# Patient Record
Sex: Male | Born: 1940
Health system: Southern US, Community
[De-identification: ages and names within clinical notes are randomized; demographics above are authoritative.]

## PROBLEM LIST (undated history)

## (undated) DIAGNOSIS — I351 Nonrheumatic aortic (valve) insufficiency: Secondary | ICD-10-CM

## (undated) DIAGNOSIS — I3139 Other pericardial effusion (noninflammatory): Secondary | ICD-10-CM

## (undated) DIAGNOSIS — I519 Heart disease, unspecified: Secondary | ICD-10-CM

## (undated) DIAGNOSIS — R9430 Abnormal result of cardiovascular function study, unspecified: Secondary | ICD-10-CM

## (undated) DIAGNOSIS — I428 Other cardiomyopathies: Secondary | ICD-10-CM

## (undated) DIAGNOSIS — I272 Pulmonary hypertension, unspecified: Secondary | ICD-10-CM

## (undated) DIAGNOSIS — I5022 Chronic systolic (congestive) heart failure: Secondary | ICD-10-CM

## (undated) DIAGNOSIS — IMO0002 Reserved for concepts with insufficient information to code with codable children: Secondary | ICD-10-CM

## (undated) DIAGNOSIS — I452 Bifascicular block: Secondary | ICD-10-CM

## (undated) DIAGNOSIS — I340 Nonrheumatic mitral (valve) insufficiency: Secondary | ICD-10-CM

## (undated) DIAGNOSIS — E119 Type 2 diabetes mellitus without complications: Secondary | ICD-10-CM

## (undated) DIAGNOSIS — E785 Hyperlipidemia, unspecified: Secondary | ICD-10-CM

## (undated) DIAGNOSIS — I77810 Thoracic aortic ectasia: Secondary | ICD-10-CM

## (undated) DIAGNOSIS — N183 Chronic kidney disease, stage 3 (moderate): Secondary | ICD-10-CM

## (undated) DIAGNOSIS — D696 Thrombocytopenia, unspecified: Secondary | ICD-10-CM

## (undated) DIAGNOSIS — I493 Ventricular premature depolarization: Secondary | ICD-10-CM

## (undated) DIAGNOSIS — I313 Pericardial effusion (noninflammatory): Secondary | ICD-10-CM

## (undated) HISTORY — DX: Other pericardial effusion (noninflammatory): I31.39

## (undated) HISTORY — DX: Nonrheumatic aortic (valve) insufficiency: I35.1

## (undated) HISTORY — DX: Ventricular premature depolarization: I49.3

## (undated) HISTORY — DX: Hyperlipidemia, unspecified: E78.5

## (undated) HISTORY — DX: Thrombocytopenia, unspecified: D69.6

## (undated) HISTORY — DX: Heart disease, unspecified: I51.9

## (undated) HISTORY — DX: Bifascicular block: I45.2

## (undated) HISTORY — DX: Gilbert syndrome: E80.4

## (undated) HISTORY — DX: Thoracic aortic ectasia: I77.810

## (undated) HISTORY — DX: Pericardial effusion (noninflammatory): I31.3

## (undated) HISTORY — DX: Nonrheumatic mitral (valve) insufficiency: I34.0

## (undated) HISTORY — DX: Reserved for concepts with insufficient information to code with codable children: IMO0002

## (undated) HISTORY — DX: Type 2 diabetes mellitus without complications: E11.9

## (undated) HISTORY — DX: Abnormal result of cardiovascular function study, unspecified: R94.30

---

## 1997-07-17 ENCOUNTER — Other Ambulatory Visit: Admission: RE | Admit: 1997-07-17 | Discharge: 1997-07-17 | Payer: Self-pay | Admitting: *Deleted

## 1997-09-10 ENCOUNTER — Ambulatory Visit (HOSPITAL_BASED_OUTPATIENT_CLINIC_OR_DEPARTMENT_OTHER): Admission: RE | Admit: 1997-09-10 | Discharge: 1997-09-10 | Payer: Self-pay | Admitting: Urology

## 1999-01-27 ENCOUNTER — Ambulatory Visit (HOSPITAL_COMMUNITY): Admission: RE | Admit: 1999-01-27 | Discharge: 1999-01-27 | Payer: Self-pay | Admitting: Cardiology

## 1999-09-02 ENCOUNTER — Encounter: Payer: Self-pay | Admitting: Rheumatology

## 1999-09-02 ENCOUNTER — Encounter: Admission: RE | Admit: 1999-09-02 | Discharge: 1999-09-02 | Payer: Self-pay | Admitting: Urology

## 1999-09-26 ENCOUNTER — Emergency Department (HOSPITAL_COMMUNITY): Admission: EM | Admit: 1999-09-26 | Discharge: 1999-09-26 | Payer: Self-pay | Admitting: Emergency Medicine

## 1999-09-26 ENCOUNTER — Encounter: Payer: Self-pay | Admitting: Emergency Medicine

## 2000-10-07 ENCOUNTER — Emergency Department (HOSPITAL_COMMUNITY): Admission: EM | Admit: 2000-10-07 | Discharge: 2000-10-07 | Payer: Self-pay | Admitting: Emergency Medicine

## 2000-10-07 ENCOUNTER — Encounter: Payer: Self-pay | Admitting: Emergency Medicine

## 2000-10-15 ENCOUNTER — Emergency Department (HOSPITAL_COMMUNITY): Admission: EM | Admit: 2000-10-15 | Discharge: 2000-10-15 | Payer: Self-pay | Admitting: Physical Therapy

## 2002-11-05 ENCOUNTER — Emergency Department (HOSPITAL_COMMUNITY): Admission: EM | Admit: 2002-11-05 | Discharge: 2002-11-05 | Payer: Self-pay | Admitting: Emergency Medicine

## 2002-11-24 ENCOUNTER — Encounter: Payer: Self-pay | Admitting: Urology

## 2002-11-28 ENCOUNTER — Inpatient Hospital Stay (HOSPITAL_COMMUNITY): Admission: RE | Admit: 2002-11-28 | Discharge: 2002-11-29 | Payer: Self-pay | Admitting: Urology

## 2003-05-14 ENCOUNTER — Encounter: Admission: RE | Admit: 2003-05-14 | Discharge: 2003-08-12 | Payer: Self-pay | Admitting: Rheumatology

## 2003-12-05 ENCOUNTER — Ambulatory Visit (HOSPITAL_COMMUNITY): Admission: RE | Admit: 2003-12-05 | Discharge: 2003-12-05 | Payer: Self-pay | Admitting: Gastroenterology

## 2011-05-21 DIAGNOSIS — Z79899 Other long term (current) drug therapy: Secondary | ICD-10-CM | POA: Diagnosis not present

## 2011-06-10 DIAGNOSIS — N453 Epididymo-orchitis: Secondary | ICD-10-CM | POA: Diagnosis not present

## 2011-06-10 DIAGNOSIS — N401 Enlarged prostate with lower urinary tract symptoms: Secondary | ICD-10-CM | POA: Diagnosis not present

## 2011-06-10 DIAGNOSIS — R300 Dysuria: Secondary | ICD-10-CM | POA: Diagnosis not present

## 2011-06-10 DIAGNOSIS — N3000 Acute cystitis without hematuria: Secondary | ICD-10-CM | POA: Diagnosis not present

## 2011-06-24 DIAGNOSIS — E7800 Pure hypercholesterolemia, unspecified: Secondary | ICD-10-CM | POA: Diagnosis not present

## 2011-06-24 DIAGNOSIS — I4949 Other premature depolarization: Secondary | ICD-10-CM | POA: Diagnosis not present

## 2011-06-24 DIAGNOSIS — I428 Other cardiomyopathies: Secondary | ICD-10-CM | POA: Diagnosis not present

## 2011-06-24 DIAGNOSIS — I10 Essential (primary) hypertension: Secondary | ICD-10-CM | POA: Diagnosis not present

## 2011-07-15 DIAGNOSIS — N453 Epididymo-orchitis: Secondary | ICD-10-CM | POA: Diagnosis not present

## 2011-09-18 DIAGNOSIS — Z79899 Other long term (current) drug therapy: Secondary | ICD-10-CM | POA: Diagnosis not present

## 2012-01-04 DIAGNOSIS — E7800 Pure hypercholesterolemia, unspecified: Secondary | ICD-10-CM | POA: Diagnosis not present

## 2012-01-04 DIAGNOSIS — Z1331 Encounter for screening for depression: Secondary | ICD-10-CM | POA: Diagnosis not present

## 2012-01-04 DIAGNOSIS — Z23 Encounter for immunization: Secondary | ICD-10-CM | POA: Diagnosis not present

## 2012-01-04 DIAGNOSIS — Z79899 Other long term (current) drug therapy: Secondary | ICD-10-CM | POA: Diagnosis not present

## 2012-01-04 DIAGNOSIS — I10 Essential (primary) hypertension: Secondary | ICD-10-CM | POA: Diagnosis not present

## 2012-01-27 DIAGNOSIS — I10 Essential (primary) hypertension: Secondary | ICD-10-CM | POA: Diagnosis not present

## 2012-01-27 DIAGNOSIS — E119 Type 2 diabetes mellitus without complications: Secondary | ICD-10-CM | POA: Diagnosis not present

## 2012-02-15 DIAGNOSIS — E119 Type 2 diabetes mellitus without complications: Secondary | ICD-10-CM | POA: Diagnosis not present

## 2012-02-15 DIAGNOSIS — I10 Essential (primary) hypertension: Secondary | ICD-10-CM | POA: Diagnosis not present

## 2012-03-01 DIAGNOSIS — N529 Male erectile dysfunction, unspecified: Secondary | ICD-10-CM | POA: Diagnosis not present

## 2012-03-01 DIAGNOSIS — E119 Type 2 diabetes mellitus without complications: Secondary | ICD-10-CM | POA: Diagnosis not present

## 2012-03-01 DIAGNOSIS — I10 Essential (primary) hypertension: Secondary | ICD-10-CM | POA: Diagnosis not present

## 2012-06-21 DIAGNOSIS — I10 Essential (primary) hypertension: Secondary | ICD-10-CM | POA: Diagnosis not present

## 2012-06-21 DIAGNOSIS — Z79899 Other long term (current) drug therapy: Secondary | ICD-10-CM | POA: Diagnosis not present

## 2012-06-21 DIAGNOSIS — E119 Type 2 diabetes mellitus without complications: Secondary | ICD-10-CM | POA: Diagnosis not present

## 2012-06-21 DIAGNOSIS — E782 Mixed hyperlipidemia: Secondary | ICD-10-CM | POA: Diagnosis not present

## 2012-06-22 DIAGNOSIS — I10 Essential (primary) hypertension: Secondary | ICD-10-CM | POA: Diagnosis not present

## 2012-06-22 DIAGNOSIS — I452 Bifascicular block: Secondary | ICD-10-CM | POA: Diagnosis not present

## 2012-06-22 DIAGNOSIS — I428 Other cardiomyopathies: Secondary | ICD-10-CM | POA: Diagnosis not present

## 2012-06-22 DIAGNOSIS — E7800 Pure hypercholesterolemia, unspecified: Secondary | ICD-10-CM | POA: Diagnosis not present

## 2012-06-22 DIAGNOSIS — I4949 Other premature depolarization: Secondary | ICD-10-CM | POA: Diagnosis not present

## 2012-07-19 DIAGNOSIS — N320 Bladder-neck obstruction: Secondary | ICD-10-CM | POA: Diagnosis not present

## 2012-08-11 DIAGNOSIS — A5900 Urogenital trichomoniasis, unspecified: Secondary | ICD-10-CM | POA: Diagnosis not present

## 2012-08-18 DIAGNOSIS — A64 Unspecified sexually transmitted disease: Secondary | ICD-10-CM | POA: Diagnosis not present

## 2012-08-18 DIAGNOSIS — A568 Sexually transmitted chlamydial infection of other sites: Secondary | ICD-10-CM | POA: Diagnosis not present

## 2012-08-18 DIAGNOSIS — A539 Syphilis, unspecified: Secondary | ICD-10-CM | POA: Diagnosis not present

## 2012-08-18 DIAGNOSIS — Z202 Contact with and (suspected) exposure to infections with a predominantly sexual mode of transmission: Secondary | ICD-10-CM | POA: Diagnosis not present

## 2012-08-22 DIAGNOSIS — Z202 Contact with and (suspected) exposure to infections with a predominantly sexual mode of transmission: Secondary | ICD-10-CM | POA: Diagnosis not present

## 2012-08-22 DIAGNOSIS — A64 Unspecified sexually transmitted disease: Secondary | ICD-10-CM | POA: Diagnosis not present

## 2012-09-01 DIAGNOSIS — A64 Unspecified sexually transmitted disease: Secondary | ICD-10-CM | POA: Diagnosis not present

## 2012-12-28 DIAGNOSIS — Z79899 Other long term (current) drug therapy: Secondary | ICD-10-CM | POA: Diagnosis not present

## 2012-12-28 DIAGNOSIS — Z0000 Encounter for general adult medical examination without abnormal findings: Secondary | ICD-10-CM | POA: Diagnosis not present

## 2012-12-28 DIAGNOSIS — Z1331 Encounter for screening for depression: Secondary | ICD-10-CM | POA: Diagnosis not present

## 2012-12-28 DIAGNOSIS — E782 Mixed hyperlipidemia: Secondary | ICD-10-CM | POA: Diagnosis not present

## 2012-12-28 DIAGNOSIS — R011 Cardiac murmur, unspecified: Secondary | ICD-10-CM | POA: Diagnosis not present

## 2012-12-28 DIAGNOSIS — I10 Essential (primary) hypertension: Secondary | ICD-10-CM | POA: Diagnosis not present

## 2012-12-28 DIAGNOSIS — E119 Type 2 diabetes mellitus without complications: Secondary | ICD-10-CM | POA: Diagnosis not present

## 2013-01-03 DIAGNOSIS — R011 Cardiac murmur, unspecified: Secondary | ICD-10-CM | POA: Diagnosis not present

## 2013-02-01 ENCOUNTER — Encounter (HOSPITAL_COMMUNITY): Payer: Self-pay | Admitting: Cardiology

## 2013-02-13 ENCOUNTER — Encounter: Payer: Self-pay | Admitting: Cardiology

## 2013-02-13 DIAGNOSIS — Z23 Encounter for immunization: Secondary | ICD-10-CM | POA: Diagnosis not present

## 2013-02-13 DIAGNOSIS — M5430 Sciatica, unspecified side: Secondary | ICD-10-CM | POA: Diagnosis not present

## 2013-04-11 DIAGNOSIS — N433 Hydrocele, unspecified: Secondary | ICD-10-CM | POA: Diagnosis not present

## 2013-04-11 DIAGNOSIS — Z202 Contact with and (suspected) exposure to infections with a predominantly sexual mode of transmission: Secondary | ICD-10-CM | POA: Diagnosis not present

## 2013-05-12 DIAGNOSIS — N509 Disorder of male genital organs, unspecified: Secondary | ICD-10-CM | POA: Diagnosis not present

## 2013-06-21 ENCOUNTER — Ambulatory Visit: Payer: Self-pay | Admitting: Cardiology

## 2013-08-14 DIAGNOSIS — I359 Nonrheumatic aortic valve disorder, unspecified: Secondary | ICD-10-CM | POA: Diagnosis not present

## 2013-08-14 DIAGNOSIS — I10 Essential (primary) hypertension: Secondary | ICD-10-CM | POA: Diagnosis not present

## 2013-08-14 DIAGNOSIS — E782 Mixed hyperlipidemia: Secondary | ICD-10-CM | POA: Diagnosis not present

## 2013-08-14 DIAGNOSIS — Z79899 Other long term (current) drug therapy: Secondary | ICD-10-CM | POA: Diagnosis not present

## 2013-08-14 DIAGNOSIS — E119 Type 2 diabetes mellitus without complications: Secondary | ICD-10-CM | POA: Diagnosis not present

## 2014-02-22 ENCOUNTER — Ambulatory Visit: Payer: Self-pay | Admitting: Cardiology

## 2014-02-22 DIAGNOSIS — Z1389 Encounter for screening for other disorder: Secondary | ICD-10-CM | POA: Diagnosis not present

## 2014-02-22 DIAGNOSIS — E119 Type 2 diabetes mellitus without complications: Secondary | ICD-10-CM | POA: Diagnosis not present

## 2014-02-22 DIAGNOSIS — Z79899 Other long term (current) drug therapy: Secondary | ICD-10-CM | POA: Diagnosis not present

## 2014-02-22 DIAGNOSIS — D696 Thrombocytopenia, unspecified: Secondary | ICD-10-CM | POA: Diagnosis not present

## 2014-02-22 DIAGNOSIS — Z0000 Encounter for general adult medical examination without abnormal findings: Secondary | ICD-10-CM | POA: Diagnosis not present

## 2014-02-22 DIAGNOSIS — E780 Pure hypercholesterolemia: Secondary | ICD-10-CM | POA: Diagnosis not present

## 2014-02-22 DIAGNOSIS — Z23 Encounter for immunization: Secondary | ICD-10-CM | POA: Diagnosis not present

## 2014-02-22 DIAGNOSIS — I351 Nonrheumatic aortic (valve) insufficiency: Secondary | ICD-10-CM | POA: Diagnosis not present

## 2014-02-22 DIAGNOSIS — I10 Essential (primary) hypertension: Secondary | ICD-10-CM | POA: Diagnosis not present

## 2014-03-05 ENCOUNTER — Ambulatory Visit (INDEPENDENT_AMBULATORY_CARE_PROVIDER_SITE_OTHER): Payer: Medicare Other | Admitting: Cardiology

## 2014-03-05 ENCOUNTER — Encounter: Payer: Self-pay | Admitting: Cardiology

## 2014-03-05 VITALS — BP 140/92 | HR 63 | Ht 71.0 in | Wt 190.0 lb

## 2014-03-05 DIAGNOSIS — I4510 Unspecified right bundle-branch block: Secondary | ICD-10-CM

## 2014-03-05 DIAGNOSIS — E785 Hyperlipidemia, unspecified: Secondary | ICD-10-CM

## 2014-03-05 DIAGNOSIS — I77810 Thoracic aortic ectasia: Secondary | ICD-10-CM | POA: Diagnosis not present

## 2014-03-05 DIAGNOSIS — I493 Ventricular premature depolarization: Secondary | ICD-10-CM

## 2014-03-05 DIAGNOSIS — I10 Essential (primary) hypertension: Secondary | ICD-10-CM

## 2014-03-05 DIAGNOSIS — I452 Bifascicular block: Secondary | ICD-10-CM

## 2014-03-05 DIAGNOSIS — I445 Left posterior fascicular block: Secondary | ICD-10-CM

## 2014-03-05 DIAGNOSIS — I351 Nonrheumatic aortic (valve) insufficiency: Secondary | ICD-10-CM

## 2014-03-05 NOTE — Patient Instructions (Signed)
The current medical regimen is effective;  continue present plan and medications.  Your physician has requested that you have an echocardiogram. Echocardiography is a painless test that uses sound waves to create images of your heart. It provides your doctor with information about the size and shape of your heart and how well your heart's chambers and valves are working. This procedure takes approximately one hour. There are no restrictions for this procedure.  Follow up in 1 year with Dr. Marlou Porch.  You will receive a letter in the mail 2 months before you are due.  Please call us when you receive this letter to schedule your follow up appointment.  Thank you for choosing [facility]!!

## 2014-03-05 NOTE — Progress Notes (Signed)
[place]. [address], [address] [place], [place]  [address] Phone: [phone] Fax:  [phone]  Date:  03/05/2014   ID:  WARFIELD FICKE, DOB [birth year], MRN [id number]  PCP:  [name], MD   History of Present Illness: Carlos Gibson is a 73 y.o. male with diabetes, hyperlipidemia, history of frequent PVCs with mild to moderate aortic insufficiency here for follow-up. Echocardiogram performed on 01/03/13 demonstrated ejection fraction of 50-55% with mildly calcified aortic valve and mild to moderate aortic valve regurgitation with aortic root dilatation of 4 cm. Mild diastolic dysfunction was noted. He has been diagnosed in the past with Gilbert's syndrome and mild thrombocytopenia. He also has a bifascicular block, right bundle branch block and left posterior fascicular block.  Ejection fraction by MUGA on 07/19/08 was 55-60%.  Worked prior at [employer]. Feels PVC, rare. No syncope.    Wt Readings from Last 3 Encounters:  03/05/14 190 lb (86.183 kg)     No past medical history on file.  No past surgical history on file.  Current Outpatient Prescriptions  Medication Sig Dispense Refill  . alfuzosin (UROXATRAL) 10 MG 24 hr tablet Take 10 mg by mouth daily.  3  . atorvastatin (LIPITOR) 20 MG tablet Take 20 mg by mouth daily.  8  . finasteride (PROSCAR) 5 MG tablet   2  . lisinopril (PRINIVIL,ZESTRIL) 5 MG tablet     . metFORMIN (GLUCOPHAGE) 500 MG tablet Take 1,000 mg by mouth 2 (two) times daily.  3   No current facility-administered medications for this visit.    Allergies:   No Known Allergies  Social History:  The patient  reports that he has never smoked. He does not have any smokeless tobacco history on file.   Family History  Problem Relation Age of Onset  . Hypertension Mother   . Irregular heart beat Father     ROS:  Please see the history of present illness.   Denies any fevers, chills, orthopnea, PND. Positive for occasional palpitations. No  shortness of breath.   All other systems reviewed and negative.   PHYSICAL EXAM: VS:  BP 140/92 mmHg  Pulse 63  Ht 5\' 11"  (1.803 m)  Wt 190 lb (86.183 kg)  BMI 26.51 kg/m2 Well nourished, well developed, in no acute distress HEENT: normal, /AT, EOMI Neck: no JVD, normal carotid upstroke, no bruit Cardiac:  normal S1, S2; RRR; 1/6 diastolic murmurRight upper sternal border Lungs:  clear to auscultation bilaterally, no wheezing, rhonchi or rales Abd: soft, nontender, no hepatomegaly, no bruits Ext: no edema, 2+ distal pulses Skin: warm and dry GU: deferred Neuro: no focal abnormalities noted, AAO x 3  EKG:  03/05/14-sinus rhythm, 63, right bundle branch block, left posterior fascicular block (negative axis lead 1, positive aVR, positive inferior leads, nonspecific ST-T wave changes.  ASSESSMENT AND PLAN:  1. Aortic regurgitation-previously in the moderate range. Diastolic murmur heard today on exam. No symptoms of significant shortness of breath. Occasional atypical sharp chest discomfort. I will check an echocardiogram to monitor his aortic valve regurgitation. 2. Dilated aortic root-previously 4 cm. Checking echocardiogram. Continuing with blood pressure control. 3. Hypertension-mildly elevated today however last week with Dr. Felipa Eth was within normal range he states. Continue with low-dose ACE inhibitor. Monitor. 4. PVC-heard today on exam. Ectopy. Asymptomatic except for occasional palpitations felt at night when quiet. Overall, no high risk symptoms. 5. Hyperlipidemia-tolerating statin medication well. Stated that Dr. Felipa Eth noted excellent lipids. 6. Right bundle  branch block/left posterior fascicular block-watch for any signs of syncope. Be careful with AV nodal blocking agents.  Signed, [name], MD [facility]  03/05/2014 8:50 AM

## 2014-03-08 ENCOUNTER — Ambulatory Visit (HOSPITAL_COMMUNITY): Payer: Medicare Other | Attending: Cardiology

## 2014-03-08 ENCOUNTER — Other Ambulatory Visit: Payer: Self-pay

## 2014-03-08 DIAGNOSIS — I359 Nonrheumatic aortic valve disorder, unspecified: Secondary | ICD-10-CM | POA: Diagnosis not present

## 2014-03-08 DIAGNOSIS — I351 Nonrheumatic aortic (valve) insufficiency: Secondary | ICD-10-CM

## 2014-03-08 DIAGNOSIS — I10 Essential (primary) hypertension: Secondary | ICD-10-CM | POA: Diagnosis not present

## 2014-03-08 DIAGNOSIS — E785 Hyperlipidemia, unspecified: Secondary | ICD-10-CM | POA: Insufficient documentation

## 2014-03-08 NOTE — Progress Notes (Signed)
2D Echo completed. 03/08/2014 

## 2014-03-13 ENCOUNTER — Telehealth: Payer: Self-pay | Admitting: Cardiology

## 2014-03-13 NOTE — Telephone Encounter (Signed)
New problem   Pt returning call concerning results. Please call pt.

## 2014-03-13 NOTE — Telephone Encounter (Signed)
Pt given results of echo done 03/08/14

## 2014-04-09 DIAGNOSIS — H18413 Arcus senilis, bilateral: Secondary | ICD-10-CM | POA: Diagnosis not present

## 2014-04-09 DIAGNOSIS — E119 Type 2 diabetes mellitus without complications: Secondary | ICD-10-CM | POA: Diagnosis not present

## 2014-04-09 DIAGNOSIS — H43811 Vitreous degeneration, right eye: Secondary | ICD-10-CM | POA: Diagnosis not present

## 2014-04-09 DIAGNOSIS — H2513 Age-related nuclear cataract, bilateral: Secondary | ICD-10-CM | POA: Diagnosis not present

## 2014-06-12 DIAGNOSIS — R3913 Splitting of urinary stream: Secondary | ICD-10-CM | POA: Diagnosis not present

## 2014-06-12 DIAGNOSIS — R351 Nocturia: Secondary | ICD-10-CM | POA: Diagnosis not present

## 2014-06-12 DIAGNOSIS — N401 Enlarged prostate with lower urinary tract symptoms: Secondary | ICD-10-CM | POA: Diagnosis not present

## 2014-08-23 DIAGNOSIS — N400 Enlarged prostate without lower urinary tract symptoms: Secondary | ICD-10-CM | POA: Diagnosis not present

## 2014-08-23 DIAGNOSIS — D696 Thrombocytopenia, unspecified: Secondary | ICD-10-CM | POA: Diagnosis not present

## 2014-08-23 DIAGNOSIS — E780 Pure hypercholesterolemia: Secondary | ICD-10-CM | POA: Diagnosis not present

## 2014-08-23 DIAGNOSIS — I10 Essential (primary) hypertension: Secondary | ICD-10-CM | POA: Diagnosis not present

## 2014-08-23 DIAGNOSIS — Z79899 Other long term (current) drug therapy: Secondary | ICD-10-CM | POA: Diagnosis not present

## 2014-08-23 DIAGNOSIS — E119 Type 2 diabetes mellitus without complications: Secondary | ICD-10-CM | POA: Diagnosis not present

## 2014-08-23 DIAGNOSIS — R002 Palpitations: Secondary | ICD-10-CM | POA: Diagnosis not present

## 2014-08-27 DIAGNOSIS — Z79899 Other long term (current) drug therapy: Secondary | ICD-10-CM | POA: Diagnosis not present

## 2014-08-27 DIAGNOSIS — D696 Thrombocytopenia, unspecified: Secondary | ICD-10-CM | POA: Diagnosis not present

## 2014-08-27 DIAGNOSIS — E119 Type 2 diabetes mellitus without complications: Secondary | ICD-10-CM | POA: Diagnosis not present

## 2014-08-30 DIAGNOSIS — Z113 Encounter for screening for infections with a predominantly sexual mode of transmission: Secondary | ICD-10-CM | POA: Diagnosis not present

## 2014-08-30 DIAGNOSIS — L245 Irritant contact dermatitis due to other chemical products: Secondary | ICD-10-CM | POA: Diagnosis not present

## 2014-08-30 DIAGNOSIS — Z202 Contact with and (suspected) exposure to infections with a predominantly sexual mode of transmission: Secondary | ICD-10-CM | POA: Diagnosis not present

## 2014-09-07 DIAGNOSIS — L245 Irritant contact dermatitis due to other chemical products: Secondary | ICD-10-CM | POA: Diagnosis not present

## 2014-10-03 DIAGNOSIS — I10 Essential (primary) hypertension: Secondary | ICD-10-CM | POA: Diagnosis not present

## 2014-10-03 DIAGNOSIS — I351 Nonrheumatic aortic (valve) insufficiency: Secondary | ICD-10-CM | POA: Diagnosis not present

## 2014-10-23 ENCOUNTER — Other Ambulatory Visit: Payer: Self-pay | Admitting: Geriatric Medicine

## 2014-10-23 ENCOUNTER — Ambulatory Visit (HOSPITAL_COMMUNITY): Payer: Medicare Other | Attending: Cardiology

## 2014-10-23 ENCOUNTER — Other Ambulatory Visit: Payer: Self-pay

## 2014-10-23 DIAGNOSIS — I313 Pericardial effusion (noninflammatory): Secondary | ICD-10-CM | POA: Diagnosis not present

## 2014-10-23 DIAGNOSIS — I340 Nonrheumatic mitral (valve) insufficiency: Secondary | ICD-10-CM | POA: Insufficient documentation

## 2014-10-23 DIAGNOSIS — I351 Nonrheumatic aortic (valve) insufficiency: Secondary | ICD-10-CM | POA: Insufficient documentation

## 2014-10-24 ENCOUNTER — Ambulatory Visit
Admission: RE | Admit: 2014-10-24 | Discharge: 2014-10-24 | Disposition: A | Discharge status: Home / Self Care | Payer: Medicare Other | Source: Ambulatory Visit | Attending: Internal Medicine | Admitting: Internal Medicine

## 2014-10-24 ENCOUNTER — Other Ambulatory Visit: Payer: Self-pay | Admitting: Internal Medicine

## 2014-10-24 DIAGNOSIS — R0609 Other forms of dyspnea: Secondary | ICD-10-CM | POA: Diagnosis not present

## 2014-10-24 DIAGNOSIS — R0602 Shortness of breath: Secondary | ICD-10-CM | POA: Diagnosis not present

## 2014-10-24 DIAGNOSIS — R05 Cough: Secondary | ICD-10-CM | POA: Diagnosis not present

## 2014-11-05 DIAGNOSIS — I10 Essential (primary) hypertension: Secondary | ICD-10-CM | POA: Diagnosis not present

## 2014-11-05 DIAGNOSIS — I351 Nonrheumatic aortic (valve) insufficiency: Secondary | ICD-10-CM | POA: Diagnosis not present

## 2014-11-05 DIAGNOSIS — R0600 Dyspnea, unspecified: Secondary | ICD-10-CM | POA: Diagnosis not present

## 2014-11-05 DIAGNOSIS — I509 Heart failure, unspecified: Secondary | ICD-10-CM | POA: Diagnosis not present

## 2014-11-23 DIAGNOSIS — I5021 Acute systolic (congestive) heart failure: Secondary | ICD-10-CM | POA: Diagnosis not present

## 2014-11-23 DIAGNOSIS — E119 Type 2 diabetes mellitus without complications: Secondary | ICD-10-CM | POA: Diagnosis not present

## 2014-11-23 DIAGNOSIS — I10 Essential (primary) hypertension: Secondary | ICD-10-CM | POA: Diagnosis not present

## 2014-11-23 DIAGNOSIS — N400 Enlarged prostate without lower urinary tract symptoms: Secondary | ICD-10-CM | POA: Diagnosis not present

## 2014-11-23 DIAGNOSIS — Z79899 Other long term (current) drug therapy: Secondary | ICD-10-CM | POA: Diagnosis not present

## 2014-12-13 ENCOUNTER — Encounter: Payer: Self-pay | Admitting: *Deleted

## 2014-12-13 ENCOUNTER — Encounter: Payer: Self-pay | Admitting: Cardiology

## 2014-12-13 ENCOUNTER — Ambulatory Visit (INDEPENDENT_AMBULATORY_CARE_PROVIDER_SITE_OTHER): Payer: Medicare Other | Admitting: Cardiology

## 2014-12-13 ENCOUNTER — Telehealth: Payer: Self-pay | Admitting: Cardiology

## 2014-12-13 VITALS — BP 128/90 | HR 98 | Ht 71.0 in | Wt 180.4 lb

## 2014-12-13 DIAGNOSIS — I493 Ventricular premature depolarization: Secondary | ICD-10-CM

## 2014-12-13 DIAGNOSIS — I445 Left posterior fascicular block: Secondary | ICD-10-CM

## 2014-12-13 DIAGNOSIS — Z01818 Encounter for other preprocedural examination: Secondary | ICD-10-CM

## 2014-12-13 DIAGNOSIS — Z79899 Other long term (current) drug therapy: Secondary | ICD-10-CM | POA: Diagnosis not present

## 2014-12-13 DIAGNOSIS — I351 Nonrheumatic aortic (valve) insufficiency: Secondary | ICD-10-CM

## 2014-12-13 DIAGNOSIS — I10 Essential (primary) hypertension: Secondary | ICD-10-CM | POA: Diagnosis not present

## 2014-12-13 DIAGNOSIS — I452 Bifascicular block: Secondary | ICD-10-CM

## 2014-12-13 DIAGNOSIS — I4510 Unspecified right bundle-branch block: Secondary | ICD-10-CM

## 2014-12-13 DIAGNOSIS — I77810 Thoracic aortic ectasia: Secondary | ICD-10-CM | POA: Diagnosis not present

## 2014-12-13 DIAGNOSIS — I444 Left anterior fascicular block: Secondary | ICD-10-CM | POA: Insufficient documentation

## 2014-12-13 LAB — BASIC METABOLIC PANEL
BUN: 15 mg/dL (ref 6–23)
CHLORIDE: 104 meq/L (ref 96–112)
CO2: 29 meq/L (ref 19–32)
Calcium: 9.3 mg/dL (ref 8.4–10.5)
Creatinine, Ser: 1.26 mg/dL (ref 0.40–1.50)
GFR: 71.91 mL/min (ref >60.00)
GLUCOSE: 131 mg/dL — AB (ref 70–99)
Potassium: 3.9 mEq/L (ref 3.5–5.1)
SODIUM: 141 meq/L (ref 135–145)

## 2014-12-13 LAB — PROTIME-INR
INR: 1.1 ratio — AB (ref 0.8–1.0)
Prothrombin Time: 12.4 s (ref 9.6–13.1)

## 2014-12-13 LAB — CBC
HEMATOCRIT: 40.2 % (ref 39.0–52.0)
Hemoglobin: 13.3 g/dL (ref 13.0–17.0)
MCHC: 33 g/dL (ref 30.0–36.0)
MCV: 84.3 fl (ref 78.0–100.0)
Platelets: 129 10*3/uL — ABNORMAL LOW (ref 150.0–400.0)
RBC: 4.77 Mil/uL (ref 4.22–5.81)
RDW: 14.1 % (ref 11.5–15.5)
WBC: 4.2 10*3/uL (ref 4.0–10.5)

## 2014-12-13 MED ORDER — CARVEDILOL 3.125 MG PO TABS: 3.1250 mg | ORAL_TABLET | Freq: Two times a day (BID) | ORAL | Status: DC

## 2014-12-13 MED ORDER — LOSARTAN POTASSIUM 25 MG PO TABS: 25.0000 mg | ORAL_TABLET | Freq: Every day | ORAL | Status: DC

## 2014-12-13 NOTE — Patient Instructions (Signed)
Medication Instructions:  Please stop your Lisinopril. Start Losartan 25 mg once a day and Carvedilol 3.125 mg twice a day. continue all other medications as listed.  Labwork: Please have blood work today (CBC, BMP and PT/INR)  Testing/Procedures: Your physician has requested that you have a cardiac catheterization. Cardiac catheterization is used to diagnose and/or treat various heart conditions. Doctors may recommend this procedure for a number of different reasons. The most common reason is to evaluate chest pain. Chest pain can be a symptom of coronary artery disease (CAD), and cardiac catheterization can show whether plaque is narrowing or blocking your heart's arteries. This procedure is also used to evaluate the valves, as well as measure the blood flow and oxygen levels in different parts of your heart. For further information please visit [email or web address]. Please follow instruction sheet, as given.  Follow-Up: Will be scheduled after your cardiac cath.  Thank you for choosing [facility]!!

## 2014-12-13 NOTE — Addendum Note (Signed)
Addended by: Jerline Pain on: 12/13/2014 09:42 AM   Modules accepted: Orders

## 2014-12-13 NOTE — Telephone Encounter (Signed)
New message      Pt was seen today and forgot to ask if he should continue taking lasix.  Please call

## 2014-12-13 NOTE — Telephone Encounter (Signed)
PT  AWARE./CY 

## 2014-12-13 NOTE — Progress Notes (Addendum)
[place]. [address] [place], [place]  [address] Phone: [phone] Fax:  [phone]  Date:  12/13/2014   ID:  Carlos Gibson, DOB Dec 07, 1940, MRN [id number]  PCP:  [name], MD   History of Present Illness: Carlos Gibson is a 74 y.o. male with diabetes, hyperlipidemia, history of frequent PVCs with mild to moderate aortic insufficiency here for follow-up and discovery of her ejection fraction 40% on echocardiogram 10/23/14. Previous Echocardiogram performed on 01/03/13 demonstrated ejection fraction of 50-55% with mildly calcified aortic valve and mild to moderate aortic valve regurgitation with aortic root dilatation of 4 cm. Mild diastolic dysfunction was noted.  Cough started again after restarting lisinopril.  Wheezing if lay on side. No problem laying flat.  No CP.   He has been diagnosed in the past with Gilbert's syndrome and mild thrombocytopenia. He also has a bifascicular block, right bundle branch block and left posterior fascicular block.  Ejection fraction by MUGA on 07/19/08 was 55-60%.  Worked prior at [employer]. Feels PVC, rare. No syncope.   ECHO 10/23/14:  - Left ventricle: The cavity size was severely dilated. Wall thickness was increased in a pattern of mild LVH. Systolic function was moderately reduced. The estimated ejection fraction was 40%. - Aortic valve: There was mild regurgitation. - Mitral valve: There was mild to moderate regurgitation. - Left atrium: The atrium was severely dilated. - Right atrium: The atrium was mildly dilated. - Pulmonary arteries: PA peak pressure: 60 mm Hg (S). - Pericardium, extracardiac: A trivial pericardial effusion was identified.   Wt Readings from Last 3 Encounters:  12/13/14 180 lb 6.4 oz (81.829 kg)  03/05/14 190 lb (86.183 kg)     No past medical history on file.  No past surgical history on file.  Current Outpatient Prescriptions  Medication Sig Dispense Refill  . alfuzosin  (UROXATRAL) 10 MG 24 hr tablet Take 10 mg by mouth daily.  3  . aspirin 81 MG tablet Take 81 mg by mouth daily.    [name] atorvastatin (LIPITOR) 20 MG tablet Take 20 mg by mouth daily.  8  . calcium-vitamin D (OSCAL WITH D) 500-200 MG-UNIT per tablet Take 1 tablet by mouth.    . finasteride (PROSCAR) 5 MG tablet   2  . lisinopril (PRINIVIL,ZESTRIL) 5 MG tablet     . metFORMIN (GLUCOPHAGE) 500 MG tablet Take 1,000 mg by mouth 2 (two) times daily.  3   No current facility-administered medications for this visit.    Allergies:   No Known Allergies  Social History:  The patient  reports that he has never smoked. He does not have any smokeless tobacco history on file.   Family History  Problem Relation Age of Onset  . Hypertension Mother   . Irregular heart beat Father     ROS:  Please see the history of present illness.   Denies any fevers, chills, orthopnea, PND. Positive for occasional palpitations. No shortness of breath.   All other systems reviewed and negative.   PHYSICAL EXAM: VS:  BP 128/90 mmHg  Pulse 98  Ht 5\' 11"  (1.803 m)  Wt 180 lb 6.4 oz (81.829 kg)  BMI 25.17 kg/m2  SpO2 96% Well nourished, well developed, in no acute distress HEENT: normal, [place]/AT, EOMI Neck: no JVD, normal carotid upstroke, no bruit Cardiac:  normal S1, S2; RRR; 1/6 diastolic murmurRight upper sternal border Lungs:  clear to auscultation bilaterally, no wheezing, rhonchi or rales Abd: soft, nontender,  no hepatomegaly, no bruits Ext: no edema, 2+ distal pulses, 2+ radial pulse Skin: warm and dry GU: deferred Neuro: no focal abnormalities noted, AAO x 3  EKG:  08/23/14-sinus rhythm with PACs, PVCs, left anterior fascicular block, right bundle branch block, nonspecific ST-T wave changes-personally viewed-03/05/14-sinus rhythm, 63, right bundle branch block, left posterior fascicular block (negative axis lead 1, positive aVR, positive inferior leads, nonspecific ST-T wave changes.  ASSESSMENT AND  PLAN:  1. Cardiomyopathy, dilated, acute systolic heart failure - EF now 40% in 7/16. 8 pound fluid gain, nocturnal dyspnea, cough, BNP was elevated at 1410. After Lasix administration, he had improvement of his symptoms according to Dr. Carlyle Lipa note. No symptoms of chest pain. His lisinopril was added back and cough returned. I will go ahead and stop the lisinopril and start losartan 25 g once a day. I will also add carvedilol 3.125 mg twice a day as a beta blocker given his cardiomyopathy. In the past, metoprolol had caused fatigue. In addition, given his reduction in ejection fraction, I will check cardiac catheterization, radial/brachial right and left heart catheterization to exclude coronary artery disease. He is not having any chest discomfort. For now, continue with Lasix 20 mg once a day. Risk of stroke, heart attack, death, renal impairment, bleeding were discussed. Patient wife present for discussion. Pulmonary pressures were estimated at 60 mmHg. We will check right heart catheterization.  2. Aortic regurgitation-previously in the moderate range. Diastolic murmur heard on exam. No symptoms of significant shortness of breath. Occasional atypical sharp chest discomfort. I will check an echocardiogram to monitor his aortic valve regurgitation. 3. Dilated aortic root-previously 4 cm.  Continuing with blood pressure control. 4. Hypertension-. Monitor. ARB because of ACE inhibitor cough, carvedilol. 5. PVC-Chronic. Ectopy. Asymptomatic except for occasional palpitations felt at night when quiet. Overall, no high risk symptoms. Starting carvedilol. Could this be also contribute into his cardiomyopathy? 6. Hyperlipidemia-tolerating statin medication well.  Dr. Felipa Eth following labs. 7. Right bundle branch block/left posterior fascicular block-watch for any signs of syncope. Be careful with AV nodal blocking agents.  Cath Lab Visit (complete for each Cath Lab visit)  Clinical Evaluation Leading  to the Procedure:   ACS: No.  Non-ACS:    Anginal Classification: CCS II  Anti-ischemic medical therapy: Minimal Therapy (1 class of medications)  Non-Invasive Test Results: No non-invasive testing performed  Prior CABG: No previous CABG    Signed, [name], MD [facility]  12/13/2014 9:03 AM

## 2014-12-13 NOTE — Telephone Encounter (Signed)
OK to continue - labs OK today

## 2014-12-20 ENCOUNTER — Ambulatory Visit (HOSPITAL_COMMUNITY)
Admission: RE | Admit: 2014-12-20 | Discharge: 2014-12-20 | Disposition: A | Discharge status: Home / Self Care | Payer: Medicare Other | Source: Ambulatory Visit | Attending: Cardiology | Admitting: Cardiology

## 2014-12-20 ENCOUNTER — Encounter (HOSPITAL_COMMUNITY)
Admission: RE | Disposition: A | Discharge status: Home / Self Care | Payer: Self-pay | Source: Ambulatory Visit | Attending: Cardiology

## 2014-12-20 DIAGNOSIS — I452 Bifascicular block: Secondary | ICD-10-CM | POA: Diagnosis not present

## 2014-12-20 DIAGNOSIS — I5023 Acute on chronic systolic (congestive) heart failure: Secondary | ICD-10-CM | POA: Diagnosis present

## 2014-12-20 DIAGNOSIS — I5021 Acute systolic (congestive) heart failure: Secondary | ICD-10-CM | POA: Diagnosis not present

## 2014-12-20 DIAGNOSIS — I10 Essential (primary) hypertension: Secondary | ICD-10-CM | POA: Insufficient documentation

## 2014-12-20 DIAGNOSIS — I429 Cardiomyopathy, unspecified: Secondary | ICD-10-CM

## 2014-12-20 DIAGNOSIS — E119 Type 2 diabetes mellitus without complications: Secondary | ICD-10-CM | POA: Insufficient documentation

## 2014-12-20 DIAGNOSIS — Z79899 Other long term (current) drug therapy: Secondary | ICD-10-CM

## 2014-12-20 DIAGNOSIS — E785 Hyperlipidemia, unspecified: Secondary | ICD-10-CM | POA: Insufficient documentation

## 2014-12-20 DIAGNOSIS — I351 Nonrheumatic aortic (valve) insufficiency: Secondary | ICD-10-CM | POA: Diagnosis not present

## 2014-12-20 DIAGNOSIS — I77810 Thoracic aortic ectasia: Secondary | ICD-10-CM | POA: Diagnosis present

## 2014-12-20 HISTORY — PX: CARDIAC CATHETERIZATION: SHX172

## 2014-12-20 LAB — POCT I-STAT 3, VENOUS BLOOD GAS (G3P V)
ACID-BASE DEFICIT: 2 mmol/L (ref 0.0–2.0)
Bicarbonate: 24 mEq/L (ref 20.0–24.0)
O2 Saturation: 58 %
PH VEN: 7.346 — AB (ref 7.250–7.300)
TCO2: 25 mmol/L (ref 0–100)
pCO2, Ven: 43.9 mmHg — ABNORMAL LOW (ref 45.0–50.0)
pO2, Ven: 32 mmHg (ref 30.0–45.0)

## 2014-12-20 LAB — POCT I-STAT 3, ART BLOOD GAS (G3+)
Acid-base deficit: 2 mmol/L (ref 0.0–2.0)
Bicarbonate: 22.4 mEq/L (ref 20.0–24.0)
O2 SAT: 97 %
PCO2 ART: 38.3 mmHg (ref 35.0–45.0)
PO2 ART: 96 mmHg (ref 80.0–100.0)
TCO2: 24 mmol/L (ref 0–100)
pH, Arterial: 7.375 (ref 7.350–7.450)

## 2014-12-20 LAB — GLUCOSE, CAPILLARY
GLUCOSE-CAPILLARY: 161 mg/dL — AB (ref 65–99)
GLUCOSE-CAPILLARY: 132 mg/dL — AB (ref 65–99)

## 2014-12-20 SURGERY — RIGHT/LEFT HEART CATH AND CORONARY ANGIOGRAPHY

## 2014-12-20 MED ORDER — HEPARIN SODIUM (PORCINE) 1000 UNIT/ML IJ SOLN
INTRAMUSCULAR | Status: AC
  Filled 2014-12-20: qty 1

## 2014-12-20 MED ORDER — SODIUM CHLORIDE 0.9 % IV SOLN: 250.0000 mL | INTRAVENOUS | Status: DC | PRN

## 2014-12-20 MED ORDER — FUROSEMIDE 20 MG PO TABS: 20.0000 mg | ORAL_TABLET | Freq: Every day | ORAL | Status: DC

## 2014-12-20 MED ORDER — MIDAZOLAM HCL 2 MG/2ML IJ SOLN
INTRAMUSCULAR | Status: AC
  Filled 2014-12-20: qty 4

## 2014-12-20 MED ORDER — SODIUM CHLORIDE 0.9 % IV SOLN
INTRAVENOUS | Status: DC
  Administered 2014-12-20: 09:00:00 via INTRAVENOUS

## 2014-12-20 MED ORDER — SODIUM CHLORIDE 0.9 % WEIGHT BASED INFUSION: 1.0000 mL/kg/h | INTRAVENOUS | Status: AC

## 2014-12-20 MED ORDER — VERAPAMIL HCL 2.5 MG/ML IV SOLN
INTRAVENOUS | Status: AC
  Filled 2014-12-20: qty 2

## 2014-12-20 MED ORDER — SODIUM CHLORIDE 0.9 % IJ SOLN: 3.0000 mL | Freq: Two times a day (BID) | INTRAMUSCULAR | Status: DC

## 2014-12-20 MED ORDER — HEPARIN (PORCINE) IN NACL 2-0.9 UNIT/ML-% IJ SOLN
INTRAMUSCULAR | Status: AC
  Filled 2014-12-20: qty 1000

## 2014-12-20 MED ORDER — ASPIRIN 81 MG PO CHEW: 81.0000 mg | CHEWABLE_TABLET | ORAL | Status: DC

## 2014-12-20 MED ORDER — LIDOCAINE HCL (PF) 1 % IJ SOLN
INTRAMUSCULAR | Status: AC
  Filled 2014-12-20: qty 30

## 2014-12-20 MED ORDER — MIDAZOLAM HCL 2 MG/2ML IJ SOLN
INTRAMUSCULAR | Status: DC | PRN
  Administered 2014-12-20: 2 mg via INTRAVENOUS

## 2014-12-20 MED ORDER — FENTANYL CITRATE (PF) 100 MCG/2ML IJ SOLN
INTRAMUSCULAR | Status: DC | PRN
  Administered 2014-12-20: 25 ug via INTRAVENOUS

## 2014-12-20 MED ORDER — VERAPAMIL HCL 2.5 MG/ML IV SOLN
INTRAVENOUS | Status: DC | PRN
  Administered 2014-12-20: 10:00:00 via INTRA_ARTERIAL

## 2014-12-20 MED ORDER — LIDOCAINE HCL (PF) 1 % IJ SOLN
INTRAMUSCULAR | Status: DC | PRN
  Administered 2014-12-20: 10:00:00

## 2014-12-20 MED ORDER — FENTANYL CITRATE (PF) 100 MCG/2ML IJ SOLN
INTRAMUSCULAR | Status: AC
  Filled 2014-12-20: qty 4

## 2014-12-20 MED ORDER — SODIUM CHLORIDE 0.9 % IJ SOLN: 3.0000 mL | INTRAMUSCULAR | Status: DC | PRN

## 2014-12-20 MED ORDER — IOHEXOL 350 MG/ML SOLN
INTRAVENOUS | Status: DC | PRN
  Administered 2014-12-20: 150 mL via INTRA_ARTERIAL

## 2014-12-20 SURGICAL SUPPLY — 17 items
CATH BALLN WEDGE 5F 110CM (CATHETERS) ×2 IMPLANT
CATH INFINITI 5FR ANG PIGTAIL (CATHETERS) ×3 IMPLANT
CATH INFINITI 5FR JL4 (CATHETERS) ×2 IMPLANT
CATH INFINITI JR4 5F (CATHETERS) ×3 IMPLANT
DEVICE RAD COMP TR BAND LRG (VASCULAR PRODUCTS) ×3 IMPLANT
GLIDESHEATH SLEND SS 6F .021 (SHEATH) ×3 IMPLANT
GUIDEWIRE .025 260CM (WIRE) ×2 IMPLANT
KIT HEART LEFT (KITS) ×3 IMPLANT
KIT HEART RIGHT NAMIC (KITS) ×3 IMPLANT
PACK CARDIAC CATHETERIZATION (CUSTOM PROCEDURE TRAY) ×3 IMPLANT
SHEATH FAST CATH BRACH 5F 5CM (SHEATH) ×2 IMPLANT
SHEATH PINNACLE 5F 10CM (SHEATH) ×2 IMPLANT
SYR MEDRAD MARK V 150ML (SYRINGE) ×3 IMPLANT
TRANSDUCER W/STOPCOCK (MISCELLANEOUS) ×4 IMPLANT
TUBING CIL FLEX 10 FLL-RA (TUBING) ×3 IMPLANT
WIRE HI TORQ VERSACORE-J 145CM (WIRE) ×2 IMPLANT
WIRE SAFE-T 1.5MM-J .035X260CM (WIRE) ×3 IMPLANT

## 2014-12-20 NOTE — H&P (View-Only) (Signed)
[place]. [address], [address] [place], [place]  [address] Phone: [phone] Fax:  [phone]  Date:  12/13/2014   ID:  Carlos Gibson, DOB Aug 13, 1940, MRN [id number]  PCP:  [name], MD   History of Present Illness: Carlos Gibson is a 74 y.o. male with diabetes, hyperlipidemia, history of frequent PVCs with mild to moderate aortic insufficiency here for follow-up and discovery of her ejection fraction 40% on echocardiogram 10/23/14. Previous Echocardiogram performed on 01/03/13 demonstrated ejection fraction of 50-55% with mildly calcified aortic valve and mild to moderate aortic valve regurgitation with aortic root dilatation of 4 cm. Mild diastolic dysfunction was noted.  Cough started again after restarting lisinopril.  Wheezing if lay on side. No problem laying flat.  No CP.   He has been diagnosed in the past with Gilbert's syndrome and mild thrombocytopenia. He also has a bifascicular block, right bundle branch block and left posterior fascicular block.  Ejection fraction by MUGA on 07/19/08 was 55-60%.  Worked prior at [employer]. Feels PVC, rare. No syncope.   ECHO 10/23/14:  - Left ventricle: The cavity size was severely dilated. Wall thickness was increased in a pattern of mild LVH. Systolic function was moderately reduced. The estimated ejection fraction was 40%. - Aortic valve: There was mild regurgitation. - Mitral valve: There was mild to moderate regurgitation. - Left atrium: The atrium was severely dilated. - Right atrium: The atrium was mildly dilated. - Pulmonary arteries: PA peak pressure: 60 mm Hg (S). - Pericardium, extracardiac: A trivial pericardial effusion was identified.   Wt Readings from Last 3 Encounters:  12/13/14 180 lb 6.4 oz (81.829 kg)  03/05/14 190 lb (86.183 kg)     No past medical history on file.  No past surgical history on file.  Current Outpatient Prescriptions  Medication Sig Dispense Refill  . alfuzosin  (UROXATRAL) 10 MG 24 hr tablet Take 10 mg by mouth daily.  3  . aspirin 81 MG tablet Take 81 mg by mouth daily.    [name] atorvastatin (LIPITOR) 20 MG tablet Take 20 mg by mouth daily.  8  . calcium-vitamin D (OSCAL WITH D) 500-200 MG-UNIT per tablet Take 1 tablet by mouth.    . finasteride (PROSCAR) 5 MG tablet   2  . lisinopril (PRINIVIL,ZESTRIL) 5 MG tablet     . metFORMIN (GLUCOPHAGE) 500 MG tablet Take 1,000 mg by mouth 2 (two) times daily.  3   No current facility-administered medications for this visit.    Allergies:   No Known Allergies  Social History:  The patient  reports that he has never smoked. He does not have any smokeless tobacco history on file.   Family History  Problem Relation Age of Onset  . Hypertension Mother   . Irregular heart beat Father     ROS:  Please see the history of present illness.   Denies any fevers, chills, orthopnea, PND. Positive for occasional palpitations. No shortness of breath.   All other systems reviewed and negative.   PHYSICAL EXAM: VS:  BP 128/90 mmHg  Pulse 98  Ht 5\' 11"  (1.803 m)  Wt 180 lb 6.4 oz (81.829 kg)  BMI 25.17 kg/m2  SpO2 96% Well nourished, well developed, in no acute distress HEENT: normal, [place]/AT, EOMI Neck: no JVD, normal carotid upstroke, no bruit Cardiac:  normal S1, S2; RRR; 1/6 diastolic murmurRight upper sternal border Lungs:  clear to auscultation bilaterally, no wheezing, rhonchi or rales Abd: soft, nontender,  no hepatomegaly, no bruits Ext: no edema, 2+ distal pulses, 2+ radial pulse Skin: warm and dry GU: deferred Neuro: no focal abnormalities noted, AAO x 3  EKG:  08/23/14-sinus rhythm with PACs, PVCs, left anterior fascicular block, right bundle branch block, nonspecific ST-T wave changes-personally viewed-03/05/14-sinus rhythm, 63, right bundle branch block, left posterior fascicular block (negative axis lead 1, positive aVR, positive inferior leads, nonspecific ST-T wave changes.  ASSESSMENT AND  PLAN:  1. Cardiomyopathy, dilated, acute systolic heart failure - EF now 40% in 7/16. 8 pound fluid gain, nocturnal dyspnea, cough, BNP was elevated at 1410. After Lasix administration, he had improvement of his symptoms according to Dr. Carlyle Lipa note. No symptoms of chest pain. His lisinopril was added back and cough returned. I will go ahead and stop the lisinopril and start losartan 25 g once a day. I will also add carvedilol 3.125 mg twice a day as a beta blocker given his cardiomyopathy. In the past, metoprolol had caused fatigue. In addition, given his reduction in ejection fraction, I will check cardiac catheterization, radial/brachial right and left heart catheterization to exclude coronary artery disease. He is not having any chest discomfort. For now, continue with Lasix 20 mg once a day. Risk of stroke, heart attack, death, renal impairment, bleeding were discussed. Patient wife present for discussion. Pulmonary pressures were estimated at 60 mmHg. We will check right heart catheterization.  2. Aortic regurgitation-previously in the moderate range. Diastolic murmur heard on exam. No symptoms of significant shortness of breath. Occasional atypical sharp chest discomfort. I will check an echocardiogram to monitor his aortic valve regurgitation. 3. Dilated aortic root-previously 4 cm.  Continuing with blood pressure control. 4. Hypertension-. Monitor. ARB because of ACE inhibitor cough, carvedilol. 5. PVC-Chronic. Ectopy. Asymptomatic except for occasional palpitations felt at night when quiet. Overall, no high risk symptoms. Starting carvedilol. Could this be also contribute into his cardiomyopathy? 6. Hyperlipidemia-tolerating statin medication well.  Dr. Felipa Eth following labs. 7. Right bundle branch block/left posterior fascicular block-watch for any signs of syncope. Be careful with AV nodal blocking agents.  Cath Lab Visit (complete for each Cath Lab visit)  Clinical Evaluation Leading  to the Procedure:   ACS: No.  Non-ACS:    Anginal Classification: CCS II  Anti-ischemic medical therapy: Minimal Therapy (1 class of medications)  Non-Invasive Test Results: No non-invasive testing performed  Prior CABG: No previous CABG    Signed, [name], MD [facility]  12/13/2014 9:03 AM

## 2014-12-20 NOTE — Progress Notes (Signed)
TR BAND REMOVAL  LOCATION:    right radial  DEFLATED PER PROTOCOL:    Yes.    TIME BAND OFF / DRESSING APPLIED:    1345   SITE UPON ARRIVAL:    Level 0  SITE AFTER BAND REMOVAL:    Level 0  CIRCULATION SENSATION AND MOVEMENT:    Within Normal Limits   Yes.    COMMENTS:   TRB REOMOVED/ TEGADERM DSG APPLIED

## 2014-12-20 NOTE — Progress Notes (Signed)
Site area: rt groin fa sheath Site Prior to Removal:  Level 0 Pressure Applied For:  20 minutes Manual:   yes Patient Status During Pull:  stable Post Pull Site:  Level  0 Post Pull Instructions Given:  yes Post Pull Pulses Present: yes Dressing Applied:  tegaderm Bedrest begins @  1150 Comments:  0

## 2014-12-20 NOTE — Progress Notes (Addendum)
Site area: right groin a 5 french arterial was removed  Site Prior to Removal:  Level 0  Pressure Applied For 25 MINUTES    Minutes Beginning at 1130a  Manual:   Yes.    Patient Status During Pull:  stable  Post Pull Groin Site:  Level 0  Post Pull Instructions Given:  Yes.    Post Pull Pulses Present:  Yes.    Dressing Applied:  Yes.    Comments:  VS remain stable during sheath pull.  Pt resting with eyes closed

## 2014-12-20 NOTE — Interval H&P Note (Signed)
History and Physical Interval Note:  12/20/2014 9:24 AM  Carlos Gibson  has presented today for surgery, with the diagnosis of cm  The various methods of treatment have been discussed with the patient and family. After consideration of risks, benefits and other options for treatment, the patient has consented to  Procedure(s): Right/Left Heart Cath and Coronary Angiography (N/A) as a surgical intervention .  The patient's history has been reviewed, patient examined, no change in status, stable for surgery.  I have reviewed the patient's chart and labs.  Questions were answered to the patient's satisfaction.     SKAINS, MARK

## 2014-12-20 NOTE — Discharge Instructions (Signed)
HOLD METFORMIN FOR 48 HOURS. RESUME ON SUN. MORNING    Arteriogram Care After These instructions give you information on caring for yourself after your procedure. Your doctor may also give you more specific instructions. Call your doctor if you have any problems or questions after your procedure. HOME CARE  Do not bathe, swim, or use a hot tub until directed by your doctor. You can shower.  Do not lift anything heavier than 10 pounds (about a gallon of milk) for 2 days.  Do not walk a lot, run, or drive for 2 days.  Return to normal activities in 2 days or as told by your doctor. Finding out the results of your test Ask when your test results will be ready. Make sure you get your test results. GET HELP RIGHT AWAY IF:   You have fever.  You have more pain in your leg.  The leg that was cut is:  Bleeding.  Puffy (swollen) or red.  Cold.  Pale or changes color.  Weak.  Tingly or numb. If you go to the Emergency Room, tell your nurse that you have had an arteriogram. Take this paper with you to show the nurse. MAKE SURE YOU:  Understand these instructions.  Will watch your condition.  Will get help right away if you are not doing well or get worse. Document Released: 06/19/2008 Document Revised: 03/28/2013 Document Reviewed: 06/19/2008 [facility] Patient Information 2015 [place], [place]. This information is not intended to replace advice given to you by your health care provider. Make sure you discuss any questions you have with your health care provider.     Radial Site Care Refer to this sheet in the next few weeks. These instructions provide you with information on caring for yourself after your procedure. Your caregiver may also give you more specific instructions. Your treatment has been planned according to current medical practices, but problems sometimes occur. Call your caregiver if you have any problems or questions after your procedure. HOME CARE  INSTRUCTIONS  You may shower the day after the procedure.Remove the bandage (dressing) and gently wash the site with plain soap and water.Gently pat the site dry.  Do not apply powder or lotion to the site.  Do not submerge the affected site in water for 3 to 5 days.  Inspect the site at least twice daily.  Do not flex or bend the affected arm for 24 hours.  No lifting over 5 pounds (2.3 kg) for 5 days after your procedure.  Do not drive home if you are discharged the same day of the procedure. Have someone else drive you.  You may drive 24 hours after the procedure unless otherwise instructed by your caregiver.  Do not operate machinery or power tools for 24 hours.  A responsible adult should be with you for the first 24 hours after you arrive home. What to expect:  Any bruising will usually fade within 1 to 2 weeks.  Blood that collects in the tissue (hematoma) may be painful to the touch. It should usually decrease in size and tenderness within 1 to 2 weeks. SEEK IMMEDIATE MEDICAL CARE IF:  You have unusual pain at the radial site.  You have redness, warmth, swelling, or pain at the radial site.  You have drainage (other than a small amount of blood on the dressing).  You have chills.  You have a fever or persistent symptoms for more than 72 hours.  You have a fever and your symptoms suddenly get worse.  Your arm becomes pale, cool, tingly, or numb.  You have heavy bleeding from the site. Hold pressure on the site. Document Released: 04/25/2010 Document Revised: 06/15/2011 Document Reviewed: 04/25/2010 [facility] Patient Information 2015 [place], [place]. This information is not intended to replace advice given to you by your health care provider. Make sure you discuss any questions you have with your health care provider.

## 2014-12-20 NOTE — Progress Notes (Signed)
Site area: right brachial  Sheath was removed   Site Prior to Removal:  Level 0  Pressure Applied For 15 MINUTES    Minutes Beginning at 1115a  Manual:   Yes.    Patient Status During Pull:  stable  Post Pull Groin Site:  Level 0  Post Pull Instructions Given:  Yes.    Post Pull Pulses Present:  Yes.    Dressing Applied:  Yes.    Comments:  VS remain stable during sheath pull. No complaints of discomfort at this time.

## 2014-12-20 NOTE — Progress Notes (Signed)
Site area: rt ac venous sheath Site Prior to Removal:  Level 0 Pressure Applied For:  10 minutes Manual:   yes Patient Status During Pull:  stable Post Pull Site:  Level  0 Post Pull Instructions Given:  yes Post Pull Pulses Present: yes Dressing Applied:  Small tegaderm Bedrest begins @  Comments:

## 2014-12-21 ENCOUNTER — Telehealth: Payer: Self-pay | Admitting: *Deleted

## 2014-12-21 ENCOUNTER — Other Ambulatory Visit: Payer: Self-pay | Admitting: Cardiology

## 2014-12-21 ENCOUNTER — Encounter (HOSPITAL_COMMUNITY): Payer: Self-pay | Admitting: Cardiology

## 2014-12-21 MED ORDER — POTASSIUM CHLORIDE CRYS ER 20 MEQ PO TBCR: 20.0000 meq | EXTENDED_RELEASE_TABLET | Freq: Every day | ORAL | Status: DC

## 2014-12-21 MED ORDER — FUROSEMIDE 40 MG PO TABS: 40.0000 mg | ORAL_TABLET | Freq: Two times a day (BID) | ORAL | Status: DC

## 2014-12-21 NOTE — Telephone Encounter (Signed)
Pt aware to increase his Furosemide to 40 mg twice a day for 3 days and to start potassium 20 MEQ daily as ordered by Dr Marlou Porch.  He was given an appt for Monday at 10:30 to follow up.

## 2014-12-21 NOTE — Telephone Encounter (Signed)
Just wanted to clarify that the patient was to take 40mg  bid for three days only. How should he be taking now? Please advise. Thanks, MI

## 2014-12-21 NOTE — Telephone Encounter (Signed)
Call received directly into triage from pt.  Pt reports he had cath yesterday and is continuing to have shortness of breath. States he did not sleep at all last night.  States medicine was going to be called in. Does not which medication.  States he has had this shortness of breath prior to seeing Dr. Marlou Porch in office but feels it may be a little worse today.  He is calling for treatment plan.  I told pt I would forward to Dr. Marlou Porch for his recommendations and we would call him back.  Call back number for pt is [phone]

## 2014-12-24 ENCOUNTER — Ambulatory Visit (INDEPENDENT_AMBULATORY_CARE_PROVIDER_SITE_OTHER): Payer: Medicare Other | Admitting: Cardiology

## 2014-12-24 VITALS — BP 132/72 | HR 96 | Ht 71.0 in | Wt 180.1 lb

## 2014-12-24 DIAGNOSIS — I428 Other cardiomyopathies: Secondary | ICD-10-CM

## 2014-12-24 DIAGNOSIS — I493 Ventricular premature depolarization: Secondary | ICD-10-CM | POA: Insufficient documentation

## 2014-12-24 DIAGNOSIS — I351 Nonrheumatic aortic (valve) insufficiency: Secondary | ICD-10-CM

## 2014-12-24 DIAGNOSIS — IMO0002 Reserved for concepts with insufficient information to code with codable children: Secondary | ICD-10-CM

## 2014-12-24 DIAGNOSIS — I77810 Thoracic aortic ectasia: Secondary | ICD-10-CM | POA: Insufficient documentation

## 2014-12-24 DIAGNOSIS — I4510 Unspecified right bundle-branch block: Secondary | ICD-10-CM

## 2014-12-24 DIAGNOSIS — I272 Other secondary pulmonary hypertension: Secondary | ICD-10-CM

## 2014-12-24 DIAGNOSIS — I429 Cardiomyopathy, unspecified: Secondary | ICD-10-CM | POA: Diagnosis not present

## 2014-12-24 DIAGNOSIS — I5021 Acute systolic (congestive) heart failure: Secondary | ICD-10-CM

## 2014-12-24 DIAGNOSIS — D696 Thrombocytopenia, unspecified: Secondary | ICD-10-CM | POA: Insufficient documentation

## 2014-12-24 DIAGNOSIS — I359 Nonrheumatic aortic valve disorder, unspecified: Secondary | ICD-10-CM | POA: Insufficient documentation

## 2014-12-24 DIAGNOSIS — I519 Heart disease, unspecified: Secondary | ICD-10-CM | POA: Insufficient documentation

## 2014-12-24 LAB — BASIC METABOLIC PANEL
BUN: 22 mg/dL (ref 6–23)
CALCIUM: 9.1 mg/dL (ref 8.4–10.5)
CO2: 27 mEq/L (ref 19–32)
Chloride: 104 mEq/L (ref 96–112)
Creatinine, Ser: 1.29 mg/dL (ref 0.40–1.50)
GFR: 69.97 mL/min (ref >60.00)
Glucose, Bld: 205 mg/dL — ABNORMAL HIGH (ref 70–99)
Potassium: 3.5 mEq/L (ref 3.5–5.1)
SODIUM: 141 meq/L (ref 135–145)

## 2014-12-24 LAB — TSH: TSH: 2.64 u[IU]/mL (ref 0.35–4.50)

## 2014-12-24 MED ORDER — FUROSEMIDE 40 MG PO TABS: 40.0000 mg | ORAL_TABLET | Freq: Two times a day (BID) | ORAL | Status: DC

## 2014-12-24 NOTE — Progress Notes (Signed)
Cardiology Office Note   Date:  12/24/2014   ID:  Carlos Gibson, DOB 12-24-1940, MRN [id number]  PCP:  [name], MD  Cardiologist:   [name], MD       History of Present Illness: Carlos Gibson is a 74 y.o. male who presents for systolic heart failure. Had cardiac catheterization which showed normal coronary arteries, nonischemic cardiomyopathy, ejection fraction in the 25% range. His left ventricular end-diastolic pressure was also elevated in the mid 20s. Pulmonary pressures were moderately elevated.  He has been having some shortness of breath.  He still having some cough. Some orthopnea. This is likely result of his nonischemic cardiomyopathy.  Continuing on Lasix as below.  On original presentation, his ejection fraction was found to be 40% in July 2016. 8 pounds fluid gain was noted. BNP was 1410. He had improvements after Lasix ministration according to Dr. Felipa Eth previously. Lisinopril was tried but ACE inhibitor cough was produced.  Back in 2014 his ejection fraction was low normal at 50-55%, aortic root was 4 cm. Has a history of PVCs. Previously worked at [employer].    Past Medical History  Diagnosis Date  . Bifascicular block   . Right BBB/left post fasc block   . Diabetes   . Hyperlipidemia   . Frequent PVCs   . Ejection fraction < 50%   . Aortic valve calcification   . Aortic valve regurgitation   . Aortic root dilation   . Mild diastolic dysfunction   . Gilbert's syndrome   . Thrombocytopenia   . Cardiomyopathy     Past Surgical History  Procedure Laterality Date  . Cardiac catheterization N/A 12/20/2014    Procedure: Right/Left Heart Cath and Coronary Angiography;  Surgeon: Jerline Pain, MD;  Location: [facility];  Service: Cardiovascular;  Laterality: N/A;     Current Outpatient Prescriptions  Medication Sig Dispense Refill  . alfuzosin (UROXATRAL) 10 MG 24 hr tablet Take 10 mg by mouth daily.  3  . aspirin EC 81 MG  tablet Take 81 mg by mouth daily.    [name] atorvastatin (LIPITOR) 20 MG tablet Take 20 mg by mouth daily.  8  . calcium-vitamin D (OSCAL WITH D) 500-200 MG-UNIT per tablet Take 1 tablet by mouth.    . carvedilol (COREG) 3.125 MG tablet Take 1 tablet (3.125 mg total) by mouth 2 (two) times daily. 60 tablet 6  . finasteride (PROSCAR) 5 MG tablet Take 5 mg by mouth daily.   2  . furosemide (LASIX) 40 MG tablet Take 1 tablet (40 mg total) by mouth 2 (two) times daily. 60 tablet 3  . losartan (COZAAR) 25 MG tablet Take 1 tablet (25 mg total) by mouth daily. 30 tablet 6  . metFORMIN (GLUCOPHAGE) 500 MG tablet Take 1,000 mg by mouth 2 (two) times daily.  3  . potassium chloride SA (K-DUR,KLOR-CON) 20 MEQ tablet Take 1 tablet (20 mEq total) by mouth daily. 30 tablet 6   No current facility-administered medications for this visit.    Allergies:   Ace inhibitors    Social History:  The patient  reports that he has never smoked. He does not have any smokeless tobacco history on file.   Family History:  The patient's family history includes Hypertension in his mother; Irregular heart beat in his father.    ROS:  Please see the history of present illness.   Otherwise, review of systems are positive for none.   All other systems  are reviewed and negative.    PHYSICAL EXAM: VS:  BP 132/72 mmHg  Pulse 96  Ht 5\' 11"  (1.803 m)  Wt 180 lb 1.9 oz (81.702 kg)  BMI 25.13 kg/m2  SpO2 94% , BMI Body mass index is 25.13 kg/(m^2). GEN: Well nourished, well developed, in no acute distress HEENT: normal Neck: no JVD, carotid bruits, or masses Cardiac: RRR; no murmurs, rubs, + S3 gallops, no edema  Respiratory:  clear to auscultation bilaterally, normal work of breathing GI: soft, nontender, nondistended, + BS MS: no deformity or atrophy Skin: warm and dry, no rash Neuro:  Strength and sensation are intact Psych: euthymic mood, full affect   EKG:  Not done today  CATH: 12/20/14  There is severe left  ventricular systolic dysfunction.  Ejection fraction between 25-30% with global hypokinesis  Moderately elevated pulmonary pressures-mean PA pressure 35 mmHg consistent with secondary pulmonary hypertension as a result of increased left ventricular end-diastolic pressure  No angiographically significant coronary artery disease present.  Nonischemic cardiomyopathy. Left ventricular ejection fraction 25-30%. Left ventricular end-diastolic pressure 24 mmHg. Cardiac output 3.7 L/m with cardiac index of 1.8.  We'll continue with aggressive medical management. Up titration of medications.   Recent Labs: 12/13/2014: BUN 15; Creatinine, Ser 1.26; Hemoglobin 13.3; Platelets 129.0*; Potassium 3.9; Sodium 141    Lipid Panel No results found for: CHOL, TRIG, HDL, CHOLHDL, VLDL, LDLCALC, LDLDIRECT    Wt Readings from Last 3 Encounters:  12/24/14 180 lb 1.9 oz (81.702 kg)  12/20/14 177 lb (80.287 kg)  12/13/14 180 lb 6.4 oz (81.829 kg)      Other studies Reviewed: Additional studies/ records that were reviewed today include: Lab work, notes, cath report Review of the above records demonstrates: As above   ASSESSMENT AND PLAN:  1.  Acute on chronic systolic heart failure/dilated cardiomyopathy-nonischemic cardiomyopathy  - LVEDP was 23  - Feels a little bit better after a weekend of Lasix 40 mg twice a day  - Last creatinine 1.26  - We will check basic metabolic profile today.  - He does not show any overt signs of fluid overload. Moderate JVD noted however. Positive S3.  - I will continue Lasix 40 mg twice a day. I will see him back next week.  - Continue with potassium supplementation.  - Fluid restriction, salt restriction.  - Main symptom is orthopnea. Mild cough.  - Next week if stable, we will increase his beta blocker/angiotensin receptor blocker. heart rate is slightly increased  - Checking a TSH as well as basic metabolic profile today.  2. Secondary pulmonary hypertension   - Moderate, a result of left ventricular systolic dysfunction.  - Treat left-sided heart disease.  3. Mild to Moderate aortic regurgitation  - We will monitor clinically.     Current medicines are reviewed at length with the patient today.  The patient does not have concerns regarding medicines.  The following changes have been made:  no change  Labs/ tests ordered today include:   Orders Placed This Encounter  Procedures  . Basic Metabolic Panel (BMET)  . TSH     Disposition:   FU with Skains in 1 week  Signed, [name], MD  12/24/2014 11:11 AM    [facility] [address], [place], [place]  [address] Phone: [phone]; Fax: [phone]

## 2014-12-24 NOTE — Patient Instructions (Addendum)
Medication Instructions:  Continue all medications as listed.  Labwork: Please have BMP and TSH today.  Follow-Up: Follow up in 1 weeks with Dr Marlou Porch.  Thank you for choosing [facility]!!

## 2015-01-01 ENCOUNTER — Encounter: Payer: Self-pay | Admitting: Cardiology

## 2015-01-01 ENCOUNTER — Ambulatory Visit (INDEPENDENT_AMBULATORY_CARE_PROVIDER_SITE_OTHER): Payer: Medicare Other | Admitting: Cardiology

## 2015-01-01 VITALS — BP 108/60 | HR 60 | Ht 71.0 in | Wt 174.1 lb

## 2015-01-01 DIAGNOSIS — I77810 Thoracic aortic ectasia: Secondary | ICD-10-CM

## 2015-01-01 DIAGNOSIS — I10 Essential (primary) hypertension: Secondary | ICD-10-CM

## 2015-01-01 DIAGNOSIS — I5021 Acute systolic (congestive) heart failure: Secondary | ICD-10-CM

## 2015-01-01 DIAGNOSIS — I429 Cardiomyopathy, unspecified: Secondary | ICD-10-CM | POA: Diagnosis not present

## 2015-01-01 MED ORDER — FUROSEMIDE 40 MG PO TABS: 40.0000 mg | ORAL_TABLET | Freq: Every day | ORAL | Status: DC

## 2015-01-01 NOTE — Patient Instructions (Signed)
Medication Instructions:  Please decrease your Furosemide to 40 mg once a day. Continue all other medications as listed.  Please continue with daily weighs.  Follow-Up: Follow up in 1 month with Dr Marlou Porch.  Thank you for choosing [facility]!!

## 2015-01-01 NOTE — Progress Notes (Signed)
Cardiology Office Note   Date:  01/01/2015   ID:  VIDEL NAGER, DOB [birth year], MRN [id number]  PCP:  [name], MD  Cardiologist:   [name], MD       History of Present Illness: Carlos Gibson is a 74 y.o. male who presents for nonischemic cardiomyopathy/ systolic heart failure. Had cardiac catheterization which showed normal coronary arteries, nonischemic cardiomyopathy, ejection fraction in the 25% range. His left ventricular end-diastolic pressure was also elevated in the mid 20s. Pulmonary pressures were moderately elevated.  Cough has improved, symptoms have improved after diuresing with 40 mg twice a day of Lasix.   We are going to decrease his Lasix to once a day 40 mg. He has felt some mild dizziness recently after more aggressive diuresis.  On original presentation, his ejection fraction was found to be 40% in July 2016. 8 pounds fluid gain was noted. BNP was 1410. He had improvements after Lasix ministration according to Dr. Felipa Eth previously. Lisinopril was tried but ACE inhibitor cough was produced.  Back in 2014 his ejection fraction was low normal at 50-55%, aortic root was 4 cm. Has a history of PVCs. Previously worked at [employer].    Past Medical History  Diagnosis Date  . Bifascicular block   . Right BBB/left post fasc block   . Diabetes   . Hyperlipidemia   . Frequent PVCs   . Ejection fraction < 50%   . Aortic valve calcification   . Aortic valve regurgitation   . Aortic root dilation   . Mild diastolic dysfunction   . Gilbert's syndrome   . Thrombocytopenia   . Cardiomyopathy     Past Surgical History  Procedure Laterality Date  . Cardiac catheterization N/A 12/20/2014    Procedure: Right/Left Heart Cath and Coronary Angiography;  Surgeon: Jerline Pain, MD;  Location: [facility];  Service: Cardiovascular;  Laterality: N/A;     Current Outpatient Prescriptions  Medication Sig Dispense Refill  . alfuzosin (UROXATRAL)  10 MG 24 hr tablet Take 10 mg by mouth daily.  3  . aspirin EC 81 MG tablet Take 81 mg by mouth daily.    [name] atorvastatin (LIPITOR) 20 MG tablet Take 20 mg by mouth daily.  8  . calcium-vitamin D (OSCAL WITH D) 500-200 MG-UNIT per tablet Take 1 tablet by mouth.    . carvedilol (COREG) 3.125 MG tablet Take 1 tablet (3.125 mg total) by mouth 2 (two) times daily. 60 tablet 6  . finasteride (PROSCAR) 5 MG tablet Take 5 mg by mouth daily.   2  . furosemide (LASIX) 40 MG tablet Take 1 tablet (40 mg total) by mouth 2 (two) times daily. 60 tablet 3  . losartan (COZAAR) 25 MG tablet Take 1 tablet (25 mg total) by mouth daily. 30 tablet 6  . metFORMIN (GLUCOPHAGE) 500 MG tablet Take 1,000 mg by mouth 2 (two) times daily.  3  . potassium chloride SA (K-DUR,KLOR-CON) 20 MEQ tablet Take 1 tablet (20 mEq total) by mouth daily. 30 tablet 6   No current facility-administered medications for this visit.    Allergies:   Ace inhibitors cough   Social History:  The patient  reports that he has never smoked. He does not have any smokeless tobacco history on file.   Family History:  The patient's family history includes Hypertension in his mother; Irregular heart beat in his father; Stroke in his mother; Unexplained death in his father.    ROS:  Please see the history of present illness.   Otherwise, review of systems are positive for none.   All other systems are reviewed and negative.    PHYSICAL EXAM: VS:  BP 108/60 mmHg  Pulse 60  Ht 5\' 11"  (1.803 m)  Wt 174 lb 1.9 oz (78.98 kg)  BMI 24.30 kg/m2 , BMI Body mass index is 24.3 kg/(m^2). GEN: Well nourished, well developed, in no acute distress HEENT: normal Neck: no JVD, carotid bruits, or masses Cardiac: RRR; no murmurs, rubs, + S3 gallops, no edema  Respiratory:  clear to auscultation bilaterally, normal work of breathing GI: soft, nontender, nondistended, + BS MS: no deformity or atrophy Skin: warm and dry, no rash Neuro:  Strength and sensation  are intact Psych: euthymic mood, full affect   EKG:  Not done today  CATH: 12/20/14  There is severe left ventricular systolic dysfunction.  Ejection fraction between 25-30% with global hypokinesis  Moderately elevated pulmonary pressures-mean PA pressure 35 mmHg consistent with secondary pulmonary hypertension as a result of increased left ventricular end-diastolic pressure  No angiographically significant coronary artery disease present.  Nonischemic cardiomyopathy. Left ventricular ejection fraction 25-30%. Left ventricular end-diastolic pressure 24 mmHg. Cardiac output 3.7 L/m with cardiac index of 1.8.  We'll continue with aggressive medical management. Up titration of medications.   Recent Labs: 12/13/2014: Hemoglobin 13.3; Platelets 129.0* 12/24/2014: BUN 22; Creatinine, Ser 1.29; Potassium 3.5; Sodium 141; TSH 2.64    Lipid Panel No results found for: CHOL, TRIG, HDL, CHOLHDL, VLDL, LDLCALC, LDLDIRECT    Wt Readings from Last 3 Encounters:  01/01/15 174 lb 1.9 oz (78.98 kg)  12/24/14 180 lb 1.9 oz (81.702 kg)  12/20/14 177 lb (80.287 kg)      Other studies Reviewed: Additional studies/ records that were reviewed today include: Lab work, notes, cath report Review of the above records demonstrates: As above   ASSESSMENT AND PLAN:  1.  Acute on chronic systolic heart failure/dilated cardiomyopathy-nonischemic cardiomyopathy  - LVEDP was 23  - Feeling much better, resolved symptoms. Dry weight currently 174 pounds.  - Last creatinine 1.26  - He does not show any overt signs of fluid overload.  - Continue with potassium supplementation.  - Fluid restriction, salt restriction.  - Main symptom is orthopnea. Mild cough. This has improved.  - Blood pressure still remains quite low, unable to increase his beta blocker/angiotensin receptor blocker. Heart rate is improved however.  - If dizziness continues, may need to decrease Lasix to 20 mg.  2. Secondary pulmonary  hypertension  - Moderate, a result of left ventricular systolic dysfunction.  - Treat left-sided heart disease.  3. Mild to Moderate aortic regurgitation  - We will monitor clinically.     Current medicines are reviewed at length with the patient today.  The patient does not have concerns regarding medicines.  The following changes have been made:  no change  Labs/ tests ordered today include:   No orders of the defined types were placed in this encounter.     Disposition:   FU with Skains in 1 month, consider basic profile at that time.  Bobby Rumpf, MD  01/01/2015 9:46 AM    [facility] [place], [place], [place]  [address] Phone: [phone]; Fax: [phone]

## 2015-01-22 DIAGNOSIS — E119 Type 2 diabetes mellitus without complications: Secondary | ICD-10-CM | POA: Diagnosis not present

## 2015-01-22 DIAGNOSIS — I429 Cardiomyopathy, unspecified: Secondary | ICD-10-CM | POA: Diagnosis not present

## 2015-01-22 DIAGNOSIS — I10 Essential (primary) hypertension: Secondary | ICD-10-CM | POA: Diagnosis not present

## 2015-01-31 ENCOUNTER — Ambulatory Visit (INDEPENDENT_AMBULATORY_CARE_PROVIDER_SITE_OTHER): Payer: Medicare Other | Admitting: Cardiology

## 2015-01-31 ENCOUNTER — Encounter: Payer: Self-pay | Admitting: Cardiology

## 2015-01-31 VITALS — BP 118/80 | HR 65 | Ht 71.0 in | Wt 179.8 lb

## 2015-01-31 DIAGNOSIS — I77810 Thoracic aortic ectasia: Secondary | ICD-10-CM

## 2015-01-31 DIAGNOSIS — I272 Other secondary pulmonary hypertension: Secondary | ICD-10-CM

## 2015-01-31 DIAGNOSIS — I429 Cardiomyopathy, unspecified: Secondary | ICD-10-CM | POA: Diagnosis not present

## 2015-01-31 DIAGNOSIS — IMO0002 Reserved for concepts with insufficient information to code with codable children: Secondary | ICD-10-CM

## 2015-01-31 DIAGNOSIS — I10 Essential (primary) hypertension: Secondary | ICD-10-CM | POA: Diagnosis not present

## 2015-01-31 NOTE — Patient Instructions (Signed)
Medication Instructions:  The current medical regimen is effective;  continue present plan and medications.  Follow-Up: Follow up in 3 months with Dr Skains.  If you need a refill on your cardiac medications before your next appointment, please call your pharmacy.  Thank you for choosing [facility]!!     

## 2015-01-31 NOTE — Progress Notes (Signed)
Cardiology Office Note   Date:  01/31/2015   ID:  Carlos Gibson, DOB [birth year], MRN [id number]  PCP:  [name], MD  Cardiologist:   [name], MD       History of Present Illness: Carlos Gibson is a 74 y.o. male who presents for nonischemic cardiomyopathy/ systolic heart failure. Had cardiac catheterization which showed normal coronary arteries, nonischemic cardiomyopathy, ejection fraction in the 25% range. His left ventricular end-diastolic pressure was also elevated in the mid 20s. Pulmonary pressures were moderately elevated.  Cough has improved, symptoms have improved after diuresing with 40 mg twice a day of Lasix.   We are going to decrease his Lasix to once a day 40 mg. He has felt some mild dizziness recently after more aggressive diuresis.  On original presentation, his ejection fraction was found to be 40% in July 2016. 8 pounds fluid gain was noted. BNP was 1410. He had improvements after Lasix ministration according to Dr. Felipa Eth previously. Lisinopril was tried but ACE inhibitor cough was produced.  Back in 2014 his ejection fraction was low normal at 50-55%, aortic root was 4 cm. Has a history of PVCs. Previously worked at [employer].  01/31/15-feels great. No longer having any shortness of breath. Doing very well. Dr. Felipa Eth on 01/22/15 started losartan 25 mg. He is feeling much better. Continue with current medication. No change with Lasix. See below for details. No dizziness. His wife is accompanying him on this visit.   Past Medical History  Diagnosis Date  . Bifascicular block   . Right BBB/left post fasc block   . Diabetes ([place])   . Hyperlipidemia   . Frequent PVCs   . Ejection fraction < 50%   . Aortic valve calcification   . Aortic valve regurgitation   . Aortic root dilation (HCC)   . Mild diastolic dysfunction   . Gilbert's syndrome   . Thrombocytopenia ([place])   . Cardiomyopathy [facility])     Past Surgical History  Procedure  Laterality Date  . Cardiac catheterization N/A 12/20/2014    Procedure: Right/Left Heart Cath and Coronary Angiography;  Surgeon: Jerline Pain, MD;  Location: [place] [facility];  Service: Cardiovascular;  Laterality: N/A;     Current Outpatient Prescriptions  Medication Sig Dispense Refill  . alfuzosin (UROXATRAL) 10 MG 24 hr tablet Take 10 mg by mouth daily.  3  . aspirin EC 81 MG tablet Take 81 mg by mouth daily.    [name] atorvastatin (LIPITOR) 20 MG tablet Take 20 mg by mouth daily.  8  . calcium-vitamin D (OSCAL WITH D) 500-200 MG-UNIT per tablet Take 1 tablet by mouth.    . carvedilol (COREG) 3.125 MG tablet Take 1 tablet (3.125 mg total) by mouth 2 (two) times daily. 60 tablet 6  . finasteride (PROSCAR) 5 MG tablet Take 5 mg by mouth daily.   2  . furosemide (LASIX) 20 MG tablet Take 20 mg by mouth 2 (two) times daily.    [name] losartan (COZAAR) 25 MG tablet Take 1 tablet (25 mg total) by mouth daily. 30 tablet 6  . metFORMIN (GLUCOPHAGE) 500 MG tablet Take 1,000 mg by mouth 2 (two) times daily.  3  . potassium chloride SA (K-DUR,KLOR-CON) 20 MEQ tablet Take 1 tablet (20 mEq total) by mouth daily. 30 tablet 6   No current facility-administered medications for this visit.    Allergies:   Ace inhibitors cough   Social History:  The patient  reports that  he has never smoked. He does not have any smokeless tobacco history on file.   Family History:  The patient's family history includes Hypertension in his mother; Irregular heart beat in his father; Stroke in his mother; Unexplained death in his father.    ROS:  Please see the history of present illness.   Otherwise, review of systems are positive for none.   All other systems are reviewed and negative.    PHYSICAL EXAM: VS:  BP 118/80 mmHg  Pulse 65  Ht 5\' 11"  (1.803 m)  Wt 179 lb 12.8 oz (81.557 kg)  BMI 25.09 kg/m2  SpO2 97% , BMI Body mass index is 25.09 kg/(m^2). GEN: Well nourished, well developed, in no acute  distress HEENT: normal Neck: no JVD, carotid bruits, or masses Cardiac: RRR; no murmurs, rubs, + S3 gallops, no edema  Respiratory:  clear to auscultation bilaterally, normal work of breathing GI: soft, nontender, nondistended, + BS MS: no deformity or atrophy Skin: warm and dry, no rash Neuro:  Strength and sensation are intact Psych: euthymic mood, full affect   EKG:  Not done today  CATH: 12/20/14  There is severe left ventricular systolic dysfunction.  Ejection fraction between 25-30% with global hypokinesis  Moderately elevated pulmonary pressures-mean PA pressure 35 mmHg consistent with secondary pulmonary hypertension as a result of increased left ventricular end-diastolic pressure  No angiographically significant coronary artery disease present.  Nonischemic cardiomyopathy. Left ventricular ejection fraction 25-30%. Left ventricular end-diastolic pressure 24 mmHg. Cardiac output 3.7 L/m with cardiac index of 1.8.  We'll continue with aggressive medical management. Up titration of medications.   Recent Labs: 12/13/2014: Hemoglobin 13.3; Platelets 129.0* 12/24/2014: BUN 22; Creatinine, Ser 1.29; Potassium 3.5; Sodium 141; TSH 2.64    Lipid Panel No results found for: CHOL, TRIG, HDL, CHOLHDL, VLDL, LDLCALC, LDLDIRECT    Wt Readings from Last 3 Encounters:  01/31/15 179 lb 12.8 oz (81.557 kg)  01/01/15 174 lb 1.9 oz (78.98 kg)  12/24/14 180 lb 1.9 oz (81.702 kg)      Other studies Reviewed: Additional studies/ records that were reviewed today include: Lab work, notes, cath report Review of the above records demonstrates: As above   ASSESSMENT AND PLAN:  1.  Acute on chronic systolic heart failure/dilated cardiomyopathy-nonischemic cardiomyopathy  - LVEDP was 23  - Feeling much better, resolved symptoms. Dry weight currently 174 pounds.  - Last creatinine 1.26  - He does not show any overt signs of fluid overload.  - Losartan, angiotensin receptor blocker,  started by Dr. Felipa Eth on 01/22/15. He is doing very well with this medication. He is feeling great.  - Continue with potassium supplementation.  - Fluid restriction, salt restriction.  - Main symptom is orthopnea. Mild cough. This has improved.  - Blood pressure still remains quite low, unable to increase his beta blocker/angiotensin receptor blocker. Heart rate is improved however.  - Continue maintenance Lasix of 40 mg a day.  - Dr. Felipa Eth is going to check a basic metabolic profile at his return visit.  2. Secondary pulmonary hypertension  - Moderate, a result of left ventricular systolic dysfunction.  - Treat left-sided heart disease.  3. Mild to Moderate aortic regurgitation  - We will monitor clinically.     Current medicines are reviewed at length with the patient today.  The patient does not have concerns regarding medicines.  The following changes have been made:  no change  Labs/ tests ordered today include:   No orders of the defined  types were placed in this encounter.     Disposition:   FU with Archie Shea in 3 month,   Signed, [name], MD  01/31/2015 11:10 AM    [facility] [place], [place], [place]  [address] Phone: [phone]; Fax: [phone]

## 2015-03-06 DIAGNOSIS — Z7984 Long term (current) use of oral hypoglycemic drugs: Secondary | ICD-10-CM | POA: Diagnosis not present

## 2015-03-06 DIAGNOSIS — Z23 Encounter for immunization: Secondary | ICD-10-CM | POA: Diagnosis not present

## 2015-03-06 DIAGNOSIS — I5020 Unspecified systolic (congestive) heart failure: Secondary | ICD-10-CM | POA: Diagnosis not present

## 2015-03-06 DIAGNOSIS — Z79899 Other long term (current) drug therapy: Secondary | ICD-10-CM | POA: Diagnosis not present

## 2015-03-06 DIAGNOSIS — I10 Essential (primary) hypertension: Secondary | ICD-10-CM | POA: Diagnosis not present

## 2015-03-06 DIAGNOSIS — I429 Cardiomyopathy, unspecified: Secondary | ICD-10-CM | POA: Diagnosis not present

## 2015-03-06 DIAGNOSIS — E119 Type 2 diabetes mellitus without complications: Secondary | ICD-10-CM | POA: Diagnosis not present

## 2015-03-27 DIAGNOSIS — I5020 Unspecified systolic (congestive) heart failure: Secondary | ICD-10-CM | POA: Diagnosis not present

## 2015-05-06 ENCOUNTER — Ambulatory Visit (INDEPENDENT_AMBULATORY_CARE_PROVIDER_SITE_OTHER): Payer: Medicare Other | Admitting: Cardiology

## 2015-05-06 ENCOUNTER — Encounter: Payer: Self-pay | Admitting: Cardiology

## 2015-05-06 VITALS — BP 114/68 | HR 78 | Ht 71.0 in | Wt 177.2 lb

## 2015-05-06 DIAGNOSIS — I5022 Chronic systolic (congestive) heart failure: Secondary | ICD-10-CM | POA: Insufficient documentation

## 2015-05-06 DIAGNOSIS — I272 Other secondary pulmonary hypertension: Secondary | ICD-10-CM

## 2015-05-06 DIAGNOSIS — I351 Nonrheumatic aortic (valve) insufficiency: Secondary | ICD-10-CM

## 2015-05-06 DIAGNOSIS — IMO0002 Reserved for concepts with insufficient information to code with codable children: Secondary | ICD-10-CM

## 2015-05-06 MED ORDER — METOPROLOL SUCCINATE ER 25 MG PO TB24: 25.0000 mg | ORAL_TABLET | Freq: Every day | ORAL | Status: DC

## 2015-05-06 NOTE — Progress Notes (Signed)
Cardiology Office Note   Date:  05/06/2015   ID:  Carlos Gibson, DOB Feb 26, 1941, MRN [id number]  PCP:  Carlos Argyle, MD  Cardiologist:   Carlos Furbish, MD       History of Present Illness: Carlos Gibson is a 75 y.o. male who presents for nonischemic cardiomyopathy/ systolic heart failure. Had cardiac catheterization which showed normal coronary arteries, nonischemic cardiomyopathy, ejection fraction in the 25% range. His left ventricular end-diastolic pressure was also elevated in the mid 20s. Pulmonary pressures were moderately elevated.  Cough has improved, symptoms have improved after diuresing with 40 mg twice a day of Lasix.   We are going to decrease his Lasix to once a day 40 mg. He has felt some mild dizziness recently after more aggressive diuresis.  On original presentation, his ejection fraction was found to be 40% in July 2016. 8 pounds fluid gain was noted. BNP was 1410. He had improvements after Lasix ministration according to Dr. Felipa Eth previously. Lisinopril was tried but ACE inhibitor cough was produced.  Back in 2014 his ejection fraction was low normal at 50-55%, aortic root was 4 cm. Has a history of PVCs. Previously worked at [employer].  01/31/15-feels great. No longer having any shortness of breath. Doing very well. Dr. Felipa Eth on 01/22/15 started losartan 25 mg. He is feeling much better. Continue with current medication. No change with Lasix. See below for details. No dizziness. His wife is accompanying him on this visit.  05/06/15 -    Past Medical History  Diagnosis Date  . Bifascicular block   . Right BBB/left post fasc block   . Diabetes ([place])   . Hyperlipidemia   . Frequent PVCs   . Ejection fraction < 50%   . Aortic valve calcification   . Aortic valve regurgitation   . Aortic root dilation (HCC)   . Mild diastolic dysfunction   . Gilbert's syndrome   . Thrombocytopenia ([place])   . Cardiomyopathy [facility])     Past Surgical History    Procedure Laterality Date  . Cardiac catheterization N/A 12/20/2014    Procedure: Right/Left Heart Cath and Coronary Angiography;  Surgeon: Carlos Pain, MD;  Location: [place] [facility];  Service: Cardiovascular;  Laterality: N/A;     Current Outpatient Prescriptions  Medication Sig Dispense Refill  . alfuzosin (UROXATRAL) 10 MG 24 hr tablet Take 10 mg by mouth daily.  3  . aspirin EC 81 MG tablet Take 81 mg by mouth daily.    [name] atorvastatin (LIPITOR) 20 MG tablet Take 20 mg by mouth daily.  8  . calcium-vitamin D (OSCAL WITH D) 500-200 MG-UNIT per tablet Take 1 tablet by mouth daily.     . carvedilol (COREG) 3.125 MG tablet Take 1 tablet (3.125 mg total) by mouth 2 (two) times daily. 60 tablet 6  . finasteride (PROSCAR) 5 MG tablet Take 5 mg by mouth daily.   2  . furosemide (LASIX) 20 MG tablet Take 20 mg by mouth 2 (two) times daily.    [name] losartan (COZAAR) 25 MG tablet Take 1 tablet (25 mg total) by mouth daily. 30 tablet 6  . metFORMIN (GLUCOPHAGE) 500 MG tablet Take 1,000 mg by mouth 2 (two) times daily.  3  . potassium chloride SA (K-DUR,KLOR-CON) 20 MEQ tablet Take 1 tablet (20 mEq total) by mouth daily. 30 tablet 6   No current facility-administered medications for this visit.    Allergies:   Ace inhibitors cough   Social  History:  The patient  reports that he has never smoked. He does not have any smokeless tobacco history on file.   Family History:  The patient's family history includes Hypertension in his mother; Irregular heart beat in his father; Stroke in his mother; Unexplained death in his father.    ROS:  Please see the history of present illness.   Otherwise, review of systems are positive for none.   All other systems are reviewed and negative.    PHYSICAL EXAM: VS:  BP 114/68 mmHg  Pulse 78  Ht 5\' 11"  (1.803 m)  Wt 177 lb 3.2 oz (80.377 kg)  BMI 24.73 kg/m2 , BMI Body mass index is 24.73 kg/(m^2). GEN: Well nourished, well developed, in no acute  distress HEENT: normal Neck: no JVD, carotid bruits, or masses Cardiac: RRR; no murmurs, rubs, + S3 gallops, no edema  Respiratory:  clear to auscultation bilaterally, normal work of breathing GI: soft, nontender, nondistended, + BS MS: no deformity or atrophy Skin: warm and dry, no rash Neuro:  Strength and sensation are intact Psych: euthymic mood, full affect   EKG:  Not done today  CATH: 12/20/14  There is severe left ventricular systolic dysfunction.  Ejection fraction between 25-30% with global hypokinesis  Moderately elevated pulmonary pressures-mean PA pressure 35 mmHg consistent with secondary pulmonary hypertension as a result of increased left ventricular end-diastolic pressure  No angiographically significant coronary artery disease present.  Nonischemic cardiomyopathy. Left ventricular ejection fraction 25-30%. Left ventricular end-diastolic pressure 24 mmHg. Cardiac output 3.7 L/m with cardiac index of 1.8.  We'll continue with aggressive medical management. Up titration of medications.   Recent Labs: 12/13/2014: Hemoglobin 13.3; Platelets 129.0* 12/24/2014: BUN 22; Creatinine, Ser 1.29; Potassium 3.5; Sodium 141; TSH 2.64    Lipid Panel No results found for: CHOL, TRIG, HDL, CHOLHDL, VLDL, LDLCALC, LDLDIRECT    Wt Readings from Last 3 Encounters:  05/06/15 177 lb 3.2 oz (80.377 kg)  01/31/15 179 lb 12.8 oz (81.557 kg)  01/01/15 174 lb 1.9 oz (78.98 kg)      Other studies Reviewed: Additional studies/ records that were reviewed today include: Lab work, notes, cath report Review of the above records demonstrates: As above   ASSESSMENT AND PLAN:  1.  chronic systolic heart failure/dilated cardiomyopathy-nonischemic cardiomyopathy  - LVEDP was 23  -  States that he is having side effects on carvedilol such as blurry vision , shortness of breath, cough , feels poor. We will switch to Toprol 25 mg once a day. He was only on 3.125 mg twice a day of  carvedilol.  -  Intermittently will feel some shortness of breath. He states that he is no longer taking his Lasix 40 mg twice a day. He is only taking 20 mg 1 today. His weight seems fairly stable.. Dry weight currently 174 pounds.  - Last creatinine 1.29  - He does not show any overt signs of fluid overload.  - Losartan, angiotensin receptor blocker, started by Dr. Felipa Eth on 01/22/15. He is doing very well with this medication.   - Continue with potassium supplementation.  - Fluid restriction, salt restriction.  - Main symptom is orthopnea. Mild cough. This has improved but intermittently gets worse. After walking 6 blocks, he feels tired..  - Blood pressure still remains quite low, unable to increase his beta blocker/angiotensin receptor blocker. Heart rate is improved however.  - Continue maintenance Lasix of 20 mg QD (incerased after BNP 1150.  20mg  once a day.   -  Dr. Felipa Eth is going to check a basic metabolic profile at his return visit.  -  Checking echocardiogram. Last one was in July 2016.  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 Secondary pulmonary hypertension  - Moderate, a result of left ventricular systolic dysfunction.  - Treat left-sided heart disease.  3. Mild to Moderate aortic regurgitation  - We will monitor clinically.   Current medicines are reviewed at length with the patient today.  The patient does not have concerns regarding medicines.  The following changes have been made:  no change  Labs/ tests ordered today include:   No orders of the defined types were placed in this encounter.     Disposition:   FU with Skains in 1 month,   Signed, Carlos Furbish, MD  05/06/2015 9:52 AM    [facility] [place], [place],   [address] Phone: [phone]; Fax: [phone]

## 2015-05-06 NOTE — Patient Instructions (Signed)
Medication Instructions:  STOP CARVEDILOL START   METOPROLOL  25 MG  1 TAB  DAILY   Labwork: NONE  Testing/Procedures: Your physician has requested that you have an echocardiogram. Echocardiography is a painless test that uses sound waves to create images of your heart. It provides your doctor with information about the size and shape of your heart and how well your heart's chambers and valves are working. This procedure takes approximately one hour. There are no restrictions for this procedure.    Follow-Up: Your physician recommends that you schedule a follow-up appointment in:  1 MONTH WITH   DR Marlou Porch  Any Other Special Instructions Will Be Listed Below (If Applicable).     If you need a refill on your cardiac medications before your next appointment, please call your pharmacy.

## 2015-05-10 DIAGNOSIS — Z7984 Long term (current) use of oral hypoglycemic drugs: Secondary | ICD-10-CM | POA: Diagnosis not present

## 2015-05-10 DIAGNOSIS — Z0000 Encounter for general adult medical examination without abnormal findings: Secondary | ICD-10-CM | POA: Diagnosis not present

## 2015-05-10 DIAGNOSIS — E119 Type 2 diabetes mellitus without complications: Secondary | ICD-10-CM | POA: Diagnosis not present

## 2015-05-10 DIAGNOSIS — I5020 Unspecified systolic (congestive) heart failure: Secondary | ICD-10-CM | POA: Diagnosis not present

## 2015-05-10 DIAGNOSIS — I10 Essential (primary) hypertension: Secondary | ICD-10-CM | POA: Diagnosis not present

## 2015-05-10 DIAGNOSIS — Z1389 Encounter for screening for other disorder: Secondary | ICD-10-CM | POA: Diagnosis not present

## 2015-05-10 DIAGNOSIS — D696 Thrombocytopenia, unspecified: Secondary | ICD-10-CM | POA: Diagnosis not present

## 2015-05-10 DIAGNOSIS — E7800 Pure hypercholesterolemia, unspecified: Secondary | ICD-10-CM | POA: Diagnosis not present

## 2015-05-10 DIAGNOSIS — I351 Nonrheumatic aortic (valve) insufficiency: Secondary | ICD-10-CM | POA: Diagnosis not present

## 2015-05-10 DIAGNOSIS — Z79899 Other long term (current) drug therapy: Secondary | ICD-10-CM | POA: Diagnosis not present

## 2015-05-20 ENCOUNTER — Other Ambulatory Visit: Payer: Self-pay

## 2015-05-20 ENCOUNTER — Ambulatory Visit (HOSPITAL_COMMUNITY): Payer: Medicare Other | Attending: Internal Medicine

## 2015-05-20 ENCOUNTER — Telehealth: Payer: Self-pay | Admitting: Cardiology

## 2015-05-20 DIAGNOSIS — E785 Hyperlipidemia, unspecified: Secondary | ICD-10-CM | POA: Insufficient documentation

## 2015-05-20 DIAGNOSIS — I351 Nonrheumatic aortic (valve) insufficiency: Secondary | ICD-10-CM | POA: Insufficient documentation

## 2015-05-20 DIAGNOSIS — I517 Cardiomegaly: Secondary | ICD-10-CM | POA: Diagnosis not present

## 2015-05-20 DIAGNOSIS — I313 Pericardial effusion (noninflammatory): Secondary | ICD-10-CM | POA: Diagnosis not present

## 2015-05-20 DIAGNOSIS — I5021 Acute systolic (congestive) heart failure: Secondary | ICD-10-CM | POA: Diagnosis present

## 2015-05-20 DIAGNOSIS — I340 Nonrheumatic mitral (valve) insufficiency: Secondary | ICD-10-CM | POA: Insufficient documentation

## 2015-05-20 DIAGNOSIS — I5022 Chronic systolic (congestive) heart failure: Secondary | ICD-10-CM | POA: Diagnosis not present

## 2015-05-20 DIAGNOSIS — I10 Essential (primary) hypertension: Secondary | ICD-10-CM | POA: Diagnosis not present

## 2015-05-20 NOTE — Telephone Encounter (Signed)
Walk in pt form-pt needs to apeak with nurse- gave to Message nurse

## 2015-05-22 ENCOUNTER — Other Ambulatory Visit: Payer: Self-pay | Admitting: Cardiology

## 2015-05-23 ENCOUNTER — Ambulatory Visit (INDEPENDENT_AMBULATORY_CARE_PROVIDER_SITE_OTHER): Payer: Medicare Other | Admitting: Cardiology

## 2015-05-23 ENCOUNTER — Encounter: Payer: Self-pay | Admitting: Cardiology

## 2015-05-23 VITALS — BP 116/72 | HR 88 | Ht 71.0 in | Wt 179.4 lb

## 2015-05-23 DIAGNOSIS — I272 Other secondary pulmonary hypertension: Secondary | ICD-10-CM

## 2015-05-23 DIAGNOSIS — I4510 Unspecified right bundle-branch block: Secondary | ICD-10-CM

## 2015-05-23 DIAGNOSIS — I5022 Chronic systolic (congestive) heart failure: Secondary | ICD-10-CM | POA: Diagnosis not present

## 2015-05-23 DIAGNOSIS — IMO0002 Reserved for concepts with insufficient information to code with codable children: Secondary | ICD-10-CM

## 2015-05-23 NOTE — Patient Instructions (Signed)
Medication Instructions:  Please stop your Metoprolol. Continue all other medications as listed.  Follow-Up: Follow up in 6 months with Dr. Marlou Porch.  You will receive a letter in the mail 2 months before you are due.  Please call us when you receive this letter to schedule your follow up appointment.  If you need a refill on your cardiac medications before your next appointment, please call your pharmacy.  Thank you for choosing [facility]!!

## 2015-05-23 NOTE — Progress Notes (Addendum)
Cardiology Office Note   Date:  05/23/2015   ID:  Carlos Gibson, DOB 1940/10/21, MRN [id number]  PCP:  [name], MD  Cardiologist:   [name], MD       History of Present Illness: Carlos Gibson is a 75 y.o. male who presents for nonischemic cardiomyopathy/ systolic heart failure. Had cardiac catheterization which showed normal coronary arteries, nonischemic cardiomyopathy, ejection fraction in the 25% range. His left ventricular end-diastolic pressure was also elevated in the mid 20s. Pulmonary pressures were moderately elevated.  Cough has improved, symptoms have improved after diuresing with 40 mg twice a day of Lasix.   He has felt some mild dizziness recently after more aggressive diuresis.  On original presentation, his ejection fraction was found to be 40% in July 2016. 8 pounds fluid gain was noted. BNP was 1410. He had improvements after Lasix ministration according to Dr. Felipa Eth previously. Lisinopril was tried but ACE inhibitor cough was produced.  Back in 2014 his ejection fraction was low normal at 50-55%, aortic root was 4 cm. Has a history of PVCs. Previously worked at [employer].     Past Medical History  Diagnosis Date  . Bifascicular block   . Right BBB/left post fasc block   . Diabetes ([place])   . Hyperlipidemia   . Frequent PVCs   . Ejection fraction < 50%   . Aortic valve calcification   . Aortic valve regurgitation   . Aortic root dilation (HCC)   . Mild diastolic dysfunction   . Gilbert's syndrome   . Thrombocytopenia ([place])   . Cardiomyopathy [facility])     Past Surgical History  Procedure Laterality Date  . Cardiac catheterization N/A 12/20/2014    Procedure: Right/Left Heart Cath and Coronary Angiography;  Surgeon: Jerline Pain, MD;  Location: [place] [facility];  Service: Cardiovascular;  Laterality: N/A;     Current Outpatient Prescriptions  Medication Sig Dispense Refill  . alfuzosin (UROXATRAL) 10 MG 24 hr tablet Take 10 mg  by mouth daily.  3  . aspirin EC 81 MG tablet Take 81 mg by mouth daily.    [name] atorvastatin (LIPITOR) 20 MG tablet Take 20 mg by mouth daily.  8  . calcium-vitamin D (OSCAL WITH D) 500-200 MG-UNIT per tablet Take 1 tablet by mouth daily.     . finasteride (PROSCAR) 5 MG tablet Take 5 mg by mouth daily.   2  . furosemide (LASIX) 20 MG tablet Take 20 mg by mouth daily.     [name] losartan (COZAAR) 25 MG tablet TAKE 1 TABLET(25 MG) BY MOUTH DAILY 30 tablet 11  . metFORMIN (GLUCOPHAGE) 500 MG tablet Take 1,000 mg by mouth 2 (two) times daily.  3  . potassium chloride SA (K-DUR,KLOR-CON) 20 MEQ tablet Take 1 tablet (20 mEq total) by mouth daily. 30 tablet 6   No current facility-administered medications for this visit.    Allergies:   Ace inhibitors; Coreg; and Toprol xl cough   Social History:  The patient  reports that he has never smoked. He does not have any smokeless tobacco history on file.   Family History:  The patient's family history includes Hypertension in his mother; Irregular heart beat in his father; Stroke in his mother; Unexplained death in his father.    ROS:  Please see the history of present illness.   Otherwise, review of systems are positive for none.   All other systems are reviewed and negative.    PHYSICAL EXAM:  VS:  BP 116/72 mmHg  Pulse 88  Ht 5\' 11"  (1.803 m)  Wt 179 lb 6.4 oz (81.375 kg)  BMI 25.03 kg/m2 , BMI Body mass index is 25.03 kg/(m^2). GEN: Well nourished, well developed, in no acute distress HEENT: normal Neck: no JVD, carotid bruits, or masses Cardiac: RRR; no murmurs, rubs, + S3 gallops, no edema  Respiratory:  clear to auscultation bilaterally, normal work of breathing GI: soft, nontender, nondistended, + BS MS: no deformity or atrophy Skin: warm and dry, no rash Neuro:  Strength and sensation are intact Psych: euthymic mood, full affect   EKG:  Not done today  CATH: 12/20/14  There is severe left ventricular systolic  dysfunction.  Ejection fraction between 25-30% with global hypokinesis  Moderately elevated pulmonary pressures-mean PA pressure 35 mmHg consistent with secondary pulmonary hypertension as a result of increased left ventricular end-diastolic pressure  No angiographically significant coronary artery disease present.  Nonischemic cardiomyopathy. Left ventricular ejection fraction 25-30%. Left ventricular end-diastolic pressure 24 mmHg. Cardiac output 3.7 L/m with cardiac index of 1.8.  We'll continue with aggressive medical management. Up titration of medications.    Recent Labs: 12/13/2014: Hemoglobin 13.3; Platelets 129.0* 12/24/2014: BUN 22; Creatinine, Ser 1.29; Potassium 3.5; Sodium 141; TSH 2.64    Lipid Panel No results found for: CHOL, TRIG, HDL, CHOLHDL, VLDL, LDLCALC, LDLDIRECT    Wt Readings from Last 3 Encounters:  05/23/15 179 lb 6.4 oz (81.375 kg)  05/06/15 177 lb 3.2 oz (80.377 kg)  01/31/15 179 lb 12.8 oz (81.557 kg)      Other studies Reviewed: Additional studies/ records that were reviewed today include: Lab work, notes, cath report  Echocardiogram 05/20/15: - Left ventricle: The cavity size was mildly dilated. Wall thickness was increased in a pattern of mild LVH. Systolic function was moderately reduced. The estimated ejection fraction was in the range of 35% to 40%. - Aortic valve: There was mild regurgitation. - Mitral valve: MR is eccentric, directed along posterior wall of left atrium. Calcified annulus. Moderately thickened leaflets . There was mild regurgitation. - Left atrium: The atrium was severely dilated. - Pulmonary arteries: PA peak pressure: 56 mm Hg (S). Review of the above records demonstrates: As above   ASSESSMENT AND PLAN:  Chronic systolic heart failure/dilated cardiomyopathy  -nonischemic cardiomyopathy  - LVEDP was 23 during cardiac catheterization  - having intolerance to Toprol, carvedilol. We will stop low-dose beta  blocker. Dry weight currently 174 pounds.  - Last creatinine 1.29  - He does not show any overt signs of fluid overload. continue with Lasix   - Losartan, angiotensin receptor blocker, started by Dr. Felipa Eth on 01/22/15. He is doing very well with this medication.   - Continue with potassium supplementation.  - Fluid restriction, salt restriction.  - Main symptom was orthopnea. Mild cough. This has improved  - Blood pressure still remains quite low, unable to increase his beta. Daughter checks at home. Continue angiotensin receptor blocker.    -explained that ultimately we would love for him to be on a beta blocker as well as angiotensin receptor blocker but some patients aren't able to tolerate the medications.   Secondary pulmonary hypertension  - Moderate, a result of left ventricular systolic dysfunction.  - Treat left-sided heart disease.  Mild to Moderate aortic regurgitation  - We will monitor clinically.   Current medicines are reviewed at length with the patient today.  The patient does not have concerns regarding medicines.  The following changes have been  made:  no change  Labs/ tests ordered today include:   No orders of the defined types were placed in this encounter.     Disposition:   FU with [facility] in 6 month,   Addendum: He was seen in the emergency department 08/2015. Received IV Lasix, proximal by 700 mL diuresis and felt much better from shortness of breath perspective. He admitted that he was drinking a gallon of water a day. Fluid restrictions were encouraged.  Bobby Rumpf, MD  05/23/2015 10:58 AM    [facility] [place], [place], [place]  [address] Phone: [phone]; Fax: [phone]

## 2015-05-28 ENCOUNTER — Ambulatory Visit: Payer: Medicare Other | Admitting: Cardiology

## 2015-06-12 DIAGNOSIS — E119 Type 2 diabetes mellitus without complications: Secondary | ICD-10-CM | POA: Diagnosis not present

## 2015-06-12 DIAGNOSIS — Z7984 Long term (current) use of oral hypoglycemic drugs: Secondary | ICD-10-CM | POA: Diagnosis not present

## 2015-06-12 DIAGNOSIS — I10 Essential (primary) hypertension: Secondary | ICD-10-CM | POA: Diagnosis not present

## 2015-06-12 DIAGNOSIS — R1900 Intra-abdominal and pelvic swelling, mass and lump, unspecified site: Secondary | ICD-10-CM | POA: Diagnosis not present

## 2015-07-06 ENCOUNTER — Other Ambulatory Visit: Payer: Self-pay | Admitting: Cardiology

## 2015-07-09 MED ORDER — FUROSEMIDE 20 MG PO TABS: 20.0000 mg | ORAL_TABLET | Freq: Every day | ORAL | Status: DC

## 2015-07-21 ENCOUNTER — Telehealth: Payer: Self-pay | Admitting: Physician Assistant

## 2015-07-21 NOTE — Telephone Encounter (Signed)
Paged by Answering service, Patient's friend Thornell Mule) called stating that patient has been having worsening shortness of breath and lower extremity edema for the past few days. Patient's brother died few days ago and he went to visit and  orgot to take his Lasix at that time. He started taking Lasix yesterday.However worsening shortness of breath.The above information provided by patient's friend who lives nearby to patient's house.  I have called patient twice however unable to reach. I advise Thornell Mule To go check his friend Mr. Foerst personally. If he has been having worsening shortness of breath take him to the emergency room or urgent care for further evaluation. Otherwise call EMS if patient is sick or does not look good (sick).  Jamielynn Wigley, [place]

## 2015-07-22 ENCOUNTER — Telehealth: Payer: Self-pay | Admitting: Cardiology

## 2015-07-22 NOTE — Telephone Encounter (Signed)
Called patient.  Advised that Dr Marlou Porch would like for him to double his Lasix from 20mg  to 40mg  for the next 3 days and to call back if not improved.  Patient expressed agreement.

## 2015-07-22 NOTE — Telephone Encounter (Signed)
Patient caled.  Patient says he's been having SOB with some dizziness since Saturday.  No CP, some leg swelling.  Patient was off of his Lasix 20mg  for 4 days.  Patient rode in a car to [place] last week, forgot to take his lasix with him. Hand leg swelling and SOB Saturday when he came back.  Started his Lasix back then.  Swelling is down some now but still some SOB.

## 2015-07-22 NOTE — Telephone Encounter (Signed)
Called and spoke with patient.  Explained we do not have a DPR on file to speak with Carlos Gibson about his care.  He states he is following the orders that were given earlier to increase Furosemide to 40 mg for 3 days and then resume normal dose.  He will call back if not feeling improvement after 3 days.

## 2015-07-22 NOTE — Telephone Encounter (Signed)
New message    Pt c/o Shortness Of Breath: STAT if SOB developed within the last 24 hours or pt is noticeably SOB on the phone  1. Are you currently SOB (can you hear that pt is SOB on the phone)? Yes   2. How long have you been experiencing SOB?   Saturday / Sunday   3. Are you SOB when sitting or when up moving around? Moving around   4. Are you currently experiencing any other symptoms? No

## 2015-07-22 NOTE — Telephone Encounter (Signed)
New message    Everlene Farrier - friend of patient calling - patient spoke with triage nurse this am  - patient aware to take his lasix.     Everlene Farrier verbalized she wants Dr. Marlou Porch to call her b/c patient still having problems, he needs to come into the office an be seen.

## 2015-07-24 DIAGNOSIS — N138 Other obstructive and reflux uropathy: Secondary | ICD-10-CM | POA: Diagnosis not present

## 2015-07-24 DIAGNOSIS — Z0000 Encounter for general adult medical examination without abnormal findings: Secondary | ICD-10-CM | POA: Diagnosis not present

## 2015-07-24 DIAGNOSIS — N401 Enlarged prostate with lower urinary tract symptoms: Secondary | ICD-10-CM | POA: Diagnosis not present

## 2015-08-14 ENCOUNTER — Telehealth: Payer: Self-pay | Admitting: Cardiology

## 2015-08-14 NOTE — Telephone Encounter (Signed)
Spoke with pt about his c/o.  Having SOB when walking up stairs and having some edema and leg weakness.  About one month ago pt forgot his Furosemide for 4 days.  His Lasix was restarted at 40 mg for 4 days and he was then to resume 20 mg a day thereafter.  He reports he did take the 40 mg a day as instructed however he resumed only 10 mg a day after.  He has not been weighing daily and doesn't know if his wt is up.  He is requesting to have a CXR.  Advised those are not done here however he could contact his PCP to see if the PCP would want one.  Advised pt to increase Furosemide to 40 mg for 3 days then resume at 20 mg a day.  He is to watch his NA and fluid intake.  He is to call back if no improvement in Furosemide.  He was scheduled for f/u appt with Dr Marlou Porch next week.

## 2015-08-14 NOTE — Telephone Encounter (Signed)
Lm to cb to discuss concerns 

## 2015-08-14 NOTE — Telephone Encounter (Signed)
New message    Pt c/o swelling: STAT is pt has developed SOB within 24 hours  1. How long have you been experiencing swelling? About 2 weeks   2. Where is the swelling located? Ankles / legs  3.  Are you currently taking a "fluid pill"? Yes - every day    4.  Are you currently SOB? Yes at time - when walking around - yes   5.  Have you traveled recently?yes - went to [place] -  X 2 within a week apart - brother funeral

## 2015-08-15 ENCOUNTER — Emergency Department (HOSPITAL_COMMUNITY)
Admission: EM | Admit: 2015-08-15 | Discharge: 2015-08-15 | Disposition: A | Discharge status: Home / Self Care | Payer: Medicare Other | Attending: Emergency Medicine | Admitting: Emergency Medicine

## 2015-08-15 ENCOUNTER — Encounter (HOSPITAL_COMMUNITY): Payer: Self-pay | Admitting: Emergency Medicine

## 2015-08-15 ENCOUNTER — Emergency Department (HOSPITAL_COMMUNITY): Payer: Medicare Other

## 2015-08-15 DIAGNOSIS — Z7982 Long term (current) use of aspirin: Secondary | ICD-10-CM | POA: Diagnosis not present

## 2015-08-15 DIAGNOSIS — E119 Type 2 diabetes mellitus without complications: Secondary | ICD-10-CM | POA: Insufficient documentation

## 2015-08-15 DIAGNOSIS — I509 Heart failure, unspecified: Secondary | ICD-10-CM | POA: Insufficient documentation

## 2015-08-15 DIAGNOSIS — M7989 Other specified soft tissue disorders: Secondary | ICD-10-CM | POA: Diagnosis not present

## 2015-08-15 DIAGNOSIS — Z79899 Other long term (current) drug therapy: Secondary | ICD-10-CM | POA: Diagnosis not present

## 2015-08-15 DIAGNOSIS — Z7984 Long term (current) use of oral hypoglycemic drugs: Secondary | ICD-10-CM | POA: Diagnosis not present

## 2015-08-15 DIAGNOSIS — R42 Dizziness and giddiness: Secondary | ICD-10-CM | POA: Insufficient documentation

## 2015-08-15 DIAGNOSIS — R079 Chest pain, unspecified: Secondary | ICD-10-CM | POA: Diagnosis not present

## 2015-08-15 DIAGNOSIS — R0602 Shortness of breath: Secondary | ICD-10-CM | POA: Diagnosis present

## 2015-08-15 DIAGNOSIS — E785 Hyperlipidemia, unspecified: Secondary | ICD-10-CM | POA: Diagnosis not present

## 2015-08-15 LAB — CBC WITH DIFFERENTIAL/PLATELET
Basophils Absolute: 0 10*3/uL (ref 0.0–0.1)
Basophils Relative: 0 %
EOS ABS: 0.1 10*3/uL (ref 0.0–0.7)
EOS PCT: 3 %
HCT: 38.8 % — ABNORMAL LOW (ref 39.0–52.0)
Hemoglobin: 12.9 g/dL — ABNORMAL LOW (ref 13.0–17.0)
LYMPHS ABS: 1.7 10*3/uL (ref 0.7–4.0)
Lymphocytes Relative: 33 %
MCH: 28.4 pg (ref 26.0–34.0)
MCHC: 33.2 g/dL (ref 30.0–36.0)
MCV: 85.3 fL (ref 78.0–100.0)
MONOS PCT: 12 %
Monocytes Absolute: 0.6 10*3/uL (ref 0.1–1.0)
Neutro Abs: 2.6 10*3/uL (ref 1.7–7.7)
Neutrophils Relative %: 52 %
PLATELETS: 158 10*3/uL (ref 150–400)
RBC: 4.55 MIL/uL (ref 4.22–5.81)
RDW: 13.9 % (ref 11.5–15.5)
WBC: 5.1 10*3/uL (ref 4.0–10.5)

## 2015-08-15 LAB — URINALYSIS, ROUTINE W REFLEX MICROSCOPIC
Bilirubin Urine: NEGATIVE
GLUCOSE, UA: 250 mg/dL — AB
KETONES UR: NEGATIVE mg/dL
LEUKOCYTES UA: NEGATIVE
Nitrite: NEGATIVE
PROTEIN: NEGATIVE mg/dL
Specific Gravity, Urine: 1.011 (ref 1.005–1.030)
pH: 5.5 (ref 5.0–8.0)

## 2015-08-15 LAB — I-STAT TROPONIN, ED: TROPONIN I, POC: 0.03 ng/mL (ref 0.00–0.08)

## 2015-08-15 LAB — COMPREHENSIVE METABOLIC PANEL
ALT: 53 U/L (ref 17–63)
ANION GAP: 10 (ref 5–15)
AST: 30 U/L (ref 15–41)
Albumin: 3.6 g/dL (ref 3.5–5.0)
Alkaline Phosphatase: 56 U/L (ref 38–126)
BUN: 22 mg/dL — ABNORMAL HIGH (ref 6–20)
CHLORIDE: 107 mmol/L (ref 101–111)
CO2: 25 mmol/L (ref 22–32)
Calcium: 9.3 mg/dL (ref 8.9–10.3)
Creatinine, Ser: 1.56 mg/dL — ABNORMAL HIGH (ref 0.61–1.24)
GFR calc non Af Amer: 42 mL/min — ABNORMAL LOW (ref >60)
GFR, EST AFRICAN AMERICAN: 49 mL/min — AB (ref >60)
Glucose, Bld: 261 mg/dL — ABNORMAL HIGH (ref 65–99)
POTASSIUM: 4.4 mmol/L (ref 3.5–5.1)
SODIUM: 142 mmol/L (ref 135–145)
Total Bilirubin: 1.7 mg/dL — ABNORMAL HIGH (ref 0.3–1.2)
Total Protein: 6.2 g/dL — ABNORMAL LOW (ref 6.5–8.1)

## 2015-08-15 LAB — URINE MICROSCOPIC-ADD ON: BACTERIA UA: NONE SEEN

## 2015-08-15 LAB — BRAIN NATRIURETIC PEPTIDE: B Natriuretic Peptide: 1885.7 pg/mL — ABNORMAL HIGH (ref 0.0–100.0)

## 2015-08-15 MED ORDER — ALBUTEROL SULFATE (2.5 MG/3ML) 0.083% IN NEBU
5.0000 mg | INHALATION_SOLUTION | Freq: Once | RESPIRATORY_TRACT | Status: AC
  Administered 2015-08-15: 5 mg via RESPIRATORY_TRACT
  Filled 2015-08-15: qty 6

## 2015-08-15 MED ORDER — FUROSEMIDE 10 MG/ML IJ SOLN
80.0000 mg | Freq: Once | INTRAMUSCULAR | Status: AC
  Administered 2015-08-15: 80 mg via INTRAVENOUS
  Filled 2015-08-15: qty 8

## 2015-08-15 NOTE — ED Provider Notes (Signed)
CSN: [id number]     Arrival date & time 08/15/15  1216 History   First MD Initiated Contact with Patient 08/15/15 1248     Chief Complaint  Patient presents with  . Shortness of Breath  . Joint Swelling  . Dizziness     (Consider location/radiation/quality/duration/timing/severity/associated sxs/prior Treatment) HPI Patient presents with increasing dyspnea with exertion for the past 3-4 weeks. Worsen when lying flat. Endorses nocturnal dyspnea. Has a dry nonproductive cough. Denies any chest pain. States he's had increased bilateral lower extremity swelling without pain. Was seen by his cardiologist Dr. Marlou Porch and started on Lasix. He is now taking 40 mg daily. He's had no improvement with the dyspnea but mild improvement in his lower extremity swelling. Denies any fever or chills. States he is drinking at least 1 gallon of water a day. He fills a gallon jug before leaving his house in the morning and drinks throughout the day. Past Medical History  Diagnosis Date  . Bifascicular block   . Right BBB/left post fasc block   . Diabetes ([place])   . Hyperlipidemia   . Frequent PVCs   . Ejection fraction < 50%   . Aortic valve calcification   . Aortic valve regurgitation   . Aortic root dilation ([facility])   . Mild diastolic dysfunction   . Gilbert's syndrome   . Thrombocytopenia ([place])   . Cardiomyopathy [facility])    Past Surgical History  Procedure Laterality Date  . Cardiac catheterization N/A 12/20/2014    Procedure: Right/Left Heart Cath and Coronary Angiography;  Surgeon: Jerline Pain, MD;  Location: [facility];  Service: Cardiovascular;  Laterality: N/A;   Family History  Problem Relation Age of Onset  . Hypertension Mother   . Stroke Mother   . Irregular heart beat Father   . Unexplained death Father    Social History  Substance Use Topics  . Smoking status: Never Smoker   . Smokeless tobacco: None  . Alcohol Use: None    Review of Systems  Constitutional: Negative for  fever and chills.  Respiratory: Positive for cough and shortness of breath. Negative for wheezing.   Cardiovascular: Positive for leg swelling. Negative for chest pain and palpitations.  Gastrointestinal: Negative for nausea, vomiting, abdominal pain, diarrhea and constipation.  Genitourinary: Positive for frequency. Negative for dysuria, hematuria and flank pain.  Musculoskeletal: Negative for myalgias, neck pain and neck stiffness.  Skin: Negative for rash and wound.  Neurological: Positive for dizziness and light-headedness. Negative for weakness, numbness and headaches.  All other systems reviewed and are negative.     Allergies  Ace inhibitors; Coreg; and Toprol xl  Home Medications   Prior to Admission medications   Medication Sig Start Date End Date Taking? Authorizing Provider  alfuzosin (UROXATRAL) 10 MG 24 hr tablet Take 10 mg by mouth daily. 01/18/14  Yes Historical Provider, MD  aspirin EC 81 MG tablet Take 81 mg by mouth daily.   Yes Historical Provider, MD  atorvastatin (LIPITOR) 20 MG tablet Take 20 mg by mouth daily. 01/18/14  Yes Historical Provider, MD  calcium-vitamin D (OSCAL WITH D) 500-200 MG-UNIT per tablet Take 1 tablet by mouth daily.    Yes Historical Provider, MD  finasteride (PROSCAR) 5 MG tablet Take 5 mg by mouth daily.  02/08/14  Yes Historical Provider, MD  furosemide (LASIX) 20 MG tablet Take 1 tablet (20 mg total) by mouth daily. Patient taking differently: Take 40 mg by mouth daily.  07/09/15  Yes Elta Guadeloupe  Lurline Del, MD  losartan (COZAAR) 25 MG tablet TAKE 1 TABLET(25 MG) BY MOUTH DAILY 05/22/15  Yes Jerline Pain, MD  metFORMIN (GLUCOPHAGE) 500 MG tablet Take 500 mg by mouth 2 (two) times daily.  01/05/14  Yes Historical Provider, MD  potassium chloride SA (K-DUR,KLOR-CON) 20 MEQ tablet Take 1 tablet (20 mEq total) by mouth daily. 12/21/14  Yes Jerline Pain, MD   BP 142/80 mmHg  Pulse 100  Temp(Src) 97.5 F (36.4 C) (Oral)  Resp 30  Ht 5\' 11"  (1.803 m)   Wt 186 lb (84.369 kg)  BMI 25.95 kg/m2  SpO2 100% Physical Exam  Constitutional: He is oriented to person, place, and time. He appears well-developed and well-nourished. No distress.  HENT:  Head: Normocephalic and atraumatic.  Mouth/Throat: Oropharynx is clear and moist. No oropharyngeal exudate.  Eyes: EOM are normal. Pupils are equal, round, and reactive to light.  Neck: Normal range of motion. Neck supple. JVD present.  Cardiovascular: Normal rate and regular rhythm.  Exam reveals no gallop and no friction rub.   No murmur heard. Pulmonary/Chest: Effort normal and breath sounds normal. No respiratory distress. He has no wheezes. He has no rales. He exhibits no tenderness.  Mild tachypnea  Abdominal: Soft. Bowel sounds are normal. He exhibits no distension and no mass. There is no tenderness. There is no rebound and no guarding.  Musculoskeletal: Normal range of motion. He exhibits edema. He exhibits no tenderness.  2+ bilateral lower extremity pitting edema. Distal pulses are symmetric. No calf tenderness or asymmetry.  Neurological: He is alert and oriented to person, place, and time.  Skin: Skin is warm and dry. No rash noted. No erythema.  Psychiatric: He has a normal mood and affect. His behavior is normal.  Nursing note and vitals reviewed.   ED Course  Procedures (including critical care time) Labs Review Labs Reviewed  CBC WITH DIFFERENTIAL/PLATELET - Abnormal; Notable for the following:    Hemoglobin 12.9 (*)    HCT 38.8 (*)    All other components within normal limits  COMPREHENSIVE METABOLIC PANEL - Abnormal; Notable for the following:    Glucose, Bld 261 (*)    BUN 22 (*)    Creatinine, Ser 1.56 (*)    Total Protein 6.2 (*)    Total Bilirubin 1.7 (*)    GFR calc non Af Amer 42 (*)    GFR calc Af Amer 49 (*)    All other components within normal limits  BRAIN NATRIURETIC PEPTIDE - Abnormal; Notable for the following:    B Natriuretic Peptide 1885.7 (*)    All  other components within normal limits  URINALYSIS, ROUTINE W REFLEX MICROSCOPIC (NOT AT [facility]) - Abnormal; Notable for the following:    Glucose, UA 250 (*)    Hgb urine dipstick TRACE (*)    All other components within normal limits  URINE MICROSCOPIC-ADD ON - Abnormal; Notable for the following:    Squamous Epithelial / LPF 0-5 (*)    All other components within normal limits  I-STAT TROPOININ, ED    Imaging Review Dg Chest 2 View  08/15/2015  CLINICAL DATA:  Chest pain EXAM: CHEST  2 VIEW COMPARISON:  10/24/2014 FINDINGS: Cardiomegaly is noted. No acute infiltrate or pleural effusion. No pulmonary edema. Bony thorax is unremarkable. IMPRESSION: No active cardiopulmonary disease.  Cardiomegaly. Electronically Signed   By: Lahoma Crocker M.D.   On: 08/15/2015 12:46   I have personally reviewed and evaluated these images and lab  results as part of my medical decision-making.   EKG Interpretation   Date/Time:  Thursday Aug 15 2015 12:29:02 EDT Ventricular Rate:  85 PR Interval:  161 QRS Duration: 141 QT Interval:  398 QTC Calculation: 473 R Axis:   -75 Text Interpretation:  Ectopic atrial rhythm Ventricular premature complex  RBBB and LAFB Left ventricular hypertrophy Confirmed by Inice Sanluis  MD,  Rayann Jolley ([id number]) on 08/15/2015 1:02:17 PM      MDM   Final diagnoses:  Acute on chronic congestive heart failure, unspecified congestive heart failure type ([place])    Patient with volume overload likely secondary to underlying CHF and excessive fluid intake.  Patient has diuresed roughly 700 mls. States he is feeling much better. His breathing has improved.  Julianne Rice, MD 08/15/15 1710

## 2015-08-15 NOTE — ED Notes (Signed)
Pt, being sent by PCP, c/o SOB, ankle swelling, and low back pain x 2 weeks.  Hx of CHF, HTN, and DM.  Pt reports increased SOB w/ exertion.

## 2015-08-15 NOTE — Discharge Instructions (Signed)
Decrease your water intake. Take her Lasix as previously prescribed. Follow-up with your cardiologist. Return immediately for chest pain, worsening shortness of breath over a concerns.   Edema Edema is an abnormal buildup of fluids in your bodytissues. Edema is somewhatdependent on gravity to pull the fluid to the lowest place in your body. That makes the condition more common in the legs and thighs (lower extremities). Painless swelling of the feet and ankles is common and becomes more likely as you get older. It is also common in looser tissues, like around your eyes.  When the affected area is squeezed, the fluid may move out of that spot and leave a dent for a few moments. This dent is called pitting.  CAUSES  There are many possible causes of edema. Eating too much salt and being on your feet or sitting for a long time can cause edema in your legs and ankles. Hot weather may make edema worse. Common medical causes of edema include:  Heart failure.  Liver disease.  Kidney disease.  Weak blood vessels in your legs.  Cancer.  An injury.  Pregnancy.  Some medications.  Obesity. SYMPTOMS  Edema is usually painless.Your skin may look swollen or shiny.  DIAGNOSIS  Your health care provider may be able to diagnose edema by asking about your medical history and doing a physical exam. You may need to have tests such as X-rays, an electrocardiogram, or blood tests to check for medical conditions that may cause edema.  TREATMENT  Edema treatment depends on the cause. If you have heart, liver, or kidney disease, you need the treatment appropriate for these conditions. General treatment may include:  Elevation of the affected body part above the level of your heart.  Compression of the affected body part. Pressure from elastic bandages or support stockings squeezes the tissues and forces fluid back into the blood vessels. This keeps fluid from entering the tissues.  Restriction of  fluid and salt intake.  Use of a water pill (diuretic). These medications are appropriate only for some types of edema. They pull fluid out of your body and make you urinate more often. This gets rid of fluid and reduces swelling, but diuretics can have side effects. Only use diuretics as directed by your health care provider. HOME CARE INSTRUCTIONS   Keep the affected body part above the level of your heart when you are lying down.   Do not sit still or stand for prolonged periods.   Do not put anything directly under your knees when lying down.  Do not wear constricting clothing or garters on your upper legs.   Exercise your legs to work the fluid back into your blood vessels. This may help the swelling go down.   Wear elastic bandages or support stockings to reduce ankle swelling as directed by your health care provider.   Eat a low-salt diet to reduce fluid if your health care provider recommends it.   Only take medicines as directed by your health care provider. SEEK MEDICAL CARE IF:   Your edema is not responding to treatment.  You have heart, liver, or kidney disease and notice symptoms of edema.  You have edema in your legs that does not improve after elevating them.   You have sudden and unexplained weight gain. SEEK IMMEDIATE MEDICAL CARE IF:   You develop shortness of breath or chest pain.   You cannot breathe when you lie down.  You develop pain, redness, or warmth in the swollen  areas.   You have heart, liver, or kidney disease and suddenly get edema.  You have a fever and your symptoms suddenly get worse. MAKE SURE YOU:   Understand these instructions.  Will watch your condition.  Will get help right away if you are not doing well or get worse.   This information is not intended to replace advice given to you by your health care provider. Make sure you discuss any questions you have with your health care provider.   Document Released:  03/23/2005 Document Revised: 04/13/2014 Document Reviewed: 01/13/2013 Elsevier Interactive Patient Education [employer].

## 2015-08-15 NOTE — ED Notes (Signed)
Patient here from home with complaints of SOB, bilateral ankle swelling, and dizziness x1 month. Seen by cardiologist prescribed Lasix with no relief. Reports that he can barely climb the stairs.

## 2015-08-20 ENCOUNTER — Encounter: Payer: Self-pay | Admitting: Cardiology

## 2015-08-20 ENCOUNTER — Ambulatory Visit (INDEPENDENT_AMBULATORY_CARE_PROVIDER_SITE_OTHER): Payer: Medicare Other | Admitting: Cardiology

## 2015-08-20 VITALS — BP 110/62 | HR 74 | Ht 71.0 in | Wt 170.0 lb

## 2015-08-20 DIAGNOSIS — Z79899 Other long term (current) drug therapy: Secondary | ICD-10-CM | POA: Diagnosis not present

## 2015-08-20 DIAGNOSIS — I4510 Unspecified right bundle-branch block: Secondary | ICD-10-CM | POA: Diagnosis not present

## 2015-08-20 DIAGNOSIS — I5022 Chronic systolic (congestive) heart failure: Secondary | ICD-10-CM | POA: Diagnosis not present

## 2015-08-20 DIAGNOSIS — I272 Other secondary pulmonary hypertension: Secondary | ICD-10-CM | POA: Diagnosis not present

## 2015-08-20 DIAGNOSIS — IMO0002 Reserved for concepts with insufficient information to code with codable children: Secondary | ICD-10-CM

## 2015-08-20 LAB — BASIC METABOLIC PANEL
BUN: 15 mg/dL (ref 7–25)
CHLORIDE: 102 mmol/L (ref 98–110)
CO2: 29 mmol/L (ref 20–31)
CREATININE: 1.43 mg/dL — AB (ref 0.70–1.18)
Calcium: 9.5 mg/dL (ref 8.6–10.3)
Glucose, Bld: 175 mg/dL — ABNORMAL HIGH (ref 65–99)
Potassium: 3.8 mmol/L (ref 3.5–5.3)
Sodium: 140 mmol/L (ref 135–146)

## 2015-08-20 NOTE — Progress Notes (Signed)
Cardiology Office Note   Date:  08/20/2015   ID:  Carlos Gibson, DOB 1941/03/13, MRN [id number]  PCP:  [name], MD  Cardiologist:   [name], MD       History of Present Illness: Carlos Gibson is a 75 y.o. male who presents for nonischemic cardiomyopathy/ systolic heart failure. Had cardiac catheterization which showed normal coronary arteries, nonischemic cardiomyopathy, ejection fraction in the 25% range. His left ventricular end-diastolic pressure was also elevated in the mid 20s. Pulmonary pressures were moderately elevated.  Cough has improved, symptoms have improved after diuresing from emergency department. Daughter states that he was sick for up to 3 weeks prior to this. He was drinking lots of water. After emergency room visit 08/2015 he was taking Lasix 80 mg once a day. He has felt some mild dizziness recently after more aggressive diuresis.  On original presentation, his ejection fraction was found to be 40% in July 2016. 8 pounds fluid gain was noted. BNP was 1410. He had improvements after Lasix ministration according to Dr. Felipa Eth previously. Lisinopril was tried but ACE inhibitor cough was produced.  Back in 2014 his ejection fraction was low normal at 50-55%, aortic root was 4 cm. Has a history of PVCs. Previously worked at [employer].     Past Medical History  Diagnosis Date  . Bifascicular block   . Right BBB/left post fasc block   . Diabetes ([place])   . Hyperlipidemia   . Frequent PVCs   . Ejection fraction < 50%   . Aortic valve calcification   . Aortic valve regurgitation   . Aortic root dilation ([facility])   . Mild diastolic dysfunction   . Gilbert's syndrome   . Thrombocytopenia ([place])   . Cardiomyopathy [facility])     Past Surgical History  Procedure Laterality Date  . Cardiac catheterization N/A 12/20/2014    Procedure: Right/Left Heart Cath and Coronary Angiography;  Surgeon: Jerline Pain, MD;  Location: [facility];  Service:  Cardiovascular;  Laterality: N/A;     Current Outpatient Prescriptions  Medication Sig Dispense Refill  . alfuzosin (UROXATRAL) 10 MG 24 hr tablet Take 10 mg by mouth daily.  3  . aspirin EC 81 MG tablet Take 81 mg by mouth daily.    [name] atorvastatin (LIPITOR) 20 MG tablet Take 20 mg by mouth daily.  8  . calcium-vitamin D (OSCAL WITH D) 500-200 MG-UNIT per tablet Take 1 tablet by mouth daily.     . finasteride (PROSCAR) 5 MG tablet Take 5 mg by mouth daily.   2  . furosemide (LASIX) 40 MG tablet Take 2 tablets by mouth daily.    [name] losartan (COZAAR) 25 MG tablet TAKE 1 TABLET(25 MG) BY MOUTH DAILY 30 tablet 11  . metFORMIN (GLUCOPHAGE) 500 MG tablet Take 500 mg by mouth 2 (two) times daily.   3  . metoprolol succinate (TOPROL-XL) 25 MG 24 hr tablet Take 1 tablet by mouth daily.    . potassium chloride SA (K-DUR,KLOR-CON) 20 MEQ tablet Take 1 tablet (20 mEq total) by mouth daily. 30 tablet 6   No current facility-administered medications for this visit.    Allergies:   Ace inhibitors; Coreg; and Toprol xl cough   Social History:  The patient  reports that he has never smoked. He does not have any smokeless tobacco history on file.   Family History:  The patient's family history includes Hypertension in his mother; Irregular heart beat in his father;  Stroke in his mother; Unexplained death in his father.    ROS:  Please see the history of present illness.   Otherwise, review of systems are positive for none.   All other systems are reviewed and negative.    PHYSICAL EXAM: VS:  BP 110/62 mmHg  Pulse 74  Ht 5\' 11"  (1.803 m)  Wt 170 lb (77.111 kg)  BMI 23.72 kg/m2 , BMI Body mass index is 23.72 kg/(m^2). GEN: Well nourished, well developed, in no acute distress HEENT: normal Neck: no JVD, carotid bruits, or masses Cardiac: RRR; no murmurs, rubs, + S3 gallops, no edema  Respiratory:  clear to auscultation bilaterally, normal work of breathing, mild wheeze heard right lung base. GI:  soft, nontender, nondistended, + BS MS: no deformity or atrophy Skin: warm and dry, no rash Neuro:  Strength and sensation are intact Psych: euthymic mood, full affect   EKG:  Not done today  CATH: 12/20/14  There is severe left ventricular systolic dysfunction.  Ejection fraction between 25-30% with global hypokinesis  Moderately elevated pulmonary pressures-mean PA pressure 35 mmHg consistent with secondary pulmonary hypertension as a result of increased left ventricular end-diastolic pressure  No angiographically significant coronary artery disease present.  Nonischemic cardiomyopathy. Left ventricular ejection fraction 25-30%. Left ventricular end-diastolic pressure 24 mmHg. Cardiac output 3.7 L/m with cardiac index of 1.8.  We'll continue with aggressive medical management. Up titration of medications.    Recent Labs: 12/24/2014: TSH 2.64 08/15/2015: ALT 53; B Natriuretic Peptide 1885.7*; BUN 22*; Creatinine, Ser 1.56*; Hemoglobin 12.9*; Platelets 158; Potassium 4.4; Sodium 142    Lipid Panel No results found for: CHOL, TRIG, HDL, CHOLHDL, VLDL, LDLCALC, LDLDIRECT    Wt Readings from Last 3 Encounters:  08/20/15 170 lb (77.111 kg)  08/15/15 186 lb (84.369 kg)  05/23/15 179 lb 6.4 oz (81.375 kg)      Other studies Reviewed: Additional studies/ records that were reviewed today include: Lab work, notes, cath report  Echocardiogram 05/20/15: - Left ventricle: The cavity size was mildly dilated. Wall thickness was increased in a pattern of mild LVH. Systolic function was moderately reduced. The estimated ejection fraction was in the range of 35% to 40%. - Aortic valve: There was mild regurgitation. - Mitral valve: MR is eccentric, directed along posterior wall of left atrium. Calcified annulus. Moderately thickened leaflets . There was mild regurgitation. - Left atrium: The atrium was severely dilated. - Pulmonary arteries: PA peak pressure: 56 mm Hg  (S). Review of the above records demonstrates: As above   ASSESSMENT AND PLAN:  Chronic systolic heart failure/dilated cardiomyopathy  - Recent ER visit on 08/15/15 secondary to fluid overload with successful diuresis of 700 mL. Felt much better after this. He acknowledged that he was drinking a gallon of fluids a day. Fluid restriction once again discussed. 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 Explained how I was informed with telephone messages and discussed with my care team, Kelli Churn who relayed information back to her. She also pointed finger at me and states that I told him to drink a gallon of fluid a day. Discussed that with reduced ejection fraction, it would be wise for him to restrict his fluids once again. 1.5-2 L a day.  -nonischemic cardiomyopathy  - LVEDP was 23 during cardiac catheterization  - having intolerance to Toprol, carvedilol. I stopped low-dose beta blocker at prior visit, daughter  questions whether or not he is actually taking. He may have picked it up she states.. Dry weight currently 174 pounds.  - Last creatinine 1.56 from 1.29- checking basic metabolic profile today. I will check again in one week.  - He does not show any overt signs of fluid overload currently. Continue with Lasix   - Losartan, angiotensin receptor blocker, started by Dr. Felipa Eth on 01/22/15. He is doing very well with this medication.   - Continue with potassium supplementation.  - Fluid restriction, salt restriction.  - Main symptom was orthopnea. Mild cough. This has improved. Still has mild wheeze in right lower lung base.  - Blood pressure was quite low, unable to increase his beta. Daughter checks at home. Continue angiotensin receptor blocker.    -explained that ultimately we would love for him to be on a beta blocker as well as angiotensin receptor blocker but some patients aren't able to  tolerate the medications.   Secondary pulmonary hypertension  - Moderate, a result of left ventricular systolic dysfunction.  - Treat left-sided heart disease.  Mild to Moderate aortic regurgitation  - We will monitor clinically.   Current medicines are reviewed at length with the patient today.  The patient does not have concerns regarding medicines.  The following changes have been made:  no change  Labs/ tests ordered today include:   Orders Placed This Encounter  Procedures  . Basic Metabolic Panel (BMET)     Disposition:   FU with Rozalynn Buege in 1 week,     Signed, [name], MD  08/20/2015 11:08 AM    [facility] [address], [place], [place]  [address] Phone: [phone]; Fax: [phone]

## 2015-08-20 NOTE — Patient Instructions (Addendum)
Medication Instructions:  Please take one extra Furosemide 40 mg for 2 days. Continue all other medications as listed.  Labwork: Please have blood work today (BMP)  Restrict your fluid intake to no more than 2,000 ml or 2 liters a day.  Follow-Up: Please follow up in 1 week with blood work that day (BMP).  If you need a refill on your cardiac medications before your next appointment, please call your pharmacy.  Thank you for choosing [facility]!!

## 2015-08-22 ENCOUNTER — Telehealth: Payer: Self-pay | Admitting: Cardiology

## 2015-08-22 NOTE — Telephone Encounter (Signed)
Informed pt of lab results. Pt verbalized understanding. 

## 2015-08-22 NOTE — Telephone Encounter (Signed)
F/u  Pt returning RN phone call. Please call back and discuss.   

## 2015-08-26 ENCOUNTER — Other Ambulatory Visit: Payer: Self-pay | Admitting: Cardiology

## 2015-08-28 ENCOUNTER — Ambulatory Visit: Payer: Medicare Other | Admitting: Cardiology

## 2015-08-28 NOTE — Telephone Encounter (Signed)
Pt is to take (2) tablets every AM

## 2015-08-29 ENCOUNTER — Encounter: Payer: Self-pay | Admitting: Cardiology

## 2015-08-29 ENCOUNTER — Ambulatory Visit (INDEPENDENT_AMBULATORY_CARE_PROVIDER_SITE_OTHER): Payer: Medicare Other | Admitting: Cardiology

## 2015-08-29 VITALS — BP 124/68 | HR 58 | Ht 71.0 in | Wt 176.2 lb

## 2015-08-29 DIAGNOSIS — I272 Other secondary pulmonary hypertension: Secondary | ICD-10-CM

## 2015-08-29 DIAGNOSIS — IMO0002 Reserved for concepts with insufficient information to code with codable children: Secondary | ICD-10-CM

## 2015-08-29 DIAGNOSIS — Z79899 Other long term (current) drug therapy: Secondary | ICD-10-CM | POA: Diagnosis not present

## 2015-08-29 DIAGNOSIS — I428 Other cardiomyopathies: Secondary | ICD-10-CM

## 2015-08-29 DIAGNOSIS — I5022 Chronic systolic (congestive) heart failure: Secondary | ICD-10-CM | POA: Diagnosis not present

## 2015-08-29 DIAGNOSIS — I10 Essential (primary) hypertension: Secondary | ICD-10-CM

## 2015-08-29 DIAGNOSIS — I429 Cardiomyopathy, unspecified: Secondary | ICD-10-CM

## 2015-08-29 NOTE — Patient Instructions (Signed)
Medication Instructions:  The current medical regimen is effective;  continue present plan and medications.  Follow-Up: Follow up in 2 months with Dr Skains.  If you need a refill on your cardiac medications before your next appointment, please call your pharmacy.  Thank you for choosing [facility]!!       

## 2015-08-29 NOTE — Progress Notes (Signed)
Cardiology Office Note   Date:  08/29/2015   ID:  Carlos Gibson, DOB 1940/09/01, MRN [id number]  PCP:  [name], MD  Cardiologist:   [name], MD       History of Present Illness: Carlos Gibson is a 75 y.o. male who presents for nonischemic cardiomyopathy/ systolic heart failure. Had cardiac catheterization which showed normal coronary arteries, nonischemic cardiomyopathy, ejection fraction in the 25% range. His left ventricular end-diastolic pressure was also elevated in the mid 20s. Pulmonary pressures were moderately elevated.  On original presentation, his ejection fraction was found to be 40% in July 2016. 8 pounds fluid gain was noted. BNP was 1410. He had improvements after Lasix ministration according to Dr. Felipa Eth previously. Lisinopril was tried but ACE inhibitor cough was produced.  Back in 2014 his ejection fraction was low normal at 50-55%, aortic root was 4 cm. Has a history of PVCs. Previously worked at [employer].  History shortness of breath has improved after 2 days of Lasix 80 mg twice a day. His weight is stable. No chest pain, no syncope. Occasional dizziness. He has not been able to tolerate beta blocker.     Past Medical History  Diagnosis Date  . Bifascicular block   . Right BBB/left post fasc block   . Diabetes ([place])   . Hyperlipidemia   . Frequent PVCs   . Ejection fraction < 50%   . Aortic valve calcification   . Aortic valve regurgitation   . Aortic root dilation ([facility])   . Mild diastolic dysfunction   . Gilbert's syndrome   . Thrombocytopenia ([place])   . Cardiomyopathy [facility])     Past Surgical History  Procedure Laterality Date  . Cardiac catheterization N/A 12/20/2014    Procedure: Right/Left Heart Cath and Coronary Angiography;  Surgeon: Jerline Pain, MD;  Location: [facility];  Service: Cardiovascular;  Laterality: N/A;     Current Outpatient Prescriptions  Medication Sig Dispense Refill  . alfuzosin (UROXATRAL)  10 MG 24 hr tablet Take 10 mg by mouth daily.  3  . aspirin EC 81 MG tablet Take 81 mg by mouth daily.    [name] atorvastatin (LIPITOR) 20 MG tablet Take 20 mg by mouth daily.  8  . calcium-vitamin D (OSCAL WITH D) 500-200 MG-UNIT per tablet Take 1 tablet by mouth daily.     . finasteride (PROSCAR) 5 MG tablet Take 5 mg by mouth daily.   2  . furosemide (LASIX) 40 MG tablet Take 2 tablets (80 mg total) by mouth daily. 60 tablet 3  . losartan (COZAAR) 25 MG tablet TAKE 1 TABLET(25 MG) BY MOUTH DAILY 30 tablet 11  . metFORMIN (GLUCOPHAGE) 500 MG tablet Take 500 mg by mouth 2 (two) times daily.   3  . potassium chloride SA (K-DUR,KLOR-CON) 20 MEQ tablet Take 1 tablet (20 mEq total) by mouth daily. 30 tablet 6   No current facility-administered medications for this visit.    Allergies:   Ace inhibitors; Coreg; and Toprol xl cough   Social History:  The patient  reports that he has never smoked. He does not have any smokeless tobacco history on file.   Family History:  The patient's family history includes Hypertension in his mother; Irregular heart beat in his father; Stroke in his mother; Unexplained death in his father.    ROS:  Please see the history of present illness.   Otherwise, review of systems are positive for none.   All  other systems are reviewed and negative.    PHYSICAL EXAM: VS:  BP 124/68 mmHg  Pulse 58  Ht 5\' 11"  (1.803 m)  Wt 176 lb 3.2 oz (79.924 kg)  BMI 24.59 kg/m2 , BMI Body mass index is 24.59 kg/(m^2). GEN: Well nourished, well developed, in no acute distress HEENT: normal Neck: no JVD, carotid bruits, or masses Cardiac: RRR; no murmurs, rubs, + S3 gallops, no edema  Respiratory:  clear to auscultation bilaterally, normal work of breathing, mild wheeze heard right lung base. GI: soft, nontender, nondistended, + BS MS: no deformity or atrophy Skin: warm and dry, no rash Neuro:  Strength and sensation are intact Psych: euthymic mood, full affect   EKG:  Not done  today  CATH: 12/20/14  There is severe left ventricular systolic dysfunction.  Ejection fraction between 25-30% with global hypokinesis  Moderately elevated pulmonary pressures-mean PA pressure 35 mmHg consistent with secondary pulmonary hypertension as a result of increased left ventricular end-diastolic pressure  No angiographically significant coronary artery disease present.  Nonischemic cardiomyopathy. Left ventricular ejection fraction 25-30%. Left ventricular end-diastolic pressure 24 mmHg. Cardiac output 3.7 L/m with cardiac index of 1.8.  We'll continue with aggressive medical management. Up titration of medications.    Recent Labs: 12/24/2014: TSH 2.64 08/15/2015: ALT 53; B Natriuretic Peptide 1885.7*; Hemoglobin 12.9*; Platelets 158 08/20/2015: BUN 15; Creat 1.43*; Potassium 3.8; Sodium 140    Lipid Panel No results found for: CHOL, TRIG, HDL, CHOLHDL, VLDL, LDLCALC, LDLDIRECT    Wt Readings from Last 3 Encounters:  08/29/15 176 lb 3.2 oz (79.924 kg)  08/20/15 170 lb (77.111 kg)  08/15/15 186 lb (84.369 kg)      Other studies Reviewed: Additional studies/ records that were reviewed today include: Lab work, notes, cath report  Echocardiogram 05/20/15: - Left ventricle: The cavity size was mildly dilated. Wall thickness was increased in a pattern of mild LVH. Systolic function was moderately reduced. The estimated ejection fraction was in the range of 35% to 40%. - Aortic valve: There was mild regurgitation. - Mitral valve: MR is eccentric, directed along posterior wall of left atrium. Calcified annulus. Moderately thickened leaflets . There was mild regurgitation. - Left atrium: The atrium was severely dilated. - Pulmonary arteries: PA peak pressure: 56 mm Hg (S). Review of the above records demonstrates: As above   ASSESSMENT AND PLAN:  Chronic systolic heart failure/dilated cardiomyopathy  - I gave him Lasix 80 mg twice a day for 2 days and now  he is back on 80 mg once a day. He is feeling better. I instructed to take an extra 80 mg of Lasix if he starts to feel short of breath.  - Recent ER visit on 08/15/15 secondary to fluid overload with successful diuresis of 700 mL. Felt much better after this. He acknowledged that he was drinking a gallon of fluids a day. Fluid restriction once again discussed.. Discussed that with reduced ejection fraction, it would be wise for him to restrict his fluids once again. 1.5-2 L a day.  -nonischemic cardiomyopathy  - LVEDP was 23 during cardiac catheterization  - having intolerance to Toprol, carvedilol. I stopped low-dose beta blocker at prior visit  - Dry weight currently 174 pounds.  - Last creatinine 1.56 from 1.29-  15/60/17-1.43  - He does not show any overt signs of fluid overload currently.   - Continue with potassium supplementation.  - Main symptom was orthopnea. Mild cough. This has improved.   - Blood pressure was quite  low, unable to increase his beta. Daughter checks at home. Continue angiotensin receptor blocker.    -explained that ultimately we would love for him to be on a beta blocker as well as angiotensin receptor blocker but some patients aren't able to tolerate the medications.   Secondary pulmonary hypertension  - Moderate, a result of left ventricular systolic dysfunction.  - Treat left-sided heart disease.  Mild to Moderate aortic regurgitation  - We will monitor clinically.   Current medicines are reviewed at length with the patient today.  The patient does not have concerns regarding medicines.  The following changes have been made:  no change  Labs/ tests ordered today include:   No orders of the defined types were placed in this encounter.     Disposition:   FU with Patrycja Mumpower in 1 week,     Signed, [name], MD  08/29/2015 11:03 AM    [facility] [place], [place], [place]  [address] Phone: [phone]; Fax: [phone]

## 2015-09-12 DIAGNOSIS — N341 Nonspecific urethritis: Secondary | ICD-10-CM | POA: Diagnosis not present

## 2015-10-28 DIAGNOSIS — M1310 Monoarthritis, not elsewhere classified, unspecified site: Secondary | ICD-10-CM | POA: Diagnosis not present

## 2015-11-05 ENCOUNTER — Ambulatory Visit: Payer: Medicare Other | Admitting: Cardiology

## 2015-11-26 DIAGNOSIS — E1165 Type 2 diabetes mellitus with hyperglycemia: Secondary | ICD-10-CM | POA: Diagnosis not present

## 2015-11-26 DIAGNOSIS — E119 Type 2 diabetes mellitus without complications: Secondary | ICD-10-CM | POA: Diagnosis not present

## 2015-11-26 DIAGNOSIS — Z79899 Other long term (current) drug therapy: Secondary | ICD-10-CM | POA: Diagnosis not present

## 2015-11-26 DIAGNOSIS — I5020 Unspecified systolic (congestive) heart failure: Secondary | ICD-10-CM | POA: Diagnosis not present

## 2015-11-26 DIAGNOSIS — Z7984 Long term (current) use of oral hypoglycemic drugs: Secondary | ICD-10-CM | POA: Diagnosis not present

## 2015-11-26 DIAGNOSIS — I10 Essential (primary) hypertension: Secondary | ICD-10-CM | POA: Diagnosis not present

## 2015-12-03 ENCOUNTER — Ambulatory Visit (INDEPENDENT_AMBULATORY_CARE_PROVIDER_SITE_OTHER): Payer: Medicare Other | Admitting: Cardiology

## 2015-12-03 ENCOUNTER — Encounter: Payer: Self-pay | Admitting: Cardiology

## 2015-12-03 VITALS — BP 126/78 | HR 88 | Ht 71.0 in | Wt 179.0 lb

## 2015-12-03 DIAGNOSIS — I429 Cardiomyopathy, unspecified: Secondary | ICD-10-CM | POA: Diagnosis not present

## 2015-12-03 DIAGNOSIS — Z79899 Other long term (current) drug therapy: Secondary | ICD-10-CM | POA: Diagnosis not present

## 2015-12-03 DIAGNOSIS — IMO0002 Reserved for concepts with insufficient information to code with codable children: Secondary | ICD-10-CM

## 2015-12-03 DIAGNOSIS — I272 Other secondary pulmonary hypertension: Secondary | ICD-10-CM

## 2015-12-03 DIAGNOSIS — I5022 Chronic systolic (congestive) heart failure: Secondary | ICD-10-CM

## 2015-12-03 DIAGNOSIS — I4510 Unspecified right bundle-branch block: Secondary | ICD-10-CM

## 2015-12-03 DIAGNOSIS — I428 Other cardiomyopathies: Secondary | ICD-10-CM

## 2015-12-03 DIAGNOSIS — I452 Bifascicular block: Secondary | ICD-10-CM

## 2015-12-03 DIAGNOSIS — I77810 Thoracic aortic ectasia: Secondary | ICD-10-CM

## 2015-12-03 MED ORDER — FUROSEMIDE 40 MG PO TABS: 40.0000 mg | ORAL_TABLET | Freq: Every day | ORAL | 3 refills | Status: DC

## 2015-12-03 NOTE — Progress Notes (Signed)
Cardiology Office Note   Date:  12/03/2015   ID:  Carlos Gibson, DOB 05/20/1940, MRN [id number]  PCP:  [name], MD  Cardiologist:   [name], MD       History of Present Illness: Carlos Gibson is a 75 y.o. male who is here for follow-up for nonischemic cardiomyopathy/ systolic heart failure. Had cardiac catheterization which showed normal coronary arteries, nonischemic cardiomyopathy, ejection fraction in the 25% range. His left ventricular end-diastolic pressure was also elevated in the mid 20s. Pulmonary pressures were moderately elevated.  On original presentation, his ejection fraction was found to be 40% in July 2016. 8 pounds fluid gain was noted. BNP was 1410. He had improvements after Lasix administration according to Dr. Felipa Eth previously. Lisinopril was tried but ACE inhibitor cough was produced.  Back in 2014 his ejection fraction was low normal at 50-55%, aortic root was 4 cm. Has a history of PVCs. Previously worked at [employer].  Today he comes in and says that he felt weak earlier in the day. He is now starting to feel better. Blood pressures 126/78. No syncope, no shortness of breath with stairs. His weight is 179 which is up from 170 back in May posthospitalization. Once again he is not complaining of any shortness of breath however. He did ask if he had to take the Lasix for the rest of his life. We discussed. He admits that he is taking 40 mg once a day and sometimes taking an extra 20. At last clinic visit we had him taking 80 mg once a day. Dr. Felipa Eth saw him last week and his electrolytes were normal.   Past Medical History:  Diagnosis Date  . Aortic root dilation (HCC)   . Aortic valve calcification   . Aortic valve regurgitation   . Bifascicular block   . Cardiomyopathy ([employer])   . Diabetes ([place])   . Ejection fraction < 50%   . Frequent PVCs   . Gilbert's syndrome   . Hyperlipidemia   . Mild diastolic dysfunction   . Right BBB/left  post fasc block   . Thrombocytopenia ([place])     Past Surgical History:  Procedure Laterality Date  . CARDIAC CATHETERIZATION N/A 12/20/2014   Procedure: Right/Left Heart Cath and Coronary Angiography;  Surgeon: Jerline Pain, MD;  Location: [facility];  Service: Cardiovascular;  Laterality: N/A;     Current Outpatient Prescriptions  Medication Sig Dispense Refill  . alfuzosin (UROXATRAL) 10 MG 24 hr tablet Take 10 mg by mouth daily.  3  . aspirin EC 81 MG tablet Take 81 mg by mouth daily.    [name] atorvastatin (LIPITOR) 20 MG tablet Take 20 mg by mouth daily.  8  . calcium-vitamin D (OSCAL WITH D) 500-200 MG-UNIT per tablet Take 1 tablet by mouth daily.     . finasteride (PROSCAR) 5 MG tablet Take 5 mg by mouth daily.   2  . furosemide (LASIX) 40 MG tablet Take 1 tablet (40 mg total) by mouth daily. And 20 mg as needed for edema/SOB 60 tablet 3  . losartan (COZAAR) 25 MG tablet TAKE 1 TABLET(25 MG) BY MOUTH DAILY 30 tablet 11  . metFORMIN (GLUCOPHAGE) 500 MG tablet Take 500 mg by mouth 2 (two) times daily.   3  . potassium chloride SA (K-DUR,KLOR-CON) 20 MEQ tablet Take 1 tablet (20 mEq total) by mouth daily. 30 tablet 6   No current facility-administered medications for this visit.  Allergies:   Ace inhibitors; Coreg [carvedilol]; and Toprol xl [metoprolol succinate er] cough   Social History:  The patient  reports that he has never smoked. He does not have any smokeless tobacco history on file.   Family History:  The patient's family history includes Hypertension in his mother; Irregular heart beat in his father; Stroke in his mother; Unexplained death in his father.    ROS:  Please see the history of present illness.   Otherwise, review of systems are positive for none.   All other systems are reviewed and negative.    PHYSICAL EXAM: VS:  BP 126/78   Pulse 88   Ht 5\' 11"  (1.803 m)   Wt 179 lb (81.2 kg)   BMI 24.97 kg/m  , BMI Body mass index is 24.97 kg/m. GEN:  Well nourished, well developed, in no acute distress  HEENT: normal  Neck: no JVD, carotid bruits, or masses Cardiac: RRR; no murmurs, rubs, + S3 gallops, no edema  Respiratory:  clear to auscultation bilaterally, normal work of breathing, mild wheeze heard right lung base. GI: soft, nontender, nondistended, + BS MS: no deformity or atrophy  Skin: warm and dry, no rash Neuro:  Strength and sensation are intact Psych: euthymic mood, full affect   EKG:  Not done today  CATH: 12/20/14  There is severe left ventricular systolic dysfunction.  Ejection fraction between 25-30% with global hypokinesis  Moderately elevated pulmonary pressures-mean PA pressure 35 mmHg consistent with secondary pulmonary hypertension as a result of increased left ventricular end-diastolic pressure  No angiographically significant coronary artery disease present.  Nonischemic cardiomyopathy. Left ventricular ejection fraction 25-30%. Left ventricular end-diastolic pressure 24 mmHg. Cardiac output 3.7 L/m with cardiac index of 1.8.  We'll continue with aggressive medical management. Up titration of medications.    Recent Labs: 12/24/2014: TSH 2.64 08/15/2015: ALT 53; B Natriuretic Peptide 1,885.7; Hemoglobin 12.9; Platelets 158 08/20/2015: BUN 15; Creat 1.43; Potassium 3.8; Sodium 140    Lipid Panel No results found for: CHOL, TRIG, HDL, CHOLHDL, VLDL, LDLCALC, LDLDIRECT    Wt Readings from Last 3 Encounters:  12/03/15 179 lb (81.2 kg)  08/29/15 176 lb 3.2 oz (79.9 kg)  08/20/15 170 lb (77.1 kg)      Other studies Reviewed: Additional studies/ records that were reviewed today include: Lab work, notes, cath report  Echocardiogram 05/20/15: - Left ventricle: The cavity size was mildly dilated. Wall thickness was increased in a pattern of mild LVH. Systolicfunction was moderately reduced. The estimated ejection fractionwas in the range of 35% to 40%. - Aortic valve: There was mild  regurgitation. - Mitral valve: MR is eccentric, directed along posterior wall ofleft atrium. Calcified annulus. Moderately thickened leaflets . There was mild regurgitation. - Left atrium: The atrium was severely dilated. - Pulmonary arteries: PA peak pressure: 56 mm Hg (S). Review of the above records demonstrates: As above   ASSESSMENT AND PLAN:  Chronic systolic heart failure/dilated cardiomyopathy  -Overall quite stable with no symptoms of shortness of breath. He has on his own cut back his Lasix to 40 mg once a day. He occasionally will take an extra 20. His weight is increased however he does not demonstrate any lower extremity edema once again. No increased abdominal girth.  - Recent ER visit on 08/15/15 secondary to fluid overload with successful diuresis of 700 mL. Felt much better after this. He acknowledged that he was drinking a gallon of fluids a day. Fluid restriction once again discussed.. Discussed that with  reduced ejection fraction, it would be wise for him to restrict his fluids once again. 1.5-2 L a day.  -nonischemic cardiomyopathy  - LVEDP was 23 during cardiac catheterization  - having intolerance to Toprol, carvedilol. I stopped low-dose beta blocker at prior visit  - Dry weight currently 174 pounds. He is up a little bit.  - Last creatinine 1.56 from 1.29-1.43  - He does not show any overt signs of fluid overload currently.   - His weakness is improving. Unsure what the cause may have been.  - Continue with potassium supplementation.  - Main symptom was orthopnea. Mild cough. This has improved.   - Blood pressure was quite low, unable to increase his beta. Daughter checks at home. Continue angiotensin receptor blocker.    -explained that ultimately we would love for him to be on a beta blocker as well as angiotensin receptor blocker but some patients aren't able to tolerate the medications.   Secondary pulmonary hypertension  - Moderate, a result of left  ventricular systolic dysfunction.  - Treat left-sided heart disease.  Mild to Moderate aortic regurgitation  - We will monitor clinically.   Current medicines are reviewed at length with the patient today.  The patient does not have concerns regarding medicines.  The following changes have been made:  no change  Labs/ tests ordered today include:   No orders of the defined types were placed in this encounter.    Disposition:   FU with Kathlene November in [place] in 6 months    Signed, [name], MD  12/03/2015 2:23 PM    [address], [place], [place]  [address] Phone: [phone]; Fax: [phone]

## 2015-12-03 NOTE — Patient Instructions (Signed)
Medication Instructions:  The current medical regimen is effective;  continue present plan and medications.  Follow-Up: Follow up in 3 months with Bonney Leitz, PA.   Follow up in 6 months with Dr. Marlou Porch.  You will receive a letter in the mail 2 months before you are due.  Please call us when you receive this letter to schedule your follow up appointment.  If you need a refill on your cardiac medications before your next appointment, please call your pharmacy.  Thank you for choosing [facility]!!

## 2015-12-10 DIAGNOSIS — R300 Dysuria: Secondary | ICD-10-CM | POA: Diagnosis not present

## 2015-12-10 DIAGNOSIS — Z202 Contact with and (suspected) exposure to infections with a predominantly sexual mode of transmission: Secondary | ICD-10-CM | POA: Diagnosis not present

## 2015-12-31 ENCOUNTER — Ambulatory Visit (INDEPENDENT_AMBULATORY_CARE_PROVIDER_SITE_OTHER): Payer: Self-pay | Admitting: Cardiology

## 2015-12-31 ENCOUNTER — Inpatient Hospital Stay (HOSPITAL_COMMUNITY)
Admission: AD | Admit: 2015-12-31 | Discharge: 2016-01-02 | DRG: 291 | Disposition: A | Discharge status: Home / Self Care | Payer: Medicare Other | Source: Ambulatory Visit | Attending: Cardiology | Admitting: Cardiology

## 2015-12-31 ENCOUNTER — Telehealth: Payer: Self-pay | Admitting: Cardiology

## 2015-12-31 ENCOUNTER — Encounter: Payer: Self-pay | Admitting: Cardiology

## 2015-12-31 VITALS — BP 130/72 | HR 76 | Ht 71.0 in | Wt 181.0 lb

## 2015-12-31 DIAGNOSIS — I77810 Thoracic aortic ectasia: Secondary | ICD-10-CM | POA: Diagnosis present

## 2015-12-31 DIAGNOSIS — I272 Other secondary pulmonary hypertension: Secondary | ICD-10-CM

## 2015-12-31 DIAGNOSIS — E785 Hyperlipidemia, unspecified: Secondary | ICD-10-CM | POA: Diagnosis present

## 2015-12-31 DIAGNOSIS — Z7984 Long term (current) use of oral hypoglycemic drugs: Secondary | ICD-10-CM | POA: Diagnosis not present

## 2015-12-31 DIAGNOSIS — Z79899 Other long term (current) drug therapy: Secondary | ICD-10-CM | POA: Diagnosis not present

## 2015-12-31 DIAGNOSIS — I452 Bifascicular block: Secondary | ICD-10-CM | POA: Diagnosis present

## 2015-12-31 DIAGNOSIS — I130 Hypertensive heart and chronic kidney disease with heart failure and stage 1 through stage 4 chronic kidney disease, or unspecified chronic kidney disease: Principal | ICD-10-CM | POA: Diagnosis present

## 2015-12-31 DIAGNOSIS — E876 Hypokalemia: Secondary | ICD-10-CM

## 2015-12-31 DIAGNOSIS — I420 Dilated cardiomyopathy: Secondary | ICD-10-CM | POA: Diagnosis present

## 2015-12-31 DIAGNOSIS — I10 Essential (primary) hypertension: Secondary | ICD-10-CM | POA: Diagnosis present

## 2015-12-31 DIAGNOSIS — I493 Ventricular premature depolarization: Secondary | ICD-10-CM | POA: Diagnosis present

## 2015-12-31 DIAGNOSIS — Z23 Encounter for immunization: Secondary | ICD-10-CM | POA: Diagnosis not present

## 2015-12-31 DIAGNOSIS — Z7982 Long term (current) use of aspirin: Secondary | ICD-10-CM | POA: Diagnosis not present

## 2015-12-31 DIAGNOSIS — R0602 Shortness of breath: Secondary | ICD-10-CM | POA: Diagnosis not present

## 2015-12-31 DIAGNOSIS — Z888 Allergy status to other drugs, medicaments and biological substances status: Secondary | ICD-10-CM | POA: Diagnosis not present

## 2015-12-31 DIAGNOSIS — Z823 Family history of stroke: Secondary | ICD-10-CM | POA: Diagnosis not present

## 2015-12-31 DIAGNOSIS — N183 Chronic kidney disease, stage 3 unspecified: Secondary | ICD-10-CM

## 2015-12-31 DIAGNOSIS — Z8249 Family history of ischemic heart disease and other diseases of the circulatory system: Secondary | ICD-10-CM | POA: Diagnosis not present

## 2015-12-31 DIAGNOSIS — I5023 Acute on chronic systolic (congestive) heart failure: Secondary | ICD-10-CM

## 2015-12-31 DIAGNOSIS — I351 Nonrheumatic aortic (valve) insufficiency: Secondary | ICD-10-CM | POA: Diagnosis present

## 2015-12-31 DIAGNOSIS — IMO0002 Reserved for concepts with insufficient information to code with codable children: Secondary | ICD-10-CM | POA: Diagnosis present

## 2015-12-31 DIAGNOSIS — E1122 Type 2 diabetes mellitus with diabetic chronic kidney disease: Secondary | ICD-10-CM | POA: Diagnosis present

## 2015-12-31 DIAGNOSIS — R0600 Dyspnea, unspecified: Secondary | ICD-10-CM

## 2015-12-31 HISTORY — DX: Pulmonary hypertension, unspecified: I27.20

## 2015-12-31 HISTORY — DX: Chronic kidney disease, stage 3 (moderate): N18.3

## 2015-12-31 HISTORY — DX: Chronic systolic (congestive) heart failure: I50.22

## 2015-12-31 HISTORY — DX: Other cardiomyopathies: I42.8

## 2015-12-31 LAB — COMPREHENSIVE METABOLIC PANEL
ALK PHOS: 47 U/L (ref 38–126)
ALT: 53 U/L (ref 17–63)
AST: 34 U/L (ref 15–41)
Albumin: 3.7 g/dL (ref 3.5–5.0)
Anion gap: 8 (ref 5–15)
BUN: 20 mg/dL (ref 6–20)
CHLORIDE: 107 mmol/L (ref 101–111)
CO2: 26 mmol/L (ref 22–32)
CREATININE: 1.64 mg/dL — AB (ref 0.61–1.24)
Calcium: 9.1 mg/dL (ref 8.9–10.3)
GFR, EST AFRICAN AMERICAN: 46 mL/min — AB (ref >60)
GFR, EST NON AFRICAN AMERICAN: 39 mL/min — AB (ref >60)
Glucose, Bld: 97 mg/dL (ref 65–99)
Potassium: 3.7 mmol/L (ref 3.5–5.1)
SODIUM: 141 mmol/L (ref 135–145)
Total Bilirubin: 2.3 mg/dL — ABNORMAL HIGH (ref 0.3–1.2)
Total Protein: 6 g/dL — ABNORMAL LOW (ref 6.5–8.1)

## 2015-12-31 LAB — CBC WITH DIFFERENTIAL/PLATELET
BASOS ABS: 0 10*3/uL (ref 0.0–0.1)
Basophils Relative: 0 %
Eosinophils Absolute: 0.1 10*3/uL (ref 0.0–0.7)
Eosinophils Relative: 2 %
HCT: 39.7 % (ref 39.0–52.0)
HEMOGLOBIN: 13.1 g/dL (ref 13.0–17.0)
LYMPHS ABS: 1.8 10*3/uL (ref 0.7–4.0)
LYMPHS PCT: 38 %
MCH: 29 pg (ref 26.0–34.0)
MCHC: 33 g/dL (ref 30.0–36.0)
MCV: 88 fL (ref 78.0–100.0)
Monocytes Absolute: 0.4 10*3/uL (ref 0.1–1.0)
Monocytes Relative: 9 %
NEUTROS PCT: 51 %
Neutro Abs: 2.3 10*3/uL (ref 1.7–7.7)
Platelets: 145 10*3/uL — ABNORMAL LOW (ref 150–400)
RBC: 4.51 MIL/uL (ref 4.22–5.81)
RDW: 13.8 % (ref 11.5–15.5)
WBC: 4.6 10*3/uL (ref 4.0–10.5)

## 2015-12-31 LAB — BRAIN NATRIURETIC PEPTIDE: B Natriuretic Peptide: 1865.4 pg/mL — ABNORMAL HIGH (ref 0.0–100.0)

## 2015-12-31 MED ORDER — METFORMIN HCL 500 MG PO TABS
500.0000 mg | ORAL_TABLET | Freq: Two times a day (BID) | ORAL | Status: DC
  Administered 2016-01-01: 500 mg via ORAL
  Filled 2015-12-31: qty 1

## 2015-12-31 MED ORDER — LOSARTAN POTASSIUM 25 MG PO TABS
25.0000 mg | ORAL_TABLET | Freq: Every day | ORAL | Status: DC
  Administered 2016-01-01 – 2016-01-02 (×2): 25 mg via ORAL
  Filled 2015-12-31 (×2): qty 1

## 2015-12-31 MED ORDER — INFLUENZA VAC SPLIT QUAD 0.5 ML IM SUSY
0.5000 mL | PREFILLED_SYRINGE | INTRAMUSCULAR | Status: AC
  Administered 2016-01-02: 0.5 mL via INTRAMUSCULAR
  Filled 2015-12-31: qty 0.5

## 2015-12-31 MED ORDER — ATORVASTATIN CALCIUM 20 MG PO TABS
20.0000 mg | ORAL_TABLET | Freq: Every day | ORAL | Status: DC
  Filled 2015-12-31 (×2): qty 1

## 2015-12-31 MED ORDER — FINASTERIDE 5 MG PO TABS
5.0000 mg | ORAL_TABLET | Freq: Every day | ORAL | Status: DC
  Administered 2016-01-01 – 2016-01-02 (×2): 5 mg via ORAL
  Filled 2015-12-31 (×2): qty 1

## 2015-12-31 MED ORDER — SODIUM CHLORIDE 0.9% FLUSH
3.0000 mL | INTRAVENOUS | Status: DC | PRN
  Administered 2016-01-01: 3 mL via INTRAVENOUS
  Filled 2015-12-31: qty 3

## 2015-12-31 MED ORDER — ACETAMINOPHEN 325 MG PO TABS: 650.0000 mg | ORAL_TABLET | ORAL | Status: DC | PRN

## 2015-12-31 MED ORDER — POTASSIUM CHLORIDE CRYS ER 20 MEQ PO TBCR
20.0000 meq | EXTENDED_RELEASE_TABLET | Freq: Every day | ORAL | Status: DC
  Administered 2016-01-01: 20 meq via ORAL
  Filled 2015-12-31: qty 1

## 2015-12-31 MED ORDER — HEPARIN SODIUM (PORCINE) 5000 UNIT/ML IJ SOLN
5000.0000 [IU] | Freq: Three times a day (TID) | INTRAMUSCULAR | Status: DC
Start: 2015-12-31 — End: 2016-01-02
  Administered 2015-12-31 – 2016-01-01 (×3): 5000 [IU] via SUBCUTANEOUS
  Filled 2015-12-31 (×4): qty 1

## 2015-12-31 MED ORDER — ALFUZOSIN HCL ER 10 MG PO TB24
10.0000 mg | ORAL_TABLET | Freq: Every day | ORAL | Status: DC
  Administered 2016-01-01 – 2016-01-02 (×2): 10 mg via ORAL
  Filled 2015-12-31 (×3): qty 1

## 2015-12-31 MED ORDER — FUROSEMIDE 10 MG/ML IJ SOLN
80.0000 mg | Freq: Two times a day (BID) | INTRAMUSCULAR | Status: DC
  Administered 2015-12-31 – 2016-01-01 (×2): 80 mg via INTRAVENOUS
  Filled 2015-12-31 (×2): qty 8

## 2015-12-31 MED ORDER — SODIUM CHLORIDE 0.9% FLUSH
3.0000 mL | Freq: Two times a day (BID) | INTRAVENOUS | Status: DC
  Administered 2015-12-31 – 2016-01-02 (×4): 3 mL via INTRAVENOUS

## 2015-12-31 MED ORDER — ONDANSETRON HCL 4 MG/2ML IJ SOLN: 4.0000 mg | Freq: Four times a day (QID) | INTRAMUSCULAR | Status: DC | PRN

## 2015-12-31 MED ORDER — ASPIRIN EC 81 MG PO TBEC
81.0000 mg | DELAYED_RELEASE_TABLET | Freq: Every day | ORAL | Status: DC
  Administered 2016-01-01 – 2016-01-02 (×2): 81 mg via ORAL
  Filled 2015-12-31 (×2): qty 1

## 2015-12-31 MED ORDER — SODIUM CHLORIDE 0.9 % IV SOLN: 250.0000 mL | INTRAVENOUS | Status: DC | PRN

## 2015-12-31 NOTE — Telephone Encounter (Signed)
Spoke with patient who is reporting he has been having increased SOB and edema for the past several weeks.  If he walks 5 or 6 steps he feels like he's "give out".  He reports edema almost up to his knees bilaterally. Describes pitting edema.  Wt at 176 lbs, HR - pt doesn't know and BP 128/78.  He reports he has been taking a total of 60 mg a day for several weeks now.  Added on the schedule today to eval for CHF.  Pt in agreement.  Dr Marlou Porch is aware.

## 2015-12-31 NOTE — Telephone Encounter (Signed)
New message     Pt c/o Shortness Of Breath: STAT if SOB developed within the last 24 hours or pt is noticeably SOB on the phone  1. Are you currently SOB (can you hear that pt is SOB on the phone)? No   2. How long have you been experiencing SOB? 2 weeks now   3. Are you SOB when sitting or when up moving around? Both   4. Are you currently experiencing any other symptoms? Legs swelling - nausea .

## 2015-12-31 NOTE — Progress Notes (Signed)
Cardiology history and physical   Date:  12/31/2015   ID:  Carlos Gibson, DOB Sep 13, 1940, MRN [id number]  PCP:  Carlos Argyle, MD  Cardiologist:   Carlos Furbish, MD    Chief complaint: Acute systolic heart failure-shortness of breath with minimal activity   History of Present Illness: Carlos Gibson is a 75 y.o. male who is here for increased shortness of breath over the past 2-3 weeks, worsening orthopnea, class III-like symptoms of his nonischemic cardiomyopathy/ systolic heart failure. He was just seen last month and was doing quite well. He does state however that over the past 2-3 weeks he has been increasing shortness of breath, dyspnea on exertion, orthopnea. Weight has increased to 181 from 174 dry weight.  Had cardiac catheterization which showed normal coronary arteries, nonischemic cardiomyopathy, ejection fraction in the 25% range. His left ventricular end-diastolic pressure was also elevated in the mid 20s. Pulmonary pressures were moderately elevated.  On original presentation, his ejection fraction was found to be 40% in July 2016. 8 pounds fluid gain was noted. BNP was 1410. He had improvements after Lasix administration according to Dr. Felipa Eth previously. Lisinopril was tried but ACE inhibitor cough was produced.  Back in 2014 his ejection fraction was low normal at 50-55%, aortic root was 4 cm. Has a history of PVCs. Previously worked at [employer].  He was taking 60 mg of Lasix daily which is an increase from his 40 mg without any significant relief. He denies eating excessive salt. He also denies any excessive fluid intake.  No chest Gibson, no syncope, no bleeding. He is having some worsening orthopnea.  Past Medical History:  Diagnosis Date  . Aortic root dilation (HCC)   . Aortic valve calcification   . Aortic valve regurgitation   . Bifascicular block   . Cardiomyopathy ([place])   . Diabetes ([place])   . Ejection fraction < 50%   . Frequent PVCs   .  Gilbert's syndrome   . Hyperlipidemia   . Mild diastolic dysfunction   . Right BBB/left post fasc block   . Thrombocytopenia ([place])     Past Surgical History:  Procedure Laterality Date  . CARDIAC CATHETERIZATION N/A 12/20/2014   Procedure: Right/Left Heart Cath and Coronary Angiography;  Surgeon: Carlos Pain, MD;  Location: [facility];  Service: Cardiovascular;  Laterality: N/A;     No current facility-administered medications for this visit.    No current outpatient prescriptions on file.   Facility-Administered Medications Ordered in Other Visits  Medication Dose Route Frequency Provider Last Rate Last Dose  . 0.9 %  sodium chloride infusion  250 mL Intravenous PRN Carlos Pain, MD      . acetaminophen (TYLENOL) tablet 650 mg  650 mg Oral Q4H PRN Carlos Pain, MD      . alfuzosin (UROXATRAL) 24 hr tablet 10 mg  10 mg Oral Daily Carlos Pain, MD      . aspirin EC tablet 81 mg  81 mg Oral Daily Carlos Pain, MD      . atorvastatin (LIPITOR) tablet 20 mg  20 mg Oral Daily Carlos Pain, MD      . finasteride (PROSCAR) tablet 5 mg  5 mg Oral Daily Carlos Pain, MD      . furosemide (LASIX) injection 80 mg  80 mg Intravenous BID Carlos Pain, MD      . heparin injection 5,000 Units  5,000 Units Subcutaneous Q8H Carlos Gibson C  Carlos Porch, MD      . Derrill Memo ON 01/01/2016] Influenza vac split quadrivalent PF (FLUARIX) injection 0.5 mL  0.5 mL Intramuscular Tomorrow-1000 Carlos Pain, MD      . losartan (COZAAR) tablet 25 mg  25 mg Oral Daily Carlos Pain, MD      . metFORMIN (GLUCOPHAGE) tablet 500 mg  500 mg Oral BID Carlos Pain, MD      . ondansetron [facility]) injection 4 mg  4 mg Intravenous Q6H PRN Carlos Pain, MD      . potassium chloride SA (K-DUR,KLOR-CON) CR tablet 20 mEq  20 mEq Oral Daily Carlos Pain, MD      . sodium chloride flush (NS) 0.9 % injection 3 mL  3 mL Intravenous Q12H Carlos Pain, MD      . sodium chloride flush (NS) 0.9 % injection 3 mL  3 mL Intravenous  PRN Carlos Pain, MD        Allergies:   Ace inhibitors; Coreg [carvedilol]; and Toprol xl [metoprolol succinate er] cough   Social History:  The patient  reports that he has never smoked. He does not have any smokeless tobacco history on file.   Family History:  The patient's family history includes Hypertension in his mother; Irregular heart beat in his father; Stroke in his mother; Unexplained death in his father.    ROS:  Please see the history of present illness.   Otherwise, review of systems are positive for none.   All other systems are reviewed and negative.    PHYSICAL EXAM: VS:  BP 130/72   Pulse 76   Ht 5\' 11"  (1.803 m)   Wt 181 lb (82.1 kg)   BMI 25.24 kg/m  , BMI Body mass index is 25.24 kg/m. GEN: Well nourished, well developed, in no acute distress  HEENT: normal  Neck: no JVD, carotid bruits, or masses Cardiac: RRR; no murmurs, rubs, + S3 gallops, no edema  Respiratory:  clear to auscultation bilaterally, normal work of breathing, mild wheeze heard right lung base. GI: soft, nontender, nondistended, + BS MS: no deformity or atrophy  Skin: warm and dry, no rash Neuro:  Strength and sensation are intact Psych: euthymic mood, full affect   EKG:  EKG ordered today 12/31/15-sinus rhythm with first-degree AV block PR interval 220 ms, right bundle branch block heart rate 90 with left anterior fascicular block, bifascicular block.  CATH: 12/20/14  There is severe left ventricular systolic dysfunction.  Ejection fraction between 25-30% with global hypokinesis  Moderately elevated pulmonary pressures-mean PA pressure 35 mmHg consistent with secondary pulmonary hypertension as a result of increased left ventricular end-diastolic pressure  No angiographically significant coronary artery disease present.  Nonischemic cardiomyopathy. Left ventricular ejection fraction 25-30%. Left ventricular end-diastolic pressure 24 mmHg. Cardiac output 3.7 L/m with cardiac index of  1.8.   Recent Labs: 08/15/2015: ALT 53; B Natriuretic Peptide 1,885.7; Hemoglobin 12.9; Platelets 158 08/20/2015: BUN 15; Creat 1.43; Potassium 3.8; Sodium 140    Lipid Panel No results found for: CHOL, TRIG, HDL, CHOLHDL, VLDL, LDLCALC, LDLDIRECT    Wt Readings from Last 3 Encounters:  12/31/15 175 lb 11.3 oz (79.7 kg)  12/31/15 181 lb (82.1 kg)  12/03/15 179 lb (81.2 kg)      Other studies Reviewed: Additional studies/ records that were reviewed today include: Lab work, notes, cath report  Echocardiogram 05/20/15: - Left ventricle: The cavity size was mildly dilated. Wall thickness was increased in a pattern of  mild LVH. Systolicfunction was moderately reduced. The estimated ejection fractionwas in the range of 35% to 40%. - Aortic valve: There was mild regurgitation. - Mitral valve: MR is eccentric, directed along posterior wall ofleft atrium. Calcified annulus. Moderately thickened leaflets . There was mild regurgitation. - Left atrium: The atrium was severely dilated. - Pulmonary arteries: PA peak pressure: 56 mm Hg (S). Review of the above records demonstrates: As above   ASSESSMENT AND PLAN:  Acute on chronic systolic heart failure/dilated cardiomyopathy   - We will admit to 3 E.   - Place on IV Lasix 80 mg IV twice a day  - Check basic metabolic profile  - Check chest x-ray  - Check BNP, CBC  - Check echocardiogram. Make sure there has been no significant change in ejection fraction/aortic valve disorder.  - Recent ER visit on 08/15/15 secondary to fluid overload with successful diuresis of 700 mL. Felt much better after this. He acknowledged that he was drinking a gallon of fluids a day. Fluid restriction once again discussed.. Discussed that with reduced ejection fraction, it would be wise for him to restrict his fluids once again. 1.5-2 L a day.  -nonischemic cardiomyopathy  - LVEDP was 23 during cardiac catheterization  - having intolerance to Toprol,  carvedilol. I stopped low-dose beta blocker at prior visit  - Dry weight currently 174 pounds. Now 181 pounds  - Last creatinine 1.56 from 1.29-1.43  - Daughter checks at home. Continue angiotensin receptor blocker.    -explained that ultimately we would love for him to be on a beta blocker as well as angiotensin receptor blocker but some patients aren't able to tolerate the medications.   Secondary pulmonary hypertension  - Moderate, a result of left ventricular systolic dysfunction.  - Treat left-sided heart disease.  Mild to Moderate aortic regurgitation  - We will monitor clinically.  Right bundle branch block, first agree AV block, left anterior fascicular block  - Stable on EKG.  Signed, Carlos Furbish, MD  12/31/2015 4:51 PM    [facility] [place], [place], [place]  [address] Phone: [phone]; Fax: [phone]

## 2015-12-31 NOTE — H&P (Signed)
Cardiology history and physical   Date:  12/31/2015   ID:  Carlos Gibson, DOB Jul 13, 1940, MRN [id number]  PCP:  Carlos Argyle, MD        Cardiologist:   Carlos Furbish, MD    Chief complaint: Acute systolic heart failure-shortness of breath with minimal activity   History of Present Illness: Carlos Gibson is a 75 y.o. male who is here for increased shortness of breath over the past 2-3 weeks, worsening orthopnea, class III-like symptoms of his nonischemic cardiomyopathy/ systolic heart failure. He was just seen last month and was doing quite well. He does state however that over the past 2-3 weeks he has been increasing shortness of breath, dyspnea on exertion, orthopnea. Weight has increased to 181 from 174 dry weight.  Had cardiac catheterization which showed normal coronary arteries, nonischemic cardiomyopathy, ejection fraction in the 25% range. His left ventricular end-diastolic pressure was also elevated in the mid 20s. Pulmonary pressures were moderately elevated.  On original presentation, his ejection fraction was found to be 40% in July 2016. 8 pounds fluid gain was noted. BNP was 1410. He had improvements after Lasix administration according to Dr. Felipa Eth previously. Lisinopril was tried but ACE inhibitor cough was produced.  Back in 2014 his ejection fraction was low normal at 50-55%, aortic root was 4 cm. Has a history of PVCs. Previously worked at [employer].  He was taking 60 mg of Lasix daily which is an increase from his 40 mg without any significant relief. He denies eating excessive salt. He also denies any excessive fluid intake.  No chest pain, no syncope, no bleeding. He is having some worsening orthopnea.      Past Medical History:  Diagnosis Date  . Aortic root dilation (HCC)   . Aortic valve calcification   . Aortic valve regurgitation   . Bifascicular block   . Cardiomyopathy ([place])   . Diabetes ([place])   . Ejection fraction < 50%     . Frequent PVCs   . Gilbert's syndrome   . Hyperlipidemia   . Mild diastolic dysfunction   . Right BBB/left post fasc block   . Thrombocytopenia ([place])     Past Surgical History:  Procedure Laterality Date  . CARDIAC CATHETERIZATION N/A 12/20/2014   Procedure: Right/Left Heart Cath and Coronary Angiography;  Surgeon: Jerline Pain, MD;  Location: [facility];  Service: Cardiovascular;  Laterality: N/A;           Current Outpatient Prescriptions  Medication Sig Dispense Refill  . alfuzosin (UROXATRAL) 10 MG 24 hr tablet Take 10 mg by mouth daily.  3  . aspirin EC 81 MG tablet Take 81 mg by mouth daily.    [name] atorvastatin (LIPITOR) 20 MG tablet Take 20 mg by mouth daily.  8  . calcium-vitamin D (OSCAL WITH D) 500-200 MG-UNIT per tablet Take 1 tablet by mouth daily.     . finasteride (PROSCAR) 5 MG tablet Take 5 mg by mouth daily.   2  . furosemide (LASIX) 20 MG tablet Take 20 mg by mouth daily.    . furosemide (LASIX) 40 MG tablet Take 40 mg by mouth daily.    [name] losartan (COZAAR) 25 MG tablet TAKE 1 TABLET(25 MG) BY MOUTH DAILY 30 tablet 11  . metFORMIN (GLUCOPHAGE) 500 MG tablet Take 500 mg by mouth 2 (two) times daily.   3  . potassium chloride SA (K-DUR,KLOR-CON) 20 MEQ tablet Take 1 tablet (20 mEq total) by mouth  daily. 30 tablet 6   No current facility-administered medications for this visit.     Allergies:   Ace inhibitors; Coreg [carvedilol]; and Toprol xl [metoprolol succinate er] cough   Social History:  The patient  reports that he has never smoked. He does not have any smokeless tobacco history on file.   Family History:  The patient's family history includes Hypertension in his mother; Irregular heart beat in his father; Stroke in his mother; Unexplained death in his father.    ROS:  Please see the history of present illness.   Otherwise, review of systems are positive for none.   All other systems are reviewed and negative.     PHYSICAL EXAM: VS:  BP 130/72   Pulse 76   Ht 5\' 11"  (1.803 m)   Wt 181 lb (82.1 kg)   BMI 25.24 kg/m  , BMI Body mass index is 25.24 kg/m. GEN: Well nourished, well developed, in no acute distress  HEENT: normal  Neck: no JVD, carotid bruits, or masses Cardiac: RRR; no murmurs, rubs, + S3 gallops, no edema  Respiratory:  clear to auscultation bilaterally, normal work of breathing, mild wheeze heard right lung base. GI: soft, nontender, nondistended, + BS MS: no deformity or atrophy  Skin: warm and dry, no rash Neuro:  Strength and sensation are intact Psych: euthymic mood, full affect   EKG:  EKG ordered today 12/31/15-sinus rhythm with first-degree AV block PR interval 220 ms, right bundle branch block heart rate 90 with left anterior fascicular block, bifascicular block.  CATH: 12/20/14  There is severe left ventricular systolic dysfunction.  Ejection fraction between 25-30% with global hypokinesis  Moderately elevated pulmonary pressures-mean PA pressure 35 mmHg consistent with secondary pulmonary hypertension as a result of increased left ventricular end-diastolic pressure  No angiographically significant coronary artery disease present.  Nonischemic cardiomyopathy. Left ventricular ejection fraction 25-30%. Left ventricular end-diastolic pressure 24 mmHg. Cardiac output 3.7 L/m with cardiac index of 1.8.   Recent Labs: 08/15/2015: ALT 53; B Natriuretic Peptide 1,885.7; Hemoglobin 12.9; Platelets 158 08/20/2015: BUN 15; Creat 1.43; Potassium 3.8; Sodium 140    Lipid Panel Labs(Brief)  No results found for: CHOL, TRIG, HDL, CHOLHDL, VLDL, LDLCALC, LDLDIRECT         Wt Readings from Last 3 Encounters:  12/31/15 181 lb (82.1 kg)  12/03/15 179 lb (81.2 kg)  08/29/15 176 lb 3.2 oz (79.9 kg)      Other studies Reviewed: Additional studies/ records that were reviewed today include: Lab work, notes, cath report  Echocardiogram 05/20/15: - Left  ventricle: The cavity size was mildly dilated. Wall thickness was increased in a pattern of mild LVH. Systolicfunction was moderately reduced. The estimated ejection fractionwas in the range of 35% to 40%. - Aortic valve: There was mild regurgitation. - Mitral valve: MR is eccentric, directed along posterior wall ofleft atrium. Calcified annulus. Moderately thickened leaflets . There was mild regurgitation. - Left atrium: The atrium was severely dilated. - Pulmonary arteries: PA peak pressure: 56 mm Hg (S). Review of the above records demonstrates: As above   ASSESSMENT AND PLAN:  Acute on chronic systolic heart failure/dilated cardiomyopathy   - We will admit to 3 E.   - Place on IV Lasix 80 mg IV twice a day  - Check basic metabolic profile  - Check chest x-ray  - Check BNP, CBC  - Check echocardiogram. Make sure there has been no significant change in ejection fraction/aortic valve disorder.  - Recent ER  visit on 08/15/15 secondary to fluid overload with successful diuresis of 700 mL. Felt much better after this. He acknowledged that he was drinking a gallon of fluids a day. Fluid restriction once again discussed.. Discussed that with reduced ejection fraction, it would be wise for him to restrict his fluids once again. 1.5-2 L a day.  -nonischemic cardiomyopathy  - LVEDP was 23 during cardiac catheterization  - having intolerance to Toprol, carvedilol. I stopped low-dose beta blocker at prior visit  - Dry weight currently 174 pounds. Now 181 pounds  - Last creatinine 1.56 from 1.29-1.43  - Daughter checks at home. Continue angiotensin receptor blocker.    -explained that ultimately we would love for him to be on a beta blocker as well as angiotensin receptor blocker but some patients aren't able to tolerate the medications.   Secondary pulmonary hypertension  - Moderate, a result of left ventricular systolic dysfunction.  - Treat left-sided heart disease.  Mild to  Moderate aortic regurgitation  - We will monitor clinically.  Right bundle branch block, first agree AV block, left anterior fascicular block  - Stable on EKG.  Signed, Carlos Furbish, MD  12/31/2015 2:23 PM    [facility] [address], [place], [place]  [address] Phone: [phone]; Fax: [phone]

## 2016-01-01 ENCOUNTER — Encounter (HOSPITAL_COMMUNITY): Payer: Self-pay | Admitting: Physician Assistant

## 2016-01-01 ENCOUNTER — Inpatient Hospital Stay (HOSPITAL_COMMUNITY): Payer: Medicare Other

## 2016-01-01 DIAGNOSIS — E876 Hypokalemia: Secondary | ICD-10-CM

## 2016-01-01 DIAGNOSIS — N183 Chronic kidney disease, stage 3 unspecified: Secondary | ICD-10-CM

## 2016-01-01 DIAGNOSIS — E785 Hyperlipidemia, unspecified: Secondary | ICD-10-CM

## 2016-01-01 DIAGNOSIS — I10 Essential (primary) hypertension: Secondary | ICD-10-CM

## 2016-01-01 DIAGNOSIS — I452 Bifascicular block: Secondary | ICD-10-CM

## 2016-01-01 DIAGNOSIS — I493 Ventricular premature depolarization: Secondary | ICD-10-CM

## 2016-01-01 HISTORY — DX: Chronic kidney disease, stage 3 unspecified: N18.30

## 2016-01-01 LAB — GLUCOSE, CAPILLARY
Glucose-Capillary: 188 mg/dL — ABNORMAL HIGH (ref 65–99)
Glucose-Capillary: 116 mg/dL — ABNORMAL HIGH (ref 65–99)
Glucose-Capillary: 166 mg/dL — ABNORMAL HIGH (ref 65–99)

## 2016-01-01 LAB — BASIC METABOLIC PANEL
ANION GAP: 8 (ref 5–15)
BUN: 19 mg/dL (ref 6–20)
CALCIUM: 8.8 mg/dL — AB (ref 8.9–10.3)
CO2: 27 mmol/L (ref 22–32)
CREATININE: 1.62 mg/dL — AB (ref 0.61–1.24)
Chloride: 106 mmol/L (ref 101–111)
GFR, EST AFRICAN AMERICAN: 46 mL/min — AB (ref >60)
GFR, EST NON AFRICAN AMERICAN: 40 mL/min — AB (ref >60)
Glucose, Bld: 138 mg/dL — ABNORMAL HIGH (ref 65–99)
Potassium: 3.1 mmol/L — ABNORMAL LOW (ref 3.5–5.1)
SODIUM: 141 mmol/L (ref 135–145)

## 2016-01-01 LAB — RAPID URINE DRUG SCREEN, HOSP PERFORMED
Amphetamines: NOT DETECTED
BARBITURATES: NOT DETECTED
BENZODIAZEPINES: NOT DETECTED
COCAINE: NOT DETECTED
OPIATES: NOT DETECTED
Tetrahydrocannabinol: NOT DETECTED

## 2016-01-01 MED ORDER — INSULIN ASPART 100 UNIT/ML ~~LOC~~ SOLN: 0.0000 [IU] | Freq: Three times a day (TID) | SUBCUTANEOUS | Status: DC

## 2016-01-01 MED ORDER — INSULIN ASPART 100 UNIT/ML ~~LOC~~ SOLN
0.0000 [IU] | Freq: Three times a day (TID) | SUBCUTANEOUS | Status: DC
  Administered 2016-01-01: 2 [IU] via SUBCUTANEOUS
  Administered 2016-01-02: 3 [IU] via SUBCUTANEOUS
  Administered 2016-01-02: 1 [IU] via SUBCUTANEOUS

## 2016-01-01 MED ORDER — FUROSEMIDE 80 MG PO TABS
80.0000 mg | ORAL_TABLET | Freq: Every day | ORAL | Status: DC
  Administered 2016-01-02: 80 mg via ORAL
  Filled 2016-01-01: qty 1

## 2016-01-01 MED ORDER — POTASSIUM CHLORIDE CRYS ER 20 MEQ PO TBCR
40.0000 meq | EXTENDED_RELEASE_TABLET | Freq: Once | ORAL | Status: AC
  Administered 2016-01-01: 40 meq via ORAL
  Filled 2016-01-01: qty 2

## 2016-01-01 MED ORDER — POTASSIUM CHLORIDE CRYS ER 20 MEQ PO TBCR
40.0000 meq | EXTENDED_RELEASE_TABLET | Freq: Two times a day (BID) | ORAL | Status: DC
  Administered 2016-01-01 – 2016-01-02 (×2): 40 meq via ORAL
  Filled 2016-01-01 (×2): qty 2

## 2016-01-01 MED ORDER — FUROSEMIDE 10 MG/ML IJ SOLN
80.0000 mg | Freq: Two times a day (BID) | INTRAMUSCULAR | Status: AC
  Administered 2016-01-01: 80 mg via INTRAVENOUS
  Filled 2016-01-01: qty 8

## 2016-01-01 NOTE — Progress Notes (Signed)
Mr. Carlos Gibson is having a unremarkable day. He has no complaints of pain, discomfort or shortness of breath today. He states he feels much better today. Plan of care and changes to medications discussed with him. He has no questions at this time.

## 2016-01-01 NOTE — Consult Note (Signed)
   [facility] CM Inpatient Consult   01/01/2016  [name] 1941-01-07 [id number]  Patient screened for potential [place] Management services. Patient is eligible for [facility] Care Management services under patient's Medicare  Plan.  Primary Care Provider as Dr. [name].  This is the patient's first admission in 6 months noted.  Chart review reveals the patient, Carlos Gibson a 75 y.o.malewho is here for increased shortness of breath over the past 2-3 weeks, worsening orthopnea, class III-like symptoms of hisnonischemic cardiomyopathy/ systolic heart failure. He does state however that over the past 2-3 weeks he has been increasing shortness of breath, dyspnea on exertion, orthopnea. Weight has increased to 181 from 174 dry weight.  Chart review reveals the patient has been assigned EMMI HF follow up by inpatient RNCM. Met with the patient at the bedside to inform him of the [place] Management benefits for his acute HF management.  Patient was given a brochure with the 24 hour nurse advise line magnet and contact information.  Patient verbalized understanding.  For questions contact:   Natividad Brood, RN BSN [facility] Liaison  [phone] business mobile phone Toll free office [phone]

## 2016-01-01 NOTE — Progress Notes (Signed)
Patient Name: Carlos Gibson Date of Encounter: 01/01/2016  Hospital Problem List     Principal Problem:   Acute on chronic systolic CHF (congestive heart failure) (HCC) Active Problems:   Essential hypertension   Bifascicular block   Hyperlipidemia   PVC (premature ventricular contraction)   Secondary pulmonary hypertension (HCC)   Hypokalemia   CKD (chronic kidney disease), stage III    Subjective   Feeling somewhat better but not all the way. Still with SOB on exertion. No CP. No LEE. Used to eat spam and drink tons of water but now pays attention to sodium/fluid restriction.  Inpatient Medications    . alfuzosin  10 mg Oral Daily  . aspirin EC  81 mg Oral Daily  . atorvastatin  20 mg Oral Daily  . finasteride  5 mg Oral Daily  . furosemide  80 mg Intravenous BID  . heparin  5,000 Units Subcutaneous Q8H  . Influenza vac split quadrivalent PF  0.5 mL Intramuscular Tomorrow-1000  . insulin aspart  0-9 Units Subcutaneous TID WC  . losartan  25 mg Oral Daily  . potassium chloride SA  20 mEq Oral Daily  . sodium chloride flush  3 mL Intravenous Q12H    Vital Signs    Vitals:   01/01/16 0022 01/01/16 0614 01/01/16 0748 01/01/16 1058  BP: 117/66 118/73 (!) 106/56 (!) 108/58  Pulse: 85 79 80 68  Resp: 16 16 20 20   Temp: 97.7 F (36.5 C) 97.7 F (36.5 C) 98.1 F (36.7 C) 97.7 F (36.5 C)  TempSrc: Oral Oral Oral   SpO2: 99% 98% 97% 100%  Weight:  172 lb 9.6 oz (78.3 kg)    Height:        Intake/Output Summary (Last 24 hours) at 01/01/16 1212 Last data filed at 01/01/16 1120  Gross per 24 hour  Intake              720 ml  Output             3325 ml  Net            -2605 ml   Filed Weights   12/31/15 1555 01/01/16 0614  Weight: 175 lb 11.3 oz (79.7 kg) 172 lb 9.6 oz (78.3 kg)    Physical Exam    General: Well developed, well nourished, in no acute distress HEENT: Normocephalic, atraumatic, sclera non-icteric, no xanthomas, nares are without  discharge. Neck: Negative for carotid bruits. JVP not elevated. Lungs: Diminished BS throughout but no wheezes, rales or rhonchi. Breathing is unlabored. Cardiac: RRR S1 S2 without murmurs, rubs. +S3 Abdomen: Soft, non-tender, non-distended with normoactive bowel sounds. No rebound/guarding. Extremities: No clubbing or cyanosis. No edema. Distal pedal pulses are 2+ and equal bilaterally. Skin: Warm and dry, no significant rash. Neuro: Alert and oriented X 3. Strength and sensation in tact. Psych:  Responds to questions appropriately with a normal affect.  Labs    CBC  Recent Labs  12/31/15 1629  WBC 4.6  NEUTROABS 2.3  HGB 13.1  HCT 39.7  MCV 88.0  PLT 888*   Basic Metabolic Panel  Recent Labs  12/31/15 1629 01/01/16 0337  NA 141 141  K 3.7 3.1*  CL 107 106  CO2 26 27  GLUCOSE 97 138*  BUN 20 19  CREATININE 1.64* 1.62*  CALCIUM 9.1 8.8*   Liver Function Tests  Recent Labs  12/31/15 1629  AST 34  ALT 53  ALKPHOS 47  BILITOT 2.3*  PROT 6.0*  ALBUMIN 3.7    Telemetry    NSR freq PVCs  Radiology    Dg Chest Port 1 View  Result Date: 01/01/2016 CLINICAL DATA:  Shortness of Breath EXAM: PORTABLE CHEST 1 VIEW COMPARISON:  Aug 15, 2015 FINDINGS: There is no edema or consolidation. There is cardiomegaly, stable. The pulmonary vascularity is normal. There is atherosclerotic calcification in the aorta. No adenopathy. No bone lesions. IMPRESSION: Stable cardiomegaly. No edema or consolidation. There is aortic atherosclerosis. Electronically Signed   By: Lowella Grip III M.D.   On: 01/01/2016 07:38   Patient Profile     59M with NICM/chronic systolic CHF (LHC 06/8099 with normal cors & EF 25-30%; last echo 05/2015 with EF 35-40%), intolerance to prior Toprol/carvedilol due to "feeling bad," secondary pulmonary hypertension, frequent PVCs, aortic root dilatation, bifasicular block (RBBB + LAFB), diabetes, Gilbert's syndrome, mild AI/MR by echo 05/2015,  hyperlipidemia, thrombocytopenia, probable CKD stage III (Cr 1.56 in 08/2015) presented with symptoms of worsening CHF.   Assessment & Plan    1. Acute on chronic systolic CHF with secondary pulm HTN - dry weight difficult to ascertain. Said to be 174 but admit weight yesterday by ED was 175 although office weight was 182. He was 170 when he f/u after diuresis in 08/2015. He feels somewhat better but not all the way. Cr relatively stable. Consider one more day of IV diuresis with possible conversion to oral form tomorrow. Echo pending.  2. Bifascicular block, possibly now trifasicular - office EKG with reported 1st degree AVB plus known RBBB & LAFB. Has not been scanned in yet. No evidence of bradycardia on telemetry. H/o intolerance to BB.  3. CKD stage III - follow with diuresis.  4. Frequent PVCs - consider 48 hour holter at discharge to quantify PVC burden given his non-ischemic cardiomyopathy.   5. Hypokalemia - will increase repletion today. K is 3.1 with 29meq daily. Will give 68meq now and increase to 71meq BID starting QHS.  Signed, Charlie Pitter, PA-C  01/01/2016, 12:12 PM

## 2016-01-02 ENCOUNTER — Telehealth: Payer: Self-pay | Admitting: Physician Assistant

## 2016-01-02 ENCOUNTER — Other Ambulatory Visit: Payer: Self-pay | Admitting: Physician Assistant

## 2016-01-02 ENCOUNTER — Encounter (HOSPITAL_COMMUNITY): Payer: Self-pay | Admitting: *Deleted

## 2016-01-02 DIAGNOSIS — I493 Ventricular premature depolarization: Secondary | ICD-10-CM

## 2016-01-02 DIAGNOSIS — I5022 Chronic systolic (congestive) heart failure: Secondary | ICD-10-CM

## 2016-01-02 LAB — BASIC METABOLIC PANEL
Anion gap: 6 (ref 5–15)
BUN: 18 mg/dL (ref 6–20)
CALCIUM: 9 mg/dL (ref 8.9–10.3)
CO2: 29 mmol/L (ref 22–32)
CREATININE: 1.56 mg/dL — AB (ref 0.61–1.24)
Chloride: 104 mmol/L (ref 101–111)
GFR calc Af Amer: 48 mL/min — ABNORMAL LOW (ref >60)
GFR, EST NON AFRICAN AMERICAN: 42 mL/min — AB (ref >60)
GLUCOSE: 145 mg/dL — AB (ref 65–99)
POTASSIUM: 4.2 mmol/L (ref 3.5–5.1)
SODIUM: 139 mmol/L (ref 135–145)

## 2016-01-02 LAB — GLUCOSE, CAPILLARY
GLUCOSE-CAPILLARY: 149 mg/dL — AB (ref 65–99)
Glucose-Capillary: 237 mg/dL — ABNORMAL HIGH (ref 65–99)

## 2016-01-02 MED ORDER — POTASSIUM CHLORIDE CRYS ER 20 MEQ PO TBCR: 40.0000 meq | EXTENDED_RELEASE_TABLET | Freq: Every day | ORAL | 2 refills | Status: DC

## 2016-01-02 MED ORDER — FUROSEMIDE 40 MG PO TABS: 80.0000 mg | ORAL_TABLET | Freq: Every day | ORAL | 2 refills | Status: DC

## 2016-01-02 MED ORDER — POTASSIUM CHLORIDE CRYS ER 10 MEQ PO TBCR: 40.0000 meq | EXTENDED_RELEASE_TABLET | Freq: Every day | ORAL | 2 refills | Status: DC

## 2016-01-02 NOTE — Telephone Encounter (Signed)
New message      TCM appt on 01-09-16 per dayna dunn

## 2016-01-02 NOTE — Addendum Note (Signed)
Addended by: Domenica Reamer R on: 01/02/2016 10:59 AM   Modules accepted: Orders

## 2016-01-02 NOTE — Discharge Summary (Addendum)
Discharge Summary    Patient ID: Carlos Gibson,  MRN: [id number], DOB/AGE: 1940/06/07 75 y.o.  Admit date: 12/31/2015 Discharge date: 01/02/2016  Primary Care Provider: [name] Primary Cardiologist: Marlou Porch  Discharge Diagnoses    Principal Problem:   Acute on chronic systolic CHF (congestive heart failure) ([place]) Active Problems:   Essential hypertension   Bifascicular block   Hyperlipidemia   Frequent PVCs   Secondary pulmonary hypertension ([place])   Hypokalemia   CKD (chronic kidney disease), stage III   Diagnostic Studies/Procedures    N/A _____________   History of Present Illness & Hospital Course    Carlos Gibson is a 75 year old man with history of NICM/chronic systolic CHF (LHC 0/0762 with normal cors & EF 25-30%; last echo 05/2015 with EF 35-40%), intolerance to prior Toprol/carvedilol due to "feeling bad," secondary pulmonary hypertension, frequent PVCs, aortic root dilatation, bifasicular block (RBBB + LAFB -> possible trifascicular block 12/2015 with 1st degree AVB), diabetes, Gilbert's syndrome, mild AI/MR by echo 05/2015, hyperlipidemia, thrombocytopenia, probable CKD stage III based on historical creatinine (Cr 1.56 in 08/2015) who presented to [facility] with worsening SOB and weight gain. Dry weight somewhat difficult to ascertain. It was felt to be 174 per Dr. Kingsley Plan note. His office weight after prior diuresis in 08/2015 was 170lb. He was 182 in the office yesterday but when weighed in the ED this admission was 175lb. He was admitted to the hospital for suspected CHF. BNP 1865, Cr 1.64, Hgb 13.1, Plt 145. CXR with stable cardiomegaly. UDS was normal. He was placed on IV Lasix 80mg  BID. Potassium was repleted. He diuresed from 175lb on admission to 170lb today upon discharge, -3.880L. He feels well today. Dr. Debara Pickett has seen and examined the patient today and feels he is stable for discharge. He will be discharged on Lasix 80mg  daily (up from 60) and  potassium 78meq daily - he prefers 41meq tabs because they are smaller. (Called pharmacy to clarify dose strength.) The patient has history of frequent PVCs - question whether this is contributing to his cardiomyopathy. Will arrange 48-hour Holter monitoring to quantify PVC burden. I had to leave a message on our coordinator, [employer]. He will also have a f/u echo to reassess LV function as outpatient on 01/15/16. I have arranged for 7 day TOC visit. Consider repeat BMET at that visit given Lasix/potassium change. He also already has a repeat appt in November with Nell Range - this will need to be reassessed whether or not he needs this visit at time of f/u.  Of note, when Dr. Debara Pickett rounded this morning, the patient's family member (used to work for Dr. [employer] Mor[employer]) was upset that the patient's metformin was held and he was placed on SSI while inpatient. It was explained that this was done as a precaution due to his renal dysfunction in case of acute worsening. He was asked to review his diabetes regimen with his primary doctor to make sure it is monitored closely given his CrCl <45. Per Dr. Marlou Porch' note 08/2015 she was previously unhappy with other aspects of his care (note citing "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 Explained how I was informed with telephone messages and discussed with my care team, Kelli Churn who relayed information back to her. She also pointed finger at me and states that I told him to drink a gallon of fluid a day.")  _____________  Discharge Vitals Blood pressure 102/66, pulse 72, temperature 97.8 F (36.6 C), temperature source Oral, resp. rate 18, height 5\' 11"  (1.803 m), weight 170 lb 11.2 oz (77.4 kg), SpO2 100 %.  Filed Weights   12/31/15 1555 01/01/16 0614 01/02/16 0454  Weight: 175 lb 11.3 oz (79.7 kg) 172 lb 9.6 oz (78.3 kg) 170 lb 11.2 oz (77.4 kg)    Labs &  Radiologic Studies    CBC  Recent Labs  12/31/15 1629  WBC 4.6  NEUTROABS 2.3  HGB 13.1  HCT 39.7  MCV 88.0  PLT 756*   Basic Metabolic Panel  Recent Labs  01/01/16 0337 01/02/16 0326  NA 141 139  K 3.1* 4.2  CL 106 104  CO2 27 29  GLUCOSE 138* 145*  BUN 19 18  CREATININE 1.62* 1.56*  CALCIUM 8.8* 9.0   Liver Function Tests  Recent Labs  12/31/15 1629  AST 34  ALT 53  ALKPHOS 47  BILITOT 2.3*  PROT 6.0*  ALBUMIN 3.7   ____________  Dg Chest Port 1 View  Result Date: 01/01/2016 CLINICAL DATA:  Shortness of Breath EXAM: PORTABLE CHEST 1 VIEW COMPARISON:  Aug 15, 2015 FINDINGS: There is no edema or consolidation. There is cardiomegaly, stable. The pulmonary vascularity is normal. There is atherosclerotic calcification in the aorta. No adenopathy. No bone lesions. IMPRESSION: Stable cardiomegaly. No edema or consolidation. There is aortic atherosclerosis. Electronically Signed   By: Lowella Grip III M.D.   On: 01/01/2016 07:38   Disposition   Pt is being discharged home today in good condition.  Follow-up Plans & Appointments    Follow-up Information    [name], MD .   Specialty:  Cardiology Why:  Dr. Marlou Porch' office will call you to arrange a 48-hour heart monitor to wear to see if your frequent PVCs are happening often enough to make your heart weak.  Contact information: [address] [address] [phone]        [facility] Follow up on 01/15/2016.   Specialty:  Cardiology Why:  01/15/16 at 1pm - you are scheduled for a repeat heart ultrasound.  Contact information: [address] [place] [facility]       Angelena Form, PA-C Follow up on 01/09/2016.   Specialties:  Cardiology, Radiology Why:  01/09/16 at 1:30pm - you will have a close follow-up with Nell Range, one of the PAs that works closely with Dr. Marlou Porch. Contact  information: [address] [place] [address] [phone]          Discharge Instructions    Diet - low sodium heart healthy    Complete by:  As directed    Increase activity slowly    Complete by:  As directed    Please pay attention to NEW DOSES of FUROSEMIDE (LASIX/fluid pill) and POTASSIUM.  Please review your diabetes regimen with your primary care provider. Metformin can be a less ideal medicine in patients with kidney dysfunction. We held this in the hospital briefly in case your kidney function worsened. It is stable today but needs to be carefully monitored as an outpatient by your primary care provider.      Discharge Medications     Medication List    TAKE these medications   alfuzosin 10 MG 24 hr tablet Commonly known as:  UROXATRAL Take 10 mg by mouth daily.   aspirin EC 81 MG  tablet Take 81 mg by mouth daily.   cholecalciferol 1000 units tablet Commonly known as:  VITAMIN D Take 1,000 Units by mouth every other day.   finasteride 5 MG tablet Commonly known as:  PROSCAR Take 5 mg by mouth daily.   furosemide 40 MG tablet Commonly known as:  LASIX Take 2 tablets (80 mg total) by mouth daily. What changed:  how much to take  Another medication with the same name was removed. Continue taking this medication, and follow the directions you see here.   losartan 25 MG tablet Commonly known as:  COZAAR TAKE 1 TABLET(25 MG) BY MOUTH DAILY   metFORMIN 500 MG tablet Commonly known as:  GLUCOPHAGE Take 500 mg by mouth 2 (two) times daily.   potassium chloride 10 MEQ tablet Commonly known as:  K-DUR,KLOR-CON Take 4 tablets (40 mEq total) by mouth daily. What changed:  medication strength  how much to take        Allergies:  Allergies  Allergen Reactions  . Ace Inhibitors Cough  . Coreg [Carvedilol] Other (See Comments)    "feels bad"  . Toprol Xl [Metoprolol Succinate Er] Other (See Comments)    "feels bad"     Outstanding Labs/Studies   N/A  Duration of Discharge Encounter   Greater than 30 minutes including physician time.  Signed, Nedra Hai Jayanna Kroeger PA-C 01/02/2016, 1:03 PM

## 2016-01-02 NOTE — Progress Notes (Signed)
Patient Name: Carlos Gibson Date of Encounter: 01/02/2016  Hospital Problem List     Principal Problem:   Acute on chronic systolic CHF (congestive heart failure) (HCC) Active Problems:   Essential hypertension   Bifascicular block   Hyperlipidemia   PVC (premature ventricular contraction)   Secondary pulmonary hypertension (HCC)   Hypokalemia   CKD (chronic kidney disease), stage III    Subjective   Feeling somewhat better but not all the way. Still with SOB on exertion. No CP. No LEE. Used to eat spam and drink tons of water but now pays attention to sodium/fluid restriction.  Inpatient Medications    . alfuzosin  10 mg Oral Daily  . aspirin EC  81 mg Oral Daily  . atorvastatin  20 mg Oral Daily  . finasteride  5 mg Oral Daily  . furosemide  80 mg Oral Daily  . heparin  5,000 Units Subcutaneous Q8H  . insulin aspart  0-9 Units Subcutaneous TID WC  . losartan  25 mg Oral Daily  . potassium chloride SA  40 mEq Oral BID  . sodium chloride flush  3 mL Intravenous Q12H    Vital Signs    Vitals:   01/01/16 1723 01/01/16 2054 01/02/16 0454 01/02/16 1122  BP: 101/67 96/68 118/75 102/66  Pulse: 93 85 77 72  Resp:  20 20 18   Temp:  97.7 F (36.5 C) 97.9 F (36.6 C) 97.8 F (36.6 C)  TempSrc:  Oral Oral Oral  SpO2:  99% 100% 100%  Weight:   170 lb 11.2 oz (77.4 kg)   Height:        Intake/Output Summary (Last 24 hours) at 01/02/16 1137 Last data filed at 01/02/16 1029  Gross per 24 hour  Intake             1375 ml  Output             2650 ml  Net            -1275 ml   Filed Weights   12/31/15 1555 01/01/16 0614 01/02/16 0454  Weight: 175 lb 11.3 oz (79.7 kg) 172 lb 9.6 oz (78.3 kg) 170 lb 11.2 oz (77.4 kg)    Physical Exam    General: Well developed, well nourished, in no acute distress HEENT: Normocephalic, atraumatic, sclera non-icteric, no xanthomas, nares are without discharge. Neck: Negative for carotid bruits. JVP not elevated. Lungs: Diminished  BS throughout but no wheezes, rales or rhonchi. Breathing is unlabored. Cardiac: RRR S1 S2 without murmurs, rubs. +S3 Abdomen: Soft, non-tender, non-distended with normoactive bowel sounds. No rebound/guarding. Extremities: No clubbing or cyanosis. No edema. Distal pedal pulses are 2+ and equal bilaterally. Skin: Warm and dry, no significant rash. Neuro: Alert and oriented X 3. Strength and sensation in tact. Psych:  Responds to questions appropriately with a normal affect.  Labs    CBC  Recent Labs  12/31/15 1629  WBC 4.6  NEUTROABS 2.3  HGB 13.1  HCT 39.7  MCV 88.0  PLT 751*   Basic Metabolic Panel  Recent Labs  01/01/16 0337 01/02/16 0326  NA 141 139  K 3.1* 4.2  CL 106 104  CO2 27 29  GLUCOSE 138* 145*  BUN 19 18  CREATININE 1.62* 1.56*  CALCIUM 8.8* 9.0   Liver Function Tests  Recent Labs  12/31/15 1629  AST 34  ALT 53  ALKPHOS 47  BILITOT 2.3*  PROT 6.0*  ALBUMIN 3.7    Telemetry  NSR freq PVCs  Radiology    Dg Chest Port 1 View  Result Date: 01/01/2016 CLINICAL DATA:  Shortness of Breath EXAM: PORTABLE CHEST 1 VIEW COMPARISON:  Aug 15, 2015 FINDINGS: There is no edema or consolidation. There is cardiomegaly, stable. The pulmonary vascularity is normal. There is atherosclerotic calcification in the aorta. No adenopathy. No bone lesions. IMPRESSION: Stable cardiomegaly. No edema or consolidation. There is aortic atherosclerosis. Electronically Signed   By: Lowella Grip III M.D.   On: 01/01/2016 07:38   Patient Profile     33M with NICM/chronic systolic CHF (LHC 12/5318 with normal cors & EF 25-30%; last echo 05/2015 with EF 35-40%), intolerance to prior Toprol/carvedilol due to "feeling bad," secondary pulmonary hypertension, frequent PVCs, aortic root dilatation, bifasicular block (RBBB + LAFB), diabetes, Gilbert's syndrome, mild AI/MR by echo 05/2015, hyperlipidemia, thrombocytopenia, probable CKD stage III (Cr 1.56 in 08/2015) presented with  symptoms of worsening CHF.   Assessment & Plan    1. Acute on chronic systolic CHF with secondary pulm HTN - dry weight difficult to ascertain. Said to be 174 but admit weight yesterday by ED was 175 although office weight was 182. He was 170 when he f/u after diuresis in 08/2015. Weight now down to 170. Diuresed about 4L negative. Will switch to po lasix today.   2. Bifascicular block, possibly now trifasicular - office EKG with reported 1st degree AVB plus known RBBB & LAFB. Has not been scanned in yet. No evidence of bradycardia on telemetry. H/o intolerance to BB.  3. CKD stage III - follow with diuresis. Creatinine improved with diuresis.  4. Frequent PVCs - consider 48 hour holter at discharge to quantify PVC burden given his non-ischemic cardiomyopathy.  5. Hypokalemia - potassium has normalized today with repletion.   6. DM2 - metformin held yesterday due to elevated creatinine, however that is improving. Was on SSI. Restart metformin on discharge.  Can discharge this afternoon. Would re-check echo as an outpatient. Follow-up with Dr. Marlou Porch after 48 hour Holter monitoring.  Pixie Casino, MD, [facility] Attending Cardiologist [place], MD  01/02/2016, 11:37 AM

## 2016-01-03 NOTE — Telephone Encounter (Signed)
Patient contacted regarding discharge from [facility] on 01/02/16.  Patient understands to follow up with provider K. Denham, [place] on Thursday Oct. 5 at 1:30 pm at [facility]. Patient understands discharge instructions? Yes Patient understands medications and regiment? Yes Patient understands to bring all medications to this visit? Yes  Patient denies complaints or concerns at this time.  I advised him to call back with questions or concerns prior to office visit. He thanked me for the call.

## 2016-01-08 ENCOUNTER — Ambulatory Visit (INDEPENDENT_AMBULATORY_CARE_PROVIDER_SITE_OTHER): Payer: Medicare Other | Admitting: Physician Assistant

## 2016-01-08 ENCOUNTER — Ambulatory Visit (INDEPENDENT_AMBULATORY_CARE_PROVIDER_SITE_OTHER): Payer: Medicare Other

## 2016-01-08 ENCOUNTER — Other Ambulatory Visit: Payer: Self-pay | Admitting: Physician Assistant

## 2016-01-08 ENCOUNTER — Encounter: Payer: Self-pay | Admitting: Physician Assistant

## 2016-01-08 VITALS — BP 108/60 | HR 78 | Ht 71.0 in | Wt 170.0 lb

## 2016-01-08 DIAGNOSIS — I5022 Chronic systolic (congestive) heart failure: Secondary | ICD-10-CM

## 2016-01-08 DIAGNOSIS — I453 Trifascicular block: Secondary | ICD-10-CM

## 2016-01-08 DIAGNOSIS — I493 Ventricular premature depolarization: Secondary | ICD-10-CM

## 2016-01-08 DIAGNOSIS — E118 Type 2 diabetes mellitus with unspecified complications: Secondary | ICD-10-CM

## 2016-01-08 DIAGNOSIS — E785 Hyperlipidemia, unspecified: Secondary | ICD-10-CM

## 2016-01-08 DIAGNOSIS — N189 Chronic kidney disease, unspecified: Secondary | ICD-10-CM

## 2016-01-08 LAB — BASIC METABOLIC PANEL
BUN: 22 mg/dL (ref 7–25)
CALCIUM: 9.7 mg/dL (ref 8.6–10.3)
CO2: 27 mmol/L (ref 20–31)
CREATININE: 1.58 mg/dL — AB (ref 0.70–1.18)
Chloride: 103 mmol/L (ref 98–110)
Glucose, Bld: 119 mg/dL — ABNORMAL HIGH (ref 65–99)
Potassium: 4.8 mmol/L (ref 3.5–5.3)
SODIUM: 139 mmol/L (ref 135–146)

## 2016-01-08 NOTE — Progress Notes (Signed)
Cardiology Office Note    Date:  01/08/2016   ID:  Carlos Gibson, DOB 06/28/1940, MRN [id number]  PCP:  [name], MD  Cardiologist:  Dr. Marlou Porch  CC: post hospital follow up  History of Present Illness:  Carlos Gibson is a 75 y.o. 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   Recently admitted 9/26-9/28/17 to [facility]. He presented with worsening SOB and weight gain. Dry weight somewhat difficult to ascertain. He was admitted to the hospital for suspected CHF. BNP 1865, Cr 1.64, Hgb 13.1, Plt 145. CXR with stable cardiomegaly. He was placed on IV Lasix 80mg  BID. Potassium was repleted. He diuresed from 175lb on admission to 170lb today upon discharge,-3.880L. He was discharged on Lasix 80mg  daily (up from 60) and potassium 7meq daily - he prefers 76meq tabs because they are smaller. The patient has history of frequent PVCs - question whether this is contributing to his cardiomyopathy. 48-hour Holter monitoring to quantify PVC burden planned (will be set up today). He will also have a f/u echo to reassess LV function as outpatient on 01/15/16.  Today he presents to clinic for follow up. No LE edema, orthopnea or PND. No dizziness or syncope. No chest pain. No blood in stool urine. Getting set up for a monitor today and echo on 01/15/16. Feeling just great.     Past Medical History:  Diagnosis Date  . Aortic root dilation (HCC)   . Aortic valve regurgitation   . Bifascicular block   . Chronic systolic CHF (congestive heart failure) ([place])   .  CKD (chronic kidney disease), stage III 01/01/2016  . Diabetes ([place])   . Ejection fraction < 50%   . Frequent PVCs   . Gilbert's syndrome   . Hyperlipidemia   . Mild diastolic dysfunction   . NICM (nonischemic cardiomyopathy) ([place])   . Pulmonary hypertension   . Right BBB/left post fasc block   . Thrombocytopenia ([place])     Past Surgical History:  Procedure Laterality Date  . CARDIAC CATHETERIZATION N/A 12/20/2014   Procedure: Right/Left Heart Cath and Coronary Angiography;  Surgeon: Jerline Pain, MD;  Location: [place] [facility];  Service: Cardiovascular;  Laterality: N/A;    Current Medications: Outpatient Medications Prior to Visit  Medication Sig Dispense Refill  . alfuzosin (UROXATRAL) 10 MG 24 hr tablet Take 10 mg by mouth daily.  3  . aspirin EC 81 MG tablet Take 81 mg by mouth daily.    . cholecalciferol (VITAMIN D) 1000 units tablet Take 1,000 Units by mouth every other day.    . finasteride (PROSCAR) 5 MG tablet Take 5 mg by mouth daily.   2  . furosemide (LASIX) 40 MG tablet Take 2 tablets (80 mg total) by mouth daily. 60 tablet 2  . losartan (COZAAR) 25 MG tablet TAKE 1 TABLET(25 MG) BY MOUTH DAILY 30 tablet 11  . metFORMIN (GLUCOPHAGE) 500 MG tablet Take 500 mg by mouth 2 (two) times daily.   3  . potassium chloride SA (K-DUR,KLOR-CON) 10 MEQ tablet Take 4 tablets (40 mEq total) by mouth daily. 120 tablet 2  No facility-administered medications prior to visit.      Allergies:   Ace inhibitors; Coreg [carvedilol]; and Toprol xl [metoprolol succinate er]   Social History   Social History  . Marital status: Married    Spouse name: N/A  . Number of children: N/A  . Years of education: N/A   Social History Main Topics  . Smoking status: Never Smoker  . Smokeless tobacco: Never Used  . Alcohol use None  . Drug use: Unknown  . Sexual activity: Not Asked   Other Topics Concern  . None   Social History Narrative  . None     Family History:  The patient's  family history includes Hypertension in his mother; Irregular heart beat in his father; Stroke in his mother; Unexplained death in his father.     ROS:   Please see the history of present illness.    ROS All other systems reviewed and are negative.   PHYSICAL EXAM:   VS:  BP 108/60   Pulse 78   Ht 5\' 11"  (1.803 m)   Wt 170 lb (77.1 kg)   SpO2 97%   BMI 23.71 kg/m    GEN: Well nourished, well developed, in no acute distress  HEENT: normal  Neck: no JVD, carotid bruits, or masses Cardiac: RRR; no murmurs, rubs, or gallops,no edema  Respiratory:  clear to auscultation bilaterally, normal work of breathing GI: soft, nontender, nondistended, + BS MS: no deformity or atrophy  Skin: warm and dry, no rash Neuro:  Alert and Oriented x 3, Strength and sensation are intact Psych: euthymic mood, full affect  Wt Readings from Last 3 Encounters:  01/08/16 170 lb (77.1 kg)  01/02/16 170 lb 11.2 oz (77.4 kg)  12/31/15 181 lb (82.1 kg)      Studies/Labs Reviewed:   EKG:  EKG is NOT ordered today.    Recent Labs: 12/31/2015: ALT 53; B Natriuretic Peptide 1,865.4; Hemoglobin 13.1; Platelets 145 01/02/2016: BUN 18; Creatinine, Ser 1.56; Potassium 4.2; Sodium 139   Lipid Panel No results found for: CHOL, TRIG, HDL, CHOLHDL, VLDL, LDLCALC, LDLDIRECT  Additional studies/ records that were reviewed today include:  CATH: 12/20/14  There is severe left ventricular systolic dysfunction.  Ejection fraction between 25-30% with global hypokinesis  Moderately elevated pulmonary pressures-mean PA pressure 35 mmHg consistent with secondary pulmonary hypertension as a result of increased left ventricular end-diastolic pressure  No angiographically significant coronary artery disease present.  Nonischemic cardiomyopathy.Left ventricular ejection fraction 25-30%. Left ventricular end-diastolic pressure 24 mmHg. Cardiac output 3.7 L/m with cardiac index of 1.8.    ASSESSMENT & PLAN:   Acute  on chronic systolic CHF with secondary pulm HTN: discharged weight 170 lbs.  Today weight stable at 170 lbs.  He was discharged on Lasix 80mg  daily (up from 60) and potassium 4meq daily. The patient has history of frequent PVCs - question whether this is contributing to his cardiomyopathy. 48-hour Holter monitoring to quantify PVC burden planned (will be set up today). He will also have a f/u echo to reassess LV function as outpatient on 01/15/16. Check BMET today  Trifasicular block:  1st degree AVB plus RBBB & LAFB. No evidence of bradycardia on telemetry during most recent admission. H/o intolerance to BB.   CKD stage III: Creatinine 1.56 at discharge. BMET today  Frequent PVCs: 48 hour holter to quantify PVC burden given his non-ischemic cardiomyopathy.   DM2: continiue current reigmen. Follow up with PCP  HTN: BP well controlled today  Medication  Adjustments/Labs and Tests Ordered: Current medicines are reviewed at length with the patient today.  Concerns regarding medicines are outlined above.  Medication changes, Labs and Tests ordered today are listed in the Patient Instructions below. Patient Instructions  Medication Instructions:  Your physician recommends that you continue on your current medications as directed. Please refer to the Current Medication list given to you today.   Labwork: TODAY:  BMET  Testing/Procedures: None ordered  Follow-Up: Your physician recommends that you schedule a follow-up appointment in: [place]   Any Other Special Instructions Will Be Listed Below (If Applicable).   If you need a refill on your cardiac medications before your next appointment, please call your pharmacy.      Signed, Angelena Form, PA-C  01/08/2016 3:34 PM    [facility] [place], [place], [place]  [address] Phone: [phone]; Fax: [phone]

## 2016-01-08 NOTE — Progress Notes (Deleted)
Cardiology Office Note    Date:  01/08/2016   ID:  VANG KRAEGER, DOB 1940-09-19, MRN [id number]  PCP:  [name], MD  Cardiologist:  Dr. Marlou Porch  CC: post hospital follow up  History of Present Illness:  Carlos Gibson is a 75 y.o. 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    Recently admitted 9/26-9/28/17 to [facility]. He presented with worsening SOB and weight gain. Dry weight somewhat difficult to ascertain. It was felt to be 174 per Dr. Kingsley Plan note. His office weight after prior diuresis in 08/2015 was 170lb. He was 182 in the office yesterday but when weighed in the ED this admission was 175lb. He was admitted to the hospital for suspected CHF. BNP 1865, Cr 1.64, Hgb 13.1, Plt 145. CXR with stable cardiomegaly. UDS was normal. He was placed on IV Lasix 80mg  BID. Potassium was repleted. He diuresed from 175lb on admission to 170lb today upon discharge, -3.880L. He was discharged on Lasix 80mg  daily (up from 60) and potassium 76meq daily - he prefers 3meq tabs because they are smaller. The patient has history of frequent PVCs - question whether this is contributing to his cardiomyopathy. 48-hour Holter monitoring to quantify PVC burden planned (will be set up today). He will also have a f/u echo to reassess LV function as outpatient on 01/15/16.  Today he presents to clinic for follow up.     Past Medical History:  Diagnosis Date  . Aortic root dilation (HCC)   . Aortic valve regurgitation   .  Bifascicular block   . Chronic systolic CHF (congestive heart failure) ([place])   . CKD (chronic kidney disease), stage III 01/01/2016  . Diabetes ([place])   . Ejection fraction < 50%   . Frequent PVCs   . Gilbert's syndrome   . Hyperlipidemia   . Mild diastolic dysfunction   . NICM (nonischemic cardiomyopathy) ([place])   . Pulmonary hypertension   . Right BBB/left post fasc block   . Thrombocytopenia ([place])     Past Surgical History:  Procedure Laterality Date  . CARDIAC CATHETERIZATION N/A 12/20/2014   Procedure: Right/Left Heart Cath and Coronary Angiography;  Surgeon: Jerline Pain, MD;  Location: [place] [facility];  Service: Cardiovascular;  Laterality: N/A;    Current Medications: Outpatient Medications Prior to Visit  Medication Sig Dispense Refill  . alfuzosin (UROXATRAL) 10 MG 24 hr tablet Take 10 mg by mouth daily.  3  . aspirin EC 81 MG tablet Take 81 mg by mouth daily.    . cholecalciferol (VITAMIN D) 1000 units tablet Take 1,000 Units by mouth every other day.    . finasteride (PROSCAR) 5 MG tablet Take 5 mg by mouth daily.   2  . furosemide (LASIX) 40 MG tablet Take 2 tablets (80 mg total) by mouth daily. 60 tablet 2  . losartan (COZAAR) 25 MG tablet TAKE 1 TABLET(25 MG) BY MOUTH DAILY 30 tablet 11  . metFORMIN (GLUCOPHAGE) 500 MG tablet Take 500 mg by mouth 2 (two) times daily.   3  . potassium chloride SA (K-DUR,KLOR-CON) 10  MEQ tablet Take 4 tablets (40 mEq total) by mouth daily. 120 tablet 2   No facility-administered medications prior to visit.      Allergies:   Ace inhibitors; Coreg [carvedilol]; and Toprol xl [metoprolol succinate er]   Social History   Social History  . Marital status: Married    Spouse name: N/A  . Number of children: N/A  . Years of education: N/A   Social History Main Topics  . Smoking status: Never Smoker  . Smokeless tobacco: Never Used  . Alcohol use Not on file  . Drug use: Unknown  . Sexual activity: Not on file   Other Topics  Concern  . Not on file   Social History Narrative  . No narrative on file     Family History:  The patient's family history includes Hypertension in his mother; Irregular heart beat in his father; Stroke in his mother; Unexplained death in his father.     ROS:   Please see the history of present illness.    ROS All other systems reviewed and are negative.   PHYSICAL EXAM:   VS:  There were no vitals taken for this visit.   GEN: Well nourished, well developed, in no acute distress  HEENT: normal  Neck: no JVD, carotid bruits, or masses Cardiac: ***RRR; no murmurs, rubs, or gallops,no edema  Respiratory:  clear to auscultation bilaterally, normal work of breathing GI: soft, nontender, nondistended, + BS MS: no deformity or atrophy  Skin: warm and dry, no rash Neuro:  Alert and Oriented x 3, Strength and sensation are intact Psych: euthymic mood, full affect  Wt Readings from Last 3 Encounters:  01/02/16 170 lb 11.2 oz (77.4 kg)  12/31/15 181 lb (82.1 kg)  12/03/15 179 lb (81.2 kg)      Studies/Labs Reviewed:   EKG:  EKG is*** ordered today.  The ekg ordered today demonstrates ***  Recent Labs: 12/31/2015: ALT 53; B Natriuretic Peptide 1,865.4; Hemoglobin 13.1; Platelets 145 01/02/2016: BUN 18; Creatinine, Ser 1.56; Potassium 4.2; Sodium 139   Lipid Panel No results found for: CHOL, TRIG, HDL, CHOLHDL, VLDL, LDLCALC, LDLDIRECT  Additional studies/ records that were reviewed today include:  CATH: 12/20/14  There is severe left ventricular systolic dysfunction.  Ejection fraction between 25-30% with global hypokinesis  Moderately elevated pulmonary pressures-mean PA pressure 35 mmHg consistent with secondary pulmonary hypertension as a result of increased left ventricular end-diastolic pressure  No angiographically significant coronary artery disease present.  Nonischemic cardiomyopathy.Left ventricular ejection fraction 25-30%. Left ventricular end-diastolic  pressure 24 mmHg. Cardiac output 3.7 L/m with cardiac index of 1.8.   ASSESSMENT & PLAN:   Acute on chronic systolic CHF with secondary pulm HTN: discharged weight 170 lbs. He was discharged on Lasix 80mg  daily (up from 60) and potassium 44meq daily. The patient has history of frequent PVCs - question whether this is contributing to his cardiomyopathy. 48-hour Holter monitoring to quantify PVC burden planned (will be set up today). He will also have a f/u echo to reassess LV function as outpatient on 01/15/16. Check BMET today  Trifasicular block:  1st degree AVB plus RBBB & LAFB. Has not been scanned in yet. No evidence of bradycardia on telemetry during most recent admission. H/o intolerance to BB.  CKD stage III: Creatinine 1.56 at discharge. BMET today  Frequent PVCs: 48 hour holter to quantify PVC burden given his non-ischemic cardiomyopathy.   DM2: continiue current reigmen. Follow up with PCP     Medication Adjustments/Labs  and Tests Ordered: Current medicines are reviewed at length with the patient today.  Concerns regarding medicines are outlined above.  Medication changes, Labs and Tests ordered today are listed in the Patient Instructions below. There are no Patient Instructions on file for this visit.   Signed, Angelena Form, PA-C  01/08/2016 11:36 AM    [facility] [place], [place], [place]  [address] Phone: [phone]; Fax: [phone]

## 2016-01-08 NOTE — Patient Instructions (Addendum)
Medication Instructions:  Your physician recommends that you continue on your current medications as directed. Please refer to the Current Medication list given to you today.   Labwork: TODAY:  BMET  Testing/Procedures: None ordered  Follow-Up: Your physician recommends that you schedule a follow-up appointment in: [place]   Any Other Special Instructions Will Be Listed Below (If Applicable).   If you need a refill on your cardiac medications before your next appointment, please call your pharmacy.

## 2016-01-09 ENCOUNTER — Ambulatory Visit: Payer: Medicare Other | Admitting: Physician Assistant

## 2016-01-13 ENCOUNTER — Other Ambulatory Visit: Payer: Self-pay

## 2016-01-13 NOTE — Patient Outreach (Addendum)
[name]) Care Management  01/13/2016  Carlos Gibson 1940/06/15 [id number]   EMMI- HF RED ON EMMI ALERT Day # 2 Date: 01/10/16 Red Alert Reason: "Weighed themselves today? No"     Outreach call back to patient. Spoke with patient. Addressed red alert. He states that he is weighing himself daily. Weight this am 170 lbs. Patient reported that MD had told him that this should be his goal weight range. 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 He reports that sometimes he does not have a chance to weigh prior to automated call. Patient able to verbalize when to alert MD of weight changes. He denies any swelling and/or edema at this time. Spoke with patient is regards to s/s of worsening condition and he voiced understanding. He had cardiologist(Dr. Marlou Porch) f/u appt on 01/08/16. Patient states that he was told to take all his meds first thing in the morning at the same time. He reports he starts to feel a little funny after taking meds and has some dizziness and lightheadedness. Reviewed meds with patient. Patient taking two anti-hypertensive meds and Lasix at once. Discussed possible issues related to BP dropping and causing him to feel funny. He reports that he "thinks" he has BP machine in the home that he used to use but stopped using. Encouraged patient to start monitoring BP. Also, instructed with patient to discuss med administration times with MD to see if he can spread out when he takes meds. He voiced understanding and will call MD to discuss. Advised patient that he would continue to get EMMI-HF automated calls and if any of his responses were abnormal or out of he ordinary it would trigger a call from a nurse. He voiced understanding and was appreciative of call.     Plan: RN CM will notify [facility] administrative assistant of case closure status. RN CM will send educational info to patient on HF and HTN via mail.  Enzo Montgomery,  RN,BSN,CCM [place] [facility] Telephonic Care Management Coordinator Direct Phone: [phone] Toll Free: [phone] Fax: [phone]

## 2016-01-13 NOTE — Patient Outreach (Signed)
[facility]) Care Management  01/13/2016  Carlos Gibson 01/05/1941 [id number]       EMMI- HF RED ON EMMI ALERT Day # 2 Date: 01/10/16 Red Alert Reason: "Weighed themselves today? No"    Outreach attempt #1 to patient. Spoke with patient who reported he was currently driving and requested call back at another time.      Plan: RN CM will make outreach attempt to patient within one business day.  Enzo Montgomery, RN,BSN,CCM [place] [facility] Telephonic Care Management Coordinator Direct Phone: [phone] Toll Free: [phone] Fax: [phone]

## 2016-01-15 ENCOUNTER — Ambulatory Visit (HOSPITAL_COMMUNITY): Payer: Medicare Other | Attending: Cardiovascular Disease

## 2016-01-15 ENCOUNTER — Other Ambulatory Visit: Payer: Self-pay

## 2016-01-15 DIAGNOSIS — E785 Hyperlipidemia, unspecified: Secondary | ICD-10-CM | POA: Insufficient documentation

## 2016-01-15 DIAGNOSIS — I313 Pericardial effusion (noninflammatory): Secondary | ICD-10-CM | POA: Insufficient documentation

## 2016-01-15 DIAGNOSIS — I110 Hypertensive heart disease with heart failure: Secondary | ICD-10-CM | POA: Insufficient documentation

## 2016-01-15 DIAGNOSIS — I083 Combined rheumatic disorders of mitral, aortic and tricuspid valves: Secondary | ICD-10-CM | POA: Insufficient documentation

## 2016-01-15 DIAGNOSIS — I428 Other cardiomyopathies: Secondary | ICD-10-CM | POA: Insufficient documentation

## 2016-01-15 DIAGNOSIS — I4510 Unspecified right bundle-branch block: Secondary | ICD-10-CM | POA: Diagnosis not present

## 2016-01-15 DIAGNOSIS — I5022 Chronic systolic (congestive) heart failure: Secondary | ICD-10-CM | POA: Diagnosis not present

## 2016-01-16 ENCOUNTER — Encounter: Payer: Self-pay | Admitting: Physician Assistant

## 2016-01-23 ENCOUNTER — Encounter: Payer: Self-pay | Admitting: Cardiology

## 2016-02-04 ENCOUNTER — Encounter: Payer: Self-pay | Admitting: Cardiology

## 2016-02-04 ENCOUNTER — Ambulatory Visit (INDEPENDENT_AMBULATORY_CARE_PROVIDER_SITE_OTHER): Payer: Medicare Other | Admitting: Cardiology

## 2016-02-04 VITALS — BP 120/70 | HR 99 | Ht 71.0 in | Wt 176.4 lb

## 2016-02-04 DIAGNOSIS — I493 Ventricular premature depolarization: Secondary | ICD-10-CM

## 2016-02-04 DIAGNOSIS — I5022 Chronic systolic (congestive) heart failure: Secondary | ICD-10-CM

## 2016-02-04 MED ORDER — BISOPROLOL FUMARATE 5 MG PO TABS: 5.0000 mg | ORAL_TABLET | Freq: Every day | ORAL | 6 refills | Status: DC

## 2016-02-04 MED ORDER — BISOPROLOL FUMARATE 5 MG PO TABS: 2.5000 mg | ORAL_TABLET | Freq: Every day | ORAL | 2 refills | Status: DC

## 2016-02-04 NOTE — Addendum Note (Signed)
Addended by: Stanton Kidney on: 02/04/2016 04:35 PM   Modules accepted: Orders

## 2016-02-04 NOTE — Progress Notes (Signed)
Electrophysiology Office Note   Date:  02/04/2016   ID:  Carlos Gibson, DOB 11-21-1940, MRN [id number]  PCP:  [name], MD  Cardiologist:  Marlou Porch Primary Electrophysiologist:  Jahlil Ziller Meredith Leeds, MD    Chief Complaint  Patient presents with  . Congestive Heart Failure     History of Present Illness: Carlos Gibson is a 75 y.o. male who presents today for electrophysiology evaluation.   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   Recently admitted 9/26-9/28/17 to [facility]. He presentedwith worsening SOB and weight gain.  He diuresed from 175lb on admission to 170lb today upon discharge,-3.880L. He was discharged on Lasix 80mg  daily (up from 60) and potassium 71meq daily. The patient has history of frequent PVCs.   Today, he denies symptoms of palpitations, chest pain,  orthopnea, PND, lower extremity edema, claudication, dizziness, presyncope, syncope, bleeding, or neurologic sequela. The patient is tolerating medications without difficulties. He does say that he at times gets short of breath with exertion.He says that he is able to continue to work, but has noted increased shortness of breath.     Past Medical History:  Diagnosis Date  . Aortic regurgitation    a. mild AI by echo 01/2016.  [name] Aortic root dilation (HCC)   . Aortic valve regurgitation   . Bifascicular block   . Chronic systolic CHF (congestive heart failure) ([place])   . CKD (chronic kidney disease), stage III 01/01/2016  . Diabetes ([place])   . Ejection fraction < 50%   . Frequent PVCs   . Gilbert's syndrome   . Hyperlipidemia   . Mild  diastolic dysfunction   . Mitral regurgitation    a. mod by echo 10/207.  [name] NICM (nonischemic cardiomyopathy) ([place])   . Pericardial effusion    a. small by echo 01/2016.  . Pulmonary hypertension   . Right BBB/left post fasc block   . Thrombocytopenia ([place])    Past Surgical History:  Procedure Laterality Date  . CARDIAC CATHETERIZATION N/A 12/20/2014   Procedure: Right/Left Heart Cath and Coronary Angiography;  Surgeon: Jerline Pain, MD;  Location: [facility];  Service: Cardiovascular;  Laterality: N/A;     Current Outpatient Prescriptions  Medication Sig Dispense Refill  . alfuzosin (UROXATRAL) 10 MG 24 hr tablet Take 10 mg by mouth daily.  3  . aspirin EC 81 MG tablet Take 81 mg by mouth daily.    . cholecalciferol (VITAMIN D) 1000 units tablet Take 1,000 Units by mouth every other day.    . finasteride (PROSCAR) 5 MG tablet Take 5 mg by mouth daily.   2  . furosemide (LASIX) 40 MG tablet Take 2 tablets (80 mg total) by mouth daily. 60 tablet 2  . losartan (COZAAR) 25 MG tablet TAKE 1 TABLET(25 MG) BY MOUTH DAILY 30 tablet 11  . metFORMIN (GLUCOPHAGE) 500 MG tablet Take 500 mg by mouth 2 (two) times daily.   3  . potassium chloride SA (K-DUR,KLOR-CON) 10 MEQ tablet Take 4 tablets (40 mEq total) by mouth daily. 120 tablet 2   No current facility-administered medications for this visit.     Allergies:   Ace inhibitors; Coreg [carvedilol]; and Toprol  xl [metoprolol succinate er]   Social History:  The patient  reports that he has never smoked. He has never used smokeless tobacco. He reports that he does not drink alcohol or use drugs.   Family History:  The patient's family history includes Hypertension in his mother; Irregular heart beat in his father; Stroke in his mother; Unexplained death in his father.    ROS:  Please see the history of present illness.   Otherwise, review of systems is positive for DOE.   All other systems are reviewed and negative.    PHYSICAL  EXAM: VS:  BP 120/70   Pulse 99   Ht 5\' 11"  (1.803 m)   Wt 176 lb 6.4 oz (80 kg)   BMI 24.60 kg/m  , BMI Body mass index is 24.6 kg/m. GEN: Well nourished, well developed, in no acute distress  HEENT: normal  Neck: no JVD, carotid bruits, or masses Cardiac: RRR; no murmurs, rubs, or gallops,no edema  Respiratory:  clear to auscultation bilaterally, normal work of breathing GI: soft, nontender, nondistended, + BS MS: no deformity or atrophy  Skin: warm and dry Neuro:  Strength and sensation are intact Psych: euthymic mood, full affect  EKG:  EKG is not ordered today. Personal review of the ekg ordered shows Sinus rhythm, first-degree AV block, right bundle branch block, left anterior fascicular block  Recent Labs: 12/31/2015: ALT 53; B Natriuretic Peptide 1,865.4; Hemoglobin 13.1; Platelets 145 01/08/2016: BUN 22; Creat 1.58; Potassium 4.8; Sodium 139    Lipid Panel  No results found for: CHOL, TRIG, HDL, CHOLHDL, VLDL, LDLCALC, LDLDIRECT   Wt Readings from Last 3 Encounters:  02/04/16 176 lb 6.4 oz (80 kg)  01/08/16 170 lb (77.1 kg)  01/02/16 170 lb 11.2 oz (77.4 kg)      Other studies Reviewed: Additional studies/ records that were reviewed today include: TTE 01/15/16, Holter 01/08/16  Review of the above records today demonstrates:  - Left ventricle: The cavity size was severely dilated. Wall   thickness was increased in a pattern of mild LVH. Systolic   function was severely reduced. The estimated ejection fraction   was in the range of 25% to 30%. Diffuse hypokinesis. The study is   not technically sufficient to allow evaluation of LV diastolic   function. - Aortic valve: There was mild regurgitation. - Mitral valve: There was moderate regurgitation. - Right ventricle: The cavity size was mildly dilated. - Right atrium: The atrium was mildly dilated. - Atrial septum: No defect or patent foramen ovale was identified. - Pulmonary arteries: PA peak pressure: 47 mm Hg  (S). - Pericardium, extracardiac: Small posterior lateral pericardial   effusion   17000 PVC's - frequent, occasional couplets, rare triplets.  1500 PAC's   ASSESSMENT AND PLAN:  1.  Nonischemic cardiomyopathy: currently on losartan but has not been able to tolerate coreg or Toprol XL. I stressed the importance of beta blockers for therapy for heart failure. Hasani Diemer therefore try him on bisoprolol and monitor him for the next few months. We'll get an echocardiogram to reassess his ejection fraction. If his ejection not improved, Cass Edinger plan for ICD placement at that time.  2. PVCs: high burden on monitor. Having up to 17000 PVCs over 48 hours which equates to 7% of total beats. Unlikely to cause decrease in EF.  Iori Gigante monitor for now.  3. Hypertension: well controlled  Current medicines are reviewed at length with the patient today.   The patient does not have concerns regarding  his medicines.  The following changes were made today:  bisoprolol  Labs/ tests ordered today include:  No orders of the defined types were placed in this encounter.    Disposition:   FU with Damien Batty 4 months  Signed, Hondo Nanda Meredith Leeds, MD  02/04/2016 3:50 PM     [place]  [phone] (office) [phone] (fax)

## 2016-02-04 NOTE — Patient Instructions (Addendum)
Medication Instructions:    Your physician has recommended you make the following change in your medication:   1) START Bisoprolol 2.5 mg once daily  --- If you need a refill on your cardiac medications before your next appointment, please call your pharmacy. ---  Labwork:  None ordered  Testing/Procedures: Your physician has requested that you have an echocardiogram in March 2018. Echocardiography is a painless test that uses sound waves to create images of your heart. It provides your doctor with information about the size and shape of your heart and how well your heart's chambers and valves are working. This procedure takes approximately one hour. There are no restrictions for this procedure.  Follow-Up:  Your physician wants you to follow-up in: 6 months with Dr. Curt Bears.  You will receive a reminder letter in the mail two months in advance. If you don't receive a letter, please call our office to schedule the follow-up appointment.  Thank you for choosing [facility]!!   Trinidad Curet, RN [phone]  Any Other Special Instructions Will Be Listed Below (If Applicable).  Bisoprolol tablets What is this medicine? BISOPROLOL (bis [place] proe lol) is a beta-blocker. Beta-blockers reduce the workload on the heart and help it to beat more regularly. This medicine is used to treat high blood pressure. This medicine may be used for other purposes; ask your health care provider or pharmacist if you have questions. What should I tell my health care provider before I take this medicine? They need to know if you have any of these conditions: -chest pain (angina) -diabetes -heart or vessel disease like slow heart rate, worsening heart failure, heart block, sick sinus syndrome or Raynaud's disease -kidney disease -liver disease -lung or breathing disease, like asthma or emphysema -pheochromocytoma -thyroid disease -an unusual or allergic reaction to bisoprolol, other beta-blockers,  medicines, foods, dyes, or preservatives -pregnant or trying to get pregnant -breast-feeding How should I use this medicine? Take this medicine by mouth with a glass of water. Follow the directions on the prescription label. You can take this medicine with or without food. Take your doses at regular intervals. Do not take your medicine more often than directed. Do not stop taking this medicine suddenly. This could lead to serious heart-related effects. Talk to your pediatrician regarding the use of this medicine in children. Special care may be needed. Overdosage: If you think you have taken too much of this medicine contact a poison control center or emergency room at once. NOTE: This medicine is only for you. Do not share this medicine with others. What if I miss a dose? If you miss a dose, take it as soon as you can. If it is almost time for your next dose, take only that dose. Do not take double or extra doses. What may interact with this medicine? This medicine may interact with the following medications: -certain medicines for blood pressure, heart disease, irregular heart beat -NSAIDs, medicines for pain and inflammation, like ibuprofen or naproxen -rifampin This list may not describe all possible interactions. Give your health care provider a list of all the medicines, herbs, non-prescription drugs, or dietary supplements you use. Also tell them if you smoke, drink alcohol, or use illegal drugs. Some items may interact with your medicine. What should I watch for while using this medicine? Visit your doctor or health care professional for regular checks on your progress. Check your heart rate and blood pressure regularly while you are taking this medicine. Ask your doctor or  health care professional what your heart rate and blood pressure should be, and when you should contact him or her. You may get drowsy or dizzy. Do not drive, use machinery, or do anything that needs mental alertness  until you know how this drug affects you. Do not stand or sit up quickly, especially if you are an older patient. This reduces the risk of dizzy or fainting spells. Alcohol can make you more drowsy and dizzy. Avoid alcoholic drinks. This medicine can affect blood sugar levels. If you have diabetes, check with your doctor or health care professional before you change your diet or the dose of your diabetic medicine. Do not treat yourself for coughs, colds, or pain while you are taking this medicine without asking your doctor or health care professional for advice. Some ingredients may increase your blood pressure. What side effects may I notice from receiving this medicine? Side effects that you should report to your doctor or health care professional as soon as possible: -allergic reactions like skin rash, itching or hives, swelling of the face, lips, or tongue -breathing problems -chest pain -cold, tingling, or numb hands or feet -confusion -irregular, slow heartbeat -muscle aches and pains -sweating -swollen legs or ankles -tremors -vomiting Side effects that usually do not require medical attention (report to your doctor or health care professional if they continue or are bothersome): -anxiety -change in sex drive or performance -depression -diarrhea -dry or burning eyes -headache -nausea This list may not describe all possible side effects. Call your doctor for medical advice about side effects. You may report side effects to FDA at 1-800-FDA-1088. Where should I keep my medicine? Keep out of the reach of children. Store at room temperature between 20 and 25 degrees C (68 and 77 degrees F). Protect from moisture. Throw away any unused medicine after the expiration date. NOTE: This sheet is a summary. It may not cover all possible information. If you have questions about this medicine, talk to your doctor, pharmacist, or health care provider.    2016, [employer].  (2012-11-25 14:30:04)

## 2016-02-05 ENCOUNTER — Telehealth: Payer: Self-pay | Admitting: Internal Medicine

## 2016-02-05 NOTE — Telephone Encounter (Signed)
Mr. Carlos Gibson called the after hours line due to having complaints of dizziness and intermittent shortness of breath.  He reports that he was placed on bisoprolol that he took for the first time this afternoon.  He did check his blood pressure at home while we spoke and stated the top number was 149 and his heart rate was reported to be 83.  I informed the patient that if his symptoms persist, he should report to the emergency department for closer monitoring.    Liborio Nixon, MD

## 2016-02-06 ENCOUNTER — Telehealth: Payer: Self-pay | Admitting: Cardiology

## 2016-02-06 NOTE — Telephone Encounter (Signed)
New Message  Pt call stating he received a call from the office. Please call back to discuss if needed

## 2016-02-06 NOTE — Telephone Encounter (Signed)
Agree with plan. I would like for him to try to continue his bisoprolol. [name], MD

## 2016-02-06 NOTE — Telephone Encounter (Signed)
Left detailed message explaining that I do not see who called him.  Advised to call office back if he had further questions/concerns.

## 2016-02-07 DIAGNOSIS — Z7984 Long term (current) use of oral hypoglycemic drugs: Secondary | ICD-10-CM | POA: Diagnosis not present

## 2016-02-07 DIAGNOSIS — E119 Type 2 diabetes mellitus without complications: Secondary | ICD-10-CM | POA: Diagnosis not present

## 2016-02-07 NOTE — Telephone Encounter (Signed)
Spoke with pt to f/u that he is taking bisoprolol as RXed.  He states he has only taken it once because of how it made him feel.  He reports eating breakfast this am and then taking his medications.  Several hours he states he felt dizzy and had diarrhea.  He then called the MD on call.  It is unsure of whether he had eaten anything else throughout the day.  He reports just leaving Dr Sherryll Burger office but is unsure if there where any medication changes made.  Pt will call back after he gets back home to review medications.

## 2016-02-10 ENCOUNTER — Other Ambulatory Visit: Payer: Self-pay

## 2016-02-10 ENCOUNTER — Telehealth: Payer: Self-pay | Admitting: Cardiology

## 2016-02-10 NOTE — Progress Notes (Signed)
Cardiology Office Note    Date:  02/11/2016   ID:  Carlos Gibson, DOB 03-15-1941, MRN [id number]  PCP:  [name], MD  Cardiologist:  Dr. Marlou Porch Primary Electrophysiologist:  [name], MD       Chief Complaint:  Discussion of Bisoprolol  History of Present Illness:   Carlos Gibson is a 75 y.o. male with hx of NICM/chronic systolic CHF (LHC 06/5007 with normal cors &EF 25-30%; last echo 01/2016 with EF 25-30%), intolerance to prior Toprol/carvedilol due to "feeling bad," secondary pulmonary hypertension, frequent PVCs, aortic root dilatation, Trifasicular block (RBBB + LAFB + 1st degree AVB), diabetes, Gilbert's syndrome, mild AI/moderate MR by echo 10//2017, hyperlipidemia, thrombocytopenia, probable CKD stage III who  Recently admitted 12/2015 for acute on chronic systolic CHF with secondary pulmonary hypertension. Good result with IV diuresis. The patient was evaluated by Dr. Curt Bears for ICD consideration in clinic 10/31/1 7. He was started on bisoprolol and get repeat echo in few months and reevaluate at that time. Also has high burden of PVCs on 48 hours monitor (~7%).   The patient came here today to discuss bisoprolol with wife. He had episode of severe dizziness, fatigue and "sickness" after first dose of bisoprolol. He hasn't took afterwards. Wife is leading conversation with one statement "why doctor put my husband on this medication if has weak heart" while husband mostly sitting quietly. Apparently done "lot of research about side effects". She does not know indication of bisoprolol. She was upset with Dr. Curt Bears prior to coming here. She did not wished to seen by APP initially. Reassurance given by me however she did not wanted to listen and preconceive notion of side effect of bisoprolol. Patient hasn't checked his pulse or blood pressure during episode of "sickness" or even after. No syncope, chest pain, palpitations, orthopnea, PND. Pulse of 92 bpm  here.   Past Medical History:  Diagnosis Date  . Aortic regurgitation    a. mild AI by echo 01/2016.  [name] Aortic root dilation (HCC)   . Aortic valve regurgitation   . Bifascicular block   . Chronic systolic CHF (congestive heart failure) ([place])   . CKD (chronic kidney disease), stage III 01/01/2016  . Diabetes ([place])   . Ejection fraction < 50%   . Frequent PVCs   . Gilbert's syndrome   . Hyperlipidemia   . Mild diastolic dysfunction   . Mitral regurgitation    a. mod by echo 10/207.  [name] NICM (nonischemic cardiomyopathy) ([place])   . Pericardial effusion    a. small by echo 01/2016.  . Pulmonary hypertension   . Right BBB/left post fasc block   . Thrombocytopenia ([place])     Past Surgical History:  Procedure Laterality Date  . CARDIAC CATHETERIZATION N/A 12/20/2014   Procedure: Right/Left Heart Cath and Coronary Angiography;  Surgeon: Jerline Pain, MD;  Location: [facility];  Service: Cardiovascular;  Laterality: N/A;    Current Medications: Prior to Admission medications   Medication Sig Start Date End Date Taking? Authorizing Provider  alfuzosin (UROXATRAL) 10 MG 24 hr tablet Take 10 mg by mouth daily. 01/18/14   Historical Provider, MD  aspirin EC 81 MG tablet Take 81 mg by mouth daily.    Historical Provider, MD  bisoprolol (ZEBETA) 5 MG tablet Take 0.5 tablets (2.5 mg total) by mouth daily. 02/04/16   [name], MD  cholecalciferol (VITAMIN D) 1000 units tablet Take 1,000 Units by mouth every other day.  Historical Provider, MD  finasteride (PROSCAR) 5 MG tablet Take 5 mg by mouth daily.  02/08/14   Historical Provider, MD  furosemide (LASIX) 40 MG tablet Take 2 tablets (80 mg total) by mouth daily. 01/02/16   Dayna N Dunn, PA-C  losartan (COZAAR) 25 MG tablet TAKE 1 TABLET(25 MG) BY MOUTH DAILY 05/22/15   Jerline Pain, MD  metFORMIN (GLUCOPHAGE) 500 MG tablet Take 500 mg by mouth 2 (two) times daily.  01/05/14   Historical Provider, MD  potassium chloride SA  (K-DUR,KLOR-CON) 10 MEQ tablet Take 4 tablets (40 mEq total) by mouth daily. 01/02/16   Dayna N Dunn, PA-C    Allergies:   Ace inhibitors; Coreg [carvedilol]; and Toprol xl [metoprolol succinate er]   Social History   Social History  . Marital status: Married    Spouse name: N/A  . Number of children: N/A  . Years of education: N/A   Social History Main Topics  . Smoking status: Never Smoker  . Smokeless tobacco: Never Used  . Alcohol use No  . Drug use: No  . Sexual activity: Not Asked   Other Topics Concern  . None   Social History Narrative  . None     Family History:  The patient's family history includes Hypertension in his mother; Irregular heart beat in his father; Stroke in his mother; Unexplained death in his father.  ROS:   Please see the history of present illness.    ROS All other systems reviewed and are negative.   PHYSICAL EXAM:   VS:  BP 122/62 (BP Location: Left Arm, Patient Position: Sitting, Cuff Size: Large)   Pulse 94   Ht 5\' 11"  (1.803 m)   Wt 174 lb 12.8 oz (79.3 kg)   SpO2 98%   BMI 24.38 kg/m    GEN: Well groomed male in no acute distress who sitting quietly and listening conversation of wife HEENT: normal  Neck: no JVD, carotid bruits, or masses Cardiac: RRR; no murmurs, rubs, or gallops,no edema  Respiratory:  clear to auscultation bilaterally, normal work of breathing GI: soft, nontender, nondistended, + BS MS: no deformity or atrophy  Skin: warm and dry, no rash Neuro:  Alert and Oriented x 3, Strength and sensation are intact Psych: euthymic mood, full affect  Wt Readings from Last 3 Encounters:  02/11/16 174 lb 12.8 oz (79.3 kg)  02/04/16 176 lb 6.4 oz (80 kg)  01/08/16 170 lb (77.1 kg)      Studies/Labs Reviewed:   EKG:  EKG is not  ordered today.    Recent Labs: 12/31/2015: ALT 53; B Natriuretic Peptide 1,865.4; Hemoglobin 13.1; Platelets 145 01/08/2016: BUN 22; Creat 1.58; Potassium 4.8; Sodium 139   Lipid Panel No  results found for: CHOL, TRIG, HDL, CHOLHDL, VLDL, LDLCALC, LDLDIRECT  Additional studies/ records that were reviewed today include:   Echo 01/2016 LV EF: 25% -   30%  ------------------------------------------------------------------- Indications:      (I50.22).  ------------------------------------------------------------------- History:   PMH:  Non ischemic cardiomyopathy. RBBB. Acquired from the patient and from the patient&'s chart.  Congestive heart failure.  Mitral valve disease.  Aortic valve disease.  Risk factors:  Hypertension. Dyslipidemia.  ------------------------------------------------------------------- Study Conclusions  - Left ventricle: The cavity size was severely dilated. Wall   thickness was increased in a pattern of mild LVH. Systolic   function was severely reduced. The estimated ejection fraction   was in the range of 25% to 30%. Diffuse hypokinesis. The study is  not technically sufficient to allow evaluation of LV diastolic   function. - Aortic valve: There was mild regurgitation. - Mitral valve: There was moderate regurgitation. - Right ventricle: The cavity size was mildly dilated. - Right atrium: The atrium was mildly dilated. - Atrial septum: No defect or patent foramen ovale was identified. - Pulmonary arteries: PA peak pressure: 47 mm Hg (S). - Pericardium, extracardiac: Small posterior lateral pericardial   effusion  Right/Left Heart Cath and Coronary Angiography  12/20/14  Conclusion    There is severe left ventricular systolic dysfunction.  Ejection fraction between 25-30% with global hypokinesis  Moderately elevated pulmonary pressures-mean PA pressure 35 mmHg consistent with secondary pulmonary hypertension as a result of increased left ventricular end-diastolic pressure  No angiographically significant coronary artery disease present.   Nonischemic cardiomyopathy. Left ventricular ejection fraction 25-30%. Left ventricular  end-diastolic pressure 24 mmHg. Cardiac output 3.7 L/m with cardiac index of 1.8.  We'll continue with aggressive medical management. Up titration of medications    ASSESSMENT & PLAN:    1. NICM - Intolerance to Toprol and Coreg. Started on Bisoprolol by Dr. Curt Bears 02/04/16 and plan to repeat echo in few month and reevaluate for ICD at that time.Weight of 176lb during last office visit. Weight of 174  Today.  - LHC 12/2014 with normal cors &EF 25-30%; last echo 01/2016 with EF 25-30%.  - Episode of lightheadedness with "sickness after first dose of bisoprolol. He hasn't checked his vitals at that time. HR of 92 today. Very difficult situation and had to educate wife. Dr. Marlou Porch came by, who tried to convinced and educate the wife and patient. Wife initially had flat affect and thretened to change provider. After long discussion and education (more than 25 minutes), patient appreciated the care was given by MD. He is willing to tray 1.25mg  bisoprolol at night. Wife somewhat agreed and said "my husband can try if he wants'.   2.PVCS - High burden (~75) on monitor recently.   3. HTN - stable and well controlled on current regimen.   Medication Adjustments/Labs and Tests Ordered: Current medicines are reviewed at length with the patient today.  Concerns regarding medicines are outlined above.  Medication changes, Labs and Tests ordered today are listed in the Patient Instructions below. Patient Instructions  Medication Instructions:  Your physician has recommended you make the following change in your medication:  1) DECREASE Bisoprolol to 1/4 tablet (1.25 mg total) at bedtime.  Labwork: None ordered  Testing/Procedures: None ordered  Follow-Up: Keep currently scheduled appointments/ follow up.  If you need a refill on your cardiac medications before your next appointment, please call your pharmacy.      Jarrett Soho, [place]  02/11/2016 3:20 PM    [facility] [place], [place],   [address] Phone: [phone]; Fax: [phone]   Personally seen and examined. Agree with above. I had lengthy discussion with he and his wife. She was at first confrontational about the use of bisoprolol and questioned its use. She asked why are we giving him something that would weaken his heart. I explained to her several times that beta blockers are used in heart failure to help strengthen myocardial performance. I also relayed to her that we must remain collegial and respect each other in order for [place] to have a working/effective doctor-patient relationship. I agree with trying a lower dose bisoprolol in the evening hours. He seems amenable to this. I would like for him to  give it a few days before aborting therapy.  [name], MD

## 2016-02-10 NOTE — Telephone Encounter (Signed)
New message    [facility] calling   appt with App on  11.7.2017 @ 11:30   Pt c/o medication issue:  1. Name of Medication: bisoprolol (ZEBETA) 5 MG tablet  2. How are you currently taking this medication (dosage and times per day)? 5 mg   3. Are you having a reaction (difficulty breathing--STAT)? No   4. What is your medication issue? Patient is not taken medication - b/c feel like he going to passed out -

## 2016-02-10 NOTE — Patient Outreach (Signed)
[facility]) Care Management  02/10/2016  Carlos Gibson 03/09/1941 [id number]  REFERRAL DATE; 02/10/16 REFERRAL SOURCE: EMMI heart failure red alert REFERRAL REASON:  New / worsening symptoms: YES New swelling: YES New/ worsening shortness of breath: YES CONSENT:  Self/ patient  PROVIDERS: Dr. Lajean Gibson - primary MD Dr. Lacie Gibson - cardiology  SOCIAL: Patient reports he lives at home with wife. Patient is self care and provides own transportation Patient has son, Carlos Gibson that is also contact for him  SUBJECTIVE: Telephone call to patient regarding EMMI heart failure red alert referral.  Explained to patient call reason was due to recent automated call / questions for EMMI heart failure program.   Patient states he has been having a little shortness of breath and swelling. Patient states he has the shortness of breathing when he is walking.  Denies shortness of breath at rest.  Patient states he is weighing daily and recording. Patient reports weight to be 171lbs. Patient states weight has been unchanged over the last 2 days. Patient states he only has slight swelling in his feet/ ankles. Patient states he has had these symptoms for 3-4 months.  Reports these symptoms have not been that bad.  Patient reports he was seen at his primary MD office by covering doctor.  Patient states his metformin was discontinued and he was started on Januvia. Patient states he  was given 30 day sample of Januvia.  Patient states he did not check his blood sugar this morning but states yesterday's blood sugar reading was 203 fasting. Patient reports he is taking his Januvia as well as all of his medications with the exception of Bisoprolol.  RNCM advised patient to monitor and record his blood sugars daily. Keep scheduled follow up appointments with doctors.  RNCM discussed with patient symptoms of  Heart failure and guidelines of when to contact primary MD. Reinforced with patient  to continue to weigh daily and record.  Discussed contacting doctors office for a weight gain of 3lbs over night and 5 lbs in 1 week. Patient verbalized understanding.  Patient reports he has a medication, bisoprolol that he does not take. Patient states Dr. Etter Gibson wants him to take the medication but patient states the medication makes him feel lightheaded like he will pass out. Patient states he was suppose to make a follow up appointment with Dr. Etter Gibson. RNCM offered to call and set up appointment with Dr.Skaines. Patient verbalized agreement. Patient states he has a wrist blood pressure monitor.  States he changed the battery in it but has not been using frequently.  Discussed with patient appropriate way to use wrist blood pressure monitor. Advised to continue to check daily and record.   ASSESSMENT:  Principal Problem:   Acute on chronic systolic CHF (congestive heart failure) (HCC) Active Problems:   Essential hypertension   Bifascicular block   Hyperlipidemia   Frequent PVCs   Secondary pulmonary hypertension ([place])   Hypokalemia   CKD (chronic kidney disease), stage III  Recent hospital admission from 12/31/15 to 01/02/16 for congestive heart failure. 75 year old man with history of NICM/chronic systolic CHF (LHC 08/4268 with normal cors &EF 25-30%; last echo 05/2015 with EF 35-40%),   RNCM contacted Dr. Etter Gibson office and spoke to [place] to schedule patient appointment due to not taking bisoprolol and side effect.  Carlos Gibson schedule patient appointment with physician assistant for 02/11/16 at 11:30am.  Carlos Gibson will also have nurse from office to call patient and follow up.  RNCM  contacted patient and informed him of appointment with Dr. Etter Sjogren PA on 02/11/16.  PLAN:  RNCM will follow up with patient within 2 weeks.  RNCM will send patient   Carlos Plowman RN,BSN,CCM Carlos Gibson Telephonic  [phone]

## 2016-02-10 NOTE — Telephone Encounter (Signed)
Attempted to reach the pt but got his voicemail.  I left a message in regards to appointment scheduled tomorrow for follow-up.

## 2016-02-11 ENCOUNTER — Encounter: Payer: Self-pay | Admitting: Physician Assistant

## 2016-02-11 ENCOUNTER — Ambulatory Visit (INDEPENDENT_AMBULATORY_CARE_PROVIDER_SITE_OTHER): Payer: Medicare Other | Admitting: Physician Assistant

## 2016-02-11 VITALS — BP 122/62 | HR 94 | Ht 71.0 in | Wt 174.8 lb

## 2016-02-11 DIAGNOSIS — E785 Hyperlipidemia, unspecified: Secondary | ICD-10-CM

## 2016-02-11 DIAGNOSIS — I428 Other cardiomyopathies: Secondary | ICD-10-CM

## 2016-02-11 DIAGNOSIS — I493 Ventricular premature depolarization: Secondary | ICD-10-CM

## 2016-02-11 DIAGNOSIS — I10 Essential (primary) hypertension: Secondary | ICD-10-CM

## 2016-02-11 MED ORDER — BISOPROLOL FUMARATE 5 MG PO TABS: ORAL_TABLET | ORAL | 2 refills | Status: DC

## 2016-02-11 NOTE — Patient Instructions (Signed)
Medication Instructions:  Your physician has recommended you make the following change in your medication:  1) DECREASE Bisoprolol to 1/4 tablet (1.25 mg total) at bedtime.  Labwork: None ordered  Testing/Procedures: None ordered  Follow-Up: Keep currently scheduled appointments/ follow up.  If you need a refill on your cardiac medications before your next appointment, please call your pharmacy.

## 2016-02-11 NOTE — Telephone Encounter (Signed)
Pt has an appt today at 11:30 for further evaluation.

## 2016-02-12 ENCOUNTER — Ambulatory Visit: Payer: Self-pay

## 2016-02-14 ENCOUNTER — Other Ambulatory Visit: Payer: Self-pay

## 2016-02-14 NOTE — Patient Outreach (Signed)
[facility]) Care Management  02/14/2016  Carlos Gibson 02/17/1941 [id number]   EMMI-HF F/U Call   Outreach call to patient to f/u on his condition. Spoke with patient who reported he was unable to talk at this time. He voiced that he would call this RN CM back later.     Plan: RN CM will make outreach attempt to patient within a week if no return call from patient.   Enzo Montgomery, RN,BSN,CCM [place] Management Telephonic Care Management Coordinator Direct Phone: [phone] Toll Free: [phone] Fax: [phone]

## 2016-02-18 ENCOUNTER — Other Ambulatory Visit: Payer: Self-pay

## 2016-02-18 NOTE — Patient Outreach (Signed)
[facility]) Care Management  02/18/2016  Carlos Gibson [birth year] [id number]   EMMI-HF F/U Call    Outreach attempt #2 to patient. Spoke with patient who reports he is doing and feeling much better. He did f/u with MD regarding symptoms he was having from possible medication. Since then med dosage has been decreased. Patient denies any further episodes. He reports weight stable at 171 lbs. He voices mild edema to ankles-nothing new but also states he has not had a chance to take fluid pill this morning yet. Patient states he is monitoring BP several times per week and BP has been WNL. He denies any RN CM needs or concerns at this time.    Plan: RN CM will notify [facility] administrative assistant of case closure status.  Enzo Montgomery, RN,BSN,CCM [place] Management Telephonic Care Management Coordinator Direct Phone: [phone] Toll Free: [phone] Fax: [phone]

## 2016-03-04 ENCOUNTER — Ambulatory Visit: Payer: Medicare Other | Admitting: Physician Assistant

## 2016-03-05 ENCOUNTER — Telehealth: Payer: Self-pay | Admitting: Cardiology

## 2016-03-05 NOTE — Telephone Encounter (Signed)
Spoke with patient who asked me to let Dr Marlou Porch know "he knows exactly what he is doing"  Pt reports he has been able to keep his wt down, he has no wheezing or dizziness.  He checked his BP yesterday and it "wasn't bad" though he doesn't remember what it was.  He will continue to keep a check on it.  Of note - he had been on Bisoprolol 1.25 mg at bedtime but has increased it to 2.5 mg and is tolerating it well.  Dr Marlou Porch is aware of the above and pt will c/b in a few weeks to f/u or prior to then if he has any questions of concerns.

## 2016-03-05 NOTE — Telephone Encounter (Signed)
New message  Wants to discuss a scan  Please call back  [phone]

## 2016-05-06 DIAGNOSIS — M25561 Pain in right knee: Secondary | ICD-10-CM | POA: Diagnosis not present

## 2016-05-06 DIAGNOSIS — Z202 Contact with and (suspected) exposure to infections with a predominantly sexual mode of transmission: Secondary | ICD-10-CM | POA: Diagnosis not present

## 2016-05-13 ENCOUNTER — Other Ambulatory Visit: Payer: Self-pay | Admitting: Cardiology

## 2016-05-22 ENCOUNTER — Encounter: Payer: Self-pay | Admitting: *Deleted

## 2016-06-01 ENCOUNTER — Ambulatory Visit (INDEPENDENT_AMBULATORY_CARE_PROVIDER_SITE_OTHER): Payer: Medicare Other | Admitting: Cardiology

## 2016-06-01 ENCOUNTER — Encounter: Payer: Self-pay | Admitting: Cardiology

## 2016-06-01 VITALS — BP 132/80 | HR 73 | Ht 71.0 in | Wt 188.2 lb

## 2016-06-01 DIAGNOSIS — I5022 Chronic systolic (congestive) heart failure: Secondary | ICD-10-CM

## 2016-06-01 DIAGNOSIS — I428 Other cardiomyopathies: Secondary | ICD-10-CM

## 2016-06-01 DIAGNOSIS — I77810 Thoracic aortic ectasia: Secondary | ICD-10-CM

## 2016-06-01 DIAGNOSIS — I420 Dilated cardiomyopathy: Secondary | ICD-10-CM

## 2016-06-01 MED ORDER — SACUBITRIL-VALSARTAN 24-26 MG PO TABS: 1.0000 | ORAL_TABLET | Freq: Two times a day (BID) | ORAL | 3 refills | Status: DC

## 2016-06-01 NOTE — Patient Instructions (Signed)
Medication Instructions:  Please stop losartan. Start Entresto 24-26 mg one twice a day. Continue all other medications as listed.  Follow-Up: Follow up in 1 month with Bonney Leitz, PA.  If you need a refill on your cardiac medications before your next appointment, please call your pharmacy.  Thank you for choosing [facility]!!

## 2016-06-01 NOTE — Progress Notes (Signed)
Date:  06/01/2016   ID:  Carlos Gibson, DOB 16-Sep-1940, MRN [id number]  PCP:  [name], MD  Cardiologist:   [name], MD     History of Present Illness: Carlos Gibson is a 76 y.o. male with nonischemic cardiomyopathy, dilated cardiomyopathy ejection fraction 25% range here for follow-up.  Had cardiac catheterization which showed normal coronary arteries, nonischemic cardiomyopathy, ejection fraction in the 25% range. His left ventricular end-diastolic pressure was also elevated in the mid 20s. Pulmonary pressures were moderately elevated.  On original presentation, his ejection fraction was found to be 40% in July 2016. 8 pounds fluid gain was noted. BNP was 1410. He had improvements after Lasix administration according to Dr. Felipa Eth previously. Lisinopril was tried but ACE inhibitor cough was produced.  Back in 2014 his ejection fraction was low normal at 50-55%, aortic root was 4 cm. Has a history of PVCs. Previously worked at [employer].  He was taking 60 mg of Lasix daily which is an increase from his 40 mg without any significant relief. He denies eating excessive salt. He also denies any excessive fluid intake.  He states that he has not tolerated carvedilol, Toprol and every time he takes bisoprolol his ankles swell and he feels short of breath. I encouraged him to continue to try the bisoprolol.  Right now, he states that he is not having any shortness of breath. His weight is up since November. Ankle edema.  No chest pain, no syncope, no bleeding. He is having some worsening orthopnea.  Past Medical History:  Diagnosis Date  . Aortic regurgitation    a. mild AI by echo 01/2016.  [name] Aortic root dilation (HCC)   . Aortic valve regurgitation   . Bifascicular block   . Chronic systolic CHF (congestive heart failure) ([place])   . CKD (chronic kidney disease), stage III 01/01/2016  . Diabetes ([place])   . Ejection fraction < 50%   . Frequent PVCs   . Gilbert's  syndrome   . Hyperlipidemia   . Mild diastolic dysfunction   . Mitral regurgitation    a. mod by echo 10/207.  [name] NICM (nonischemic cardiomyopathy) ([place])   . Pericardial effusion    a. small by echo 01/2016.  . Pulmonary hypertension   . Right BBB/left post fasc block   . Thrombocytopenia ([place])     Past Surgical History:  Procedure Laterality Date  . CARDIAC CATHETERIZATION N/A 12/20/2014   Procedure: Right/Left Heart Cath and Coronary Angiography;  Surgeon: Jerline Pain, MD;  Location: [facility];  Service: Cardiovascular;  Laterality: N/A;     Current Outpatient Prescriptions  Medication Sig Dispense Refill  . alfuzosin (UROXATRAL) 10 MG 24 hr tablet Take 10 mg by mouth daily.  3  . aspirin EC 81 MG tablet Take 81 mg by mouth daily.    . bisoprolol (ZEBETA) 5 MG tablet Take as directed 45 tablet 2  . cholecalciferol (VITAMIN D) 1000 units tablet Take 1,000 Units by mouth every other day.    . finasteride (PROSCAR) 5 MG tablet Take 5 mg by mouth daily.   2  . furosemide (LASIX) 40 MG tablet Take 2 tablets (80 mg total) by mouth daily. 60 tablet 2  . glimepiride (AMARYL) 4 MG tablet Take one tablet in the mornings and one-half tablet at night by mouth    . potassium chloride SA (K-DUR,KLOR-CON) 10 MEQ tablet Take 4 tablets (40 mEq total) by mouth daily. 120 tablet 2  .  sacubitril-valsartan (ENTRESTO) 24-26 MG Take 1 tablet by mouth 2 (two) times daily. 60 tablet 3   No current facility-administered medications for this visit.     Allergies:   Ace inhibitors; Coreg [carvedilol]; and Toprol xl [metoprolol tartrate] cough   Social History:  The patient  reports that he has never smoked. He has never used smokeless tobacco. He reports that he does not drink alcohol or use drugs.   Family History:  The patient's family history includes Hypertension in his mother; Irregular heart beat in his father; Stroke in his mother; Unexplained death in his father.    ROS:  Please see  the history of present illness.   Otherwise, review of systems are positive for none.   All other systems are reviewed and negative.    PHYSICAL EXAM: VS:  BP 132/80   Pulse 73   Ht 5\' 11"  (1.803 m)   Wt 188 lb 3.2 oz (85.4 kg)   SpO2 98%   BMI 26.25 kg/m  , BMI Body mass index is 26.25 kg/m. GEN: Well nourished, well developed, in no acute distress  HEENT: normal  Neck: no JVD, carotid bruits, or masses Cardiac: RRR; no murmurs, rubs, + S3 gallops, no edema  Respiratory:  clear to auscultation bilaterally, normal work of breathing GI: soft, nontender, nondistended, + BS MS: no deformity or atrophy  Skin: warm and dry, no rash, bilateral ankle edema noted 2+ Neuro:  Strength and sensation are intact Psych: euthymic mood, full affect   EKG:  12/31/15-sinus rhythm with first-degree AV block PR interval 220 ms, right bundle branch block heart rate 90 with left anterior fascicular block, bifascicular block.  CATH: 12/20/14  There is severe left ventricular systolic dysfunction.  Ejection fraction between 25-30% with global hypokinesis  Moderately elevated pulmonary pressures-mean PA pressure 35 mmHg consistent with secondary pulmonary hypertension as a result of increased left ventricular end-diastolic pressure  No angiographically significant coronary artery disease present.  Nonischemic cardiomyopathy. Left ventricular ejection fraction 25-30%. Left ventricular end-diastolic pressure 24 mmHg. Cardiac output 3.7 L/m with cardiac index of 1.8.   Recent Labs: 12/31/2015: ALT 53; B Natriuretic Peptide 1,865.4; Hemoglobin 13.1; Platelets 145 01/08/2016: BUN 22; Creat 1.58; Potassium 4.8; Sodium 139    Lipid Panel No results found for: CHOL, TRIG, HDL, CHOLHDL, VLDL, LDLCALC, LDLDIRECT    Wt Readings from Last 3 Encounters:  06/01/16 188 lb 3.2 oz (85.4 kg)  02/11/16 174 lb 12.8 oz (79.3 kg)  02/04/16 176 lb 6.4 oz (80 kg)      Other studies Reviewed: Additional studies/  records that were reviewed today include: Lab work, notes, cath report  Echocardiogram 05/20/15: - Left ventricle: The cavity size was mildly dilated. Wall thickness was increased in a pattern of mild LVH. Systolicfunction was moderately reduced. The estimated ejection fractionwas in the range of 35% to 40%. - Aortic valve: There was mild regurgitation. - Mitral valve: MR is eccentric, directed along posterior wall ofleft atrium. Calcified annulus. Moderately thickened leaflets . There was mild regurgitation. - Left atrium: The atrium was severely dilated. - Pulmonary arteries: PA peak pressure: 56 mm Hg (S). Review of the above records demonstrates: As above   ASSESSMENT AND PLAN:  Chronic systolic heart failure/dilated cardiomyopathy/nonischemic cardiomyopathy  - Start Entresto 24/26 mg. He is on losartan 25 mg. Stop this. Watch for any signs of hypotension. This will also help with his ankle edema.  - I tried to encourage him to continue with the bisoprolol. He states that  when he takes it his ankle swell and he has some shortness of breath. He is also taking his Lasix 60 mg a day.  Secondary pulmonary hypertension  - Moderate, a result of left ventricular systolic dysfunction.  - Treat left-sided heart disease.  Mild to Moderate aortic regurgitation  - We will monitor clinically.  Right bundle branch block, first agree AV block, left anterior fascicular block  - Stable on EKG.  Dilated aortic root  - Continue to monitor  Signed, [name], MD  06/01/2016 5:12 PM    [facility] [address], [place], [place]  [address] Phone: [phone]; Fax: [phone]

## 2016-06-04 ENCOUNTER — Telehealth: Payer: Self-pay

## 2016-06-04 NOTE — Telephone Encounter (Signed)
Entresto 24-26 approved by [facility] Ref # [id number]. auth good for 1 year.

## 2016-06-10 ENCOUNTER — Ambulatory Visit (HOSPITAL_COMMUNITY): Payer: Medicare Other | Attending: Cardiology

## 2016-06-10 ENCOUNTER — Other Ambulatory Visit: Payer: Self-pay | Admitting: *Deleted

## 2016-06-10 ENCOUNTER — Other Ambulatory Visit: Payer: Self-pay | Admitting: Cardiology

## 2016-06-10 ENCOUNTER — Other Ambulatory Visit: Payer: Self-pay

## 2016-06-10 DIAGNOSIS — I493 Ventricular premature depolarization: Secondary | ICD-10-CM | POA: Diagnosis not present

## 2016-06-10 DIAGNOSIS — I5022 Chronic systolic (congestive) heart failure: Secondary | ICD-10-CM

## 2016-06-10 DIAGNOSIS — I110 Hypertensive heart disease with heart failure: Secondary | ICD-10-CM | POA: Insufficient documentation

## 2016-06-10 DIAGNOSIS — I4510 Unspecified right bundle-branch block: Secondary | ICD-10-CM | POA: Insufficient documentation

## 2016-06-10 DIAGNOSIS — I429 Cardiomyopathy, unspecified: Secondary | ICD-10-CM | POA: Diagnosis not present

## 2016-06-10 DIAGNOSIS — E785 Hyperlipidemia, unspecified: Secondary | ICD-10-CM | POA: Diagnosis not present

## 2016-06-12 ENCOUNTER — Telehealth: Payer: Self-pay | Admitting: Cardiology

## 2016-06-12 DIAGNOSIS — I10 Essential (primary) hypertension: Secondary | ICD-10-CM | POA: Diagnosis not present

## 2016-06-12 DIAGNOSIS — I5020 Unspecified systolic (congestive) heart failure: Secondary | ICD-10-CM | POA: Diagnosis not present

## 2016-06-12 DIAGNOSIS — Z1389 Encounter for screening for other disorder: Secondary | ICD-10-CM | POA: Diagnosis not present

## 2016-06-12 DIAGNOSIS — Z0000 Encounter for general adult medical examination without abnormal findings: Secondary | ICD-10-CM | POA: Diagnosis not present

## 2016-06-12 DIAGNOSIS — E119 Type 2 diabetes mellitus without complications: Secondary | ICD-10-CM | POA: Diagnosis not present

## 2016-06-12 DIAGNOSIS — Z7984 Long term (current) use of oral hypoglycemic drugs: Secondary | ICD-10-CM | POA: Diagnosis not present

## 2016-06-12 DIAGNOSIS — Z79899 Other long term (current) drug therapy: Secondary | ICD-10-CM | POA: Diagnosis not present

## 2016-06-12 NOTE — Telephone Encounter (Signed)
New Message    Pt wants  You to call with the number for ejection fracture from echo

## 2016-06-12 NOTE — Telephone Encounter (Signed)
Pt aware EF is 20-25% on echo.

## 2016-06-30 ENCOUNTER — Encounter (HOSPITAL_COMMUNITY): Payer: Self-pay | Admitting: *Deleted

## 2016-06-30 ENCOUNTER — Ambulatory Visit (HOSPITAL_COMMUNITY)
Admission: EM | Admit: 2016-06-30 | Discharge: 2016-06-30 | Disposition: A | Discharge status: Home / Self Care | Payer: Medicare Other | Attending: Family Medicine | Admitting: Family Medicine

## 2016-06-30 DIAGNOSIS — N489 Disorder of penis, unspecified: Secondary | ICD-10-CM | POA: Diagnosis not present

## 2016-06-30 DIAGNOSIS — R1909 Other intra-abdominal and pelvic swelling, mass and lump: Secondary | ICD-10-CM | POA: Diagnosis not present

## 2016-06-30 DIAGNOSIS — I429 Cardiomyopathy, unspecified: Secondary | ICD-10-CM | POA: Insufficient documentation

## 2016-06-30 DIAGNOSIS — I4510 Unspecified right bundle-branch block: Secondary | ICD-10-CM | POA: Insufficient documentation

## 2016-06-30 DIAGNOSIS — Z7984 Long term (current) use of oral hypoglycemic drugs: Secondary | ICD-10-CM | POA: Insufficient documentation

## 2016-06-30 DIAGNOSIS — R369 Urethral discharge, unspecified: Secondary | ICD-10-CM | POA: Insufficient documentation

## 2016-06-30 DIAGNOSIS — I351 Nonrheumatic aortic (valve) insufficiency: Secondary | ICD-10-CM | POA: Diagnosis not present

## 2016-06-30 DIAGNOSIS — E1122 Type 2 diabetes mellitus with diabetic chronic kidney disease: Secondary | ICD-10-CM | POA: Diagnosis not present

## 2016-06-30 DIAGNOSIS — I509 Heart failure, unspecified: Secondary | ICD-10-CM | POA: Insufficient documentation

## 2016-06-30 DIAGNOSIS — I272 Pulmonary hypertension, unspecified: Secondary | ICD-10-CM | POA: Diagnosis not present

## 2016-06-30 DIAGNOSIS — D696 Thrombocytopenia, unspecified: Secondary | ICD-10-CM | POA: Insufficient documentation

## 2016-06-30 DIAGNOSIS — E785 Hyperlipidemia, unspecified: Secondary | ICD-10-CM | POA: Insufficient documentation

## 2016-06-30 DIAGNOSIS — Z202 Contact with and (suspected) exposure to infections with a predominantly sexual mode of transmission: Secondary | ICD-10-CM

## 2016-06-30 DIAGNOSIS — R360 Urethral discharge without blood: Secondary | ICD-10-CM

## 2016-06-30 DIAGNOSIS — Z209 Contact with and (suspected) exposure to unspecified communicable disease: Secondary | ICD-10-CM | POA: Insufficient documentation

## 2016-06-30 DIAGNOSIS — N183 Chronic kidney disease, stage 3 (moderate): Secondary | ICD-10-CM | POA: Insufficient documentation

## 2016-06-30 DIAGNOSIS — I313 Pericardial effusion (noninflammatory): Secondary | ICD-10-CM | POA: Diagnosis not present

## 2016-06-30 DIAGNOSIS — I340 Nonrheumatic mitral (valve) insufficiency: Secondary | ICD-10-CM | POA: Insufficient documentation

## 2016-06-30 MED ORDER — NYSTATIN 100000 UNIT/GM EX CREA: TOPICAL_CREAM | CUTANEOUS | 0 refills | Status: DC

## 2016-06-30 MED ORDER — PENICILLIN G BENZATHINE 1200000 UNIT/2ML IM SUSP
2.4000 10*6.[IU] | Freq: Once | INTRAMUSCULAR | Status: AC
  Administered 2016-06-30: 2.4 10*6.[IU] via INTRAMUSCULAR

## 2016-06-30 MED ORDER — PENICILLIN G BENZATHINE 1200000 UNIT/2ML IM SUSP
INTRAMUSCULAR | Status: AC
  Filled 2016-06-30: qty 4

## 2016-06-30 NOTE — Discharge Instructions (Signed)
Please follow up in 2 days for follow up.  If lesion is not better may need referral to Urology

## 2016-06-30 NOTE — ED Provider Notes (Signed)
CSN: [id number]     Arrival date & time 06/30/16  1030 History   None    Chief Complaint  Patient presents with  . Groin Swelling   (Consider location/radiation/quality/duration/timing/severity/associated sxs/prior Treatment) Patient c/o penile swelling and irritation.  He states it was worse a week ago.  He has had unprotected risky sex recently and developed these sx's.  He states he went to a doctor who put him on flagyl for trich but did no testing.   The history is provided by the patient.  Penile Discharge  This is a new problem. The problem occurs constantly. The problem has not changed since onset.Nothing aggravates the symptoms.    Past Medical History:  Diagnosis Date  . Aortic regurgitation    a. mild AI by echo 01/2016.  [name] Aortic root dilation (HCC)   . Aortic valve regurgitation   . Bifascicular block   . Chronic systolic CHF (congestive heart failure) ([place])   . CKD (chronic kidney disease), stage III 01/01/2016  . Diabetes ([place])   . Ejection fraction < 50%   . Frequent PVCs   . Gilbert's syndrome   . Hyperlipidemia   . Mild diastolic dysfunction   . Mitral regurgitation    a. mod by echo 10/207.  [name] NICM (nonischemic cardiomyopathy) ([place])   . Pericardial effusion    a. small by echo 01/2016.  . Pulmonary hypertension   . Right BBB/left post fasc block   . Thrombocytopenia ([place])    Past Surgical History:  Procedure Laterality Date  . CARDIAC CATHETERIZATION N/A 12/20/2014   Procedure: Right/Left Heart Cath and Coronary Angiography;  Surgeon: Jerline Pain, MD;  Location: [facility];  Service: Cardiovascular;  Laterality: N/A;   Family History  Problem Relation Age of Onset  . Hypertension Mother   . Stroke Mother   . Irregular heart beat Father   . Unexplained death Father    Social History  Substance Use Topics  . Smoking status: Never Smoker  . Smokeless tobacco: Never Used  . Alcohol use No    Review of Systems  Constitutional:  Negative.   HENT: Negative.   Eyes: Negative.   Respiratory: Negative.   Cardiovascular: Negative.   Gastrointestinal: Negative.   Endocrine: Negative.   Genitourinary: Positive for discharge.  Allergic/Immunologic: Negative.   Neurological: Negative.   Hematological: Negative.   Psychiatric/Behavioral: Negative.     Allergies  Ace inhibitors; Coreg [carvedilol]; and Toprol xl [metoprolol tartrate]  Home Medications   Prior to Admission medications   Medication Sig Start Date End Date Taking? Authorizing Provider  alfuzosin (UROXATRAL) 10 MG 24 hr tablet Take 10 mg by mouth daily. 01/18/14   Historical Provider, MD  aspirin EC 81 MG tablet Take 81 mg by mouth daily.    Historical Provider, MD  bisoprolol (ZEBETA) 5 MG tablet Take as directed 02/11/16   Leanor Kail, PA  cholecalciferol (VITAMIN D) 1000 units tablet Take 1,000 Units by mouth every other day.    Historical Provider, MD  finasteride (PROSCAR) 5 MG tablet Take 5 mg by mouth daily.  02/08/14   Historical Provider, MD  furosemide (LASIX) 40 MG tablet Take 2 tablets (80 mg total) by mouth daily. 01/02/16   Dayna N Dunn, PA-C  glimepiride (AMARYL) 4 MG tablet Take one tablet in the mornings and one-half tablet at night by mouth 03/12/16   Historical Provider, MD  nystatin cream (MYCOSTATIN) Apply to affected area 2 times daily 06/30/16   Orson Ape  Keilynn Marano, FNP  potassium chloride SA (K-DUR,KLOR-CON) 10 MEQ tablet Take 4 tablets (40 mEq total) by mouth daily. 01/02/16   Dayna N Dunn, PA-C  sacubitril-valsartan (ENTRESTO) 24-26 MG Take 1 tablet by mouth 2 (two) times daily. 06/01/16   Jerline Pain, MD   Meds Ordered and Administered this Visit   Medications  penicillin g benzathine (BICILLIN LA) 1200000 UNIT/2ML injection 2.4 Million Units (2.4 Million Units Intramuscular Given 06/30/16 1133)    BP 130/70 (BP Location: Right Arm)   Pulse 78   Temp 98.6 F (37 C) (Oral)   Resp 18   SpO2 100%  No data  found.   Physical Exam  Constitutional: He is oriented to person, place, and time. He appears well-developed and well-nourished.  HENT:  Head: Normocephalic and atraumatic.  Eyes: Conjunctivae and EOM are normal. Pupils are equal, round, and reactive to light.  Neck: Normal range of motion. Neck supple.  Cardiovascular: Normal rate, regular rhythm and normal heart sounds.   Pulmonary/Chest: Effort normal and breath sounds normal.  Genitourinary: Penile tenderness present.  Genitourinary Comments: Penis with ulceration wound on glans and on the outer foreskin.  His glans has maceration and tenderness.  Neurological: He is alert and oriented to person, place, and time.  Nursing note and vitals reviewed.   Urgent Care Course     Procedures (including critical care time)  Labs Review Labs Reviewed  HSV CULTURE AND TYPING  RPR  HIV ANTIBODY (ROUTINE TESTING)  URINE CYTOLOGY ANCILLARY ONLY    Imaging Review No results found.   Visual Acuity Review  Right Eye Distance:   Left Eye Distance:   Bilateral Distance:    Right Eye Near:   Left Eye Near:    Bilateral Near:         MDM   1. Penile lesion   2. STD exposure    PCN G 2.4 million units IM  Cytology urine - GC/chlamydia Trich Herpes culture RPR HIV      Lysbeth Penner, FNP 06/30/16 1155

## 2016-06-30 NOTE — ED Triage Notes (Signed)
PT  REPORTS    SYMPTOMS    OF      PENILE  IRRITATION  AND  SWELLING   PT  WAS  SEEN  FOR  TRICHAMONIS  BY  HIS  PCP     AND  WAS  GIVEN  AN RX  FOR  SAME  PT  REPORTS    SWELLING  AND  CRUSTY  LESION ON THE  PENIS    -  PT     IS   AWAJE  ALERT   NO  DISTRESS   HE  STATES  HE  [place]

## 2016-07-01 LAB — RPR: RPR Ser Ql: NONREACTIVE

## 2016-07-01 LAB — HIV ANTIBODY (ROUTINE TESTING W REFLEX): HIV Screen 4th Generation wRfx: NONREACTIVE

## 2016-07-02 ENCOUNTER — Ambulatory Visit (INDEPENDENT_AMBULATORY_CARE_PROVIDER_SITE_OTHER): Payer: Medicare Other | Admitting: Physician Assistant

## 2016-07-02 ENCOUNTER — Encounter: Payer: Self-pay | Admitting: Physician Assistant

## 2016-07-02 VITALS — BP 120/70 | HR 50 | Ht 71.0 in | Wt 185.5 lb

## 2016-07-02 DIAGNOSIS — I428 Other cardiomyopathies: Secondary | ICD-10-CM | POA: Diagnosis not present

## 2016-07-02 DIAGNOSIS — I5023 Acute on chronic systolic (congestive) heart failure: Secondary | ICD-10-CM

## 2016-07-02 DIAGNOSIS — E118 Type 2 diabetes mellitus with unspecified complications: Secondary | ICD-10-CM

## 2016-07-02 DIAGNOSIS — I493 Ventricular premature depolarization: Secondary | ICD-10-CM

## 2016-07-02 DIAGNOSIS — I10 Essential (primary) hypertension: Secondary | ICD-10-CM | POA: Diagnosis not present

## 2016-07-02 DIAGNOSIS — E785 Hyperlipidemia, unspecified: Secondary | ICD-10-CM | POA: Diagnosis not present

## 2016-07-02 DIAGNOSIS — N189 Chronic kidney disease, unspecified: Secondary | ICD-10-CM

## 2016-07-02 LAB — URINE CYTOLOGY ANCILLARY ONLY
Chlamydia: NEGATIVE
Neisseria Gonorrhea: NEGATIVE
Trichomonas: NEGATIVE

## 2016-07-02 LAB — HSV CULTURE AND TYPING

## 2016-07-02 MED ORDER — BISOPROLOL FUMARATE 5 MG PO TABS: 5.0000 mg | ORAL_TABLET | Freq: Every day | ORAL | 3 refills | Status: DC

## 2016-07-02 NOTE — Patient Instructions (Addendum)
Medication Instructions:  Your physician recommends that you continue on your current medications as directed. Please refer to the Current Medication list given to you today.   Labwork: TODAY:  BMET  Testing/Procedures: Your physician has requested that you have an echocardiogram AT THE END OF MAY.  Echocardiography is a painless test that uses sound waves to create images of your heart. It provides your doctor with information about the size and shape of your heart and how well your heart's chambers and valves are working. This procedure takes approximately one hour. There are no restrictions for this procedure.   Follow-Up: Your physician recommends that you schedule a follow-up appointment in: [place] ECHOCARDIOGRAM WITH DR. Marlou Porch OR Nell Range, PA-C   Any Other Special Instructions Will Be Listed Below (If Applicable).  Echocardiogram An echocardiogram, or echocardiography, uses sound waves (ultrasound) to produce an image of your heart. The echocardiogram is simple, painless, obtained within a short period of time, and offers valuable information to your health care provider. The images from an echocardiogram can provide information such as:  Evidence of coronary artery disease (CAD).  Heart size.  Heart muscle function.  Heart valve function.  Aneurysm detection.  Evidence of a past heart attack.  Fluid buildup around the heart.  Heart muscle thickening.  Assess heart valve function. Tell a health care provider about:  Any allergies you have.  All medicines you are taking, including vitamins, herbs, eye drops, creams, and over-the-counter medicines.  Any problems you or family members have had with anesthetic medicines.  Any blood disorders you have.  Any surgeries you have had.  Any medical conditions you have.  Whether you are pregnant or may be pregnant. What happens before the procedure? No special preparation is needed. Eat and drink  normally. What happens during the procedure?  In order to produce an image of your heart, gel will be applied to your chest and a wand-like tool (transducer) will be moved over your chest. The gel will help transmit the sound waves from the transducer. The sound waves will harmlessly bounce off your heart to allow the heart images to be captured in real-time motion. These images will then be recorded.  You may need an IV to receive a medicine that improves the quality of the pictures. What happens after the procedure? You may return to your normal schedule including diet, activities, and medicines, unless your health care provider tells you otherwise. This information is not intended to replace advice given to you by your health care provider. Make sure you discuss any questions you have with your health care provider. Document Released: 03/20/2000 Document Revised: 11/09/2015 Document Reviewed: 11/28/2012 Elsevier Interactive Patient Education  2017 [employer].    If you need a refill on your cardiac medications before your next appointment, please call your pharmacy.

## 2016-07-02 NOTE — Addendum Note (Signed)
Addended by: Gaetano Net on: 07/02/2016 03:24 PM   Modules accepted: Orders

## 2016-07-02 NOTE — Progress Notes (Signed)
Cardiology Office Note    Date:  07/02/2016   ID:  Carlos Gibson, DOB 02/26/1941, MRN [id number]  PCP:  [name], MD  Cardiologist:  Dr. Marlou Porch / Dr. Curt Bears (EP)  CC: follow up after started Delene Loll  History of Present Illness:  Carlos Gibson is a 76 y.o. male with a history of NICM/chronic systolic CHF ([place] 07/979 with normal cors), intolerance to prior Toprol/carvedilol due to "feeling bad," secondary pulmonary hypertension, frequent PVCs, aortic root dilatation, Trifasicular block (RBBB + LAFB + 1st degree AVB), diabetes, Gilbert's syndrome, mild AI/MR by echo 05/2015, hyperlipidemia, thrombocytopenia, probable CKD stage III who presents to clinic for follow up.   Admitted 12/2015 for acute on chronic systolic CHF with secondary pulmonary hypertension. Good result with IV diuresis.   2D ECHO 01/15/16 showed EF 20-35%. Also had high burden of PVCs on 48 hours monitor (~7%). The patient was evaluated by Dr. Curt Bears for ICD consideration and PVC ablation in clinic 02/04/16. He was started on bisoprolol and plan for repeat echo in few months and reevaluate at that time. PVC burden not felt to be high enough to cause LV dysfunction. He did not end up taking bisoprolol given intolerance (SOB and LE edema). Repeat limited echo on 06/11/16 showed EF remained low at 20-25%. He was last seen by Dr. Marlou Porch in the office on 06/01/16 and started on Entresto. He was told to stop Losartan and encouraged to try to take Bisoprolol.   Today he presents to clinic for follow up. He has been feeling much better on the Entresto. He can walk up stairs with more ease in terms of SOB. He has been able to tolerate bisoprolol 2.76m mg daily. Takes lasix 40mg  daily with extra 20-40mg  based on symptoms and weight. No orthopnea or PND. No dizziness or syncope. No blood in stool or urine. No other complaints. He works [employer] a couple times a month. He enjoys this. His brother in [place] just got a  defibrillator. He would like to avoid this. Hoping his heart will get stronger.     Past Medical History:  Diagnosis Date  . Aortic regurgitation    a. mild AI by echo 01/2016.  [name] Aortic root dilation (HCC)   . Aortic valve regurgitation   . Bifascicular block   . Chronic systolic CHF (congestive heart failure) ([place])   . CKD (chronic kidney disease), stage III 01/01/2016  . Diabetes ([place])   . Ejection fraction < 50%   . Frequent PVCs   . Gilbert's syndrome   . Hyperlipidemia   . Mild diastolic dysfunction   . Mitral regurgitation    a. mod by echo 10/207.  [name] NICM (nonischemic cardiomyopathy) ([place])   . Pericardial effusion    a. small by echo 01/2016.  . Pulmonary hypertension   . Right BBB/left post fasc block   . Thrombocytopenia ([place])     Past Surgical History:  Procedure Laterality Date  . CARDIAC CATHETERIZATION N/A 12/20/2014   Procedure: Right/Left Heart Cath and Coronary Angiography;  Surgeon: Jerline Pain, MD;  Location: [facility];  Service: Cardiovascular;  Laterality: N/A;    Current Medications: Outpatient Medications Prior to Visit  Medication Sig Dispense Refill  . alfuzosin (UROXATRAL) 10 MG 24 hr tablet Take 10 mg by mouth daily.  3  . aspirin EC 81 MG tablet Take 81 mg by mouth daily.    . bisoprolol (ZEBETA) 5 MG tablet Take as directed 45 tablet 2  .  cholecalciferol (VITAMIN D) 1000 units tablet Take 1,000 Units by mouth every other day.    . finasteride (PROSCAR) 5 MG tablet Take 5 mg by mouth daily.   2  . furosemide (LASIX) 40 MG tablet Take 2 tablets (80 mg total) by mouth daily. 60 tablet 2  . glimepiride (AMARYL) 4 MG tablet Take one tablet in the mornings and one-half tablet at night by mouth    . nystatin cream (MYCOSTATIN) Apply to affected area 2 times daily 30 g 0  . potassium chloride SA (K-DUR,KLOR-CON) 10 MEQ tablet Take 4 tablets (40 mEq total) by mouth daily. 120 tablet 2  . sacubitril-valsartan (ENTRESTO) 24-26 MG Take 1 tablet  by mouth 2 (two) times daily. 60 tablet 3   No facility-administered medications prior to visit.      Allergies:   Ace inhibitors; Coreg [carvedilol]; and Toprol xl [metoprolol tartrate]   Social History   Social History  . Marital status: Married    Spouse name: N/A  . Number of children: N/A  . Years of education: N/A   Social History Main Topics  . Smoking status: Never Smoker  . Smokeless tobacco: Never Used  . Alcohol use No  . Drug use: No  . Sexual activity: Not Asked   Other Topics Concern  . None   Social History Narrative  . None     Family History:  The patient's family history includes Hypertension in his mother; Irregular heart beat in his father; Stroke in his mother; Unexplained death in his father.     ROS:   Please see the history of present illness.    ROS All other systems reviewed and are negative.   PHYSICAL EXAM:   VS:  BP 120/70   Pulse (!) 50   Ht 5\' 11"  (1.803 m)   Wt 185 lb 8 oz (84.1 kg)   SpO2 93%   BMI 25.87 kg/m    GEN: Well nourished, well developed, in no acute distress  HEENT: normal  Neck: no JVD, carotid bruits, or masses Cardiac: RRR; no murmurs, rubs, or gallops, 1 + LE edema  Respiratory:  clear to auscultation bilaterally, normal work of breathing GI: soft, nontender, nondistended, + BS MS: no deformity or atrophy  Skin: warm and dry, no rash Neuro:  Alert and Oriented x 3, Strength and sensation are intact Psych: euthymic mood, full affect     Wt Readings from Last 3 Encounters:  07/02/16 185 lb 8 oz (84.1 kg)  06/01/16 188 lb 3.2 oz (85.4 kg)  02/11/16 174 lb 12.8 oz (79.3 kg)      Studies/Labs Reviewed:   EKG:  EKG is NOT ordered today.   Recent Labs: 12/31/2015: ALT 53; B Natriuretic Peptide 1,865.4; Hemoglobin 13.1; Platelets 145 01/08/2016: BUN 22; Creat 1.58; Potassium 4.8; Sodium 139   Lipid Panel No results found for: CHOL, TRIG, HDL, CHOLHDL, VLDL, LDLCALC, LDLDIRECT  Additional studies/ records  that were reviewed today include:  Right/Left Heart Cath and Coronary Angiography  12/20/14  Conclusion    There is severe left ventricular systolic dysfunction.  Ejection fraction between 25-30% with global hypokinesis  Moderately elevated pulmonary pressures-mean PA pressure 35 mmHg consistent with secondary pulmonary hypertension as a result of increased left ventricular end-diastolic pressure  No angiographically significant coronary artery disease present.  Nonischemic cardiomyopathy. Left ventricular ejection fraction 25-30%. Left ventricular end-diastolic pressure 24 mmHg. Cardiac output 3.7 L/m with cardiac index of 1.8.  We'll continue with aggressive medical management. Up  titration of medications     Limited TTE: 06/10/2016 LV EF: 20% -   25% Study Conclusions - Left ventricle: The cavity size was mildly dilated. There was   moderate concentric hypertrophy. Systolic function was severely   reduced. The estimated ejection fraction was in the range of 20%   to 25%. Diffuse hypokinesis. - Left atrium: The atrium was severely dilated. - Right ventricle: Systolic function was severely reduced. - Right atrium: The atrium was moderately dilated. - Inferior vena cava: The vessel was normal in size. - Pericardium, extracardiac: A trivial pericardial effusion was   identified posterior to the heart. Features were not consistent   with tamponade physiology. Impressions: - Severe biventricular dysfunction.   This was a limited study without use of color Doppler.   2D ECHO: 01/15/2016 LV EF: 25% -   30% Study Conclusions - Left ventricle: The cavity size was severely dilated. Wall   thickness was increased in a pattern of mild LVH. Systolic   function was severely reduced. The estimated ejection fraction   was in the range of 25% to 30%. Diffuse hypokinesis. The study is   not technically sufficient to allow evaluation of LV diastolic   function. - Aortic valve: There  was mild regurgitation. - Mitral valve: There was moderate regurgitation. - Right ventricle: The cavity size was mildly dilated. - Right atrium: The atrium was mildly dilated. - Atrial septum: No defect or patent foramen ovale was identified. - Pulmonary arteries: PA peak pressure: 47 mm Hg (S). - Pericardium, extracardiac: Small posterior lateral pericardial   effusion  48 hour holter monitor 01/2016  17000 PVC's - frequent, occasional couplets, rare triplets.  1500 PAC's    ASSESSMENT & PLAN:   Chronic systolic CHF with secondary pulm HTN: EF 20-25%. He is doing much better since starting the Entresto 20-26mg . Will check a BMET today.  He has been able to tolerate bisoprolol 2.30m mg daily. I have asked him to try to take a whole tablet (5mg ). He takes lasix 40mg  daily with extra 20-40mg  based on symptoms and weight. He appears euvolemic today with some mild LE edema. Will plan to check an echo in 2 months to reassess LV function. Hopefully, it will be >35% now that he is on good medical therapy and will not need a defibrillator  Trifascicular block: stable.   CKD stage III: creat 1.58 in 01/2016. Will check BMET on Entresto  PVCs: cont BB   DMT2: continue current regimen   HTN: BP well controlled currently.    Medication Adjustments/Labs and Tests Ordered: Current medicines are reviewed at length with the patient today.  Concerns regarding medicines are outlined above.  Medication changes, Labs and Tests ordered today are listed in the Patient Instructions below. Patient Instructions  Medication Instructions:  Your physician recommends that you continue on your current medications as directed. Please refer to the Current Medication list given to you today.   Labwork: TODAY:  BMET  Testing/Procedures: Your physician has requested that you have an echocardiogram AT THE END OF MAY.  Echocardiography is a painless test that uses sound waves to create images of your heart. It  provides your doctor with information about the size and shape of your heart and how well your heart's chambers and valves are working. This procedure takes approximately one hour. There are no restrictions for this procedure.   Follow-Up: Your physician recommends that you schedule a follow-up appointment in: [place] ECHOCARDIOGRAM WITH DR. Marlou Porch OR  KATIE Azora Bonzo, PA-C   Any Other Special Instructions Will Be Listed Below (If Applicable).  Echocardiogram An echocardiogram, or echocardiography, uses sound waves (ultrasound) to produce an image of your heart. The echocardiogram is simple, painless, obtained within a short period of time, and offers valuable information to your health care provider. The images from an echocardiogram can provide information such as:  Evidence of coronary artery disease (CAD).  Heart size.  Heart muscle function.  Heart valve function.  Aneurysm detection.  Evidence of a past heart attack.  Fluid buildup around the heart.  Heart muscle thickening.  Assess heart valve function. Tell a health care provider about:  Any allergies you have.  All medicines you are taking, including vitamins, herbs, eye drops, creams, and over-the-counter medicines.  Any problems you or family members have had with anesthetic medicines.  Any blood disorders you have.  Any surgeries you have had.  Any medical conditions you have.  Whether you are pregnant or may be pregnant. What happens before the procedure? No special preparation is needed. Eat and drink normally. What happens during the procedure?  In order to produce an image of your heart, gel will be applied to your chest and a wand-like tool (transducer) will be moved over your chest. The gel will help transmit the sound waves from the transducer. The sound waves will harmlessly bounce off your heart to allow the heart images to be captured in real-time motion. These images will then be  recorded.  You may need an IV to receive a medicine that improves the quality of the pictures. What happens after the procedure? You may return to your normal schedule including diet, activities, and medicines, unless your health care provider tells you otherwise. This information is not intended to replace advice given to you by your health care provider. Make sure you discuss any questions you have with your health care provider. Document Released: 03/20/2000 Document Revised: 11/09/2015 Document Reviewed: 11/28/2012 Elsevier Interactive Patient Education  2017 [employer].    If you need a refill on your cardiac medications before your next appointment, please call your pharmacy.      Signed, Angelena Form, PA-C  07/02/2016 3:18 PM    [facility] [address], [place], [place]  [address] Phone: [phone]; Fax: [phone]

## 2016-07-03 LAB — BASIC METABOLIC PANEL
BUN / CREAT RATIO: 9 — AB (ref 10–24)
BUN: 15 mg/dL (ref 8–27)
CHLORIDE: 100 mmol/L (ref 96–106)
CO2: 28 mmol/L (ref 18–29)
Calcium: 9.4 mg/dL (ref 8.6–10.2)
Creatinine, Ser: 1.58 mg/dL — ABNORMAL HIGH (ref 0.76–1.27)
GFR calc Af Amer: 49 mL/min/{1.73_m2} — ABNORMAL LOW (ref >59)
GFR calc non Af Amer: 42 mL/min/{1.73_m2} — ABNORMAL LOW (ref >59)
GLUCOSE: 129 mg/dL — AB (ref 65–99)
POTASSIUM: 3.9 mmol/L (ref 3.5–5.2)
Sodium: 142 mmol/L (ref 134–144)

## 2016-07-29 ENCOUNTER — Other Ambulatory Visit: Payer: Self-pay | Admitting: Cardiology

## 2016-07-31 ENCOUNTER — Telehealth: Payer: Self-pay | Admitting: Cardiology

## 2016-07-31 MED ORDER — BISOPROLOL FUMARATE 5 MG PO TABS: 5.0000 mg | ORAL_TABLET | Freq: Every day | ORAL | 3 refills | Status: DC

## 2016-07-31 NOTE — Telephone Encounter (Signed)
Spoke with pt and advised him that when he seen Bonney Leitz, PA-C she had asked him to try increasing Bisoprolol to a whole tablet.  Pt states he did this and has not had any problems on increased dose.  Advised pt I would send in prescription with updated dose.  Pt appreciative for call.

## 2016-07-31 NOTE — Telephone Encounter (Signed)
Patient calling, states that about 2 weeks ago he was instructed to take 1 whole tablet of bisoprolol but the prescription bottles states "take 1/2 pill." Patient would like to verify if he should take a whole pill or half pill of bisoprolol. Thanks.

## 2016-08-07 ENCOUNTER — Telehealth: Payer: Self-pay | Admitting: *Deleted

## 2016-08-07 NOTE — Telephone Encounter (Signed)
Patients wife came to the office and dropped off patient assistance forms for entresto for the patient. She requested samples. Samples provided and paperwork given to [place].

## 2016-09-01 ENCOUNTER — Encounter (INDEPENDENT_AMBULATORY_CARE_PROVIDER_SITE_OTHER): Payer: Self-pay

## 2016-09-01 ENCOUNTER — Ambulatory Visit (HOSPITAL_COMMUNITY): Payer: Medicare Other | Attending: Cardiovascular Disease

## 2016-09-01 ENCOUNTER — Telehealth: Payer: Self-pay

## 2016-09-01 ENCOUNTER — Other Ambulatory Visit: Payer: Self-pay

## 2016-09-01 DIAGNOSIS — I083 Combined rheumatic disorders of mitral, aortic and tricuspid valves: Secondary | ICD-10-CM | POA: Diagnosis not present

## 2016-09-01 DIAGNOSIS — I4510 Unspecified right bundle-branch block: Secondary | ICD-10-CM | POA: Diagnosis not present

## 2016-09-01 DIAGNOSIS — I428 Other cardiomyopathies: Secondary | ICD-10-CM | POA: Diagnosis not present

## 2016-09-01 DIAGNOSIS — E1122 Type 2 diabetes mellitus with diabetic chronic kidney disease: Secondary | ICD-10-CM | POA: Diagnosis not present

## 2016-09-01 DIAGNOSIS — I5021 Acute systolic (congestive) heart failure: Secondary | ICD-10-CM | POA: Diagnosis present

## 2016-09-01 DIAGNOSIS — I270 Primary pulmonary hypertension: Secondary | ICD-10-CM | POA: Diagnosis not present

## 2016-09-01 DIAGNOSIS — E785 Hyperlipidemia, unspecified: Secondary | ICD-10-CM | POA: Diagnosis not present

## 2016-09-01 DIAGNOSIS — Z8249 Family history of ischemic heart disease and other diseases of the circulatory system: Secondary | ICD-10-CM | POA: Diagnosis not present

## 2016-09-01 DIAGNOSIS — I5023 Acute on chronic systolic (congestive) heart failure: Secondary | ICD-10-CM | POA: Insufficient documentation

## 2016-09-01 DIAGNOSIS — I088 Other rheumatic multiple valve diseases: Secondary | ICD-10-CM | POA: Insufficient documentation

## 2016-09-01 DIAGNOSIS — I313 Pericardial effusion (noninflammatory): Secondary | ICD-10-CM | POA: Insufficient documentation

## 2016-09-01 DIAGNOSIS — I493 Ventricular premature depolarization: Secondary | ICD-10-CM | POA: Diagnosis not present

## 2016-09-01 DIAGNOSIS — N189 Chronic kidney disease, unspecified: Secondary | ICD-10-CM | POA: Diagnosis not present

## 2016-09-01 NOTE — Telephone Encounter (Signed)
The pt was here having an Echo. While here he requested samples of Entresto as he stated that it is costing him $500 a month.  I called [facility], his pharmacy, to get information off the insurance card they have on file for the pt. I was advised that a PA or tier exception is not needed at this time and that the pt needs to meet his deductible and the cost of his Delene Loll will cost less.  I called the pt and made him aware. I recommended that he call his insurance company with his question of how much Delene Loll will cost him after his deductible is met as the pharmacy could not tell me the amount.  He verbalized understanding and thanked me for my help.

## 2016-09-01 NOTE — Telephone Encounter (Signed)
Patient here for echo. Pt stated he has not taken Entresto in 3 days because he can't afford it and his insurance is not covering it. Pt said it's $500 for a 30-day supply and that it is too expensive. Prior-auth nurse is aware and is working on getting pt's Entresto aware. Samples given to pt. Pt states since starting his Entresto, his SOB has improved.

## 2016-09-07 ENCOUNTER — Telehealth: Payer: Self-pay | Admitting: Cardiology

## 2016-09-07 ENCOUNTER — Encounter: Payer: Self-pay | Admitting: Cardiology

## 2016-09-07 ENCOUNTER — Ambulatory Visit (INDEPENDENT_AMBULATORY_CARE_PROVIDER_SITE_OTHER): Payer: Medicare Other | Admitting: Cardiology

## 2016-09-07 VITALS — BP 140/82 | HR 80 | Ht 71.0 in | Wt 177.4 lb

## 2016-09-07 DIAGNOSIS — I10 Essential (primary) hypertension: Secondary | ICD-10-CM | POA: Diagnosis not present

## 2016-09-07 DIAGNOSIS — I5022 Chronic systolic (congestive) heart failure: Secondary | ICD-10-CM

## 2016-09-07 DIAGNOSIS — I77810 Thoracic aortic ectasia: Secondary | ICD-10-CM

## 2016-09-07 DIAGNOSIS — I420 Dilated cardiomyopathy: Secondary | ICD-10-CM | POA: Diagnosis not present

## 2016-09-07 NOTE — Patient Instructions (Signed)
Medication Instructions:  The current medical regimen is effective;  continue present plan and medications.  Follow-Up: Follow up in 4 months with Nell Range, PA.  You will receive a letter in the mail 2 months before you are due.  Please call us when you receive this letter to schedule your follow up appointment.  If you need a refill on your cardiac medications before your next appointment, please call your pharmacy.  Thank you for choosing [facility]!!

## 2016-09-07 NOTE — Progress Notes (Signed)
Date:  09/07/2016   ID:  Carlos Gibson, DOB 06/16/1940, MRN [id number]  PCP:  [name], MD  Cardiologist:   [name], MD     History of Present Illness: Carlos Gibson is a 76 y.o. male with nonischemic cardiomyopathy, dilated cardiomyopathy ejection fraction 25-35% range here for follow-up.  Had cardiac catheterization which showed normal coronary arteries, nonischemic cardiomyopathy, ejection fraction in the 25% range. His left ventricular end-diastolic pressure was also elevated in the mid 20s. Pulmonary pressures were moderately elevated.  On original presentation, his ejection fraction was found to be 40% in July 2016. 8 pounds fluid gain was noted. BNP was 1410. He had improvements after Lasix administration according to Dr. Felipa Eth previously. Lisinopril was tried but ACE inhibitor cough was produced.  Back in 2014 his ejection fraction was low normal at 50-55%, aortic root was 4 cm. Has a history of PVCs. Previously worked at [employer].  He was taking 60 mg of Lasix daily which is an increase from his 40 mg without any significant relief. He denies eating excessive salt. He also denies any excessive fluid intake.  He states that he has not tolerated carvedilol, Toprol and every time he takes bisoprolol his ankles swell and he feels short of breath. I encouraged him to continue to try the bisoprolol.  We also started Entresto. Cost was quite high.  Right now, he states that he is not having any shortness of breath. His weight is up since November. Ankle edema.  No chest pain, no syncope, no bleeding. He is having some worsening orthopnea.  Past Medical History:  Diagnosis Date  . Aortic regurgitation    a. mild AI by echo 01/2016.  [name] Aortic root dilation (HCC)   . Aortic valve regurgitation   . Bifascicular block   . Chronic systolic CHF (congestive heart failure) ([place])   . CKD (chronic kidney disease), stage III 01/01/2016  . Diabetes ([place])   . Ejection  fraction < 50%   . Frequent PVCs   . Gilbert's syndrome   . Hyperlipidemia   . Mild diastolic dysfunction   . Mitral regurgitation    a. mod by echo 10/207.  [name] NICM (nonischemic cardiomyopathy) ([place])   . Pericardial effusion    a. small by echo 01/2016.  . Pulmonary hypertension ([place])   . Right BBB/left post fasc block   . Thrombocytopenia ([place])     Past Surgical History:  Procedure Laterality Date  . CARDIAC CATHETERIZATION N/A 12/20/2014   Procedure: Right/Left Heart Cath and Coronary Angiography;  Surgeon: Jerline Pain, MD;  Location: [facility];  Service: Cardiovascular;  Laterality: N/A;     Current Outpatient Prescriptions  Medication Sig Dispense Refill  . alfuzosin (UROXATRAL) 10 MG 24 hr tablet Take 10 mg by mouth daily.  3  . aspirin EC 81 MG tablet Take 81 mg by mouth daily.    . bisoprolol (ZEBETA) 5 MG tablet Take 1 tablet (5 mg total) by mouth daily. 90 tablet 3  . cholecalciferol (VITAMIN D) 1000 units tablet Take 1,000 Units by mouth every other day.    . finasteride (PROSCAR) 5 MG tablet Take 5 mg by mouth daily.   2  . furosemide (LASIX) 40 MG tablet TAKE 2 TABLETS(80 MG) BY MOUTH DAILY 60 tablet 11  . glimepiride (AMARYL) 4 MG tablet Take one tablet in the mornings and one-half tablet at night by mouth    . nystatin cream (MYCOSTATIN) Apply to  affected area 2 times daily 30 g 0  . potassium chloride SA (K-DUR,KLOR-CON) 10 MEQ tablet Take 4 tablets (40 mEq total) by mouth daily. 120 tablet 2  . sacubitril-valsartan (ENTRESTO) 24-26 MG Take 1 tablet by mouth 2 (two) times daily. 60 tablet 3   No current facility-administered medications for this visit.     Allergies:   Ace inhibitors; Coreg [carvedilol]; and Toprol xl [metoprolol tartrate] cough   Social History:  The patient  reports that he has never smoked. He has never used smokeless tobacco. He reports that he does not drink alcohol or use drugs.   Family History:  The patient's family history  includes Hypertension in his mother; Irregular heart beat in his father; Stroke in his mother; Unexplained death in his father.    ROS:  Please see the history of present illness.   Otherwise, review of systems are positive for none.   All other systems are reviewed and negative.    PHYSICAL EXAM: VS:  BP 140/82   Pulse 80   Ht 5\' 11"  (1.803 m)   Wt 177 lb 6.4 oz (80.5 kg)   BMI 24.74 kg/m  , BMI Body mass index is 24.74 kg/m. GEN: Well nourished, well developed, in no acute distress  HEENT: normal  Neck: no JVD, carotid bruits, or masses Cardiac: RRR; no murmurs, rubs, + S3 gallops, no edema  Respiratory:  clear to auscultation bilaterally, normal work of breathing GI: soft, nontender, nondistended, + BS MS: no deformity or atrophy  Skin: warm and dry, no rash, bilateral ankle edema noted 2+ Neuro:  Strength and sensation are intact Psych: euthymic mood, full affect   EKG:  12/31/15-sinus rhythm with first-degree AV block PR interval 220 ms, right bundle branch block heart rate 90 with left anterior fascicular block, bifascicular block.  CATH: 12/20/14  There is severe left ventricular systolic dysfunction.  Ejection fraction between 25-30% with global hypokinesis  Moderately elevated pulmonary pressures-mean PA pressure 35 mmHg consistent with secondary pulmonary hypertension as a result of increased left ventricular end-diastolic pressure  No angiographically significant coronary artery disease present.  Nonischemic cardiomyopathy. Left ventricular ejection fraction 25-30%. Left ventricular end-diastolic pressure 24 mmHg. Cardiac output 3.7 L/m with cardiac index of 1.8.   Recent Labs: 12/31/2015: ALT 53; B Natriuretic Peptide 1,865.4; Hemoglobin 13.1; Platelets 145 07/02/2016: BUN 15; Creatinine, Ser 1.58; Potassium 3.9; Sodium 142    Lipid Panel No results found for: CHOL, TRIG, HDL, CHOLHDL, VLDL, LDLCALC, LDLDIRECT    Wt Readings from Last 3 Encounters:    09/07/16 177 lb 6.4 oz (80.5 kg)  07/02/16 185 lb 8 oz (84.1 kg)  06/01/16 188 lb 3.2 oz (85.4 kg)      Other studies Reviewed: Additional studies/ records that were reviewed today include: Lab work, notes, cath report  Echocardiogram 05/20/15: - Left ventricle: The cavity size was mildly dilated. Wall thickness was increased in a pattern of mild LVH. Systolicfunction was moderately reduced. The estimated ejection fractionwas in the range of 35% to 40%. - Aortic valve: There was mild regurgitation. - Mitral valve: MR is eccentric, directed along posterior wall ofleft atrium. Calcified annulus. Moderately thickened leaflets . There was mild regurgitation. - Left atrium: The atrium was severely dilated. - Pulmonary arteries: PA peak pressure: 56 mm Hg (S). Review of the above records demonstrates: As above   ASSESSMENT AND PLAN:  Chronic systolic heart failure/dilated cardiomyopathy/nonischemic cardiomyopathy  - Entresto 24/26 mg. $$$ We are giving him samples. Watch for any  signs of hypotension. This will also help with his ankle edema. He is doing very well. Current dry weight is 177. He occasionally will take an extra Lasix if necessary. Normally he takes 60 mg a day but will take 80 if necessary.  - EF 35%  Secondary pulmonary hypertension  - Moderate, a result of left ventricular systolic dysfunction.  - Treat left-sided heart disease. Stable.  Mild to Moderate aortic regurgitation  - We will monitor clinically. Doing well.  Right bundle branch block, first agree AV block, left anterior fascicular block  - Stable on EKG. no changes, no syncope.  Dilated aortic root  - Continue to monitor, overall stable.  Chronic kidney disease stage III  - Creatinine in the 1.5-1.6 range.  - Stable.  We will have him see APP in 4 months.  Signed, [name], MD  09/07/2016 9:58 AM    [facility] [address], [place], [place]  [address] Phone: [phone]; Fax: [phone]

## 2016-09-07 NOTE — Telephone Encounter (Signed)
Patient returning your call.

## 2016-09-07 NOTE — Telephone Encounter (Signed)
-----   Message from Eileen Stanford, [place] sent at 09/01/2016  2:04 PM EDT ----- His EF has now improved to 40-45% so he will not require an ICD! He has multiple valvular abnormalities in the moderate range (mod AR, mod TR, mod PR), G2DD and severely elevated pulmonary pressures. He can discuss these findings with Dr. Marlou Porch at visit next week.

## 2016-10-05 ENCOUNTER — Telehealth: Payer: Self-pay | Admitting: *Deleted

## 2016-10-05 NOTE — Telephone Encounter (Signed)
Patient called the office to request entresto samples. I made him aware that samples are intended for new starts. Patient stated that he is unable to pick up the medication at the pharmacy at this time as he cannot afford it. He has to meet his deductible of $400 and then the entresto will cost less. He is aware that I can provide him with two weeks but we are unable to do this on an ongoing basis. He informed me that he is trying to come up with the money to get it from the pharmacy. Patient verbalized understanding and was appreciative.

## 2016-10-29 ENCOUNTER — Telehealth: Payer: Self-pay | Admitting: Cardiology

## 2016-10-29 NOTE — Telephone Encounter (Signed)
Pt states he can not afford Entresto at this time.  States 6 days worth was going to cost him $47.  He wants to know if he can just be switched to another medication?  Pt hasn't had Entresto in 1.5 days.  Advised I would place samples at the front for pick up so he can continue medication until we hear back from Dr. Marlou Porch.  Pt appreciative for call.

## 2016-10-29 NOTE — Telephone Encounter (Signed)
New Message     Pt c/o medication issue:  1. Name of Medication: entresto   2. How are you currently taking this medication (dosage and times per day)?  2x a day  3. Are you having a reaction (difficulty breathing--STAT)? no  4. What is your medication issue?  Can not afford it can you give him something else

## 2016-10-30 NOTE — Telephone Encounter (Signed)
Megan, any options for him from financial aspect? Can not afford Delene Loll

## 2016-10-30 NOTE — Telephone Encounter (Signed)
Would advise pt to call Entresto financial assistance to see if he qualifies for any patient assistance [phone]. If he does not, would likely need to switch back to ACEi or ARB therapy.

## 2016-10-31 NOTE — Telephone Encounter (Signed)
Per [employer], pharm D. Would advise pt to call Entresto financial assistance to see if he qualifies for any patient assistance [phone]. If he does not, would likely need to switch back to ACEi or ARB therapy. [name], MD

## 2016-11-02 NOTE — Telephone Encounter (Signed)
Pt should qualify for [employer] (which is currently funded for HF) [phone]. Spoke with patient and gave him information to call to get set up. He states appreciation and understanding to call if still unable to obtain medication.

## 2016-11-06 DIAGNOSIS — M7021 Olecranon bursitis, right elbow: Secondary | ICD-10-CM | POA: Diagnosis not present

## 2016-11-06 DIAGNOSIS — Z7984 Long term (current) use of oral hypoglycemic drugs: Secondary | ICD-10-CM | POA: Diagnosis not present

## 2016-11-06 DIAGNOSIS — E119 Type 2 diabetes mellitus without complications: Secondary | ICD-10-CM | POA: Diagnosis not present

## 2016-12-03 DIAGNOSIS — N400 Enlarged prostate without lower urinary tract symptoms: Secondary | ICD-10-CM | POA: Diagnosis not present

## 2016-12-03 DIAGNOSIS — I5020 Unspecified systolic (congestive) heart failure: Secondary | ICD-10-CM | POA: Diagnosis not present

## 2016-12-03 DIAGNOSIS — E119 Type 2 diabetes mellitus without complications: Secondary | ICD-10-CM | POA: Diagnosis not present

## 2016-12-03 DIAGNOSIS — I10 Essential (primary) hypertension: Secondary | ICD-10-CM | POA: Diagnosis not present

## 2016-12-04 DIAGNOSIS — I10 Essential (primary) hypertension: Secondary | ICD-10-CM | POA: Diagnosis not present

## 2016-12-04 DIAGNOSIS — M7021 Olecranon bursitis, right elbow: Secondary | ICD-10-CM | POA: Diagnosis not present

## 2016-12-04 DIAGNOSIS — E119 Type 2 diabetes mellitus without complications: Secondary | ICD-10-CM | POA: Diagnosis not present

## 2016-12-04 DIAGNOSIS — D696 Thrombocytopenia, unspecified: Secondary | ICD-10-CM | POA: Diagnosis not present

## 2016-12-04 DIAGNOSIS — Z7984 Long term (current) use of oral hypoglycemic drugs: Secondary | ICD-10-CM | POA: Diagnosis not present

## 2016-12-04 DIAGNOSIS — I5020 Unspecified systolic (congestive) heart failure: Secondary | ICD-10-CM | POA: Diagnosis not present

## 2016-12-23 ENCOUNTER — Telehealth: Payer: Self-pay | Admitting: Cardiology

## 2016-12-23 NOTE — Telephone Encounter (Signed)
New Message  Chante from Dr. Carlyle Lipa office call requesting to speak with RN about getting an authorization for pts medication entresto 24-26mg . She states they need this auth. for pts application package for [place] office. Please call back to discuss

## 2016-12-24 NOTE — Telephone Encounter (Signed)
Per request from Chante I will complete a tier reduction form for ENTRESTO. Will have to get Dr Marlou Porch to sign form, he will be back in office 12/30/2016.

## 2016-12-24 NOTE — Telephone Encounter (Signed)
I talked with Chante at Dr Sherryll Burger office regarding prior authorization for [facility]. Im in the process of trying to find out patients prescription drug plan to submit this authorization.

## 2016-12-24 NOTE — Telephone Encounter (Signed)
Spoke with Carlos Gibson plan member #[id number] regarding prior authorization for ENTRESTO, representative states a PA has been approved dates 06/05/16-06/06/18/ approval # [id number]. I will notify Dr Sherryll Burger office. I have ask for Tier Reduction form to be faxed to [place] in case we need it.  I just spoke with Chante and provided her this information for Dr Sherryll Burger office.

## 2016-12-25 NOTE — Telephone Encounter (Signed)
I called the pt back and updated him on the progress of his [place] PA and I advised him that Lovett Sox, RN has also done a tier exception on his Delene Loll to try to get the cost lowered.  He verbalized understanding and thanked me for calling him back.

## 2016-12-25 NOTE — Telephone Encounter (Signed)
Follow up   Pt is calling Al Pimple back

## 2016-12-26 ENCOUNTER — Other Ambulatory Visit: Payer: Self-pay | Admitting: Cardiology

## 2016-12-28 NOTE — Telephone Encounter (Signed)
Medication Detail    Disp Refills Start End   furosemide (LASIX) 40 MG tablet 60 tablet 11 07/29/2016    Sig: TAKE 2 TABLETS(80 MG) BY MOUTH DAILY   Sent to pharmacy as: furosemide (LASIX) 40 MG tablet   E-Prescribing Status: Receipt confirmed by pharmacy (07/29/2016 2:35 PM EDT)   Pharmacy   [id number] - [place], [address]

## 2016-12-29 NOTE — Telephone Encounter (Signed)
**Note De-Identified Michaell Grider Obfuscation** This Tier exception for Delene Loll has been approved by [employer] Approval good until 06/05/18.

## 2016-12-31 NOTE — Telephone Encounter (Signed)
**Note De-Identified Carlos Gibson Obfuscation** I was not involved in this encounter so I cannot close it. Thanks.

## 2017-01-03 DIAGNOSIS — I10 Essential (primary) hypertension: Secondary | ICD-10-CM | POA: Diagnosis not present

## 2017-01-03 DIAGNOSIS — N400 Enlarged prostate without lower urinary tract symptoms: Secondary | ICD-10-CM | POA: Diagnosis not present

## 2017-01-03 DIAGNOSIS — E119 Type 2 diabetes mellitus without complications: Secondary | ICD-10-CM | POA: Diagnosis not present

## 2017-01-03 DIAGNOSIS — Z7984 Long term (current) use of oral hypoglycemic drugs: Secondary | ICD-10-CM | POA: Diagnosis not present

## 2017-01-03 DIAGNOSIS — I5020 Unspecified systolic (congestive) heart failure: Secondary | ICD-10-CM | POA: Diagnosis not present

## 2017-01-11 DIAGNOSIS — N433 Hydrocele, unspecified: Secondary | ICD-10-CM | POA: Diagnosis not present

## 2017-01-11 DIAGNOSIS — R351 Nocturia: Secondary | ICD-10-CM | POA: Diagnosis not present

## 2017-01-11 DIAGNOSIS — N401 Enlarged prostate with lower urinary tract symptoms: Secondary | ICD-10-CM | POA: Diagnosis not present

## 2017-01-11 DIAGNOSIS — R3915 Urgency of urination: Secondary | ICD-10-CM | POA: Diagnosis not present

## 2017-01-21 ENCOUNTER — Telehealth: Payer: Self-pay | Admitting: Cardiology

## 2017-01-21 MED ORDER — SACUBITRIL-VALSARTAN 24-26 MG PO TABS: 1.0000 | ORAL_TABLET | Freq: Two times a day (BID) | ORAL | 7 refills | Status: DC

## 2017-01-21 NOTE — Telephone Encounter (Signed)
Pt's medication was sent to pt's pharmacy as requested. Confirmation received.  °

## 2017-01-21 NOTE — Telephone Encounter (Signed)
°*  STAT* If patient is at the pharmacy, call can be transferred to refill team.   1. Which medications need to be refilled? (please list name of each medication and dose if known) entresto 24-26mg   2. Which pharmacy/location (including street and city if local pharmacy) is medication to be sent to? [facility] [address]. Do they need a 30 day or 90 day supply? [place]

## 2017-02-03 DIAGNOSIS — I10 Essential (primary) hypertension: Secondary | ICD-10-CM | POA: Diagnosis not present

## 2017-02-03 DIAGNOSIS — I5020 Unspecified systolic (congestive) heart failure: Secondary | ICD-10-CM | POA: Diagnosis not present

## 2017-02-03 DIAGNOSIS — N400 Enlarged prostate without lower urinary tract symptoms: Secondary | ICD-10-CM | POA: Diagnosis not present

## 2017-02-03 DIAGNOSIS — E119 Type 2 diabetes mellitus without complications: Secondary | ICD-10-CM | POA: Diagnosis not present

## 2017-02-09 ENCOUNTER — Ambulatory Visit (INDEPENDENT_AMBULATORY_CARE_PROVIDER_SITE_OTHER): Payer: Medicare Other | Admitting: Cardiology

## 2017-02-09 ENCOUNTER — Encounter: Payer: Self-pay | Admitting: Cardiology

## 2017-02-09 VITALS — BP 138/62 | HR 72 | Ht 71.0 in | Wt 188.2 lb

## 2017-02-09 DIAGNOSIS — I255 Ischemic cardiomyopathy: Secondary | ICD-10-CM | POA: Diagnosis not present

## 2017-02-09 DIAGNOSIS — I493 Ventricular premature depolarization: Secondary | ICD-10-CM

## 2017-02-09 DIAGNOSIS — I10 Essential (primary) hypertension: Secondary | ICD-10-CM | POA: Diagnosis not present

## 2017-02-09 DIAGNOSIS — I2589 Other forms of chronic ischemic heart disease: Secondary | ICD-10-CM

## 2017-02-09 NOTE — Patient Instructions (Addendum)
Medication Instructions:  Your physician recommends that you continue on your current medications as directed. Please refer to the Current Medication list given to you today.  -- If you need a refill on your cardiac medications before your next appointment, please call your pharmacy. --  Labwork: None ordered  Testing/Procedures: None ordered  Follow-Up: Your physician wants you to follow-up in: 1 year with Dr. Cora Daniels will receive a reminder letter in the mail two months in advance. If you don't receive a letter, please call our office to schedule the follow-up appointment.  Thank you for choosing [facility]!!   Frederik Schmidt, RN [phone]  Any Other Special Instructions Will Be Listed Below (If Applicable).  Office will contact you about help with [facility]

## 2017-02-09 NOTE — Progress Notes (Signed)
Electrophysiology Office Note   Date:  02/09/2017   ID:  RAZA BAYLESS, DOB Feb 10, 1941, MRN [id number]  PCP:  [name], MD  Cardiologist:  Marlou Porch Primary Electrophysiologist:  Jaxtin Raimondo Meredith Leeds, MD    Chief Complaint  Patient presents with  . Follow-up    Nonischemic cardiomyopathy/Chronic systolic HF/PVC's     History of Present Illness: Carlos Gibson is a 76 y.o. male who presents today for electrophysiology evaluation.   Hx NICM/chronic systolic CHF (LHC 10/6544 with normal cors &EF 25-30%; last echo 05/2015 with EF 35-40%), intolerance to prior Toprol/carvedilol due to "feeling bad," secondary pulmonary hypertension, frequent PVCs, aortic root dilatation, Trifasicular block (RBBB + LAFB + 1st degree AVB), diabetes, Gilbert's syndrome, mild AI/MR by echo 05/2015, hyperlipidemia, thrombocytopenia, probable CKD stage III.  He was put on Entresto.  His ejection fraction has since improved to 40-45%.  Today, denies symptoms of palpitations, chest pain,  PND, lower extremity edema, claudication, dizziness, presyncope, syncope, bleeding, or neurologic sequela. The patient is tolerating medications without difficulties.  Is been having some shortness of breath over the last few months.  He has been adjusting his Lasix dose between 40 and 80 mg.  He is weighing himself every day and has gained a little bit of weight.  He is trying to walk around the block a few times a week.  He does get mildly short of breath at the tops of stairs.   Past Medical History:  Diagnosis Date  . Aortic regurgitation    a. mild AI by echo 01/2016.  [name] Aortic root dilation (HCC)   . Aortic valve regurgitation   . Bifascicular block   . Chronic systolic CHF (congestive heart failure) ([place])   . CKD (chronic kidney disease), stage III ([place]) 01/01/2016  . Diabetes ([place])   . Ejection fraction < 50%   . Frequent PVCs   . Gilbert's syndrome   . Hyperlipidemia   . Mild diastolic dysfunction   . Mitral  regurgitation    a. mod by echo 10/207.  [name] NICM (nonischemic cardiomyopathy) ([place])   . Pericardial effusion    a. small by echo 01/2016.  . Pulmonary hypertension ([place])   . Right BBB/left post fasc block   . Thrombocytopenia ([place])    History reviewed. No pertinent surgical history.   Current Outpatient Medications  Medication Sig Dispense Refill  . alfuzosin (UROXATRAL) 10 MG 24 hr tablet Take 10 mg by mouth daily.  3  . aspirin EC 81 MG tablet Take 81 mg by mouth daily.    . bisoprolol (ZEBETA) 5 MG tablet Take 1 tablet (5 mg total) by mouth daily. 90 tablet 3  . cholecalciferol (VITAMIN D) 1000 units tablet Take 1,000 Units by mouth every other day.    . finasteride (PROSCAR) 5 MG tablet Take 5 mg by mouth daily.   2  . furosemide (LASIX) 40 MG tablet TAKE 2 TABLETS(80 MG) BY MOUTH DAILY 60 tablet 11  . glimepiride (AMARYL) 4 MG tablet Take one tablet in the mornings and one-half tablet at night by mouth    . potassium chloride SA (K-DUR,KLOR-CON) 10 MEQ tablet Take 4 tablets (40 mEq total) by mouth daily. 120 tablet 2  . sacubitril-valsartan (ENTRESTO) 24-26 MG Take 1 tablet by mouth 2 (two) times daily. 60 tablet 7   No current facility-administered medications for this visit.     Allergies:   Ace inhibitors; Coreg [carvedilol]; and Toprol xl [metoprolol tartrate]   Social History:  The patient  reports that  has never smoked. he has never used smokeless tobacco. He reports that he does not drink alcohol or use drugs.   Family History:  The patient's family history includes Hypertension in his mother; Irregular heart beat in his father; Stroke in his mother; Unexplained death in his father.    ROS:  Please see the history of present illness.   Otherwise, review of systems is positive for fatigue, leg swelling, shortness of breath, back pain, muscle pain.   All other systems are reviewed and negative.   PHYSICAL EXAM: VS:  BP 138/62   Pulse 72   Ht 5\' 11"  (1.803 m)   Wt 188  lb 3.2 oz (85.4 kg)   BMI 26.25 kg/m  , BMI Body mass index is 26.25 kg/m. GEN: Well nourished, well developed, in no acute distress  HEENT: normal  Neck: no JVD, carotid bruits, or masses Cardiac: RRR; no murmurs, rubs, or gallops,no edema  Respiratory:  clear to auscultation bilaterally, normal work of breathing GI: soft, nontender, nondistended, + BS MS: no deformity or atrophy  Skin: warm and dry Neuro:  Strength and sensation are intact Psych: euthymic mood, full affect  EKG:  EKG is ordered today. Personal review of the ekg ordered shows sinus rhythm, right bundle branch block, left anterior fascicular block, PVC  Recent Labs: 07/02/2016: BUN 15; Creatinine, Ser 1.58; Potassium 3.9; Sodium 142    Lipid Panel  No results found for: CHOL, TRIG, HDL, CHOLHDL, VLDL, LDLCALC, LDLDIRECT   Wt Readings from Last 3 Encounters:  02/09/17 188 lb 3.2 oz (85.4 kg)  09/07/16 177 lb 6.4 oz (80.5 kg)  07/02/16 185 lb 8 oz (84.1 kg)      Other studies Reviewed: Additional studies/ records that were reviewed today include: TTE 09/01/16, Holter 01/08/16  Review of the above records today demonstrates:  - Left ventricle: The cavity size was moderately dilated. There was   mild concentric hypertrophy. Systolic function was mildly to   moderately reduced. The estimated ejection fraction was in the   range of 40% to 45%. Diffuse hypokinesis. Features are consistent   with a pseudonormal left ventricular filling pattern, with   concomitant abnormal relaxation and increased filling pressure   (grade 2 diastolic dysfunction). Doppler parameters are   consistent with indeterminate ventricular filling pressure. - Aortic valve: There was moderate regurgitation. Regurgitation   pressure half-time: 332 ms. - Mitral valve: Transvalvular velocity was within the normal range.   There was no evidence for stenosis. There was moderate   regurgitation directed posteriorly. Effective regurgitant  orifice   (PISA): 0.23 cm^2. Regurgitant volume (PISA): 42.6 ml. - Left atrium: The atrium was severely dilated. - Right ventricle: The cavity size was moderately dilated. Wall   thickness was normal. Systolic function was moderately reduced,   worse in the mid to apical regions. - Right atrium: The atrium was severely dilated. - Tricuspid valve: There was moderate regurgitation. - Pulmonic valve: There was moderate regurgitation. - Pulmonary arteries: Systolic pressure was severely increased. PA   peak pressure: 80 mm Hg (S). - Pericardium, extracardiac: A trivial pericardial effusion was   identified.   17000 PVC's - frequent, occasional couplets, rare triplets.  1500 PAC's   ASSESSMENT AND PLAN:  1.  Nonischemic cardiomyopathy: Currently on Entresto.  He has felt improved since starting that medication.  He does continue to have shortness of breath.  I encouraged daily weights and adjusting Lasix dose based on those.  He  is having trouble affording his Entresto.  Donnavan Covault ask our prior authorization department to see if we can improve his cost for medications.  2. PVCs: 7% PVCs on monitor.  Currently asymptomatic.  3. Hypertension: Well-controlled  Current medicines are reviewed at length with the patient today.   The patient does not have concerns regarding his medicines.  The following changes were made today:  none  Labs/ tests ordered today include:  Orders Placed This Encounter  Procedures  . EKG 12-Lead     Disposition:   FU with Kishon Garriga 12 months  Signed, Keri Tavella Meredith Leeds, MD  02/09/2017 10:02 AM     [facility] [address] [place] [address] [phone] (office) [phone] (fax)

## 2017-02-10 ENCOUNTER — Telehealth: Payer: Self-pay

## 2017-02-10 NOTE — Telephone Encounter (Addendum)
**Note De-Identified [name] Obfuscation** I called the pts pharmacy [facility]) and was advised that he may be in the donut hole as his Entresto costs $245.51 for a 30 day supply.  I have left a message on the pts VM asking him to call us back.

## 2017-02-10 NOTE — Telephone Encounter (Signed)
**Note De-identified Kaiyana Bedore Obfuscation** -----  **Note De-Identified Vessie Olmsted Obfuscation** Message from Stanton Kidney, RN sent at 02/09/2017 10:13 AM EST ----- Regarding: Delene Loll cost assistance Please contact pt in regards to help with cost of Entresto.  thx :)

## 2017-02-10 NOTE — Telephone Encounter (Signed)
**Note De-Identified Philo Kurtz Obfuscation** The pt states that he cannot afford his Delene Loll and that he makes too much money to get pt assistance. He wants to know if there is another medication he can take that is cheaper.  He is advised that I will send this message to Dr Marlou Porch and that we will call him back with Dr Kingsley Plan recommendations.

## 2017-02-11 NOTE — Telephone Encounter (Signed)
Since patient cannot afford Entresto, stop it. Start losartan 25 mg once a day. [name], MD

## 2017-02-12 MED ORDER — LOSARTAN POTASSIUM 25 MG PO TABS: 25.0000 mg | ORAL_TABLET | Freq: Every day | ORAL | 3 refills | Status: DC

## 2017-02-12 NOTE — Telephone Encounter (Signed)
**Note De-Identified Lucus Lambertson Obfuscation** The pt is advised and he verbalized understanding and is in agreement with plan. Per his request I have sent his Losartan RX to [facility] on [address] in [place].

## 2017-03-01 DIAGNOSIS — Z23 Encounter for immunization: Secondary | ICD-10-CM | POA: Diagnosis not present

## 2017-04-07 ENCOUNTER — Telehealth: Payer: Self-pay | Admitting: Cardiology

## 2017-04-07 NOTE — Telephone Encounter (Signed)
Patient calling and complaining of not being able to sleep at night and having SOB on exertion for the last several weeks. Patient states that he has gained 8 pounds in the last 3 weeks. Patient states that he has very minimal swelling in his lower extremities. Patient has been avoiding salt in his diet. He states that he is taking lasix 80 mg QD and potassium 40 mEq QD. He states that he feels like his symptoms have worsened since being switched from entresto to losartan. Patient states that he could not afford the entresto. Patient states that his BP has been 117/65 HR 72. Patient denies any chest pain, lightheadedness, dizziness, or any other symptoms at this time. Made patient aware that the information would be forwarded to Dr. Marlou Porch and Dr. Curt Bears for review and recommendation. Patient verbalized understanding.

## 2017-04-07 NOTE — Telephone Encounter (Signed)
Pt c/o Shortness Of Breath: STAT if SOB developed within the last 24 hours or pt is noticeably SOB on the phone  1. Are you currently SOB (can you hear that pt is SOB on the phone)? no  2. How long have you been experiencing SOB? Couple of weeks   3. Are you SOB when sitting or when up moving around? Sitting or sleeping   4. Are you currently experiencing any other symptoms? No

## 2017-04-08 DIAGNOSIS — K4090 Unilateral inguinal hernia, without obstruction or gangrene, not specified as recurrent: Secondary | ICD-10-CM | POA: Diagnosis not present

## 2017-04-08 DIAGNOSIS — R3915 Urgency of urination: Secondary | ICD-10-CM | POA: Diagnosis not present

## 2017-04-08 DIAGNOSIS — N401 Enlarged prostate with lower urinary tract symptoms: Secondary | ICD-10-CM | POA: Diagnosis not present

## 2017-04-08 NOTE — Telephone Encounter (Signed)
Appt made to see Almyra Deforest, PA at [facility] office tomorrow as [address] had no options. Pt is agreeable to plan and is appreciative.

## 2017-04-08 NOTE — Telephone Encounter (Signed)
Needs follow up with NP to discuss possible volume overload and adjusting meds, possibly a way to get him back on Entresto.

## 2017-04-09 ENCOUNTER — Ambulatory Visit (INDEPENDENT_AMBULATORY_CARE_PROVIDER_SITE_OTHER): Payer: Medicare Other | Admitting: Physician Assistant

## 2017-04-09 ENCOUNTER — Encounter: Payer: Self-pay | Admitting: Physician Assistant

## 2017-04-09 VITALS — BP 132/82 | HR 75 | Ht 71.0 in | Wt 185.0 lb

## 2017-04-09 DIAGNOSIS — E785 Hyperlipidemia, unspecified: Secondary | ICD-10-CM

## 2017-04-09 DIAGNOSIS — E119 Type 2 diabetes mellitus without complications: Secondary | ICD-10-CM

## 2017-04-09 DIAGNOSIS — I428 Other cardiomyopathies: Secondary | ICD-10-CM | POA: Diagnosis not present

## 2017-04-09 DIAGNOSIS — I5022 Chronic systolic (congestive) heart failure: Secondary | ICD-10-CM | POA: Diagnosis not present

## 2017-04-09 DIAGNOSIS — R011 Cardiac murmur, unspecified: Secondary | ICD-10-CM | POA: Diagnosis not present

## 2017-04-09 DIAGNOSIS — Z79899 Other long term (current) drug therapy: Secondary | ICD-10-CM | POA: Diagnosis not present

## 2017-04-09 DIAGNOSIS — I351 Nonrheumatic aortic (valve) insufficiency: Secondary | ICD-10-CM

## 2017-04-09 MED ORDER — FUROSEMIDE 40 MG PO TABS: ORAL_TABLET | ORAL | 11 refills | Status: DC

## 2017-04-09 MED ORDER — SPIRONOLACTONE 25 MG PO TABS: 12.5000 mg | ORAL_TABLET | Freq: Every day | ORAL | 11 refills | Status: DC

## 2017-04-09 NOTE — Patient Instructions (Signed)
Medication Instructions:  INCREASE- Furosemide(Lasix) 80 mg in the morning and 40 mg at night  START- Spironolactone 12.5 mg daily  If you need a refill on your cardiac medications before your next appointment, please call your pharmacy.  Labwork: BMP Today and BMP in 1 Week HERE IN OUR OFFICE AT [facility]  Take the provided lab slips for you to take with you to the lab for you blood draw.   You will NOT need to fast   You may go to any [facility] lab that is convenient for you however, we do have a lab in our office that is able to assist you. You do NOT need an appointment for our lab. Once in our office lobby there is a podium to the right of the check-in desk where you are to sign-in and ring a doorbell to alert [place] you are here. Lab is open Monday-Friday from 8:00am to 4:00pm; and is closed for lunch from 12:45p-1:45pm   Testing/Procedures: Your physician has requested that you have an echocardiogram. Echocardiography is a painless test that uses sound waves to create images of your heart. It provides your doctor with information about the size and shape of your heart and how well your heart's chambers and valves are working. This procedure takes approximately one hour. There are no restrictions for this procedure.  Special Instructions:  Happy New Year!!  Follow-Up: Your physician wants you to follow-up in: 3-4 Weeks with Almyra Deforest.    Thank you for choosing [facility] at [facility]!!

## 2017-04-09 NOTE — Progress Notes (Signed)
Cardiology Office Note    Date:  04/11/2017   ID:  Carlos Gibson, DOB 07-21-1940, MRN [id number]  PCP:  [name], MD  Cardiologist:  Dr. Marlou Porch  Primary electrophysiologist: Dr. Curt Bears  Chief Complaint  Patient presents with  . Follow-up    seen for Dr. Marlou Porch.     History of Present Illness:  Carlos Gibson is a 77 y.o. male with mild AI, chronic systolic heart failure, nonischemic cardiomyopathy with baseline EF 25-35%, trifascicular block, aortic root dilatation, diabetes, Gilbert syndrome, hyperlipidemia, thrombocytopenia and CKD stage III.  He had a cardiac catheterization in September 2016 that showed normal coronaries, EF 25-30% at the time.  EF has improved on echocardiogram to 35-40% by February 2017.  He is intolerant of Toprol XL and carvedilol.  He was eventually placed on Entresto.  His ejection fraction has since improved to 40-45%.  Unfortunately due to cost reasons, his Delene Loll was discontinued in November and switch back to losartan  For the past 3 weeks, patient has been having worsening shortness of breath with exertion.  He denies any chest pain, he has at least 1+ pitting edema in bilateral lower extremity.  For the past 4 weeks, he has increased his Lasix to 80 mg daily.  This has not helped.  I will increase his Lasix to 80 mg in a.m. and 40 mg in p.m.  He will need a basic metabolic panel today and also in 1 week.  On physical exam, he has a loud murmur near the mitral valve area, he reportedly had a history of moderate mitral regurgitation on the last echocardiogram in 2018, I am concerned that his shortness of breath may be related to decreasing ejection fraction or worsening of the mitral valve, I recommended a repeat echocardiogram.  I will see the patient back myself in about 3-4 weeks for follow-up.  Otherwise I also added low-dose spironolactone 12.5 mg daily given his LV dysfunction.   Past Medical History:  Diagnosis Date  . Aortic regurgitation    a.  mild AI by echo 01/2016.  [name] Aortic root dilation (HCC)   . Aortic valve regurgitation   . Bifascicular block   . Chronic systolic CHF (congestive heart failure) ([place])   . CKD (chronic kidney disease), stage III ([place]) 01/01/2016  . Diabetes ([place])   . Ejection fraction < 50%   . Frequent PVCs   . Gilbert's syndrome   . Hyperlipidemia   . Mild diastolic dysfunction   . Mitral regurgitation    a. mod by echo 10/207.  [name] NICM (nonischemic cardiomyopathy) ([place])   . Pericardial effusion    a. small by echo 01/2016.  . Pulmonary hypertension ([place])   . Right BBB/left post fasc block   . Thrombocytopenia ([place])     Past Surgical History:  Procedure Laterality Date  . CARDIAC CATHETERIZATION N/A 12/20/2014   Procedure: Right/Left Heart Cath and Coronary Angiography;  Surgeon: Jerline Pain, MD;  Location: [facility];  Service: Cardiovascular;  Laterality: N/A;    Current Medications: Outpatient Medications Prior to Visit  Medication Sig Dispense Refill  . alfuzosin (UROXATRAL) 10 MG 24 hr tablet Take 10 mg by mouth daily.  3  . aspirin EC 81 MG tablet Take 81 mg by mouth daily.    . bisoprolol (ZEBETA) 5 MG tablet Take 1 tablet (5 mg total) by mouth daily. 90 tablet 3  . cholecalciferol (VITAMIN D) 1000 units tablet Take 1,000 Units by mouth every  other day.    . finasteride (PROSCAR) 5 MG tablet Take 5 mg by mouth daily.   2  . glimepiride (AMARYL) 4 MG tablet Take one tablet in the mornings and one-half tablet at night by mouth    . losartan (COZAAR) 25 MG tablet Take 1 tablet (25 mg total) daily by mouth. 90 tablet 3  . potassium chloride SA (K-DUR,KLOR-CON) 10 MEQ tablet Take 4 tablets (40 mEq total) by mouth daily. 120 tablet 2  . furosemide (LASIX) 40 MG tablet TAKE 2 TABLETS(80 MG) BY MOUTH DAILY 60 tablet 11   No facility-administered medications prior to visit.      Allergies:   Ace inhibitors; Coreg [carvedilol]; and Toprol xl [metoprolol tartrate]   Social History    Socioeconomic History  . Marital status: Married    Spouse name: None  . Number of children: None  . Years of education: None  . Highest education level: None  Social Needs  . Financial resource strain: None  . Food insecurity - worry: None  . Food insecurity - inability: None  . Transportation needs - medical: None  . Transportation needs - non-medical: None  Occupational History  . None  Tobacco Use  . Smoking status: Never Smoker  . Smokeless tobacco: Never Used  Substance and Sexual Activity  . Alcohol use: No    Alcohol/week: 0.0 oz  . Drug use: No  . Sexual activity: None  Other Topics Concern  . None  Social History Narrative  . None     Family History:  The patient's family history includes Hypertension in his mother; Irregular heart beat in his father; Stroke in his mother; Unexplained death in his father.   ROS:   Please see the history of present illness.    ROS All other systems reviewed and are negative.   PHYSICAL EXAM:   VS:  BP 132/82   Pulse 75   Ht 5\' 11"  (1.803 m)   Wt 185 lb (83.9 kg)   BMI 25.80 kg/m    GEN: Well nourished, well developed, in no acute distress  HEENT: normal  Neck: no JVD, carotid bruits, or masses Cardiac: RRR; no rubs, or gallops,no edema  3/6 systolic murmur Respiratory:  clear to auscultation bilaterally, normal work of breathing GI: soft, nontender, nondistended, + BS MS: no deformity or atrophy  Skin: warm and dry, no rash Neuro:  Alert and Oriented x 3, Strength and sensation are intact Psych: euthymic mood, full affect  Wt Readings from Last 3 Encounters:  04/09/17 185 lb (83.9 kg)  02/09/17 188 lb 3.2 oz (85.4 kg)  09/07/16 177 lb 6.4 oz (80.5 kg)      Studies/Labs Reviewed:   EKG:  EKG is not ordered today.   Recent Labs: 04/09/2017: BUN 22; Creatinine, Ser 1.79; Potassium 4.2; Sodium 143   Lipid Panel No results found for: CHOL, TRIG, HDL, CHOLHDL, VLDL, LDLCALC, LDLDIRECT  Additional studies/  records that were reviewed today include:   Echo 09/01/2016 LV EF: 40% -   45%  ------------------------------------------------------------------- Indications:      CHF (I50.21).  ------------------------------------------------------------------- History:   PMH:  Trivial Pericardial Effusion, Aortic Insufficiency, Aortic Root Dilation, PVC&'s, Right Bundle Branch Block, Edema, Non-Ischemic Cardiomyopathy, Chronic Kidney Disease. Congestive heart failure.  Aortic valve disease.  Primary pulmonary hypertension.  Risk factors:  Family history of coronary artery disease. Diabetes mellitus. Dyslipidemia.  ------------------------------------------------------------------- Study Conclusions  - Left ventricle: The cavity size was moderately dilated. There was   mild  concentric hypertrophy. Systolic function was mildly to   moderately reduced. The estimated ejection fraction was in the   range of 40% to 45%. Diffuse hypokinesis. Features are consistent   with a pseudonormal left ventricular filling pattern, with   concomitant abnormal relaxation and increased filling pressure   (grade 2 diastolic dysfunction). Doppler parameters are   consistent with indeterminate ventricular filling pressure. - Aortic valve: There was moderate regurgitation. Regurgitation   pressure half-time: 332 ms. - Mitral valve: Transvalvular velocity was within the normal range.   There was no evidence for stenosis. There was moderate   regurgitation directed posteriorly. Effective regurgitant orifice   (PISA): 0.23 cm^2. Regurgitant volume (PISA): 42.6 ml. - Left atrium: The atrium was severely dilated. - Right ventricle: The cavity size was moderately dilated. Wall   thickness was normal. Systolic function was moderately reduced,   worse in the mid to apical regions. - Right atrium: The atrium was severely dilated. - Tricuspid valve: There was moderate regurgitation. - Pulmonic valve: There was moderate  regurgitation. - Pulmonary arteries: Systolic pressure was severely increased. PA   peak pressure: 80 mm Hg (S). - Pericardium, extracardiac: A trivial pericardial effusion was   identified.   ASSESSMENT:    1. Chronic systolic heart failure ([place])   2. Medication management   3. Aortic valve insufficiency, etiology of cardiac valve disease unspecified   4. Murmur   5. NICM (nonischemic cardiomyopathy) ([place])   6. Hyperlipidemia, unspecified hyperlipidemia type   7. Controlled type 2 diabetes mellitus without complication, without long-term current use of insulin (HCC)      PLAN:  In order of problems listed above:  1. Chronic systolic HF: recently patient has been having more shortness of breath.  I am concerned of possible volume overload, I would increase his Lasix to 80 mg a.m. and 40 mg in p.m.  I will also start him on spironolactone 12.5 mg daily.  Unfortunately due to financial reasons, his Delene Loll was recently switched to losartan.  I also recommended a repeat echocardiogram due to concern of further LV dysfunction or worsening mitral valve.  2. Moderate mitral valve regurgitation: Given recent shortness of breath, I am concerned that his mitral valve is worsening.  Repeat echocardiogram.  3. Nonischemic cardiomyopathy: Currently on bisoprolol, losartan, and spironolactone.   4. DM 2: Managed by primary care provider    Medication Adjustments/Labs and Tests Ordered: Current medicines are reviewed at length with the patient today.  Concerns regarding medicines are outlined above.  Medication changes, Labs and Tests ordered today are listed in the Patient Instructions below. Patient Instructions  Medication Instructions:  INCREASE- Furosemide(Lasix) 80 mg in the morning and 40 mg at night  START- Spironolactone 12.5 mg daily  If you need a refill on your cardiac medications before your next appointment, please call your pharmacy.  Labwork: BMP Today and BMP in 1 Week  HERE IN OUR OFFICE AT [facility]  Take the provided lab slips for you to take with you to the lab for you blood draw.   You will NOT need to fast   You may go to any [facility] lab that is convenient for you however, we do have a lab in our office that is able to assist you. You do NOT need an appointment for our lab. Once in our office lobby there is a podium to the right of the check-in desk where you are to sign-in and ring a doorbell to alert [place] you are here. Lab  is open Monday-Friday from 8:00am to 4:00pm; and is closed for lunch from 12:45p-1:45pm   Testing/Procedures: Your physician has requested that you have an echocardiogram. Echocardiography is a painless test that uses sound waves to create images of your heart. It provides your doctor with information about the size and shape of your heart and how well your heart's chambers and valves are working. This procedure takes approximately one hour. There are no restrictions for this procedure.  Special Instructions:  Happy New Year!!  Follow-Up: Your physician wants you to follow-up in: 3-4 Weeks with Almyra Deforest.    Thank you for choosing [facility] at [employer], [place]  04/11/2017 10:25 AM    [address], [place], [place]  [address] Phone: [phone]; Fax: [phone]

## 2017-04-10 LAB — BASIC METABOLIC PANEL
BUN/Creatinine Ratio: 12 (ref 10–24)
BUN: 22 mg/dL (ref 8–27)
CO2: 23 mmol/L (ref 20–29)
Calcium: 9.5 mg/dL (ref 8.6–10.2)
Chloride: 102 mmol/L (ref 96–106)
Creatinine, Ser: 1.79 mg/dL — ABNORMAL HIGH (ref 0.76–1.27)
GFR, EST AFRICAN AMERICAN: 42 mL/min/{1.73_m2} — AB (ref >59)
GFR, EST NON AFRICAN AMERICAN: 36 mL/min/{1.73_m2} — AB (ref >59)
Glucose: 169 mg/dL — ABNORMAL HIGH (ref 65–99)
POTASSIUM: 4.2 mmol/L (ref 3.5–5.2)
SODIUM: 143 mmol/L (ref 134–144)

## 2017-04-11 ENCOUNTER — Encounter: Payer: Self-pay | Admitting: Physician Assistant

## 2017-04-12 NOTE — Progress Notes (Signed)
Pending repeat BMET this week. Kidney function slightly lower than 9 month ago. Electrolyte good

## 2017-04-13 ENCOUNTER — Other Ambulatory Visit (HOSPITAL_COMMUNITY): Payer: Medicare Other

## 2017-04-27 ENCOUNTER — Other Ambulatory Visit: Payer: Self-pay | Admitting: Physician Assistant

## 2017-04-29 ENCOUNTER — Other Ambulatory Visit: Payer: Self-pay

## 2017-04-29 ENCOUNTER — Ambulatory Visit (HOSPITAL_COMMUNITY): Payer: Medicare Other | Attending: Cardiology

## 2017-04-29 DIAGNOSIS — E785 Hyperlipidemia, unspecified: Secondary | ICD-10-CM | POA: Insufficient documentation

## 2017-04-29 DIAGNOSIS — I272 Pulmonary hypertension, unspecified: Secondary | ICD-10-CM | POA: Insufficient documentation

## 2017-04-29 DIAGNOSIS — R011 Cardiac murmur, unspecified: Secondary | ICD-10-CM | POA: Diagnosis not present

## 2017-04-29 DIAGNOSIS — I509 Heart failure, unspecified: Secondary | ICD-10-CM | POA: Diagnosis not present

## 2017-04-29 DIAGNOSIS — I4891 Unspecified atrial fibrillation: Secondary | ICD-10-CM | POA: Diagnosis not present

## 2017-04-29 DIAGNOSIS — I351 Nonrheumatic aortic (valve) insufficiency: Secondary | ICD-10-CM | POA: Diagnosis not present

## 2017-04-29 DIAGNOSIS — I083 Combined rheumatic disorders of mitral, aortic and tricuspid valves: Secondary | ICD-10-CM | POA: Insufficient documentation

## 2017-04-29 DIAGNOSIS — I371 Nonrheumatic pulmonary valve insufficiency: Secondary | ICD-10-CM | POA: Diagnosis not present

## 2017-04-29 DIAGNOSIS — E1122 Type 2 diabetes mellitus with diabetic chronic kidney disease: Secondary | ICD-10-CM | POA: Diagnosis not present

## 2017-04-29 DIAGNOSIS — N189 Chronic kidney disease, unspecified: Secondary | ICD-10-CM | POA: Insufficient documentation

## 2017-05-05 DIAGNOSIS — I10 Essential (primary) hypertension: Secondary | ICD-10-CM | POA: Diagnosis not present

## 2017-05-05 DIAGNOSIS — I5020 Unspecified systolic (congestive) heart failure: Secondary | ICD-10-CM | POA: Diagnosis not present

## 2017-05-05 DIAGNOSIS — E7800 Pure hypercholesterolemia, unspecified: Secondary | ICD-10-CM | POA: Diagnosis not present

## 2017-05-05 DIAGNOSIS — D696 Thrombocytopenia, unspecified: Secondary | ICD-10-CM | POA: Diagnosis not present

## 2017-05-05 DIAGNOSIS — Z7984 Long term (current) use of oral hypoglycemic drugs: Secondary | ICD-10-CM | POA: Diagnosis not present

## 2017-05-05 DIAGNOSIS — Z79899 Other long term (current) drug therapy: Secondary | ICD-10-CM | POA: Diagnosis not present

## 2017-05-05 DIAGNOSIS — K4090 Unilateral inguinal hernia, without obstruction or gangrene, not specified as recurrent: Secondary | ICD-10-CM | POA: Diagnosis not present

## 2017-05-05 DIAGNOSIS — E119 Type 2 diabetes mellitus without complications: Secondary | ICD-10-CM | POA: Diagnosis not present

## 2017-05-10 ENCOUNTER — Ambulatory Visit (INDEPENDENT_AMBULATORY_CARE_PROVIDER_SITE_OTHER): Payer: Medicare Other | Admitting: Physician Assistant

## 2017-05-10 ENCOUNTER — Encounter: Payer: Self-pay | Admitting: Physician Assistant

## 2017-05-10 VITALS — BP 140/78 | HR 74 | Ht 71.0 in | Wt 166.0 lb

## 2017-05-10 DIAGNOSIS — I77810 Thoracic aortic ectasia: Secondary | ICD-10-CM | POA: Diagnosis not present

## 2017-05-10 DIAGNOSIS — I428 Other cardiomyopathies: Secondary | ICD-10-CM

## 2017-05-10 DIAGNOSIS — I5022 Chronic systolic (congestive) heart failure: Secondary | ICD-10-CM

## 2017-05-10 DIAGNOSIS — N183 Chronic kidney disease, stage 3 unspecified: Secondary | ICD-10-CM

## 2017-05-10 DIAGNOSIS — E785 Hyperlipidemia, unspecified: Secondary | ICD-10-CM | POA: Diagnosis not present

## 2017-05-10 NOTE — Patient Instructions (Signed)
Medication Instructions: Your physician recommends that you continue on your current medications as directed. Please refer to the Current Medication list given to you today.  Labwork: Your physician recommends that you return for lab work today: BMET   Follow-Up: Your physician recommends that you schedule a follow-up appointment in: 2 months with Dr. Marlou Porch.  If you need a refill on your cardiac medications before your next appointment, please call your pharmacy.

## 2017-05-10 NOTE — Progress Notes (Signed)
Cardiology Office Note    Date:  05/12/2017   ID:  Carlos Gibson, DOB [birth year], MRN [id number]  PCP:  [name], MD  Cardiologist:  Dr. Marlou Porch Primary electrophysiologist: Dr. Curt Bears   Chief Complaint  Patient presents with  . Follow-up    seen for Dr. Marlou Porch    History of Present Illness:  Carlos Gibson is a 77 y.o. male with mild AI, chronic systolic heart failure, NICM with baseline EF 25-35%, trifascicular block, aortic root dilatation, diabetes, Gilbert syndrome, hyperlipidemia, thrombocytopenia and CKD stage III.  He had a cardiac catheterization in September 2016 that showed normal coronaries, EF 25-30% at the time.  EF has improved on echocardiogram to 35-40% by February 2017.  He is intolerant of Toprol XL and carvedilol.  He was eventually placed on Entresto.  His ejection fraction has since improved to 40-45%.  Unfortunately due to cost reasons, his Delene Loll was discontinued in November and switch back to losartan  I last saw the patient on 04/09/2017 for worsening shortness of breath.  He had a 1+ pitting edema in bilateral lower extremity.  I increased his Lasix to 80 mg in a.m. and 40 mg p.m.  I added 12.5 mg daily of spironolactone for his LV dysfunction.  I also obtain an echocardiogram to evaluate for worsening LV function or significant mitral valve issue.  Echocardiogram obtained on 04/29/2017 showed decreased ejection fraction of 25-30%, mild LVH, moderate AI, moderate MR, severe LAE, peak PA pressure 38 mmHg, trivial amount of pericardial effusion. He was supposed to return in a week for repeat basic metabolic panel, this was never done.  Patient presents today for cardiology office visit.  His shortness of breath has completely resolved.  He has no lower extremity edema on physical exam.  Symptomatically he has significant improvement compared to last visit.  His weight is also decreased by 20 pounds.  I will obtain a basic metabolic panel today to check for renal  function and electrolyte.  Otherwise, my main concern is his ejection fraction has decreased again to the 2016 level.  I think he need to be restarted on Entresto, however at this point the cost is prohibitive A.  We have given him contact information for Entresto medication assistance program and also [employer] program as well.  He will contact those program to see if he qualify for any of them.  If he does, he will give [place] a call and we can write him a new prescription.  Otherwise he will follow-up with Dr. Marlou Porch in 25-month.  He has no chest pain, lower extremity edema, orthopnea or PND.    Past Medical History:  Diagnosis Date  . Aortic regurgitation    a. mild AI by echo 01/2016.  [name] Aortic root dilation (HCC)   . Aortic valve regurgitation   . Bifascicular block   . Chronic systolic CHF (congestive heart failure) ([place])   . CKD (chronic kidney disease), stage III ([place]) 01/01/2016  . Diabetes ([place])   . Ejection fraction < 50%   . Frequent PVCs   . Gilbert's syndrome   . Hyperlipidemia   . Mild diastolic dysfunction   . Mitral regurgitation    a. mod by echo 10/207.  [name] NICM (nonischemic cardiomyopathy) ([place])   . Pericardial effusion    a. small by echo 01/2016.  . Pulmonary hypertension ([place])   . Right BBB/left post fasc block   . Thrombocytopenia ([place])     Past Surgical History:  Procedure Laterality Date  . CARDIAC CATHETERIZATION N/A 12/20/2014   Procedure: Right/Left Heart Cath and Coronary Angiography;  Surgeon: Jerline Pain, MD;  Location: [facility];  Service: Cardiovascular;  Laterality: N/A;    Current Medications: Outpatient Medications Prior to Visit  Medication Sig Dispense Refill  . alfuzosin (UROXATRAL) 10 MG 24 hr tablet Take 10 mg by mouth daily.  3  . aspirin EC 81 MG tablet Take 81 mg by mouth daily.    [name] atorvastatin (LIPITOR) 10 MG tablet TK 1 T PO QD  6  . bisoprolol (ZEBETA) 5 MG tablet Take 1 tablet (5 mg total) by mouth daily. 90 tablet 3   . cholecalciferol (VITAMIN D) 1000 units tablet Take 1,000 Units by mouth every other day.    . finasteride (PROSCAR) 5 MG tablet Take 5 mg by mouth daily.   2  . furosemide (LASIX) 40 MG tablet Take 2 tablets in the morning and 1 tablet at night 45 tablet 11  . glimepiride (AMARYL) 4 MG tablet Take one tablet in the mornings and one-half tablet at night by mouth    . potassium chloride (K-DUR,KLOR-CON) 10 MEQ tablet Take 4 tablets (40 mEq total) by mouth daily. 120 tablet 11  . spironolactone (ALDACTONE) 25 MG tablet Take 0.5 tablets (12.5 mg total) by mouth daily. 30 tablet 11  . losartan (COZAAR) 25 MG tablet Take 1 tablet (25 mg total) daily by mouth. (Patient not taking: Reported on 05/10/2017) 90 tablet 3   No facility-administered medications prior to visit.      Allergies:   Ace inhibitors; Coreg [carvedilol]; and Toprol xl [metoprolol tartrate]   Social History   Socioeconomic History  . Marital status: Married    Spouse name: None  . Number of children: None  . Years of education: None  . Highest education level: None  Social Needs  . Financial resource strain: None  . Food insecurity - worry: None  . Food insecurity - inability: None  . Transportation needs - medical: None  . Transportation needs - non-medical: None  Occupational History  . None  Tobacco Use  . Smoking status: Never Smoker  . Smokeless tobacco: Never Used  Substance and Sexual Activity  . Alcohol use: No    Alcohol/week: 0.0 oz  . Drug use: No  . Sexual activity: None  Other Topics Concern  . None  Social History Narrative  . None     Family History:  The patient's family history includes Hypertension in his mother; Irregular heart beat in his father; Stroke in his mother; Unexplained death in his father.   ROS:   Please see the history of present illness.    ROS All other systems reviewed and are negative.   PHYSICAL EXAM:   VS:  BP 140/78   Pulse 74   Ht 5\' 11"  (1.803 m)   Wt 166 lb  (75.3 kg)   SpO2 96%   BMI 23.15 kg/m    GEN: Well nourished, well developed, in no acute distress  HEENT: normal  Neck: no JVD, carotid bruits, or masses Cardiac: RRR; no murmurs, rubs, or gallops,no edema  Respiratory:  clear to auscultation bilaterally, normal work of breathing GI: soft, nontender, nondistended, + BS MS: no deformity or atrophy  Skin: warm and dry, no rash Neuro:  Alert and Oriented x 3, Strength and sensation are intact Psych: euthymic mood, full affect  Wt Readings from Last 3 Encounters:  05/10/17 166 lb (75.3 kg)  04/09/17 185 lb (83.9 kg)  02/09/17 188 lb 3.2 oz (85.4 kg)      Studies/Labs Reviewed:   EKG:  EKG is not ordered today.   Recent Labs: 05/10/2017: BUN 20; Creatinine, Ser 1.56; Potassium 4.2; Sodium 145   Lipid Panel No results found for: CHOL, TRIG, HDL, CHOLHDL, VLDL, LDLCALC, LDLDIRECT  Additional studies/ records that were reviewed today include:   Echo 04/29/2017  LV EF: 25% -   30%  ------------------------------------------------------------------- Indications:      Aortic Valve Disorder (I35.1).  ------------------------------------------------------------------- History:   PMH:  Hyperlipidemia, CKD.  Murmur.  Congestive heart failure.  Risk factors:  Diabetes mellitus.  ------------------------------------------------------------------- Study Conclusions  - Left ventricle: The cavity size was severely dilated. There was   mild concentric hypertrophy. Systolic function was severely   reduced. The estimated ejection fraction was in the range of 25%   to 30%. Severe diffuse hypokinesis with regional variations. - Aortic valve: There was moderate regurgitation directed   eccentrically in the LVOT and towards the mitral anterior   leaflet. Regurgitation pressure half-time: 300 ms. - Aorta: Aortic root dimension: 40 mm (ED). - Aortic root: The aortic root was mildly dilated. - Mitral valve: There was moderate  regurgitation directed   eccentrically and posteriorly. - Left atrium: The atrium was severely dilated. - Right ventricle: The cavity size was moderately dilated. Wall   thickness was normal. Systolic function was moderately to   severely reduced. - Tricuspid valve: There was mild regurgitation. - Pulmonic valve: There was moderate regurgitation. - Pulmonary arteries: PA peak pressure: 38 mm Hg (S). - Pericardium, extracardiac: A trivial, free-flowing pericardial   effusion was identified posterior to the heart and along the   right atrial free wall.  Impressions:   - The right ventricular systolic pressure was increased consistent   with mild pulmonary hypertension.    ASSESSMENT:    1. Chronic systolic heart failure ([place])   2. NICM (nonischemic cardiomyopathy) ([place])   3. Hyperlipidemia, unspecified hyperlipidemia type   4. CKD (chronic kidney disease), stage III ([place])   5. Aortic root dilatation (HCC)      PLAN:  In order of problems listed above:  1. Chronic diastolic heart failure: Recent drop in the ejection fraction, I have discussed with our clinical pharmacist to see if the patient can get into medication assistance program with Entresto.  Otherwise continue on the current heart failure medication.  He appears to be euvolemic after increasing diuretic.  Will obtain a basic metabolic panel.  Ultimately I think Delene Loll is likely the best option to increase his ejection fraction.  2. Nonischemic cardiomyopathy: See above, recent decrease of ejection fraction down to 25% from the previous 45% in 2018  3. Hyperlipidemia: Continue Lipitor 10 mg daily  4. Aortic root dilatation: Aortic root measuring 40 mm on recent echocardiogram.  5. CKD stage III: Obtain basic metabolic panel    Medication Adjustments/Labs and Tests Ordered: Current medicines are reviewed at length with the patient today.  Concerns regarding medicines are outlined above.  Medication changes, Labs  and Tests ordered today are listed in the Patient Instructions below. Patient Instructions  Medication Instructions: Your physician recommends that you continue on your current medications as directed. Please refer to the Current Medication list given to you today.  Labwork: Your physician recommends that you return for lab work today: BMET   Follow-Up: Your physician recommends that you schedule a follow-up appointment in: 2 months with Dr. Marlou Porch.  If you  need a refill on your cardiac medications before your next appointment, please call your pharmacy.     Hilbert Corrigan, [place]  05/12/2017 1:56 PM    [facility] [address], [place], [place]  [address] Phone: [phone]; Fax: [phone]

## 2017-05-11 LAB — BASIC METABOLIC PANEL
BUN/Creatinine Ratio: 13 (ref 10–24)
BUN: 20 mg/dL (ref 8–27)
CALCIUM: 9.9 mg/dL (ref 8.6–10.2)
CO2: 25 mmol/L (ref 20–29)
CREATININE: 1.56 mg/dL — AB (ref 0.76–1.27)
Chloride: 102 mmol/L (ref 96–106)
GFR calc Af Amer: 49 mL/min/{1.73_m2} — ABNORMAL LOW (ref >59)
GFR calc non Af Amer: 43 mL/min/{1.73_m2} — ABNORMAL LOW (ref >59)
GLUCOSE: 95 mg/dL (ref 65–99)
Potassium: 4.2 mmol/L (ref 3.5–5.2)
Sodium: 145 mmol/L — ABNORMAL HIGH (ref 134–144)

## 2017-05-12 ENCOUNTER — Encounter: Payer: Self-pay | Admitting: Physician Assistant

## 2017-05-18 ENCOUNTER — Ambulatory Visit: Payer: Self-pay | Admitting: Surgery

## 2017-05-18 ENCOUNTER — Telehealth: Payer: Self-pay

## 2017-05-18 DIAGNOSIS — K4090 Unilateral inguinal hernia, without obstruction or gangrene, not specified as recurrent: Secondary | ICD-10-CM | POA: Diagnosis not present

## 2017-05-18 DIAGNOSIS — Z9889 Other specified postprocedural states: Secondary | ICD-10-CM | POA: Diagnosis not present

## 2017-05-18 DIAGNOSIS — Z8719 Personal history of other diseases of the digestive system: Secondary | ICD-10-CM | POA: Diagnosis not present

## 2017-05-18 DIAGNOSIS — Z01818 Encounter for other preprocedural examination: Secondary | ICD-10-CM | POA: Diagnosis not present

## 2017-05-18 NOTE — Telephone Encounter (Signed)
   [facility] Pre-operative Risk Assessment    Request for surgical clearance:  1. What type of surgery is being performed? Right Inguinal hernia repair  2. When is this surgery scheduled? TBD  3. What type of clearance is required (medical clearance vs. Pharmacy clearance to hold med vs. Both)? Medical Clearance  4. Are there any medications that need to be held prior to surgery and how long? None  5. Practice name and name of physician performing surgery? [facility] ,   6. What is your office phone and fax number? Phone: [phone] Fax: [phone]  7. Anesthesia type (None, local, MAC, general) ? General Anesthesia    Mendel Ryder 05/18/2017, 3:03 PM  _________________________________________________________________   (provider comments below)

## 2017-05-18 NOTE — Telephone Encounter (Signed)
Forwarded to requesting party via EPIC 

## 2017-05-18 NOTE — Telephone Encounter (Signed)
   Primary Cardiologist: [name], MD  Chart reviewed as part of pre-operative protocol coverage. Given past medical history and time since last visit, based on ACC/AHA guidelines, Carlos Gibson would be at acceptable risk for the planned procedure without further cardiovascular testing.   Due to CHF diagnosis, it is best to monitor fluid balance to prevent decompensation. Cardiologsy will be available peri-operatively if needded  I will route this recommendation to the requesting party via Epic fax function and remove from pre-op pool.  Jory Sims DNP, ANP, AACC   Please call with questions.  05/18/2017, 3:23 PM

## 2017-06-04 HISTORY — PX: HERNIA REPAIR: SHX51

## 2017-06-25 DIAGNOSIS — K4020 Bilateral inguinal hernia, without obstruction or gangrene, not specified as recurrent: Secondary | ICD-10-CM | POA: Diagnosis not present

## 2017-07-19 ENCOUNTER — Encounter: Payer: Self-pay | Admitting: Cardiology

## 2017-07-19 ENCOUNTER — Ambulatory Visit (INDEPENDENT_AMBULATORY_CARE_PROVIDER_SITE_OTHER): Payer: Medicare Other | Admitting: Cardiology

## 2017-07-19 VITALS — BP 130/62 | HR 64 | Ht 71.0 in | Wt 168.8 lb

## 2017-07-19 DIAGNOSIS — I351 Nonrheumatic aortic (valve) insufficiency: Secondary | ICD-10-CM | POA: Diagnosis not present

## 2017-07-19 DIAGNOSIS — I5022 Chronic systolic (congestive) heart failure: Secondary | ICD-10-CM | POA: Diagnosis not present

## 2017-07-19 DIAGNOSIS — I428 Other cardiomyopathies: Secondary | ICD-10-CM

## 2017-07-19 MED ORDER — SACUBITRIL-VALSARTAN 24-26 MG PO TABS: 1.0000 | ORAL_TABLET | Freq: Two times a day (BID) | ORAL | 11 refills | Status: DC

## 2017-07-19 NOTE — Progress Notes (Signed)
Date:  07/19/2017   ID:  Carlos Gibson, DOB Jun 29, 1940, MRN [id number]  PCP:  [name], MD  Cardiologist:   [name], MD     History of Present Illness: Carlos Gibson is a 77 y.o. male with nonischemic cardiomyopathy, dilated cardiomyopathy ejection fraction 25-35% range here for follow-up.  Had cardiac catheterization which showed normal coronary arteries, nonischemic cardiomyopathy, ejection fraction in the 25% range. His left ventricular end-diastolic pressure was also elevated in the mid 20s. Pulmonary pressures were moderately elevated.  On original presentation, his ejection fraction was found to be 40% in July 2016. 8 pounds fluid gain was noted. BNP was 1410. He had improvements after Lasix administration according to Dr. Felipa Eth previously. Lisinopril was tried but ACE inhibitor cough was produced.  Back in 2014 his ejection fraction was low normal at 50-55%, aortic root was 4 cm. Has a history of PVCs. Previously worked at [employer].  He was taking 60 mg of Lasix daily which is an increase from his 40 mg without any significant relief. He denies eating excessive salt. He also denies any excessive fluid intake.  He states that he has not tolerated carvedilol, Toprol and every time he takes bisoprolol his ankles swell and he feels short of breath. I encouraged him to continue to try the bisoprolol.  We also started Entresto. Cost was quite high.  Right now, he states that he is not having any shortness of breath.   07/19/17-now he says that the Delene Loll is more affordable according to his insurance agent.  He wants to try once again.  We will need to give him a new prescription.  He denies any significant shortness of breath orthopnea PND.  His weight is down about 10 pounds.  Doing much better from a fluid perspective.  He was very appreciative today.     Past Medical History:  Diagnosis Date  . Aortic regurgitation    a. mild AI by echo 01/2016.  [name] Aortic  root dilation (HCC)   . Aortic valve regurgitation   . Bifascicular block   . Chronic systolic CHF (congestive heart failure) ([place])   . CKD (chronic kidney disease), stage III ([place]) 01/01/2016  . Diabetes ([place])   . Ejection fraction < 50%   . Frequent PVCs   . Gilbert's syndrome   . Hyperlipidemia   . Mild diastolic dysfunction   . Mitral regurgitation    a. mod by echo 10/207.  [name] NICM (nonischemic cardiomyopathy) ([place])   . Pericardial effusion    a. small by echo 01/2016.  . Pulmonary hypertension ([place])   . Right BBB/left post fasc block   . Thrombocytopenia ([place])     Past Surgical History:  Procedure Laterality Date  . CARDIAC CATHETERIZATION N/A 12/20/2014   Procedure: Right/Left Heart Cath and Coronary Angiography;  Surgeon: Jerline Pain, MD;  Location: [facility];  Service: Cardiovascular;  Laterality: N/A;     Current Outpatient Medications  Medication Sig Dispense Refill  . alfuzosin (UROXATRAL) 10 MG 24 hr tablet Take 10 mg by mouth daily.  3  . aspirin EC 81 MG tablet Take 81 mg by mouth daily.    . bisoprolol (ZEBETA) 5 MG tablet Take 1 tablet (5 mg total) by mouth daily. 90 tablet 3  . cholecalciferol (VITAMIN D) 1000 units tablet Take 1,000 Units by mouth every other day.    . finasteride (PROSCAR) 5 MG tablet Take 5 mg by mouth daily.  2  . furosemide (LASIX) 40 MG tablet Take 2 tablets in the morning and 1 tablet at night 45 tablet 11  . glimepiride (AMARYL) 4 MG tablet Take one tablet in the mornings and one-half tablet at night by mouth    . potassium chloride (K-DUR,KLOR-CON) 10 MEQ tablet Take 4 tablets (40 mEq total) by mouth daily. 120 tablet 11  . sacubitril-valsartan (ENTRESTO) 24-26 MG Take 1 tablet by mouth 2 (two) times daily. 60 tablet 11  . spironolactone (ALDACTONE) 25 MG tablet Take 0.5 tablets (12.5 mg total) by mouth daily. 30 tablet 11   No current facility-administered medications for this visit.     Allergies:   Ace inhibitors;  Coreg [carvedilol]; and Toprol xl [metoprolol tartrate] cough   Social History:  The patient  reports that he has never smoked. He has never used smokeless tobacco. He reports that he does not drink alcohol or use drugs.   Family History:  The patient's family history includes Hypertension in his mother; Irregular heart beat in his father; Stroke in his mother; Unexplained death in his father.    ROS:  Please see the history of present illness.   Otherwise, review of systems are positive for none.   All other systems are reviewed and negative.    PHYSICAL EXAM: VS:  BP 130/62   Pulse 64   Ht 5\' 11"  (1.803 m)   Wt 168 lb 12.8 oz (76.6 kg)   BMI 23.54 kg/m  , BMI Body mass index is 23.54 kg/m. GEN: Well nourished, well developed, in no acute distress  HEENT: normal  Neck: no JVD, carotid bruits, or masses Cardiac: RRR; 1/6 diastolic murmur, no rubs, or gallops,no edema  Respiratory:  clear to auscultation bilaterally, normal work of breathing GI: soft, nontender, nondistended, + BS MS: no deformity or atrophy  Skin: warm and dry, no rash Neuro:  Alert and Oriented x 3, Strength and sensation are intact Psych: euthymic mood, full affect    EKG:  12/31/15-sinus rhythm with first-degree AV block PR interval 220 ms, right bundle branch block heart rate 90 with left anterior fascicular block, bifascicular block.  CATH: 12/20/14  There is severe left ventricular systolic dysfunction.  Ejection fraction between 25-30% with global hypokinesis  Moderately elevated pulmonary pressures-mean PA pressure 35 mmHg consistent with secondary pulmonary hypertension as a result of increased left ventricular end-diastolic pressure  No angiographically significant coronary artery disease present.  Nonischemic cardiomyopathy. Left ventricular ejection fraction 25-30%. Left ventricular end-diastolic pressure 24 mmHg. Cardiac output 3.7 L/m with cardiac index of 1.8.   Recent Labs: 05/10/2017: BUN  20; Creatinine, Ser 1.56; Potassium 4.2; Sodium 145    Lipid Panel No results found for: CHOL, TRIG, HDL, CHOLHDL, VLDL, LDLCALC, LDLDIRECT    Wt Readings from Last 3 Encounters:  07/19/17 168 lb 12.8 oz (76.6 kg)  05/10/17 166 lb (75.3 kg)  04/09/17 185 lb (83.9 kg)      Other studies Reviewed: Additional studies/ records that were reviewed today include: Lab work, notes, cath report  Echocardiogram 05/20/15: - Left ventricle: The cavity size was mildly dilated. Wall thickness was increased in a pattern of mild LVH. Systolicfunction was moderately reduced. The estimated ejection fractionwas in the range of 35% to 40%. - Aortic valve: There was mild regurgitation. - Mitral valve: MR is eccentric, directed along posterior wall ofleft atrium. Calcified annulus. Moderately thickened leaflets . There was mild regurgitation. - Left atrium: The atrium was severely dilated. - Pulmonary arteries: PA peak  pressure: 56 mm Hg (S). Review of the above records demonstrates: As above   ASSESSMENT AND PLAN:  Chronic systolic heart failure/dilated cardiomyopathy/nonischemic cardiomyopathy  - Will restart Entresto 24/26 mg.  He states that Delene Loll will now be more affordable through his insurance. Weight was 177, 169 now. Better. No symptoms.  He occasionally will take an extra Lasix if necessary. Normally he takes 60 mg a day but will take 80 if necessary.  - EF 35%.   Secondary pulmonary hypertension  - Moderate, a result of left ventricular systolic dysfunction.  - Treat left-sided heart disease. Stable.  Mild to Moderate aortic regurgitation  - We will monitor clinically. Doing well. Hear murmur  Right bundle branch block, first agree AV block, left anterior fascicular block  - Stable on EKG. no changes, no syncope. No changes  Dilated aortic root  - Continue to monitor, overall stable. Treat BP  Chronic kidney disease stage III  - Creatinine in the 1.5-1.6 range.  -  Stable.  We will have him see APP in 3 months, 6 with me.  Signed, [name], MD  07/19/2017 4:20 PM    [facility] [place], [place], [place]  [address] Phone: [phone]; Fax: [phone]

## 2017-07-19 NOTE — Patient Instructions (Signed)
Medication Instructions:  Please discontinue your Losartan and start Entresto 24/26 mg twice daily. Continue all other medications as listed.  Follow-Up: Follow up in 3 months with Truitt Merle, NP.  Follow up in 6 months with Dr. Marlou Porch.  You will receive a letter in the mail 2 months before you are due.  Please call us when you receive this letter to schedule your follow up appointment.  If you need a refill on your cardiac medications before your next appointment, please call your pharmacy.  Thank you for choosing [facility]!!

## 2017-07-28 DIAGNOSIS — E119 Type 2 diabetes mellitus without complications: Secondary | ICD-10-CM | POA: Diagnosis not present

## 2017-08-13 DIAGNOSIS — E7800 Pure hypercholesterolemia, unspecified: Secondary | ICD-10-CM | POA: Diagnosis not present

## 2017-09-18 ENCOUNTER — Other Ambulatory Visit: Payer: Self-pay | Admitting: Physician Assistant

## 2017-10-18 ENCOUNTER — Ambulatory Visit: Payer: Medicare Other | Admitting: Nurse Practitioner

## 2017-11-01 ENCOUNTER — Ambulatory Visit (INDEPENDENT_AMBULATORY_CARE_PROVIDER_SITE_OTHER): Payer: Medicare Other | Admitting: Nurse Practitioner

## 2017-11-01 ENCOUNTER — Encounter (INDEPENDENT_AMBULATORY_CARE_PROVIDER_SITE_OTHER): Payer: Self-pay

## 2017-11-01 ENCOUNTER — Encounter: Payer: Self-pay | Admitting: Nurse Practitioner

## 2017-11-01 VITALS — BP 120/66 | HR 60 | Ht 71.0 in | Wt 181.1 lb

## 2017-11-01 DIAGNOSIS — Z0000 Encounter for general adult medical examination without abnormal findings: Secondary | ICD-10-CM | POA: Diagnosis not present

## 2017-11-01 DIAGNOSIS — I5020 Unspecified systolic (congestive) heart failure: Secondary | ICD-10-CM | POA: Diagnosis not present

## 2017-11-01 DIAGNOSIS — I428 Other cardiomyopathies: Secondary | ICD-10-CM

## 2017-11-01 DIAGNOSIS — D696 Thrombocytopenia, unspecified: Secondary | ICD-10-CM | POA: Diagnosis not present

## 2017-11-01 DIAGNOSIS — E119 Type 2 diabetes mellitus without complications: Secondary | ICD-10-CM | POA: Diagnosis not present

## 2017-11-01 DIAGNOSIS — I5022 Chronic systolic (congestive) heart failure: Secondary | ICD-10-CM

## 2017-11-01 DIAGNOSIS — Z1389 Encounter for screening for other disorder: Secondary | ICD-10-CM | POA: Diagnosis not present

## 2017-11-01 DIAGNOSIS — Z79899 Other long term (current) drug therapy: Secondary | ICD-10-CM | POA: Diagnosis not present

## 2017-11-01 DIAGNOSIS — I10 Essential (primary) hypertension: Secondary | ICD-10-CM | POA: Diagnosis not present

## 2017-11-01 DIAGNOSIS — Z7984 Long term (current) use of oral hypoglycemic drugs: Secondary | ICD-10-CM | POA: Diagnosis not present

## 2017-11-01 DIAGNOSIS — Z7189 Other specified counseling: Secondary | ICD-10-CM | POA: Diagnosis not present

## 2017-11-01 NOTE — Patient Instructions (Addendum)
We will be checking the following labs today - BNP  Medication Instructions:    Continue with your current medicines.     Testing/Procedures To Be Arranged:  N/A  Follow-Up:   See Dr. Marlou Porch in about 4 weeks for a recheck - September 3rd at 2:40PM    Other Special Instructions:   N/A    If you need a refill on your cardiac medications before your next appointment, please call your pharmacy.   Call the [place] office at [phone] if you have any questions, problems or concerns.

## 2017-11-01 NOTE — Progress Notes (Addendum)
CARDIOLOGY OFFICE NOTE  Date:  11/01/2017    Carlos Gibson Date of Birth: [birth year] Medical Record #[id number]  PCP:  [name], MD  Cardiologist:  [facility]    Chief Complaint  Patient presents with  . Congestive Heart Failure    Follow up visit - seen for Dr. Marlou Porch    History of Present Illness: Carlos Gibson is a 77 y.o. male who presents today for a 3 month check. Seen for Dr. Marlou Porch.   He has a history of NICM with an EF in the 25 to 35% range. Prior cardiac cath showed normal coronaries. His EF has fluctuated.   He has not been able to tolerate ACE due to cough. He has had issues with shortness of breath with beta blocker. He was initially not able to afford Entresto.   Last seen back in April and seemed to be doing ok. Last echo was from January and showed EF back down.   Comes in today. Here alone. He is no longer on the Entresto - now on Losartan but he tells me that Dr. Felipa Eth is "working on this for me". He has been on vacation last week to [place] - forgot his medicines - unclear how much of his medicines but certainly the Lasix. Was only taking 40 mg but since he got home a few days ago - back to 80 mg and reports good diuresis. Unclear how much extra salt he had.  He does not weigh regularly at home. He says he "could feel that I was off my medicines" but now ok. Less swelling. Not short of breath. No chest pain noted. He is not dizzy or lightheaded. He does admit "I like to eat".   Past Medical History:  Diagnosis Date  . Aortic regurgitation    a. mild AI by echo 01/2016.  [name] Aortic root dilation (HCC)   . Aortic valve regurgitation   . Bifascicular block   . Chronic systolic CHF (congestive heart failure) ([place])   . CKD (chronic kidney disease), stage III ([place]) 01/01/2016  . Diabetes ([place])   . Ejection fraction < 50%   . Frequent PVCs   . Gilbert's syndrome   . Hyperlipidemia   . Mild diastolic dysfunction   . Mitral regurgitation    a. mod by  echo 10/207.  [name] NICM (nonischemic cardiomyopathy) ([place])   . Pericardial effusion    a. small by echo 01/2016.  . Pulmonary hypertension ([place])   . Right BBB/left post fasc block   . Thrombocytopenia ([place])     Past Surgical History:  Procedure Laterality Date  . CARDIAC CATHETERIZATION N/A 12/20/2014   Procedure: Right/Left Heart Cath and Coronary Angiography;  Surgeon: Jerline Pain, MD;  Location: [facility];  Service: Cardiovascular;  Laterality: N/A;     Medications: Current Meds  Medication Sig  . alfuzosin (UROXATRAL) 10 MG 24 hr tablet Take 10 mg by mouth daily.  [name] aspirin EC 81 MG tablet Take 81 mg by mouth daily.  . bisoprolol (ZEBETA) 5 MG tablet TAKE 1 TABLET BY MOUTH EVERY DAY  . cholecalciferol (VITAMIN D) 1000 units tablet Take 1,000 Units by mouth every other day.  . finasteride (PROSCAR) 5 MG tablet Take 5 mg by mouth daily.   . furosemide (LASIX) 40 MG tablet Take 2 tablets in the morning and 1 tablet at night  . glimepiride (AMARYL) 4 MG tablet Take one tablet in the mornings and one-half tablet at night  by mouth  . losartan (COZAAR) 25 MG tablet Take 25 mg by mouth daily.  . potassium chloride (K-DUR,KLOR-CON) 10 MEQ tablet Take 4 tablets (40 mEq total) by mouth daily.  . [DISCONTINUED] sacubitril-valsartan (ENTRESTO) 24-26 MG Take 1 tablet by mouth 2 (two) times daily.     Allergies: Allergies  Allergen Reactions  . Ace Inhibitors Cough  . Coreg [Carvedilol] Other (See Comments)    "feels bad"  . Toprol Xl [Metoprolol Tartrate] Other (See Comments)    "feels bad"    Social History: The patient  reports that he has never smoked. He has never used smokeless tobacco. He reports that he does not drink alcohol or use drugs.   Family History: The patient's family history includes Hypertension in his mother; Irregular heart beat in his father; Stroke in his mother; Unexplained death in his father.   Review of Systems: Please see the history of  present illness.   Otherwise, the review of systems is positive for none.   All other systems are reviewed and negative.   Physical Exam: VS:  BP 120/66   Pulse 60   Ht 5\' 11"  (1.803 m)   Wt 181 lb 1.9 oz (82.2 kg)   BMI 25.26 kg/m  .  BMI Body mass index is 25.26 kg/m.  Wt Readings from Last 3 Encounters:  11/01/17 181 lb 1.9 oz (82.2 kg)  07/19/17 168 lb 12.8 oz (76.6 kg)  05/10/17 166 lb (75.3 kg)    General: Pleasant. Well developed, well nourished and in no acute distress. Hs weight is up 13 pounds since last visit here.   HEENT: Normal.  Neck: Supple, no JVD, carotid bruits, or masses noted.  Cardiac: Regular rate and rhythm. Split S2 noted. Trace edema.  Respiratory:  Lungs are clear to auscultation bilaterally with normal work of breathing.  GI: Soft and nontender.  MS: No deformity or atrophy. Gait and ROM intact.  Skin: Warm and dry. Color is normal.  Neuro:  Strength and sensation are intact and no gross focal deficits noted.  Psych: Alert, appropriate and with normal affect.   LABORATORY DATA:  EKG:  EKG is not ordered today.  Lab Results  Component Value Date   WBC 4.6 12/31/2015   HGB 13.1 12/31/2015   HCT 39.7 12/31/2015   PLT 145 (L) 12/31/2015   GLUCOSE 95 05/10/2017   ALT 53 12/31/2015   AST 34 12/31/2015   NA 145 (H) 05/10/2017   K 4.2 05/10/2017   CL 102 05/10/2017   CREATININE 1.56 (H) 05/10/2017   BUN 20 05/10/2017   CO2 25 05/10/2017   TSH 2.64 12/24/2014   INR 1.1 (H) 12/13/2014       BNP (last 3 results) No results for input(s): BNP in the last 8760 hours.  ProBNP (last 3 results) No results for input(s): PROBNP in the last 8760 hours.   Other Studies Reviewed Today:  Echo Study Conclusions 04/2017  - Left ventricle: The cavity size was severely dilated. There was   mild concentric hypertrophy. Systolic function was severely   reduced. The estimated ejection fraction was in the range of 25%   to 30%. Severe diffuse  hypokinesis with regional variations. - Aortic valve: There was moderate regurgitation directed   eccentrically in the LVOT and towards the mitral anterior   leaflet. Regurgitation pressure half-time: 300 ms. - Aorta: Aortic root dimension: 40 mm (ED). - Aortic root: The aortic root was mildly dilated. - Mitral valve: There was moderate regurgitation  directed   eccentrically and posteriorly. - Left atrium: The atrium was severely dilated. - Right ventricle: The cavity size was moderately dilated. Wall   thickness was normal. Systolic function was moderately to   severely reduced. - Tricuspid valve: There was mild regurgitation. - Pulmonic valve: There was moderate regurgitation. - Pulmonary arteries: PA peak pressure: 38 mm Hg (S). - Pericardium, extracardiac: A trivial, free-flowing pericardial   effusion was identified posterior to the heart and along the   right atrial free wall.  Impressions:  - The right ventricular systolic pressure was increased consistent   with mild pulmonary hypertension.    CATH: 12/20/14  There is severe left ventricular systolic dysfunction.  Ejection fraction between 25-30% with global hypokinesis  Moderately elevated pulmonary pressures-mean PA pressure 35 mmHg consistent with secondary pulmonary hypertension as a result of increased left ventricular end-diastolic pressure  No angiographically significant coronary artery disease present.  Nonischemic cardiomyopathy. Left ventricular ejection fraction 25-30%. Left ventricular end-diastolic pressure 24 mmHg. Cardiac output 3.7 L/m with cardiac index of 1.8.   Assessment/Plan:  1. NICM with chronic systolic HF - back on ARB due to the cost of Entresto - may be able to restart - his PCP is looking into this for him. His weight is up - he has been out of his medicines - just restarted - had BMET with PCP today - I will check BNP - currently doing 80 mg of Lasix. We may need further adjustment. ?  If also need to consider cardiac MRI for definitive EF and possible referral to EP - will ask for Dr. Marlou Porch to review. Can readdress on follow up.   2. Secondary pulmonary HTN - back on medicines. No real symptoms at this time.   3. Valvular heart disease - moderate aortic regurgitation from echo in January noted.   4. CKD - has had labs today with Dr. Felipa Eth.  5. Dilated aortic root   Current medicines are reviewed with the patient today.  The patient does not have concerns regarding medicines other than what has been noted above.  The following changes have been made:  See above.  Labs/ tests ordered today include:    Orders Placed This Encounter  Procedures  . Pro b natriuretic peptide (BNP)     Disposition:   FU with [place] in about a month to make sure he is back on track.   Patient is agreeable to this plan and will call if any problems develop in the interim.   SignedTruitt Merle, NP  11/01/2017 3:29 PM  [facility] [address] [place], [place]  [address] Phone: [phone] Fax: [phone]

## 2017-11-02 ENCOUNTER — Other Ambulatory Visit: Payer: Self-pay | Admitting: *Deleted

## 2017-11-02 DIAGNOSIS — N183 Chronic kidney disease, stage 3 unspecified: Secondary | ICD-10-CM

## 2017-11-02 DIAGNOSIS — I5023 Acute on chronic systolic (congestive) heart failure: Secondary | ICD-10-CM

## 2017-11-02 LAB — PRO B NATRIURETIC PEPTIDE: NT-Pro BNP: 10037 pg/mL — ABNORMAL HIGH (ref 0–486)

## 2017-11-02 MED ORDER — FUROSEMIDE 40 MG PO TABS: ORAL_TABLET | ORAL | 11 refills | Status: DC

## 2017-11-09 ENCOUNTER — Other Ambulatory Visit: Payer: Medicare Other

## 2017-11-09 DIAGNOSIS — N183 Chronic kidney disease, stage 3 unspecified: Secondary | ICD-10-CM

## 2017-11-09 DIAGNOSIS — I5023 Acute on chronic systolic (congestive) heart failure: Secondary | ICD-10-CM

## 2017-11-09 LAB — BASIC METABOLIC PANEL
BUN/Creatinine Ratio: 13 (ref 10–24)
BUN: 21 mg/dL (ref 8–27)
CO2: 23 mmol/L (ref 20–29)
Calcium: 9.7 mg/dL (ref 8.6–10.2)
Chloride: 104 mmol/L (ref 96–106)
Creatinine, Ser: 1.67 mg/dL — ABNORMAL HIGH (ref 0.76–1.27)
GFR calc Af Amer: 45 mL/min/{1.73_m2} — ABNORMAL LOW (ref >59)
GFR calc non Af Amer: 39 mL/min/{1.73_m2} — ABNORMAL LOW (ref >59)
Glucose: 105 mg/dL — ABNORMAL HIGH (ref 65–99)
Potassium: 4.6 mmol/L (ref 3.5–5.2)
Sodium: 143 mmol/L (ref 134–144)

## 2017-11-09 LAB — PRO B NATRIURETIC PEPTIDE: NT-Pro BNP: 3222 pg/mL — ABNORMAL HIGH (ref 0–486)

## 2017-11-10 ENCOUNTER — Other Ambulatory Visit: Payer: Self-pay | Admitting: *Deleted

## 2017-11-10 ENCOUNTER — Other Ambulatory Visit: Payer: Self-pay | Admitting: Nurse Practitioner

## 2017-11-10 DIAGNOSIS — I428 Other cardiomyopathies: Secondary | ICD-10-CM

## 2017-11-10 DIAGNOSIS — I5023 Acute on chronic systolic (congestive) heart failure: Secondary | ICD-10-CM

## 2017-11-10 DIAGNOSIS — I5022 Chronic systolic (congestive) heart failure: Secondary | ICD-10-CM

## 2017-11-26 ENCOUNTER — Telehealth: Payer: Self-pay | Admitting: Cardiology

## 2017-11-26 NOTE — Telephone Encounter (Signed)
Patients phone was not set up for voicemail so could not leave a message.  I left a VM with wife's phone to call back and let me know the time of day he wants his cardiac MRI.

## 2017-12-07 ENCOUNTER — Ambulatory Visit (INDEPENDENT_AMBULATORY_CARE_PROVIDER_SITE_OTHER): Payer: Medicare Other | Admitting: Cardiology

## 2017-12-07 ENCOUNTER — Encounter: Payer: Self-pay | Admitting: Cardiology

## 2017-12-07 VITALS — BP 126/60 | HR 53 | Ht 71.0 in | Wt 175.8 lb

## 2017-12-07 DIAGNOSIS — I77810 Thoracic aortic ectasia: Secondary | ICD-10-CM | POA: Diagnosis not present

## 2017-12-07 DIAGNOSIS — I428 Other cardiomyopathies: Secondary | ICD-10-CM | POA: Diagnosis not present

## 2017-12-07 DIAGNOSIS — E119 Type 2 diabetes mellitus without complications: Secondary | ICD-10-CM

## 2017-12-07 DIAGNOSIS — E785 Hyperlipidemia, unspecified: Secondary | ICD-10-CM | POA: Diagnosis not present

## 2017-12-07 MED ORDER — POTASSIUM CHLORIDE CRYS ER 10 MEQ PO TBCR: 20.0000 meq | EXTENDED_RELEASE_TABLET | Freq: Every day | ORAL | 11 refills | Status: DC

## 2017-12-07 NOTE — Progress Notes (Signed)
Date:  12/07/2017   ID:  [name], DOB March 05, 1941, MRN [id number]  PCP:  [name], MD  Cardiologist:   [name], MD     History of Present Illness: Carlos Gibson is a 77 y.o. male with nonischemic cardiomyopathy, dilated cardiomyopathy ejection fraction 25-35% range here for follow-up.  Had cardiac catheterization which showed normal coronary arteries, nonischemic cardiomyopathy, ejection fraction in the 25% range. His left ventricular end-diastolic pressure was also elevated in the mid 20s. Pulmonary pressures were moderately elevated.  On original presentation, his ejection fraction was found to be 40% in July 2016. 8 pounds fluid gain was noted. BNP was 1410. He had improvements after Lasix administration according to Dr. Felipa Eth previously. Lisinopril was tried but ACE inhibitor cough was produced.  Back in 2014 his ejection fraction was low normal at 50-55%, aortic root was 4 cm. Has a history of PVCs. Previously worked at [employer].  He was taking 60 mg of Lasix daily which is an increase from his 40 mg without any significant relief. He denies eating excessive salt. He also denies any excessive fluid intake.  He states that he has not tolerated carvedilol, Toprol and every time he takes bisoprolol his ankles swell and he feels short of breath. I encouraged him to continue to try the bisoprolol.  We also started Entresto. Cost was quite high.  Right now, he states that he is not having any shortness of breath.   07/19/17-now he says that the Delene Loll is more affordable according to his insurance agent.  He wants to try once again.  We will need to give him a new prescription.  He denies any significant shortness of breath orthopnea PND.  His weight is down about 10 pounds.  Doing much better from a fluid perspective.  He was very appreciative today.  12/07/17 - palps   Past Medical History:  Diagnosis Date  . Aortic regurgitation    a. mild AI by echo 01/2016.    [name] Aortic root dilation (HCC)   . Aortic valve regurgitation   . Bifascicular block   . Chronic systolic CHF (congestive heart failure) ([place])   . CKD (chronic kidney disease), stage III ([place]) 01/01/2016  . Diabetes ([place])   . Ejection fraction < 50%   . Frequent PVCs   . Gilbert's syndrome   . Hyperlipidemia   . Mild diastolic dysfunction   . Mitral regurgitation    a. mod by echo 10/207.  [name] NICM (nonischemic cardiomyopathy) ([place])   . Pericardial effusion    a. small by echo 01/2016.  . Pulmonary hypertension ([place])   . Right BBB/left post fasc block   . Thrombocytopenia ([place])     Past Surgical History:  Procedure Laterality Date  . CARDIAC CATHETERIZATION N/A 12/20/2014   Procedure: Right/Left Heart Cath and Coronary Angiography;  Surgeon: Jerline Pain, MD;  Location: [facility];  Service: Cardiovascular;  Laterality: N/A;     Current Outpatient Medications  Medication Sig Dispense Refill  . alfuzosin (UROXATRAL) 10 MG 24 hr tablet Take 10 mg by mouth daily.  3  . aspirin EC 81 MG tablet Take 81 mg by mouth daily.    . bisoprolol (ZEBETA) 5 MG tablet TAKE 1 TABLET BY MOUTH EVERY DAY 90 tablet 3  . cholecalciferol (VITAMIN D) 1000 units tablet Take 1,000 Units by mouth every other day.    . finasteride (PROSCAR) 5 MG tablet Take 5 mg by mouth daily.  2  . furosemide (LASIX) 40 MG tablet Take 2 tablets in the morning 45 tablet 11  . glimepiride (AMARYL) 4 MG tablet Take one tablet in the mornings and one-half tablet at night by mouth    . losartan (COZAAR) 25 MG tablet Take 25 mg by mouth daily.  3  . potassium chloride (K-DUR,KLOR-CON) 10 MEQ tablet Take 4 tablets (40 mEq total) by mouth daily. 120 tablet 11  . spironolactone (ALDACTONE) 25 MG tablet Take 0.5 tablets (12.5 mg total) by mouth daily. 30 tablet 11   No current facility-administered medications for this visit.     Allergies:   Ace inhibitors; Coreg [carvedilol]; and Toprol xl [metoprolol tartrate]  cough   Social History:  The patient  reports that he has never smoked. He has never used smokeless tobacco. He reports that he does not drink alcohol or use drugs.   Family History:  The patient's family history includes Hypertension in his mother; Irregular heart beat in his father; Stroke in his mother; Unexplained death in his father.    ROS:  Please see the history of present illness.     PHYSICAL EXAM: VS:  BP 126/60   Pulse (!) 53   Ht 5\' 11"  (1.803 m)   Wt 175 lb 12.8 oz (79.7 kg)   SpO2 97%   BMI 24.52 kg/m  , BMI Body mass index is 24.52 kg/m. GEN: Well nourished, well developed, in no acute distress  HEENT: normal  Neck: no JVD, carotid bruits, or masses Cardiac: RRR; 1/6 DM, rubs, S3 gallop,no edema  Respiratory:  clear to auscultation bilaterally, normal work of breathing GI: soft, nontender, nondistended, + BS MS: no deformity or atrophy  Skin: warm and dry, no rash Neuro:  Alert and Oriented x 3, Strength and sensation are intact Psych: euthymic mood, full affect    EKG:  12/31/15-sinus rhythm with first-degree AV block PR interval 220 ms, right bundle branch block heart rate 90 with left anterior fascicular block, bifascicular block.  CATH: 12/20/14  There is severe left ventricular systolic dysfunction.  Ejection fraction between 25-30% with global hypokinesis  Moderately elevated pulmonary pressures-mean PA pressure 35 mmHg consistent with secondary pulmonary hypertension as a result of increased left ventricular end-diastolic pressure  No angiographically significant coronary artery disease present.  Nonischemic cardiomyopathy. Left ventricular ejection fraction 25-30%. Left ventricular end-diastolic pressure 24 mmHg. Cardiac output 3.7 L/m with cardiac index of 1.8.   Recent Labs: 11/09/2017: BUN 21; Creatinine, Ser 1.67; NT-Pro BNP 3,222; Potassium 4.6; Sodium 143    Lipid Panel No results found for: CHOL, TRIG, HDL, CHOLHDL, VLDL, LDLCALC,  LDLDIRECT    Wt Readings from Last 3 Encounters:  12/07/17 175 lb 12.8 oz (79.7 kg)  11/01/17 181 lb 1.9 oz (82.2 kg)  07/19/17 168 lb 12.8 oz (76.6 kg)      Other studies Reviewed: Additional studies/ records that were reviewed today include: Lab work, notes, cath report  Echocardiogram 05/20/15: - Left ventricle: The cavity size was mildly dilated. Wall thickness was increased in a pattern of mild LVH. Systolicfunction was moderately reduced. The estimated ejection fractionwas in the range of 35% to 40%. - Aortic valve: There was mild regurgitation. - Mitral valve: MR is eccentric, directed along posterior wall ofleft atrium. Calcified annulus. Moderately thickened leaflets . There was mild regurgitation. - Left atrium: The atrium was severely dilated. - Pulmonary arteries: PA peak pressure: 56 mm Hg (S). Review of the above records demonstrates: As above   ASSESSMENT  AND PLAN:  Chronic systolic heart failure/dilated cardiomyopathy/nonischemic cardiomyopathy  -He had been back and forth on Entresto 24/26 mg.  Now on losartan again again.  Weight stable now. Better. No symptoms.  He occasionally will take an extra Lasix if necessary. Takes 80 in the morning. Will reduce his potassium to 20 meq QD.   - EF 25-35%.  Cecille Rubin ordered cardiac MRI but it does not look like it was done as of yet.  This was done to further clarify the ejection fraction for possible EP referral.  Secondary pulmonary hypertension  - Moderate, a result of left ventricular systolic dysfunction.  - Treat left-sided heart disease. Stable. No changes  Mild to Moderate aortic regurgitation  - We will monitor clinically. Doing well. Heard murmur  Right bundle branch block, first agree AV block, left anterior fascicular block  - Stable on EKG. no changes, no syncope. No changes  Dilated aortic root  - Continue to monitor, overall stable. Treat BP  Chronic kidney disease stage III  - Creatinine in the  1.5-1.6 range.  - Stable.  We will have him see APP in 3 months, 6 with me.  Signed, [name], MD  12/07/2017 3:20 PM    [facility] [place], [place], [place]  [address] Phone: [phone]; Fax: [phone]

## 2017-12-07 NOTE — Patient Instructions (Signed)
Medication Instructions:  Please decrease your potassium to 20 MEQ a day. Continue all other medications as listed.  Testing/Procedures: Please schedule for your cardiac MRI at checkout.  Follow-Up: Follow up in 6 months with Dr. Truitt Merle, NP.  You will receive a letter in the mail 2 months before you are due.  Please call us when you receive this letter to schedule your follow up appointment.  If you need a refill on your cardiac medications before your next appointment, please call your pharmacy.  Thank you for choosing [facility]!!

## 2017-12-08 ENCOUNTER — Encounter: Payer: Self-pay | Admitting: Cardiology

## 2017-12-10 ENCOUNTER — Telehealth: Payer: Self-pay | Admitting: Nurse Practitioner

## 2017-12-10 ENCOUNTER — Encounter: Payer: Self-pay | Admitting: Nurse Practitioner

## 2017-12-10 NOTE — Telephone Encounter (Signed)
Called the patient to give him appointment information for his cardiac MRI, but, his voicemail box was full so I could not leave a message.  Letter mailed out to the patient today.

## 2017-12-15 ENCOUNTER — Ambulatory Visit (HOSPITAL_COMMUNITY)
Admission: RE | Admit: 2017-12-15 | Discharge: 2017-12-15 | Disposition: A | Discharge status: Home / Self Care | Payer: Medicare Other | Source: Ambulatory Visit | Attending: Nurse Practitioner | Admitting: Nurse Practitioner

## 2017-12-15 DIAGNOSIS — I081 Rheumatic disorders of both mitral and tricuspid valves: Secondary | ICD-10-CM | POA: Diagnosis not present

## 2017-12-15 DIAGNOSIS — I5023 Acute on chronic systolic (congestive) heart failure: Secondary | ICD-10-CM

## 2017-12-15 DIAGNOSIS — I517 Cardiomegaly: Secondary | ICD-10-CM | POA: Insufficient documentation

## 2017-12-15 DIAGNOSIS — I428 Other cardiomyopathies: Secondary | ICD-10-CM | POA: Insufficient documentation

## 2017-12-15 MED ORDER — GADOBUTROL 1 MMOL/ML IV SOLN
12.0000 mL | Freq: Once | INTRAVENOUS | Status: AC | PRN
  Administered 2017-12-15: 12 mL via INTRAVENOUS

## 2017-12-20 ENCOUNTER — Telehealth: Payer: Self-pay | Admitting: Nurse Practitioner

## 2017-12-20 NOTE — Telephone Encounter (Signed)
New Message   Patient is returning call in reference to test results. Please call.

## 2017-12-21 ENCOUNTER — Other Ambulatory Visit: Payer: Self-pay | Admitting: *Deleted

## 2017-12-21 DIAGNOSIS — I519 Heart disease, unspecified: Secondary | ICD-10-CM

## 2017-12-21 DIAGNOSIS — I5022 Chronic systolic (congestive) heart failure: Secondary | ICD-10-CM

## 2017-12-21 NOTE — Progress Notes (Signed)
error 

## 2017-12-23 NOTE — Progress Notes (Signed)
Electrophysiology Office Note   Date:  12/24/2017   ID:  Carlos Gibson, DOB 13-Apr-1940, MRN [id number]  PCP:  [name], MD  Cardiologist:  Marlou Porch Primary Electrophysiologist:  Constance Haw, MD    No chief complaint on file.    History of Present Illness: Carlos Gibson is a 77 y.o. male who presents today for electrophysiology evaluation.   Hx NICM/chronic systolic CHF (LHC 08/2839 with normal cors &EF 25-30%; last echo 05/2015 with EF 35-40%), intolerance to prior Toprol/carvedilol due to "feeling bad," secondary pulmonary hypertension, frequent PVCs, aortic root dilatation, Trifasicular block (RBBB + LAFB + 1st degree AVB), diabetes, Gilbert's syndrome, mild AI/MR by echo 05/2015, hyperlipidemia, thrombocytopenia, probable CKD stage III.  He was put on Entresto.  His ejection fraction has since improved to 40-45%.  Today, denies symptoms of palpitations, chest pain, shortness of breath, orthopnea, PND, lower extremity edema, claudication, dizziness, presyncope, syncope, bleeding, or neurologic sequela. The patient is tolerating medications without difficulties.  He continues to have issues with fatigue and weakness.  Unfortunately, his cardiac MRI showed a drop in his ejection fraction.  It remained low since January.  His legs also get tired when exerting himself such as walking up and down stairs.   Past Medical History:  Diagnosis Date  . Aortic regurgitation    a. mild AI by echo 01/2016.  [name] Aortic root dilation (HCC)   . Aortic valve regurgitation   . Bifascicular block   . Chronic systolic CHF (congestive heart failure) ([place])   . CKD (chronic kidney disease), stage III ([place]) 01/01/2016  . Diabetes ([place])   . Ejection fraction < 50%   . Frequent PVCs   . Gilbert's syndrome   . Hyperlipidemia   . Mild diastolic dysfunction   . Mitral regurgitation    a. mod by echo 10/207.  [name] NICM (nonischemic cardiomyopathy) ([place])   . Pericardial effusion    a. small by echo  01/2016.  . Pulmonary hypertension ([place])   . Right BBB/left post fasc block   . Thrombocytopenia ([place])    Past Surgical History:  Procedure Laterality Date  . CARDIAC CATHETERIZATION N/A 12/20/2014   Procedure: Right/Left Heart Cath and Coronary Angiography;  Surgeon: Jerline Pain, MD;  Location: [facility];  Service: Cardiovascular;  Laterality: N/A;     Current Outpatient Medications  Medication Sig Dispense Refill  . alfuzosin (UROXATRAL) 10 MG 24 hr tablet Take 10 mg by mouth daily.  3  . aspirin EC 81 MG tablet Take 81 mg by mouth daily.    . bisoprolol (ZEBETA) 5 MG tablet TAKE 1 TABLET BY MOUTH EVERY DAY 90 tablet 3  . cholecalciferol (VITAMIN D) 1000 units tablet Take 1,000 Units by mouth every other day.    . finasteride (PROSCAR) 5 MG tablet Take 5 mg by mouth daily.   2  . furosemide (LASIX) 40 MG tablet Take 2 tablets in the morning 45 tablet 11  . glimepiride (AMARYL) 4 MG tablet Take one tablet in the mornings and one-half tablet at night by mouth    . losartan (COZAAR) 25 MG tablet Take 25 mg by mouth daily.  3  . potassium chloride (K-DUR,KLOR-CON) 10 MEQ tablet Take 2 tablets (20 mEq total) by mouth daily. 60 tablet 11  . spironolactone (ALDACTONE) 25 MG tablet Take 0.5 tablets (12.5 mg total) by mouth daily. 30 tablet 11   No current facility-administered medications for this visit.     Allergies:  Ace inhibitors; Coreg [carvedilol]; and Toprol xl [metoprolol tartrate]   Social History:  The patient  reports that he has never smoked. He has never used smokeless tobacco. He reports that he does not drink alcohol or use drugs.   Family History:  The patient's family history includes Hypertension in his mother; Irregular heart beat in his father; Stroke in his mother; Unexplained death in his father.    ROS:  Please see the history of present illness.   Otherwise, review of systems is positive for chest pressure, palpitations, dizziness.   All other systems  are reviewed and negative.   PHYSICAL EXAM: VS:  BP 132/70   Pulse 72   Ht 5\' 11"  (1.803 m)   Wt 181 lb (82.1 kg)   BMI 25.24 kg/m  , BMI Body mass index is 25.24 kg/m. GEN: Well nourished, well developed, in no acute distress  HEENT: normal  Neck: no JVD, carotid bruits, or masses Cardiac: RRR; no murmurs, rubs, or gallops,no edema  Respiratory:  clear to auscultation bilaterally, normal work of breathing GI: soft, nontender, nondistended, + BS MS: no deformity or atrophy  Skin: warm and dry Neuro:  Strength and sensation are intact Psych: euthymic mood, full affect  EKG:  EKG is ordered today. Personal review of the ekg ordered shows atrial flutter, PVCs, right bundle branch block, left anterior fascicular block  Recent Labs: 11/09/2017: BUN 21; Creatinine, Ser 1.67; NT-Pro BNP 3,222; Potassium 4.6; Sodium 143    Lipid Panel  No results found for: CHOL, TRIG, HDL, CHOLHDL, VLDL, LDLCALC, LDLDIRECT   Wt Readings from Last 3 Encounters:  12/24/17 181 lb (82.1 kg)  12/07/17 175 lb 12.8 oz (79.7 kg)  11/01/17 181 lb 1.9 oz (82.2 kg)      Other studies Reviewed: Additional studies/ records that were reviewed today include: CMRI  Review of the above records today demonstrates:  1. Severely dilated left ventricle with normal wall thickness and severely reduced systolic function (LVEF = 34%). There is diffuse hypokinesis.  There is no late gadolinium enhancement in the left ventricular myocardium.  2. Mildly dilated right ventricular size with normal wall thickness and moderately decreased systolic function (LVEF = 32%). There are no regional wall motion abnormalities.  3.  Severe bi-atrial dilatation.  No left atrial thrombus.  4. Normal size of the aortic root, ascending aorta. Dilated pulmonary artery measuring 34 mm.  5. Moderate mitral regurgitation with posteriorly directed jet. Mild to moderate tricuspid regurgitation.  6.  Normal pericardium.  Trivial  pericardial effusion.  ASSESSMENT AND PLAN:  1.  Nonischemic cardiomyopathy: Currently on losartan and bisoprolol.  He is having trouble affording Entresto so we Nanie Dunkleberger work to get him some of this more affordable.  His systolic function remains low.  We Prakriti Carignan thus plan for ICD implant.  Risks and benefits were discussed and include bleeding, infection, tamponade, and pneumothorax.  The patient understands the risks and is agreed to the procedure.  2. PVCs: 1% PVCs on his monitor.  Currently asymptomatic.  3. Hypertension: Well-controlled today  4.  Atrial flutter: Appears to be typical in nature.  He is currently not anticoagulated.  We Shelba Susi start him on Eliquis today.  I discussed with him the possibility of ablation.  We Mackson Botz try to do this on the day of his defibrillator implant.  Risks and benefits were discussed and include bleeding, tamponade, heart block, stroke.  He understands risks and is agreed to procedure.  We Ordean Fouts schedule for 3 to  4 weeks from now due to Eliquis start.  This patients CHA2DS2-VASc Score and unadjusted Ischemic Stroke Rate (% per year) is equal to 4.8 % stroke rate/year from a score of 4  Above score calculated as 1 point each if present [CHF, HTN, DM, Vascular=MI/PAD/Aortic Plaque, Age if 65-74, or Male] Above score calculated as 2 points each if present [Age > 75, or Stroke/TIA/TE]   Current medicines are reviewed at length with the patient today.   The patient does not have concerns regarding his medicines.  The following changes were made today:  Start eliquis  Labs/ tests ordered today include:  Orders Placed This Encounter  Procedures  . EKG 12-Lead   Case discussed with primary cardiology  Disposition:   FU with Braian Tijerina 3 months  Signed, Yu Peggs Meredith Leeds, MD  12/24/2017 9:53 AM     [facility] [address] [place] [facility] [address] [phone] (office) [phone] (fax)

## 2017-12-24 ENCOUNTER — Ambulatory Visit (INDEPENDENT_AMBULATORY_CARE_PROVIDER_SITE_OTHER): Payer: Medicare Other | Admitting: Cardiology

## 2017-12-24 ENCOUNTER — Encounter: Payer: Self-pay | Admitting: Cardiology

## 2017-12-24 VITALS — BP 132/70 | HR 72 | Ht 71.0 in | Wt 181.0 lb

## 2017-12-24 DIAGNOSIS — I4892 Unspecified atrial flutter: Secondary | ICD-10-CM | POA: Diagnosis not present

## 2017-12-24 DIAGNOSIS — I493 Ventricular premature depolarization: Secondary | ICD-10-CM

## 2017-12-24 DIAGNOSIS — I10 Essential (primary) hypertension: Secondary | ICD-10-CM | POA: Diagnosis not present

## 2017-12-24 DIAGNOSIS — I428 Other cardiomyopathies: Secondary | ICD-10-CM | POA: Diagnosis not present

## 2017-12-24 MED ORDER — APIXABAN 5 MG PO TABS: 5.0000 mg | ORAL_TABLET | Freq: Two times a day (BID) | ORAL | 6 refills | Status: DC

## 2017-12-24 MED ORDER — APIXABAN 5 MG PO TABS: 5.0000 mg | ORAL_TABLET | Freq: Two times a day (BID) | ORAL | 0 refills | Status: DC

## 2017-12-24 NOTE — Patient Instructions (Addendum)
Medication Instructions:  Your physician has recommended you make the following change in your medication: 1. START Eliquis 5mg  twice daily     * If you need a refill on your cardiac medications before your next appointment, please call your pharmacy. *   Labwork: None ordered   Testing/Procedures: Your physician has recommended that you have an ablation. Catheter ablation is a medical procedure used to treat some cardiac arrhythmias (irregular heartbeats). During catheter ablation, a long, thin, flexible tube is put into a blood vessel in your groin (upper thigh), or neck. This tube is called an ablation catheter. It is then guided to your heart through the blood vessel. Radio frequency waves destroy small areas of heart tissue where abnormal heartbeats may cause an arrhythmia to start.   Instructions for your ablation: 1. Please arrive at the [facility], Main Entrance "A", [facility] at 6:30 a.m. on 01/26/2018. 2. Do not eat or drink after midnight the night prior to the procedure. 3. Do not miss any doses of ELIQUIS prior to the morning of the procedure.  4. Do not take any medications the morning of the procedure. 5. Both of your groins will need to be shaved for this procedure (if needed). We ask that you do this yourself at home 1-2 days prior to the procedure.  If you are unable/uncomfortable to do yourself, the hospital staff will shave you the day of your procedure (if needed). 6. Plan for an overnight stay in the hospital. 7. You will need someone to drive you home at discharge.   Your physician has recommended that you have a defibrillator inserted. An implantable cardioverter defibrillator (ICD) is a small device that is placed in your chest or, in rare cases, your abdomen. This device uses electrical pulses or shocks to help control life-threatening, irregular heartbeats that could lead the heart to suddenly stop beating (sudden cardiac arrest). Leads are attached to  the ICD that goes into your heart. This is done in the hospital and usually requires an overnight stay. Please follow the instructions below, located under the special instructions section.   Follow-Up: Your physician recommends that you schedule a follow-up appointment in: between 10/8 - 10/15 with Dr. Curt Bears.   Your physician recommends that you schedule a wound check appointment 10-14 days, after your procedure on 01/26/2018, with the device clinic.  Your physician recommends that you schedule a follow up appointment in 91 days, after your procedure on 01/26/2018, with Dr. Curt Bears.  * Please note that any paperwork needing to be filled out by the provider will need to be addressed at the front desk prior to seeing the provider.  Please note that any FMLA, disability or other documents regarding health condition is subject to a $25.00 charge that must be received prior to completion of paperwork in the form of a money order or check. *  Thank you for choosing [facility]!!   Trinidad Curet, RN [phone]   Any Other Special Instructions Will Be Listed Below (If Applicable).     Implantable Device Instructions  You are scheduled for:                  _____ Implantable Cardioverter Defibrillator  on  01/26/2018  with Dr. Curt Bears.  1.   Please arrive at the [facility], Entrance "A"  at [facility] at  6:30 a.m. on the day of your procedure. (The address is [address])  2. Do not eat  or drink after midnight the night before your procedure.  3.   Complete pre procedure  lab work on _______.  The lab at [facility] is open from 8:00 AM to 4:30 PM.  You do not have to be fasting.  4.  Hold all of your morning medications the morning of your procedure.  5.  Plan for an overnight stay.  Bring your insurance cards and a list of you medications.  6.  Wash your chest and neck with surgical scrub the evening before and the morning of  your  procedure.  Rinse well. Please review the surgical scrub instruction sheet given to you.  7. Your chest will need to be shaved prior to this procedure (if needed). We ask that you do this yourself at home 1 to 2 days before or if uncomfortable/unable to do yourself, then it will be performed by the hospital staff the day of.                                                                                                                * If you have ANY questions after you get home, please call Trinidad Curet, RN @ [phone].  * Every attempt is made to prevent procedures from being rescheduled.  Due to the nature of  Electrophysiology, rescheduling can happen.  The physician is always aware and directs the staff when this occurs.    Cardioverter Defibrillator Implantation An implantable cardioverter defibrillator (ICD) is a small, lightweight, battery-powered device that is placed (implanted) under the skin in the chest or abdomen. Your caregiver may prescribe an ICD if:  You have had an irregular heart rhythm (arrhythmia) that originated in the lower chambers of the heart (ventricles).  Your heart has been damaged by a disease (such as coronary artery disease) or heart condition (such as a heart attack). An ICD consists of a battery that lasts several years, a small computer called a pulse generator, and wires called leads that go into the heart. It is used to detect and correct two dangerous arrhythmias: a rapid heart rhythm (tachycardia) and an arrhythmia in which the ventricles contract in an uncoordinated way (fibrillation). When an ICD detects tachycardia, it sends an electrical signal to the heart that restores the heartbeat to normal (cardioversion). This signal is usually painless. If cardioversion does not work or if the ICD detects fibrillation, it delivers a small electrical shock to the heart (defibrillation) to restart the heart. The shock may feel like a strong jolt in the chest.  ICDs may be programmed to correct other problems. Sometimes, ICDs are programmed to act as another type of implantable device called a pacemaker. Pacemakers are used to treat a slow heartbeat (bradycardia). LET YOUR CAREGIVER KNOW ABOUT:  Any allergies you have.  All medicines you are taking, including vitamins, herbs, eyedrops, and over-the-counter medicines and creams.  Previous problems you or members of your family have had with the use of anesthetics.  Any blood disorders you have had.  Other  health problems you have. RISKS AND COMPLICATIONS Generally, the procedure to implant an ICD is safe. However, as with any surgical procedure, complications can occur. Possible complications associated with implanting an ICD include:  Swelling, bleeding, or bruising at the site where the ICD was implanted.  Infection at the site where the ICD was implanted.  A reaction to medicine used during the procedure.  Nerve, heart, or blood vessel damage.  Blood clots. BEFORE THE PROCEDURE  You may need to have blood tests, heart tests, or a chest X-ray done before the day of the procedure.  Ask your caregiver about changing or stopping your regular medicines.  Make plans to have someone drive you home. You may need to stay in the hospital overnight after the procedure.  Stop smoking at least 24 hours before the procedure.  Take a bath or shower the night before the procedure. You may need to scrub your chest or abdomen with a special type of soap.  Do not eat or drink before your procedure for as long as directed by your caregiver. Ask if it is okay to take any needed medicine with a small sip of water. PROCEDURE  The procedure to implant an ICD in your chest or abdomen is usually done at a hospital in a room that has a large X-ray machine called a fluoroscope. The machine will be above you during the procedure. It will help your caregiver see your heart during the procedure. Implanting an ICD  usually takes 1-3 hours. Before the procedure:   Small monitors will be put on your body. They will be used to check your heart, blood pressure, and oxygen level.  A needle will be put into a vein in your hand or arm. This is called an intravenous (IV) access tube. Fluids and medicine will flow directly into your body through the IV tube.  Your chest or abdomen will be cleaned with a germ-killing (antiseptic) solution. The area may be shaved.  You may be given medicine to help you relax (sedative).  You will be given a medicine called a local anesthetic. This medicine will make the surgical site numb while the ICD is implanted. You will be sleepy but awake during the procedure. After you are numb the procedure will begin. The caregiver will:  Make a small cut (incision). This will make a pocket deep under your skin that will hold the pulse generator.  Guide the leads through a large blood vessel into your heart and attach them to the heart muscles. Depending on the ICD, the leads may go into one ventricle or they may go to both ventricles and into an upper chamber of the heart (atrium).  Test the ICD.  Close the incision with stitches, glue, or staples. AFTER THE PROCEDURE  You may feel pain. Some pain is normal. It may last a few days.  You may stay in a recovery area until the local anesthetic has worn off. Your blood pressure and pulse will be checked often. You will be taken to a room where your heart will be monitored.  A chest X-ray will be taken. This is done to check that the cardioverter defibrillator is in the right place.  You may stay in the hospital overnight.  A slight bump may be seen over the skin where the ICD was placed. Sometimes, it is possible to feel the ICD under the skin. This is normal.  In the months and years afterward, your caregiver will check the device, the  leads, and the battery every few months. Eventually, when the battery is low, the ICD will be  replaced.   This information is not intended to replace advice given to you by your health care provider. Make sure you discuss any questions you have with your health care provider.   Document Released: 12/13/2001 Document Revised: 01/11/2013 Document Reviewed: 04/11/2012 Elsevier Interactive Patient Education 2016 [place] Defibrillator Implantation, Care After This sheet gives you information about how to care for yourself after your procedure. Your health care provider may also give you more specific instructions. If you have problems or questions, contact your health care provider. What can I expect after the procedure? After the procedure, it is common to have:  Some pain. It may last a few days.  A slight bump over the skin where the device was placed. Sometimes, it is possible to feel the device under the skin. This is normal.  During the months and years after your procedure, your health care provider will check the device, the leads, and the battery every few months. Eventually, when the battery is low, the device will be replaced. Follow these instructions at home: Medicines  Take over-the-counter and prescription medicines only as told by your health care provider.  If you were prescribed an antibiotic medicine, take it as told by your health care provider. Do not stop taking the antibiotic even if you start to feel better. Incision care   Follow instructions from your health care provider about how to take care of your incision area. Make sure you: ? Wash your hands with soap and water before you change your bandage (dressing). If soap and water are not available, use hand sanitizer. ? Change your dressing as told by your health care provider. ? Leave stitches (sutures), skin glue, or adhesive strips in place. These skin closures may need to stay in place for 2 weeks or longer. If adhesive strip edges start to loosen and curl up, you may trim the  loose edges. Do not remove adhesive strips completely unless your health care provider tells you to do that.  Check your incision area every day for signs of infection. Check for: ? More redness, swelling, or pain. ? More fluid or blood. ? Warmth. ? Pus or a bad smell.  Do not use lotions or ointments near the incision area unless told by your health care provider.  Keep the incision area clean and dry for 2-3 days after the procedure or for as long as told by your health care provider. It takes several weeks for the incision site to heal completely.  Do not take baths, swim, or use a hot tub until your health care provider approves. Activity  Try to walk a little every day. Exercising is important after this procedure. Also, use your shoulder on the side of the defibrillator in daily tasks that do not require a lot of motion.  For at least 6 weeks: ? Do not lift your upper arm above your shoulders. This means no tennis, golf, or swimming for this period of time. If you tend to sleep with your arm above your head, use a restraint to prevent this during sleep. ? Avoid sudden jerking, pulling, or chopping movements that pull your upper arm far away from your body.  Ask your health care provider when you may go back to work.  Check with your health care provider before you start to drive or play sports. Pharmacist, community  Tell all health care providers that you have a defibrillator. This may prevent them from giving you an MRI scan because strong magnets are used for that test.  If you must pass through a metal detector, quickly walk through it. Do not stop under the detector, and do not stand near it.  Avoid places or objects that have a strong electric or magnetic field, including: ? Airport Herbalist. At the airport, let officials know that you have a defibrillator. Your defibrillator ID card will let you be checked in a way that is safe for you and will not damage your  defibrillator. Also, do not let a security person wave a magnetic wand near your defibrillator. That can make it stop working. ? Power plants. ? Large electrical generators. ? Anti-theft systems or electronic article surveillance (EAS). ? Radiofrequency transmission towers, such as cell phone and radio towers.  Do not use amateur (ham) radio equipment or electric (arc) welding torches. Some devices are safe to use if held at least 12 inches (30 cm) from your defibrillator. These include power tools, lawn mowers, and speakers. If you are unsure if something is safe to use, ask your health care provider.  Do not use MP3 player headphones. They have magnets.  You may safely use electric blankets, heating pads, computers, and microwave ovens.  When using your cell phone, hold it to the ear that is on the opposite side from the defibrillator. Do not leave your cell phone in a pocket over the defibrillator. General instructions  Follow diet instructions from your health care provider, if this applies.  Always keep your defibrillator ID card with you. The card should list the implant date, device model, and manufacturer. Consider wearing a medical alert bracelet or necklace.  Have your defibrillator checked every 3-6 months or as often as told by your health care provider. Most defibrillators last for 4-8 years.  Keep all follow-up visits as told by your health care provider. This is important for your health care provider to make sure your chest is healing the way it should. Ask your health care provider when you should come back to have your stitches or staples taken out. Contact a health care provider if:  You feel one shock in your chest.  You gain weight suddenly.  Your legs or feet swell more than they have before.  It feels like your heart is fluttering or skipping beats (heart palpitations).  You have more redness, swelling, or pain around your incision.  You have more fluid or  blood coming from your incision.  Your incision feels warm to the touch.  You have pus or a bad smell coming from your incision.  You have a fever. Get help right away if:  You have chest pain.  You feel more than one shock.  You feel more short of breath than you have felt before.  You feel more light-headed than you have felt before.  Your incision starts to open up. This information is not intended to replace advice given to you by your health care provider. Make sure you discuss any questions you have with your health care provider. Document Released: 10/10/2004 Document Revised: 10/11/2015 Document Reviewed: 08/28/2015 Elsevier Interactive Patient Education  2018 [place] Discharge Instructions for  Pacemaker/Defibrillator Patients  ACTIVITY No heavy lifting or vigorous activity with your left/right arm for 6 to 8 weeks.  Do not raise your left/right arm above your head for one week.  Gradually raise your affected arm as drawn below.           __  NO DRIVING for     ; you may begin driving on     .  WOUND CARE - Keep the wound area clean and dry.  Do not get this area wet for one week. No showers for one week; you may shower on     . - The tape/steri-strips on your wound will fall off; do not pull them off.  No bandage is needed on the site.  DO  NOT apply any creams, oils, or ointments to the wound area. - If you notice any drainage or discharge from the wound, any swelling or bruising at the site, or you develop a fever > 101? F after you are discharged home, call the office at once.  SPECIAL INSTRUCTIONS - You are still able to use cellular telephones; use the ear opposite the side where you have your pacemaker/defibrillator.  Avoid carrying your cellular phone near your device. - When traveling through airports, show security personnel your identification card to avoid being screened in the metal detectors.  Ask the security personnel to use  the hand wand. - Avoid arc welding equipment, MRI testing (magnetic resonance imaging), TENS units (transcutaneous nerve stimulators).  Call the office for questions about other devices. - Avoid electrical appliances that are in poor condition or are not properly grounded. - Microwave ovens are safe to be near or to operate.  ADDITIONAL INFORMATION FOR DEFIBRILLATOR PATIENTS SHOULD YOUR DEVICE GO OFF: - If your device goes off ONCE and you feel fine afterward, notify the device clinic nurses. - If your device goes off ONCE and you do not feel well afterward, call 911. - If your device goes off TWICE, call 911. - If your device goes off [place], call 911.  DO NOT DRIVE YOURSELF OR A FAMILY MEMBER WITH A DEFIBRILLATOR TO THE HOSPITAL-CALL 911.    Cardiac Ablation Cardiac ablation is a procedure to disable (ablate) a small amount of heart tissue in very specific places. The heart has many electrical connections. Sometimes these connections are abnormal and can cause the heart to beat very fast or irregularly. Ablating some of the problem areas can improve the heart rhythm or return it to normal. Ablation may be done for people who:  Have Wolff-Parkinson-White syndrome.  Have fast heart rhythms (tachycardia).  Have taken medicines for an abnormal heart rhythm (arrhythmia) that were not effective or caused side effects.  Have a high-risk heartbeat that may be life-threatening.  During the procedure, a small incision is made in the neck or the groin, and a long, thin, flexible tube (catheter) is inserted into the incision and moved to the heart. Small devices (electrodes) on the tip of the catheter will send out electrical currents. A type of X-ray (fluoroscopy) will be used to help guide the catheter and to provide images of the heart. Tell a health care provider about:  Any allergies you have.  All medicines you are taking, including vitamins, herbs, eye drops, creams, and  over-the-counter medicines.  Any problems you or family members have had with anesthetic medicines.  Any blood disorders you have.  Any surgeries you have had.  Any medical conditions you have, such as kidney failure.  Whether you are pregnant or may be pregnant. What are the risks? Generally, this is a safe procedure. However, problems may occur, including:  Infection.  Bruising and bleeding  at the catheter insertion site.  Bleeding into the chest, especially into the sac that surrounds the heart. This is a serious complication.  Stroke or blood clots.  Damage to other structures or organs.  Allergic reaction to medicines or dyes.  Need for a permanent pacemaker if the normal electrical system is damaged. A pacemaker is a small computer that sends electrical signals to the heart and helps your heart beat normally.  The procedure not being fully effective. This may not be recognized until months later. Repeat ablation procedures are sometimes required.  What happens before the procedure?  Follow instructions from your health care provider about eating or drinking restrictions.  Ask your health care provider about: ? Changing or stopping your regular medicines. This is especially important if you are taking diabetes medicines or blood thinners. ? Taking medicines such as aspirin and ibuprofen. These medicines can thin your blood. Do not take these medicines before your procedure if your health care provider instructs you not to.  Plan to have someone take you home from the hospital or clinic.  If you will be going home right after the procedure, plan to have someone with you for 24 hours. What happens during the procedure?  To lower your risk of infection: ? Your health care team will wash or sanitize their hands. ? Your skin will be washed with soap. ? Hair may be removed from the incision area.  An IV tube will be inserted into one of your veins.  You will be given  a medicine to help you relax (sedative).  The skin on your neck or groin will be numbed.  An incision will be made in your neck or your groin.  A needle will be inserted through the incision and into a large vein in your neck or groin.  A catheter will be inserted into the needle and moved to your heart.  Dye may be injected through the catheter to help your surgeon see the area of the heart that needs treatment.  Electrical currents will be sent from the catheter to ablate heart tissue in desired areas. There are three types of energy that may be used to ablate heart tissue: ? Heat (radiofrequency energy). ? Laser energy. ? Extreme cold (cryoablation).  When the necessary tissue has been ablated, the catheter will be removed.  Pressure will be held on the catheter insertion area to prevent excessive bleeding.  A bandage (dressing) will be placed over the catheter insertion area. The procedure may vary among health care providers and hospitals. What happens after the procedure?  Your blood pressure, heart rate, breathing rate, and blood oxygen level will be monitored until the medicines you were given have worn off.  Your catheter insertion area will be monitored for bleeding. You will need to lie still for a few hours to ensure that you do not bleed from the catheter insertion area.  Do not drive for 24 hours or as long as directed by your health care provider. Summary  Cardiac ablation is a procedure to disable (ablate) a small amount of heart tissue in very specific places. Ablating some of the problem areas can improve the heart rhythm or return it to normal.  During the procedure, electrical currents will be sent from the catheter to ablate heart tissue in desired areas. This information is not intended to replace advice given to you by your health care provider. Make sure you discuss any questions you have with your health care provider. Document  Released: 08/09/2008  Document Revised: 02/10/2016 Document Reviewed: 02/10/2016 Elsevier Interactive Patient Education  [employer].

## 2018-01-11 ENCOUNTER — Encounter: Payer: Self-pay | Admitting: Cardiology

## 2018-01-11 ENCOUNTER — Ambulatory Visit (INDEPENDENT_AMBULATORY_CARE_PROVIDER_SITE_OTHER): Payer: Medicare Other | Admitting: Cardiology

## 2018-01-11 VITALS — BP 110/60 | HR 54 | Ht 71.0 in | Wt 175.0 lb

## 2018-01-11 DIAGNOSIS — Z01812 Encounter for preprocedural laboratory examination: Secondary | ICD-10-CM

## 2018-01-11 DIAGNOSIS — I493 Ventricular premature depolarization: Secondary | ICD-10-CM

## 2018-01-11 DIAGNOSIS — I5022 Chronic systolic (congestive) heart failure: Secondary | ICD-10-CM | POA: Diagnosis not present

## 2018-01-11 DIAGNOSIS — I483 Typical atrial flutter: Secondary | ICD-10-CM

## 2018-01-11 NOTE — Progress Notes (Signed)
Electrophysiology Office Note   Date:  01/11/2018   ID:  PRESS CASALE, DOB 10-Feb-1941, MRN [id number]  PCP:  [name], MD  Cardiologist:  Marlou Porch Primary Electrophysiologist:  Constance Haw, MD    No chief complaint on file.    History of Present Illness: Carlos Gibson is a 77 y.o. male who presents today for electrophysiology evaluation.   Hx NICM/chronic systolic CHF (LHC 07/9447 with normal cors &EF 25-30%; last echo 05/2015 with EF 35-40%), intolerance to prior Toprol/carvedilol due to "feeling bad," secondary pulmonary hypertension, frequent PVCs, aortic root dilatation, Trifasicular block (RBBB + LAFB + 1st degree AVB), diabetes, Gilbert's syndrome, mild AI/MR by echo 05/2015, hyperlipidemia, thrombocytopenia, probable CKD stage III.  He was put on Entresto.  His ejection fraction has since improved to 40-45%.  Today, denies symptoms of palpitations, chest pain, shortness of breath, orthopnea, PND, lower extremity edema, claudication, dizziness, presyncope, syncope, bleeding, or neurologic sequela. The patient is tolerating medications without difficulties.  Overall he is feeling well.  He has no major complaints today.  He did have a chest cold that caused him some chest burning but once the chest cold was gone, no further burning occurred.  He otherwise feels well and is able to work without issue.   Past Medical History:  Diagnosis Date  . Aortic regurgitation    a. mild AI by echo 01/2016.  [name] Aortic root dilation ([facility])   . Aortic valve regurgitation   . Bifascicular block   . Chronic systolic CHF (congestive heart failure) ([address])   . CKD (chronic kidney disease), stage III ([place]) 01/01/2016  . Diabetes ([place])   . Ejection fraction < 50%   . Frequent PVCs   . Gilbert's syndrome   . Hyperlipidemia   . Mild diastolic dysfunction   . Mitral regurgitation    a. mod by echo 10/207.  [name] NICM (nonischemic cardiomyopathy) ([place])   . Pericardial effusion    a. small by  echo 01/2016.  . Pulmonary hypertension ([place])   . Right BBB/left post fasc block   . Thrombocytopenia ([place])    Past Surgical History:  Procedure Laterality Date  . CARDIAC CATHETERIZATION N/A 12/20/2014   Procedure: Right/Left Heart Cath and Coronary Angiography;  Surgeon: Jerline Pain, MD;  Location: [place] [facility];  Service: Cardiovascular;  Laterality: N/A;     Current Outpatient Medications  Medication Sig Dispense Refill  . alfuzosin (UROXATRAL) 10 MG 24 hr tablet Take 10 mg by mouth daily.  3  . apixaban (ELIQUIS) 5 MG TABS tablet Take 1 tablet (5 mg total) by mouth 2 (two) times daily. 60 tablet 6  . bisoprolol (ZEBETA) 5 MG tablet TAKE 1 TABLET BY MOUTH EVERY DAY 90 tablet 3  . cholecalciferol (VITAMIN D) 1000 units tablet Take 1,000 Units by mouth every other day.    . finasteride (PROSCAR) 5 MG tablet Take 5 mg by mouth daily.   2  . furosemide (LASIX) 40 MG tablet Take 2 tablets in the morning 45 tablet 11  . glimepiride (AMARYL) 4 MG tablet Take one tablet in the mornings and one-half tablet at night by mouth    . losartan (COZAAR) 25 MG tablet Take 25 mg by mouth daily.  3  . potassium chloride (K-DUR,KLOR-CON) 10 MEQ tablet Take 2 tablets (20 mEq total) by mouth daily. 60 tablet 11  . spironolactone (ALDACTONE) 25 MG tablet Take 0.5 tablets (12.5 mg total) by mouth daily. 30 tablet 11  No current facility-administered medications for this visit.     Allergies:   Ace inhibitors; Coreg [carvedilol]; and Toprol xl [metoprolol tartrate]   Social History:  The patient  reports that he has never smoked. He has never used smokeless tobacco. He reports that he does not drink alcohol or use drugs.   Family History:  The patient's family history includes Hypertension in his mother; Irregular heart beat in his father; Stroke in his mother; Unexplained death in his father.    ROS:  Please see the history of present illness.   Otherwise, review of systems is positive for  none.   All other systems are reviewed and negative.   PHYSICAL EXAM: VS:  BP 110/60   Pulse (!) 54   Ht 5\' 11"  (1.803 m)   Wt 175 lb (79.4 kg)   SpO2 98%   BMI 24.41 kg/m  , BMI Body mass index is 24.41 kg/m. GEN: Well nourished, well developed, in no acute distress  HEENT: normal  Neck: no JVD, carotid bruits, or masses Cardiac: iRRR; no murmurs, rubs, or gallops,no edema  Respiratory:  clear to auscultation bilaterally, normal work of breathing GI: soft, nontender, nondistended, + BS MS: no deformity or atrophy  Skin: warm and dry Neuro:  Strength and sensation are intact Psych: euthymic mood, full affect  EKG:  EKG is not ordered today. Personal review of the ekg ordered 12/24/17 shows atrial flutter, rate 72, right bundle branch block, PVCs  Recent Labs: 11/09/2017: BUN 21; Creatinine, Ser 1.67; NT-Pro BNP 3,222; Potassium 4.6; Sodium 143    Lipid Panel  No results found for: CHOL, TRIG, HDL, CHOLHDL, VLDL, LDLCALC, LDLDIRECT   Wt Readings from Last 3 Encounters:  01/11/18 175 lb (79.4 kg)  12/24/17 181 lb (82.1 kg)  12/07/17 175 lb 12.8 oz (79.7 kg)      Other studies Reviewed: Additional studies/ records that were reviewed today include: CMRI  Review of the above records today demonstrates:  1. Severely dilated left ventricle with normal wall thickness and severely reduced systolic function (LVEF = 34%). There is diffuse hypokinesis.  There is no late gadolinium enhancement in the left ventricular myocardium.  2. Mildly dilated right ventricular size with normal wall thickness and moderately decreased systolic function (LVEF = 32%). There are no regional wall motion abnormalities.  3.  Severe bi-atrial dilatation.  No left atrial thrombus.  4. Normal size of the aortic root, ascending aorta. Dilated pulmonary artery measuring 34 mm.  5. Moderate mitral regurgitation with posteriorly directed jet. Mild to moderate tricuspid regurgitation.  6.   Normal pericardium.  Trivial pericardial effusion.  ASSESSMENT AND PLAN:  1.  Nonischemic cardiomyopathy: Currently on losartan and bisoprolol.  His ejection fraction remains low.  Plan for ICD implant.  Risks and benefits were discussed include bleeding, tamponade, infection, pneumothorax.  He understands these risks and is agreed to the procedure.  2. PVCs: 1% on his monitor.  Currently asymptomatic.  3. Hypertension: Well-controlled today.  4.  Atrial flutter: Appears typical in nature.  He is currently on Eliquis.  We Carlos Gibson plan for ablation.  Risks and benefits were discussed include bleeding, tamponade, heart block, stroke.  The patient understands the risks and is agreed to the procedure.  This patients CHA2DS2-VASc Score and unadjusted Ischemic Stroke Rate (% per year) is equal to 4.8 % stroke rate/year from a score of 4  Above score calculated as 1 point each if present [CHF, HTN, DM, Vascular=MI/PAD/Aortic Plaque, Age if 72-74, or  Male] Above score calculated as 2 points each if present [Age > 75, or Stroke/TIA/TE]    Current medicines are reviewed at length with the patient today.   The patient does not have concerns regarding his medicines.  The following changes were made today:  Start eliquis  Labs/ tests ordered today include:  Orders Placed This Encounter  Procedures  . Basic metabolic panel  . CBC    Disposition:   FU with Carlos Gibson 1 months  Signed, Carlos Conely Meredith Leeds, MD  01/11/2018 12:51 PM     [place] [address] [place]  [address] [phone] (office) [phone] (fax)

## 2018-01-11 NOTE — Addendum Note (Signed)
Addended by: Marciano Sequin on: 01/11/2018 01:32 PM   Modules accepted: Orders

## 2018-01-11 NOTE — Patient Instructions (Signed)
Medication Instructions:  Your physician recommends that you continue on your current medications as directed. Please refer to the Current Medication list given to you today.  If you need a refill on your cardiac medications before your next appointment, please call your pharmacy.   Lab work: Pre procedure labs today: BMET  & CBC If you have labs (blood work) drawn today and your tests are completely normal, you will receive your results only by: [name] MyChart Message (if you have MyChart) OR . A paper copy in the mail If you have any lab test that is abnormal or we need to change your treatment, we will call you to review the results.  Testing/Procedures: None ordered  Follow-Up: At [facility], you and your health needs are our priority.  As part of our continuing mission to provide you with exceptional heart care, we have created designated Provider Care Teams.  These Care Teams include your primary Cardiologist (physician) and Advanced Practice Providers (APPs -  Physician Assistants and Nurse Practitioners) who all work together to provide you with the care you need, when you need it. . You will need a follow up appointment in 4 weeks with Dr. Curt Bears . You will need a follow up appointment in 91 days, after your procedure on 01/26/18, with Dr. Curt Bears.  Your physician recommends that you schedule a follow-up appointment in: 10-14 days, after your procedure on 01/26/18, with device clinic for a wound check.  Thank you for choosing [facility]!!   Trinidad Curet, RN [phone]  Any Other Special Instructions Will Be Listed Below (If Applicable).  * Please note that any paperwork needing to be filled out by the provider will need to be addressed at the front desk prior to seeing the provider.  Please note that any FMLA, disability or other documents regarding health condition is subject to a $25.00 charge that must be received prior to completion of paperwork in the form of a  money order or check. *    Any Other Special Instructions Will Be Listed Below (If Applicable).    Ablation & Implantable Device Instructions  You are scheduled for:                  _____ Ablation + Implantable Cardioverter Defibrillator  on  01/26/2018  with Dr. Curt Bears.  1.   Please arrive at the [facility], Entrance "A"  at [facility] at  6:30 a.m. on the day of your procedure. (The address is [address])  2. Do not eat or drink after midnight the night before your procedure.  3.   Complete pre procedure  lab work on 01/11/18.  The lab at [facility] is open from 8:00 AM to 4:30 PM.  You do not have to be fasting.  4.   A) Make sure to continue taking your Eliquis.  You will not stop this medication for  these procedures.        B)  Hold all of your morning medications the morning of your procedure.  5.  Plan for an overnight stay.  Bring your insurance cards and a list of you medications.  6.  Wash your chest and neck with surgical scrub the evening before and the morning of  your procedure.  Rinse well. Please review the surgical scrub instruction sheet given to you.  7. Your chest will need to be shaved prior to this procedure (if needed). We ask that you do this  yourself at home 1 to 2 days before or if uncomfortable/unable to do yourself, then it will be performed by the hospital staff the day of.                                                                                                                * If you have ANY questions after you get home, please call Trinidad Curet, RN @ [phone].  * Every attempt is made to prevent procedures from being rescheduled.  Due to the nature of  Electrophysiology, rescheduling can happen.  The physician is always aware and directs the staff when this occurs.        Cardioverter Defibrillator Implantation An implantable cardioverter defibrillator (ICD) is a small, lightweight,  battery-powered device that is placed (implanted) under the skin in the chest or abdomen. Your caregiver may prescribe an ICD if:  You have had an irregular heart rhythm (arrhythmia) that originated in the lower chambers of the heart (ventricles).  Your heart has been damaged by a disease (such as coronary artery disease) or heart condition (such as a heart attack). An ICD consists of a battery that lasts several years, a small computer called a pulse generator, and wires called leads that go into the heart. It is used to detect and correct two dangerous arrhythmias: a rapid heart rhythm (tachycardia) and an arrhythmia in which the ventricles contract in an uncoordinated way (fibrillation). When an ICD detects tachycardia, it sends an electrical signal to the heart that restores the heartbeat to normal (cardioversion). This signal is usually painless. If cardioversion does not work or if the ICD detects fibrillation, it delivers a small electrical shock to the heart (defibrillation) to restart the heart. The shock may feel like a strong jolt in the chest. ICDs may be programmed to correct other problems. Sometimes, ICDs are programmed to act as another type of implantable device called a pacemaker. Pacemakers are used to treat a slow heartbeat (bradycardia). LET YOUR CAREGIVER KNOW ABOUT:  Any allergies you have.  All medicines you are taking, including vitamins, herbs, eyedrops, and over-the-counter medicines and creams.  Previous problems you or members of your family have had with the use of anesthetics.  Any blood disorders you have had.  Other health problems you have. RISKS AND COMPLICATIONS Generally, the procedure to implant an ICD is safe. However, as with any surgical procedure, complications can occur. Possible complications associated with implanting an ICD include:  Swelling, bleeding, or bruising at the site where the ICD was implanted.  Infection at the site where the ICD was  implanted.  A reaction to medicine used during the procedure.  Nerve, heart, or blood vessel damage.  Blood clots. BEFORE THE PROCEDURE  You may need to have blood tests, heart tests, or a chest X-ray done before the day of the procedure.  Ask your caregiver about changing or stopping your regular medicines.  Make plans to have someone drive you home. You may need to stay in the hospital overnight after  the procedure.  Stop smoking at least 24 hours before the procedure.  Take a bath or shower the night before the procedure. You may need to scrub your chest or abdomen with a special type of soap.  Do not eat or drink before your procedure for as long as directed by your caregiver. Ask if it is okay to take any needed medicine with a small sip of water. PROCEDURE  The procedure to implant an ICD in your chest or abdomen is usually done at a hospital in a room that has a large X-ray machine called a fluoroscope. The machine will be above you during the procedure. It will help your caregiver see your heart during the procedure. Implanting an ICD usually takes 1-3 hours. Before the procedure:   Small monitors will be put on your body. They will be used to check your heart, blood pressure, and oxygen level.  A needle will be put into a vein in your hand or arm. This is called an intravenous (IV) access tube. Fluids and medicine will flow directly into your body through the IV tube.  Your chest or abdomen will be cleaned with a germ-killing (antiseptic) solution. The area may be shaved.  You may be given medicine to help you relax (sedative).  You will be given a medicine called a local anesthetic. This medicine will make the surgical site numb while the ICD is implanted. You will be sleepy but awake during the procedure. After you are numb the procedure will begin. The caregiver will:  Make a small cut (incision). This will make a pocket deep under your skin that will hold the pulse  generator.  Guide the leads through a large blood vessel into your heart and attach them to the heart muscles. Depending on the ICD, the leads may go into one ventricle or they may go to both ventricles and into an upper chamber of the heart (atrium).  Test the ICD.  Close the incision with stitches, glue, or staples. AFTER THE PROCEDURE  You may feel pain. Some pain is normal. It may last a few days.  You may stay in a recovery area until the local anesthetic has worn off. Your blood pressure and pulse will be checked often. You will be taken to a room where your heart will be monitored.  A chest X-ray will be taken. This is done to check that the cardioverter defibrillator is in the right place.  You may stay in the hospital overnight.  A slight bump may be seen over the skin where the ICD was placed. Sometimes, it is possible to feel the ICD under the skin. This is normal.  In the months and years afterward, your caregiver will check the device, the leads, and the battery every few months. Eventually, when the battery is low, the ICD will be replaced.   This information is not intended to replace advice given to you by your health care provider. Make sure you discuss any questions you have with your health care provider.   Document Released: 12/13/2001 Document Revised: 01/11/2013 Document Reviewed: 04/11/2012 Elsevier Interactive Patient Education 2016 [place] Defibrillator Implantation, Care After This sheet gives you information about how to care for yourself after your procedure. Your health care provider may also give you more specific instructions. If you have problems or questions, contact your health care provider. What can I expect after the procedure? After the procedure, it is common to have:  Some pain.  It may last a few days.  A slight bump over the skin where the device was placed. Sometimes, it is possible to feel the device under the skin.  This is normal.  During the months and years after your procedure, your health care provider will check the device, the leads, and the battery every few months. Eventually, when the battery is low, the device will be replaced. Follow these instructions at home: Medicines  Take over-the-counter and prescription medicines only as told by your health care provider.  If you were prescribed an antibiotic medicine, take it as told by your health care provider. Do not stop taking the antibiotic even if you start to feel better. Incision care   Follow instructions from your health care provider about how to take care of your incision area. Make sure you: ? Wash your hands with soap and water before you change your bandage (dressing). If soap and water are not available, use hand sanitizer. ? Change your dressing as told by your health care provider. ? Leave stitches (sutures), skin glue, or adhesive strips in place. These skin closures may need to stay in place for 2 weeks or longer. If adhesive strip edges start to loosen and curl up, you may trim the loose edges. Do not remove adhesive strips completely unless your health care provider tells you to do that.  Check your incision area every day for signs of infection. Check for: ? More redness, swelling, or pain. ? More fluid or blood. ? Warmth. ? Pus or a bad smell.  Do not use lotions or ointments near the incision area unless told by your health care provider.  Keep the incision area clean and dry for 2-3 days after the procedure or for as long as told by your health care provider. It takes several weeks for the incision site to heal completely.  Do not take baths, swim, or use a hot tub until your health care provider approves. Activity  Try to walk a little every day. Exercising is important after this procedure. Also, use your shoulder on the side of the defibrillator in daily tasks that do not require a lot of motion.  For at least 6  weeks: ? Do not lift your upper arm above your shoulders. This means no tennis, golf, or swimming for this period of time. If you tend to sleep with your arm above your head, use a restraint to prevent this during sleep. ? Avoid sudden jerking, pulling, or chopping movements that pull your upper arm far away from your body.  Ask your health care provider when you may go back to work.  Check with your health care provider before you start to drive or play sports. Electric and magnetic fields  Tell all health care providers that you have a defibrillator. This may prevent them from giving you an MRI scan because strong magnets are used for that test.  If you must pass through a metal detector, quickly walk through it. Do not stop under the detector, and do not stand near it.  Avoid places or objects that have a strong electric or magnetic field, including: ? Airport [employer]. At the airport, let officials know that you have a defibrillator. Your defibrillator ID card will let you be checked in a way that is safe for you and will not damage your defibrillator. Also, do not let a security person wave a magnetic wand near your defibrillator. That can make it stop working. ? Power plants. ?  Large electrical generators. ? Anti-theft systems or electronic article surveillance (EAS). ? Radiofrequency transmission towers, such as cell phone and radio towers.  Do not use amateur (ham) radio equipment or electric (arc) welding torches. Some devices are safe to use if held at least 12 inches (30 cm) from your defibrillator. These include power tools, lawn mowers, and speakers. If you are unsure if something is safe to use, ask your health care provider.  Do not use MP3 player headphones. They have magnets.  You may safely use electric blankets, heating pads, computers, and microwave ovens.  When using your cell phone, hold it to the ear that is on the opposite side from the defibrillator. Do not  leave your cell phone in a pocket over the defibrillator. General instructions  Follow diet instructions from your health care provider, if this applies.  Always keep your defibrillator ID card with you. The card should list the implant date, device model, and manufacturer. Consider wearing a medical alert bracelet or necklace.  Have your defibrillator checked every 3-6 months or as often as told by your health care provider. Most defibrillators last for 4-8 years.  Keep all follow-up visits as told by your health care provider. This is important for your health care provider to make sure your chest is healing the way it should. Ask your health care provider when you should come back to have your stitches or staples taken out. Contact a health care provider if:  You feel one shock in your chest.  You gain weight suddenly.  Your legs or feet swell more than they have before.  It feels like your heart is fluttering or skipping beats (heart palpitations).  You have more redness, swelling, or pain around your incision.  You have more fluid or blood coming from your incision.  Your incision feels warm to the touch.  You have pus or a bad smell coming from your incision.  You have a fever. Get help right away if:  You have chest pain.  You feel more than one shock.  You feel more short of breath than you have felt before.  You feel more light-headed than you have felt before.  Your incision starts to open up. This information is not intended to replace advice given to you by your health care provider. Make sure you discuss any questions you have with your health care provider. Document Released: 10/10/2004 Document Revised: 10/11/2015 Document Reviewed: 08/28/2015 Elsevier Interactive Patient Education  2018 [employer] Discharge Instructions for  Pacemaker/Defibrillator Patients  ACTIVITY No heavy lifting or vigorous activity with your left/right arm  for 6 to 8 weeks.  Do not raise your left/right arm above your head for one week.  Gradually raise your affected arm as drawn below.           __  NO DRIVING for     ; you may begin driving on     .  WOUND CARE - Keep the wound area clean and dry.  Do not get this area wet for one week. No showers for one week; you may shower on     . - The tape/steri-strips on your wound will fall off; do not pull them off.  No bandage is needed on the site.  DO  NOT apply any creams, oils, or ointments to the wound area. - If you notice any drainage or discharge from the wound, any swelling or bruising at the site, or you  develop a fever > 101? F after you are discharged home, call the office at once.  SPECIAL INSTRUCTIONS - You are still able to use cellular telephones; use the ear opposite the side where you have your pacemaker/defibrillator.  Avoid carrying your cellular phone near your device. - When traveling through airports, show security personnel your identification card to avoid being screened in the metal detectors.  Ask the security personnel to use the hand wand. - Avoid arc welding equipment, MRI testing (magnetic resonance imaging), TENS units (transcutaneous nerve stimulators).  Call the office for questions about other devices. - Avoid electrical appliances that are in poor condition or are not properly grounded. - Microwave ovens are safe to be near or to operate.  ADDITIONAL INFORMATION FOR DEFIBRILLATOR PATIENTS SHOULD YOUR DEVICE GO OFF: - If your device goes off ONCE and you feel fine afterward, notify the device clinic nurses. - If your device goes off ONCE and you do not feel well afterward, call 911. - If your device goes off TWICE, call 911. - If your device goes off [place], call 911.  DO NOT DRIVE YOURSELF OR A FAMILY MEMBER WITH A DEFIBRILLATOR TO THE HOSPITAL-CALL 911.

## 2018-01-12 LAB — CBC
HEMOGLOBIN: 12.8 g/dL — AB (ref 13.0–17.7)
Hematocrit: 37.9 % (ref 37.5–51.0)
MCH: 28.1 pg (ref 26.6–33.0)
MCHC: 33.8 g/dL (ref 31.5–35.7)
MCV: 83 fL (ref 79–97)
PLATELETS: 136 10*3/uL — AB (ref 150–450)
RBC: 4.56 x10E6/uL (ref 4.14–5.80)
RDW: 12.4 % (ref 12.3–15.4)
WBC: 5.3 10*3/uL (ref 3.4–10.8)

## 2018-01-12 LAB — BASIC METABOLIC PANEL
BUN / CREAT RATIO: 13 (ref 10–24)
BUN: 21 mg/dL (ref 8–27)
CO2: 25 mmol/L (ref 20–29)
CREATININE: 1.63 mg/dL — AB (ref 0.76–1.27)
Calcium: 9.7 mg/dL (ref 8.6–10.2)
Chloride: 103 mmol/L (ref 96–106)
GFR calc non Af Amer: 40 mL/min/{1.73_m2} — ABNORMAL LOW (ref >59)
GFR, EST AFRICAN AMERICAN: 46 mL/min/{1.73_m2} — AB (ref >59)
GLUCOSE: 112 mg/dL — AB (ref 65–99)
Potassium: 4.5 mmol/L (ref 3.5–5.2)
SODIUM: 144 mmol/L (ref 134–144)

## 2018-01-19 ENCOUNTER — Telehealth: Payer: Self-pay | Admitting: Cardiology

## 2018-01-19 NOTE — Telephone Encounter (Signed)
New Message   Pt is calling, wanting to know if he needs an appt prior to his cath. Please call

## 2018-01-19 NOTE — Telephone Encounter (Signed)
Informed pt that he does not need to be seen prior to his scheduled procedures next week. Pt appreciates the call

## 2018-01-26 ENCOUNTER — Encounter (HOSPITAL_COMMUNITY)
Admission: RE | Disposition: A | Discharge status: Home / Self Care | Payer: Self-pay | Source: Ambulatory Visit | Attending: Cardiology

## 2018-01-26 ENCOUNTER — Ambulatory Visit (HOSPITAL_COMMUNITY): Admit: 2018-01-26 | Payer: Medicare Other | Admitting: Cardiology

## 2018-01-26 ENCOUNTER — Ambulatory Visit (HOSPITAL_COMMUNITY)
Admission: RE | Admit: 2018-01-26 | Discharge: 2018-01-27 | Disposition: A | Discharge status: Home / Self Care | Payer: Medicare Other | Source: Ambulatory Visit | Attending: Cardiology | Admitting: Cardiology

## 2018-01-26 ENCOUNTER — Encounter (HOSPITAL_COMMUNITY): Payer: Self-pay | Admitting: Cardiology

## 2018-01-26 ENCOUNTER — Other Ambulatory Visit: Payer: Self-pay

## 2018-01-26 DIAGNOSIS — Z7984 Long term (current) use of oral hypoglycemic drugs: Secondary | ICD-10-CM | POA: Insufficient documentation

## 2018-01-26 DIAGNOSIS — Z006 Encounter for examination for normal comparison and control in clinical research program: Secondary | ICD-10-CM | POA: Insufficient documentation

## 2018-01-26 DIAGNOSIS — Z823 Family history of stroke: Secondary | ICD-10-CM | POA: Insufficient documentation

## 2018-01-26 DIAGNOSIS — Z888 Allergy status to other drugs, medicaments and biological substances status: Secondary | ICD-10-CM | POA: Insufficient documentation

## 2018-01-26 DIAGNOSIS — I272 Pulmonary hypertension, unspecified: Secondary | ICD-10-CM | POA: Insufficient documentation

## 2018-01-26 DIAGNOSIS — E785 Hyperlipidemia, unspecified: Secondary | ICD-10-CM | POA: Insufficient documentation

## 2018-01-26 DIAGNOSIS — I080 Rheumatic disorders of both mitral and aortic valves: Secondary | ICD-10-CM | POA: Insufficient documentation

## 2018-01-26 DIAGNOSIS — N183 Chronic kidney disease, stage 3 (moderate): Secondary | ICD-10-CM | POA: Insufficient documentation

## 2018-01-26 DIAGNOSIS — Z7982 Long term (current) use of aspirin: Secondary | ICD-10-CM | POA: Diagnosis not present

## 2018-01-26 DIAGNOSIS — E1122 Type 2 diabetes mellitus with diabetic chronic kidney disease: Secondary | ICD-10-CM | POA: Diagnosis not present

## 2018-01-26 DIAGNOSIS — Z7901 Long term (current) use of anticoagulants: Secondary | ICD-10-CM | POA: Insufficient documentation

## 2018-01-26 DIAGNOSIS — D696 Thrombocytopenia, unspecified: Secondary | ICD-10-CM | POA: Diagnosis not present

## 2018-01-26 DIAGNOSIS — I493 Ventricular premature depolarization: Secondary | ICD-10-CM | POA: Insufficient documentation

## 2018-01-26 DIAGNOSIS — Z8249 Family history of ischemic heart disease and other diseases of the circulatory system: Secondary | ICD-10-CM | POA: Insufficient documentation

## 2018-01-26 DIAGNOSIS — Z79899 Other long term (current) drug therapy: Secondary | ICD-10-CM | POA: Insufficient documentation

## 2018-01-26 DIAGNOSIS — I5022 Chronic systolic (congestive) heart failure: Secondary | ICD-10-CM | POA: Insufficient documentation

## 2018-01-26 DIAGNOSIS — I452 Bifascicular block: Secondary | ICD-10-CM | POA: Diagnosis not present

## 2018-01-26 DIAGNOSIS — I428 Other cardiomyopathies: Secondary | ICD-10-CM

## 2018-01-26 DIAGNOSIS — I483 Typical atrial flutter: Secondary | ICD-10-CM | POA: Insufficient documentation

## 2018-01-26 DIAGNOSIS — Z23 Encounter for immunization: Secondary | ICD-10-CM | POA: Insufficient documentation

## 2018-01-26 DIAGNOSIS — Z955 Presence of coronary angioplasty implant and graft: Secondary | ICD-10-CM | POA: Insufficient documentation

## 2018-01-26 DIAGNOSIS — Z95818 Presence of other cardiac implants and grafts: Secondary | ICD-10-CM

## 2018-01-26 HISTORY — PX: BIV ICD INSERTION CRT-D: EP1195

## 2018-01-26 LAB — GLUCOSE, CAPILLARY: Glucose-Capillary: 133 mg/dL — ABNORMAL HIGH (ref 70–99)

## 2018-01-26 LAB — SURGICAL PCR SCREEN
MRSA, PCR: NEGATIVE
Staphylococcus aureus: NEGATIVE

## 2018-01-26 SURGERY — A-FLUTTER ABLATION
Anesthesia: LOCAL

## 2018-01-26 SURGERY — BIV ICD INSERTION CRT-D

## 2018-01-26 MED ORDER — FENTANYL CITRATE (PF) 100 MCG/2ML IJ SOLN
INTRAMUSCULAR | Status: DC | PRN
  Administered 2018-01-26 (×2): 25 ug via INTRAVENOUS

## 2018-01-26 MED ORDER — ATORVASTATIN CALCIUM 10 MG PO TABS
10.0000 mg | ORAL_TABLET | Freq: Every day | ORAL | Status: DC
  Administered 2018-01-26: 10 mg via ORAL
  Filled 2018-01-26: qty 1

## 2018-01-26 MED ORDER — SODIUM CHLORIDE 0.9 % IV SOLN
INTRAVENOUS | Status: DC
  Administered 2018-01-26: 07:00:00 via INTRAVENOUS

## 2018-01-26 MED ORDER — CEFAZOLIN SODIUM-DEXTROSE 2-4 GM/100ML-% IV SOLN
2.0000 g | INTRAVENOUS | Status: AC
  Administered 2018-01-26: 2 g via INTRAVENOUS
  Filled 2018-01-26: qty 100

## 2018-01-26 MED ORDER — FUROSEMIDE 40 MG PO TABS
40.0000 mg | ORAL_TABLET | Freq: Every day | ORAL | Status: DC
  Administered 2018-01-27: 40 mg via ORAL
  Filled 2018-01-26: qty 1

## 2018-01-26 MED ORDER — ACETAMINOPHEN 325 MG PO TABS
325.0000 mg | ORAL_TABLET | ORAL | Status: DC | PRN
  Administered 2018-01-26: 650 mg via ORAL
  Filled 2018-01-26: qty 2

## 2018-01-26 MED ORDER — LIDOCAINE HCL 1 % IJ SOLN
INTRAMUSCULAR | Status: AC
  Filled 2018-01-26: qty 60

## 2018-01-26 MED ORDER — BISOPROLOL FUMARATE 5 MG PO TABS
5.0000 mg | ORAL_TABLET | Freq: Every day | ORAL | Status: DC
  Administered 2018-01-26 – 2018-01-27 (×2): 5 mg via ORAL
  Filled 2018-01-26 (×2): qty 1

## 2018-01-26 MED ORDER — TRAMADOL HCL 50 MG PO TABS
50.0000 mg | ORAL_TABLET | Freq: Four times a day (QID) | ORAL | Status: DC | PRN
  Administered 2018-01-26 – 2018-01-27 (×2): 50 mg via ORAL
  Filled 2018-01-26 (×2): qty 1

## 2018-01-26 MED ORDER — MIDAZOLAM HCL 5 MG/5ML IJ SOLN
INTRAMUSCULAR | Status: AC
  Filled 2018-01-26: qty 5

## 2018-01-26 MED ORDER — CEFAZOLIN SODIUM-DEXTROSE 2-4 GM/100ML-% IV SOLN
INTRAVENOUS | Status: AC
  Filled 2018-01-26: qty 100

## 2018-01-26 MED ORDER — INFLUENZA VAC SPLIT HIGH-DOSE 0.5 ML IM SUSY
0.5000 mL | PREFILLED_SYRINGE | INTRAMUSCULAR | Status: AC
  Administered 2018-01-27: 0.5 mL via INTRAMUSCULAR
  Filled 2018-01-26: qty 0.5

## 2018-01-26 MED ORDER — MIDAZOLAM HCL 5 MG/5ML IJ SOLN
INTRAMUSCULAR | Status: DC | PRN
  Administered 2018-01-26 (×2): 1 mg via INTRAVENOUS

## 2018-01-26 MED ORDER — IOPAMIDOL (ISOVUE-370) INJECTION 76%
INTRAVENOUS | Status: DC | PRN
  Administered 2018-01-26: 35 mL via INTRAVENOUS

## 2018-01-26 MED ORDER — HEPARIN (PORCINE) IN NACL 1000-0.9 UT/500ML-% IV SOLN
INTRAVENOUS | Status: DC | PRN
  Administered 2018-01-26: 500 mL

## 2018-01-26 MED ORDER — FENTANYL CITRATE (PF) 100 MCG/2ML IJ SOLN
INTRAMUSCULAR | Status: AC
  Filled 2018-01-26: qty 2

## 2018-01-26 MED ORDER — POTASSIUM CHLORIDE CRYS ER 20 MEQ PO TBCR
20.0000 meq | EXTENDED_RELEASE_TABLET | Freq: Every day | ORAL | Status: DC
  Administered 2018-01-27: 20 meq via ORAL
  Filled 2018-01-26: qty 1

## 2018-01-26 MED ORDER — MUPIROCIN 2 % EX OINT
TOPICAL_OINTMENT | CUTANEOUS | Status: AC
  Administered 2018-01-26: 1
  Filled 2018-01-26: qty 22

## 2018-01-26 MED ORDER — APIXABAN 5 MG PO TABS: 5.0000 mg | ORAL_TABLET | Freq: Two times a day (BID) | ORAL | Status: DC

## 2018-01-26 MED ORDER — GLIMEPIRIDE 4 MG PO TABS
2.0000 mg | ORAL_TABLET | Freq: Every day | ORAL | Status: DC
  Administered 2018-01-27: 2 mg via ORAL
  Filled 2018-01-26: qty 1

## 2018-01-26 MED ORDER — SPIRONOLACTONE 12.5 MG HALF TABLET
12.5000 mg | ORAL_TABLET | Freq: Every day | ORAL | Status: DC
  Administered 2018-01-27: 12.5 mg via ORAL
  Filled 2018-01-26: qty 1

## 2018-01-26 MED ORDER — VITAMIN D 1000 UNITS PO TABS
5000.0000 [IU] | ORAL_TABLET | ORAL | Status: DC
  Administered 2018-01-27: 5000 [IU] via ORAL
  Filled 2018-01-26: qty 5

## 2018-01-26 MED ORDER — LOSARTAN POTASSIUM 25 MG PO TABS
25.0000 mg | ORAL_TABLET | Freq: Every day | ORAL | Status: DC
  Administered 2018-01-26 – 2018-01-27 (×2): 25 mg via ORAL
  Filled 2018-01-26 (×2): qty 1

## 2018-01-26 MED ORDER — CHLORHEXIDINE GLUCONATE 4 % EX LIQD
60.0000 mL | Freq: Once | CUTANEOUS | Status: DC
  Filled 2018-01-26: qty 60

## 2018-01-26 MED ORDER — ONDANSETRON HCL 4 MG/2ML IJ SOLN: 4.0000 mg | Freq: Four times a day (QID) | INTRAMUSCULAR | Status: DC | PRN

## 2018-01-26 MED ORDER — ALFUZOSIN HCL ER 10 MG PO TB24
10.0000 mg | ORAL_TABLET | Freq: Every day | ORAL | Status: DC
  Administered 2018-01-26: 10 mg via ORAL
  Filled 2018-01-26: qty 1

## 2018-01-26 MED ORDER — HEPARIN (PORCINE) IN NACL 1000-0.9 UT/500ML-% IV SOLN
INTRAVENOUS | Status: AC
  Filled 2018-01-26: qty 500

## 2018-01-26 MED ORDER — FINASTERIDE 5 MG PO TABS
5.0000 mg | ORAL_TABLET | Freq: Every day | ORAL | Status: DC
  Administered 2018-01-27: 5 mg via ORAL
  Filled 2018-01-26: qty 1

## 2018-01-26 MED ORDER — SODIUM CHLORIDE 0.9 % IV SOLN: INTRAVENOUS | Status: DC

## 2018-01-26 MED ORDER — SODIUM CHLORIDE 0.9 % IV SOLN
INTRAVENOUS | Status: AC
  Filled 2018-01-26: qty 2

## 2018-01-26 MED ORDER — CEFAZOLIN SODIUM-DEXTROSE 1-4 GM/50ML-% IV SOLN
1.0000 g | Freq: Four times a day (QID) | INTRAVENOUS | Status: AC
  Administered 2018-01-26 – 2018-01-27 (×3): 1 g via INTRAVENOUS
  Filled 2018-01-26 (×3): qty 50

## 2018-01-26 MED ORDER — SODIUM CHLORIDE 0.9 % IV SOLN
80.0000 mg | INTRAVENOUS | Status: AC
  Administered 2018-01-26: 80 mg
  Filled 2018-01-26: qty 2

## 2018-01-26 MED ORDER — IOPAMIDOL (ISOVUE-370) INJECTION 76%
INTRAVENOUS | Status: AC
  Filled 2018-01-26: qty 50

## 2018-01-26 MED ORDER — ASPIRIN EC 81 MG PO TBEC: 81.0000 mg | DELAYED_RELEASE_TABLET | Freq: Every day | ORAL | Status: DC

## 2018-01-26 MED ORDER — LIDOCAINE HCL (PF) 1 % IJ SOLN
INTRAMUSCULAR | Status: DC | PRN
  Administered 2018-01-26: 60 mL

## 2018-01-26 SURGICAL SUPPLY — 19 items
BALLN ATTAIN 80 (BALLOONS) ×2
BALLN ATTAIN 80CM 6215 (BALLOONS) ×1
BALLOON ATTAIN 80 (BALLOONS) IMPLANT
CABLE SURGICAL S-101-97-12 (CABLE) ×3 IMPLANT
CATH ATTAIN COM SURV 6250V-MB2 (CATHETERS) ×2 IMPLANT
CATH ATTAIN SEL SURV 6248V-90 (CATHETERS) ×2 IMPLANT
ICD CLARIA MRI DTMA1QQ (ICD Generator) ×2 IMPLANT
LEAD ATTAIN PERFORMA S 4598-88 (Lead) ×2 IMPLANT
LEAD CAPSURE NOVUS 5076-52CM (Lead) ×2 IMPLANT
LEAD SPRINT QUAT SEC 6935M-62 (Lead) ×2 IMPLANT
PAD PRO RADIOLUCENT 2001M-C (PAD) ×3 IMPLANT
SHEATH CLASSIC 7F (SHEATH) ×2 IMPLANT
SHEATH CLASSIC 9.5F (SHEATH) ×2 IMPLANT
SHEATH CLASSIC 9F (SHEATH) ×2 IMPLANT
SLITTER 6232ADJ (MISCELLANEOUS) ×2 IMPLANT
TRAY PACEMAKER INSERTION (PACKS) ×3 IMPLANT
WIRE ACUITY WHISPER EDS 4648 (WIRE) ×4 IMPLANT
WIRE HI TORQ VERSACORE-J 145CM (WIRE) ×2 IMPLANT
WIRE MAILMAN 182CM (WIRE) ×4 IMPLANT

## 2018-01-26 NOTE — Progress Notes (Signed)
Call placed to cath-lab to come evaluate site, presents with some discomfort and noticeable swelling.   Cath-lab staff comes to bedside, applies pressure dressing.   Tylenol given to patient. Daily meds held until cards follow up.

## 2018-01-26 NOTE — Progress Notes (Signed)
SCDs requested for patient.

## 2018-01-26 NOTE — Progress Notes (Signed)
Patient arrives to 3e11, telemetry initiated. Pt is c/a/ox3, frequent vitals initiated.

## 2018-01-26 NOTE — H&P (Addendum)
ICD Criteria  Current LVEF:33%. Within 12 months prior to implant: Yes   Heart failure history: Yes, Class II  Cardiomyopathy history: Yes, Non-Ischemic Cardiomyopathy.  Atrial Fibrillation/Atrial Flutter: No.  Ventricular tachycardia history: No.  Cardiac arrest history: No.  History of syndromes with risk of sudden death: No.  Previous ICD: No.  Current ICD indication: Primary  PPM indication: No.  Class I or II Bradycardia indication present: No  Beta Blocker therapy for 3 or more months: Yes, prescribed.   Ace Inhibitor/ARB therapy for 3 or more months: Yes, prescribed.    I have seen Carlos Gibson is a 77 y.o. malepre-procedural and has been referred by [name] for consideration of ICD implant for primary prevention of sudden death.  The patient's chart has been reviewed and they meet criteria for ICD implant.  I have had a thorough discussion with the patient reviewing options.  The patient and their family (if available) have had opportunities to ask questions and have them answered. The patient and I have decided together through the [employer] to implant ICD at this time.  Risks, benefits, alternatives to ICD implantation were discussed in detail with the patient today. The patient  understands that the risks include but are not limited to bleeding, infection, pneumothorax, perforation, tamponade, vascular damage, renal failure, MI, stroke, death, inappropriate shocks, and lead dislodgement and  wishes to proceed.

## 2018-01-26 NOTE — Discharge Instructions (Signed)
° ° °  Supplemental Discharge Instructions for  Pacemaker/Defibrillator Patients  Activity No heavy lifting or vigorous activity with your left/right arm for 6 to 8 weeks.  Do not raise your left/right arm above your head for one week.  Gradually raise your affected arm as drawn below.              01/30/18                  01/31/18                  02/01/18                 02/02/18 __  NO DRIVING for 1 week   ; you may begin driving on 17/00/17  .  WOUND CARE - Keep the wound area clean and dry.  Do not get this area wet, no showers until after your wound check visit  . - The tape/steri-strips on your wound will fall off; do not pull them off.  No bandage is needed on the site.  DO  NOT apply any creams, oils, or ointments to the wound area. - If you notice any drainage or discharge from the wound, any swelling or bruising at the site, or you develop a fever > 101? F after you are discharged home, call the office at once.  Special Instructions - You are still able to use cellular telephones; use the ear opposite the side where you have your pacemaker/defibrillator.  Avoid carrying your cellular phone near your device. - When traveling through airports, show security personnel your identification card to avoid being screened in the metal detectors.  Ask the security personnel to use the hand wand. - Avoid arc welding equipment, MRI testing (magnetic resonance imaging), TENS units (transcutaneous nerve stimulators).  Call the office for questions about other devices. - Avoid electrical appliances that are in poor condition or are not properly grounded. - Microwave ovens are safe to be near or to operate.  Additional information for defibrillator patients should your device go off: - If your device goes off ONCE and you feel fine afterward, notify the device clinic nurses. - If your device goes off ONCE and you do not feel well afterward, call 911. - If your device goes off TWICE, call  911. - If your device goes off THREE times in one day, call 911.  DO NOT DRIVE YOURSELF OR A FAMILY MEMBER WITH A DEFIBRILLATOR TO THE HOSPITAL--CALL 911.

## 2018-01-26 NOTE — Discharge Summary (Addendum)
ELECTROPHYSIOLOGY PROCEDURE DISCHARGE SUMMARY    Patient ID: Carlos Gibson,  MRN: [id number], DOB/AGE: 11/28/1940 77 y.o.  Admit date: 01/26/2018 Discharge date: 01/27/18  Primary Care Physician: [name], MD  Primary Cardiologist: Dr. Marlou Porch Electrophysiologist: Dr. Curt Bears  Primary Discharge Diagnosis:  1. NICM 2. Conduction system disease     Trifasicular block 3. RBBB  Secondary Discharge Diagnosis:  1. Chronic CHF (systolic) 2. PVCs 3. DM 4. Gilbert's Syndrome 5. HLD 6. Thrombocytopenia 7. CKD (III) 8. AFlutter (typical)     CHA2DS2Vasc is 4, on Eliquis, appropriately dosed   Allergies  Allergen Reactions  . Ace Inhibitors Cough  . Coreg [Carvedilol] Other (See Comments)    "feels bad"  . Toprol Xl [Metoprolol Tartrate] Other (See Comments)    "feels bad"     Procedures This Admission:  1.  Implantation of a MDT CRT-D on 01/26/18 by Dr Curt Bears.  The patient received a Medtronic model [id number] (serial # [id number] ) right atrial lead and a Medtronic model [id number] (serial number [id number]) right ventricular defibrillator lead, Medtronic model 2751 - 88 (serial number [id number] ) lead (LV) DFT's were deferred at time of implant There were no immediate post procedure complications. 2.  CXR on 01/27/18 demonstrated no pneumothorax status post device implantation.   Brief HPI: Carlos Gibson is a 77 y.o. male was referred to electrophysiology in the outpatient setting for consideration of ICD implantation.  Past medical history includes above.  The patient has persistent LV dysfunction despite guideline directed therapy.  Risks, benefits, and alternatives to ICD implantation were reviewed with the patient who wished to proceed.   Hospital Course:  The patient was admitted and underwent implantation of a CRT-D with details as outlined above. He was monitored on telemetry overnight which demonstrated AFlutter, V paced.  Left chest was without hematoma or  ecchymosis.  The device was interrogated and found to be functioning normally.  CXR was obtained and demonstrated no pneumothorax status post device implantation.  Wound care, arm mobility, and restrictions were reviewed with the patient.  The patient feels well this morning, no CP or SOB, no discomfort at implant site, he was examined by [facility] and considered stable for discharge to home.   He Carlos Gibson be seen for routine post implant follow up as well as in follow up to revisit timing of AFlutter ablation once he has been on his Eliquis without interruption for 3 weeks. The patient mentioned he ha no Eliquis 2/2 cost I have called the office to have some samples placed aside for the patient as well as patient assistance paperwork.  We discussed the importance of hs Eliquis and to go across the street to pick up the samples and paperwork  The patient's discharge medications include an ARB (losartan) and beta blocker (bisoprolol).   Physical Exam: Vitals:   01/27/18 0004 01/27/18 0530 01/27/18 0601 01/27/18 0808  BP: 132/79  138/66 136/68  Pulse: 80  76 68  Resp: 18  18 16   Temp: 98.1 F (36.7 C)  98.1 F (36.7 C) 97.8 F (36.6 C)  TempSrc: Oral  Oral Oral  SpO2: 97%  97% 97%  Weight:  80.8 kg    Height:        GEN- The patient is well appearing, alert and oriented x 3 today.   HEENT: normocephalic, atraumatic; sclera clear, conjunctiva pink; hearing intact; oropharynx clear Lungs- CTA b/l, normal work of breathing.  No wheezes, rales, rhonchi Heart-  RRR (  paced), no murmurs, rubs or gallops, PMI not laterally displaced GI- soft, non-tender, non-distended Extremities- no clubbing, cyanosis, or edema MS- no significant deformity or atrophy Skin- warm and dry, no rash or lesion, left chest without hematoma/ecchymosis Psych- euthymic mood, full affect Neuro- no gross defecits  Labs:   Lab Results  Component Value Date   WBC 5.3 01/11/2018   HGB 12.8 (L) 01/11/2018   HCT 37.9  01/11/2018   MCV 83 01/11/2018   PLT 136 (L) 01/11/2018   No results for input(s): NA, K, CL, CO2, BUN, CREATININE, CALCIUM, PROT, BILITOT, ALKPHOS, ALT, AST, GLUCOSE in the last 168 hours.  Invalid input(s): LABALBU  Discharge Medications:  Allergies as of 01/27/2018      Reactions   Ace Inhibitors Cough   Coreg [carvedilol] Other (See Comments)   "feels bad"   Toprol Xl [metoprolol Tartrate] Other (See Comments)   "feels bad"      Medication List    STOP taking these medications   aspirin EC 81 MG tablet     TAKE these medications   alfuzosin 10 MG 24 hr tablet Commonly known as:  UROXATRAL Take 10 mg by mouth at bedtime.   apixaban 5 MG Tabs tablet Commonly known as:  ELIQUIS Take 1 tablet (5 mg total) by mouth 2 (two) times daily. Notes to patient:  Resume this evening.  Please go across the street to Dr. Macky Lower office to pick up samples and patient cost assistance paperwork as we discussed   atorvastatin 10 MG tablet Commonly known as:  LIPITOR Take 10 mg by mouth at bedtime.   bisoprolol 5 MG tablet Commonly known as:  ZEBETA TAKE 1 TABLET BY MOUTH EVERY DAY   finasteride 5 MG tablet Commonly known as:  PROSCAR Take 5 mg by mouth daily.   furosemide 40 MG tablet Commonly known as:  LASIX Take 2 tablets in the morning What changed:    how much to take  how to take this  when to take this  additional instructions   glimepiride 2 MG tablet Commonly known as:  AMARYL Take 2 mg by mouth daily with breakfast.   losartan 25 MG tablet Commonly known as:  COZAAR Take 25 mg by mouth daily.   potassium chloride 10 MEQ tablet Commonly known as:  K-DUR,KLOR-CON Take 2 tablets (20 mEq total) by mouth daily.   spironolactone 25 MG tablet Commonly known as:  ALDACTONE Take 0.5 tablets (12.5 mg total) by mouth daily.   Vitamin D3 5000 units Caps Take 5,000 Units by mouth every other day.       Disposition:  Home  Discharge Instructions     Diet - low sodium heart healthy   Complete by:  As directed    Increase activity slowly   Complete by:  As directed      Follow-up Information    [facility] Follow up on 02/09/2018.   Specialty:  Cardiology Why:  2:30PM, wound check visit Contact information: [address], [place]       Constance Haw, MD Follow up.   Specialty:  Cardiology Why:  02/22/18 at 12:15PM 05/02/18 at 10:45AM Contact information: [place] [address] [phone]           Duration of Discharge Encounter: Greater than 30 minutes including physician time.  SignedTommye Standard, PA-C 01/27/2018 10:09 AM  I have seen and examined this patient  with Tommye Standard.  Agree with above, note added to reflect my findings.  On exam, RRR, no murmurs, lungs clear.  Was admitted to the hospital initially for atrial flutter ablation and ICD implant.  The patient had not taken his Eliquis for the last 2 days and thus ablation was postponed.  He did have implant of a Medtronic CRT-D.  Device interrogation and chest x-ray without issue.  Plan for discharge today with follow-up in device clinic.  Carlos Beville M. Kamali Sakata MD 01/27/2018 10:41 AM

## 2018-01-26 NOTE — Plan of Care (Signed)
  Problem: Education: Goal: Knowledge of General Education information will improve Description Including pain rating scale, medication(s)/side effects and non-pharmacologic comfort measures Outcome: Progressing   Problem: Health Behavior/Discharge Planning: Goal: Ability to manage health-related needs will improve Outcome: Progressing   Problem: Clinical Measurements: Goal: Cardiovascular complication will be avoided Outcome: Progressing   Problem: Activity: Goal: Risk for activity intolerance will decrease Outcome: Progressing   Problem: Education: Goal: Knowledge of cardiac device and self-care will improve Outcome: Progressing

## 2018-01-26 NOTE — Progress Notes (Signed)
EP staff called to look at ICD site due to increased pain and swelling at site.  Site with soft swelling at base of pocket, no discoloration noted.  Pressure dressing applied 4X4s and hypafix tape.  Vick Frees, PA advised, she will see pt shortly.   Shaaron Adler, RN

## 2018-01-27 ENCOUNTER — Ambulatory Visit (HOSPITAL_COMMUNITY): Payer: Medicare Other

## 2018-01-27 DIAGNOSIS — Z95810 Presence of automatic (implantable) cardiac defibrillator: Secondary | ICD-10-CM | POA: Diagnosis not present

## 2018-01-27 DIAGNOSIS — I5022 Chronic systolic (congestive) heart failure: Secondary | ICD-10-CM | POA: Diagnosis not present

## 2018-01-27 DIAGNOSIS — I483 Typical atrial flutter: Secondary | ICD-10-CM | POA: Diagnosis not present

## 2018-01-27 DIAGNOSIS — E785 Hyperlipidemia, unspecified: Secondary | ICD-10-CM | POA: Diagnosis not present

## 2018-01-27 DIAGNOSIS — N183 Chronic kidney disease, stage 3 (moderate): Secondary | ICD-10-CM | POA: Diagnosis not present

## 2018-01-27 DIAGNOSIS — I272 Pulmonary hypertension, unspecified: Secondary | ICD-10-CM | POA: Diagnosis not present

## 2018-01-27 DIAGNOSIS — I428 Other cardiomyopathies: Secondary | ICD-10-CM

## 2018-01-27 DIAGNOSIS — D696 Thrombocytopenia, unspecified: Secondary | ICD-10-CM | POA: Diagnosis not present

## 2018-01-27 DIAGNOSIS — E1122 Type 2 diabetes mellitus with diabetic chronic kidney disease: Secondary | ICD-10-CM | POA: Diagnosis not present

## 2018-01-27 DIAGNOSIS — I080 Rheumatic disorders of both mitral and aortic valves: Secondary | ICD-10-CM | POA: Diagnosis not present

## 2018-01-27 DIAGNOSIS — Z006 Encounter for examination for normal comparison and control in clinical research program: Secondary | ICD-10-CM | POA: Diagnosis not present

## 2018-01-27 DIAGNOSIS — Z23 Encounter for immunization: Secondary | ICD-10-CM | POA: Diagnosis not present

## 2018-01-27 DIAGNOSIS — I452 Bifascicular block: Secondary | ICD-10-CM | POA: Diagnosis not present

## 2018-01-27 NOTE — Plan of Care (Signed)
  Problem: Education: Goal: Knowledge of General Education information will improve Description Including pain rating scale, medication(s)/side effects and non-pharmacologic comfort measures Outcome: Adequate for Discharge   Problem: Health Behavior/Discharge Planning: Goal: Ability to manage health-related needs will improve Outcome: Adequate for Discharge   Problem: Clinical Measurements: Goal: Ability to maintain clinical measurements within normal limits will improve Outcome: Adequate for Discharge   Problem: Activity: Goal: Risk for activity intolerance will decrease Outcome: Adequate for Discharge   Problem: Nutrition: Goal: Adequate nutrition will be maintained Outcome: Adequate for Discharge   Problem: Coping: Goal: Level of anxiety will decrease Outcome: Adequate for Discharge   Problem: Elimination: Goal: Will not experience complications related to bowel motility Outcome: Adequate for Discharge   Problem: Pain Managment: Goal: General experience of comfort will improve Outcome: Adequate for Discharge   Problem: Safety: Goal: Ability to remain free from injury will improve Outcome: Adequate for Discharge   Problem: Skin Integrity: Goal: Risk for impaired skin integrity will decrease Outcome: Adequate for Discharge   Problem: Education: Goal: Knowledge of cardiac device and self-care will improve Outcome: Adequate for Discharge   Problem: Cardiac: Goal: Ability to achieve and maintain adequate cardiopulmonary perfusion will improve Outcome: Adequate for Discharge   Problem: Education: Goal: Ability to demonstrate management of disease process will improve Outcome: Adequate for Discharge   Problem: Activity: Goal: Capacity to carry out activities will improve Outcome: Adequate for Discharge   Problem: Cardiac: Goal: Ability to achieve and maintain adequate cardiopulmonary perfusion will improve Outcome: Adequate for Discharge

## 2018-01-28 ENCOUNTER — Telehealth: Payer: Self-pay

## 2018-01-28 ENCOUNTER — Other Ambulatory Visit: Payer: Self-pay

## 2018-01-28 MED ORDER — APIXABAN 5 MG PO TABS: 5.0000 mg | ORAL_TABLET | Freq: Two times a day (BID) | ORAL | 6 refills | Status: DC

## 2018-01-28 MED FILL — Lidocaine HCl Local Inj 1%: INTRAMUSCULAR | Qty: 60 | Status: AC

## 2018-01-28 NOTE — Telephone Encounter (Signed)
I called the pts pharmacy [facility]) and was advised that the pt has not met his deductible for the year so his co-pay on Eliquis is $427 for a 30 day supply.  I called the pt to discuss and he states that he cannot afford $427 a month and that he may be interested in taking Coumadin.  He is advised that I will forward this message to Dr Curt Bears and his nurse for advisement to him.

## 2018-01-28 NOTE — Telephone Encounter (Signed)
**Note De-identified Dallana Mavity Obfuscation** -----  **Note De-Identified Gorman Safi Obfuscation** Message from Carlos Kidney, RN sent at 01/27/2018 11:06 AM EDT ----- Regarding: Eliquis pt assistance Please contact pt about cost assistance for Eliquis. Pt states it cost him $400/mo  (Ablation procedure cancelled yesterday because he had not been taking this medication d/t cost)  thx

## 2018-01-31 ENCOUNTER — Telehealth: Payer: Self-pay | Admitting: Cardiology

## 2018-01-31 NOTE — Telephone Encounter (Signed)
New Message         Patient  Is calling today to check to see when he should come in for a wound check? Pls call to advise.

## 2018-01-31 NOTE — Telephone Encounter (Signed)
Returned call to patient. Patient wanting to know when his appointment was for his wound check. Made patient aware that his appointment is on 02/09/18 at 2:30 PM.   Patient also states that when he lays on his right side his heart beats a little fast. He denies any additional concerns at this time. Patient still has appointment with Dr. Curt Bears to discuss timing for aflutter ablation on 11/19. Will forward to Dr. Curt Bears RN as Juluis Rainier.

## 2018-02-03 ENCOUNTER — Other Ambulatory Visit: Payer: Self-pay | Admitting: Cardiology

## 2018-02-03 MED ORDER — WARFARIN SODIUM 5 MG PO TABS: 5.0000 mg | ORAL_TABLET | Freq: Every day | ORAL | 0 refills | Status: DC

## 2018-02-03 NOTE — Telephone Encounter (Signed)
Pt never started the Eliquis. Pt advised to start Coumadin 5mg  once every evening. Pt scheduled to follow up in Coumadin clinic 11/7. Patient verbalized understanding and agreeable to plan.

## 2018-02-04 ENCOUNTER — Encounter (INDEPENDENT_AMBULATORY_CARE_PROVIDER_SITE_OTHER): Payer: Self-pay

## 2018-02-04 ENCOUNTER — Telehealth: Payer: Self-pay

## 2018-02-04 ENCOUNTER — Ambulatory Visit (INDEPENDENT_AMBULATORY_CARE_PROVIDER_SITE_OTHER): Payer: Medicare Other | Admitting: *Deleted

## 2018-02-04 DIAGNOSIS — I428 Other cardiomyopathies: Secondary | ICD-10-CM

## 2018-02-04 NOTE — Telephone Encounter (Signed)
Pt calling because his wound site is swelling.

## 2018-02-04 NOTE — Telephone Encounter (Signed)
Pt scheduled for 11:00am Device Clinic apt. To assess wound site.

## 2018-02-04 NOTE — Progress Notes (Signed)
Pt seen d/t c/o of swelling at device site, site assessed no abnormal swelling noted, dressing dry intact no drainage noted. Pt stated that he had just taken his first does of coumadin last night. Informed pt to call device clinic back if drainage starts or site increases in size pt voiced understanding and appreciative of being seen today.

## 2018-02-08 DIAGNOSIS — E1169 Type 2 diabetes mellitus with other specified complication: Secondary | ICD-10-CM | POA: Diagnosis not present

## 2018-02-08 DIAGNOSIS — I10 Essential (primary) hypertension: Secondary | ICD-10-CM | POA: Diagnosis not present

## 2018-02-08 DIAGNOSIS — I5020 Unspecified systolic (congestive) heart failure: Secondary | ICD-10-CM | POA: Diagnosis not present

## 2018-02-09 ENCOUNTER — Ambulatory Visit (INDEPENDENT_AMBULATORY_CARE_PROVIDER_SITE_OTHER): Payer: Medicare Other | Admitting: *Deleted

## 2018-02-09 DIAGNOSIS — I428 Other cardiomyopathies: Secondary | ICD-10-CM | POA: Diagnosis not present

## 2018-02-09 LAB — CUP PACEART INCLINIC DEVICE CHECK
Battery Voltage: 3.04 V
Brady Statistic AP VP Percent: 0 %
Brady Statistic AS VP Percent: 77.93 %
Brady Statistic AS VS Percent: 22.07 %
Brady Statistic RV Percent Paced: 77.89 %
Date Time Interrogation Session: 20191106172201
HighPow Impedance: 52 Ohm
Implantable Lead Implant Date: 20191023
Implantable Lead Implant Date: 20191023
Implantable Lead Implant Date: 20191023
Implantable Lead Location: 753862
Implantable Lead Model: 4598
Implantable Lead Model: 6935
Implantable Pulse Generator Implant Date: 20191023
Lead Channel Impedance Value: 221.294
Lead Channel Impedance Value: 323 Ohm
Lead Channel Impedance Value: 342 Ohm
Lead Channel Impedance Value: 399 Ohm
Lead Channel Impedance Value: 513 Ohm
Lead Channel Impedance Value: 627 Ohm
Lead Channel Impedance Value: 627 Ohm
Lead Channel Impedance Value: 912 Ohm
Lead Channel Pacing Threshold Amplitude: 0.75 V
Lead Channel Pacing Threshold Amplitude: 1 V
Lead Channel Pacing Threshold Pulse Width: 0.4 ms
Lead Channel Pacing Threshold Pulse Width: 0.8 ms
Lead Channel Sensing Intrinsic Amplitude: 19.875 mV
Lead Channel Setting Pacing Amplitude: 2.5 V
Lead Channel Setting Pacing Amplitude: 3.5 V
Lead Channel Setting Pacing Amplitude: 3.5 V
Lead Channel Setting Pacing Pulse Width: 0.8 ms
Lead Channel Setting Sensing Sensitivity: 0.3 mV
MDC IDC LEAD LOCATION: 753859
MDC IDC LEAD LOCATION: 753860
MDC IDC MSMT BATTERY REMAINING LONGEVITY: 92 mo
MDC IDC MSMT LEADCHNL LV IMPEDANCE VALUE: 171 Ohm
MDC IDC MSMT LEADCHNL LV IMPEDANCE VALUE: 184.154
MDC IDC MSMT LEADCHNL LV IMPEDANCE VALUE: 221.294
MDC IDC MSMT LEADCHNL LV IMPEDANCE VALUE: 243.833
MDC IDC MSMT LEADCHNL LV IMPEDANCE VALUE: 342 Ohm
MDC IDC MSMT LEADCHNL LV IMPEDANCE VALUE: 513 Ohm
MDC IDC MSMT LEADCHNL LV IMPEDANCE VALUE: 646 Ohm
MDC IDC MSMT LEADCHNL LV IMPEDANCE VALUE: 893 Ohm
MDC IDC MSMT LEADCHNL LV IMPEDANCE VALUE: 912 Ohm
MDC IDC MSMT LEADCHNL RA SENSING INTR AMPL: 4.125 mV
MDC IDC MSMT LEADCHNL RV IMPEDANCE VALUE: 437 Ohm
MDC IDC SET LEADCHNL RV PACING PULSEWIDTH: 0.4 ms
MDC IDC STAT BRADY AP VS PERCENT: 0 %
MDC IDC STAT BRADY RA PERCENT PACED: 0 %

## 2018-02-09 NOTE — Patient Instructions (Signed)
Call [facility] with Monitor serial number:  [id number]

## 2018-02-09 NOTE — Progress Notes (Signed)
Wound check appointment. Steri-strips removed. Wound without redness. Slight edema, soft to palpate, no ecchymosis or no drainage. Incision edges approximated, wound well healed. Wound re-check scheduled 02/15/2018.   Normal device function. Thresholds, sensing, and impedances consistent with implant measurements. Device programmed at 3.5V for extra safety margin until 3 month visit. Histogram distribution appropriate for patient and level of activity. No mode switches or ventricular arrhythmias noted. Patient educated about wound care, arm mobility, lifting restrictions, shock plan. ROV with WC 02/22/18.

## 2018-02-10 ENCOUNTER — Ambulatory Visit (INDEPENDENT_AMBULATORY_CARE_PROVIDER_SITE_OTHER): Payer: Medicare Other

## 2018-02-10 DIAGNOSIS — I4892 Unspecified atrial flutter: Secondary | ICD-10-CM | POA: Insufficient documentation

## 2018-02-10 DIAGNOSIS — I483 Typical atrial flutter: Secondary | ICD-10-CM

## 2018-02-10 DIAGNOSIS — Z7901 Long term (current) use of anticoagulants: Secondary | ICD-10-CM | POA: Diagnosis not present

## 2018-02-10 DIAGNOSIS — Z5181 Encounter for therapeutic drug level monitoring: Secondary | ICD-10-CM | POA: Insufficient documentation

## 2018-02-10 LAB — POCT INR: INR: 2.1 (ref 2.0–3.0)

## 2018-02-10 MED ORDER — WARFARIN SODIUM 5 MG PO TABS: ORAL_TABLET | ORAL | 1 refills | Status: DC

## 2018-02-10 NOTE — Patient Instructions (Signed)
Description   Continue on same dosage 1 tablet daily.  Recheck on Tuesday.     A full discussion of the nature of anticoagulants has been carried out.  A benefit risk analysis has been presented to the patient, so that they understand the justification for choosing anticoagulation at this time. The need for frequent and regular monitoring, precise dosage adjustment and compliance is stressed.  Side effects of potential bleeding are discussed.  The patient should avoid any OTC items containing aspirin or ibuprofen, and should avoid great swings in general diet.  Avoid alcohol consumption.  Call if any signs of abnormal bleeding.

## 2018-02-15 ENCOUNTER — Ambulatory Visit (INDEPENDENT_AMBULATORY_CARE_PROVIDER_SITE_OTHER): Payer: Medicare Other | Admitting: *Deleted

## 2018-02-15 DIAGNOSIS — Z7901 Long term (current) use of anticoagulants: Secondary | ICD-10-CM | POA: Diagnosis not present

## 2018-02-15 DIAGNOSIS — I483 Typical atrial flutter: Secondary | ICD-10-CM

## 2018-02-15 DIAGNOSIS — I428 Other cardiomyopathies: Secondary | ICD-10-CM

## 2018-02-15 LAB — POCT INR: INR: 2.7 (ref 2.0–3.0)

## 2018-02-15 NOTE — Patient Instructions (Signed)
Description   Continue taking 1 tablet daily.  Recheck in 1 week. Coumadin Clinic [phone]

## 2018-02-15 NOTE — Progress Notes (Signed)
Wound re-check no edema noted, no drainage, no bruising noted. Incision site well healed. ROV as scheduled w/ WC 02/22/2018

## 2018-02-22 ENCOUNTER — Ambulatory Visit (INDEPENDENT_AMBULATORY_CARE_PROVIDER_SITE_OTHER): Payer: Medicare Other | Admitting: Pharmacist

## 2018-02-22 ENCOUNTER — Encounter: Payer: Medicare Other | Admitting: Cardiology

## 2018-02-22 DIAGNOSIS — Z7901 Long term (current) use of anticoagulants: Secondary | ICD-10-CM

## 2018-02-22 DIAGNOSIS — I483 Typical atrial flutter: Secondary | ICD-10-CM

## 2018-02-22 LAB — POCT INR: INR: 3.7 — AB (ref 2.0–3.0)

## 2018-02-22 NOTE — Progress Notes (Deleted)
Electrophysiology Office Note   Date:  02/22/2018   ID:  Carlos Gibson, DOB [birth year], MRN [id number]  PCP:  [name], MD  Cardiologist:  Marlou Porch Primary Electrophysiologist:  Constance Haw, MD    No chief complaint on file.    History of Present Illness: Carlos Gibson is a 77 y.o. male who presents today for electrophysiology evaluation.   Hx NICM/chronic systolic CHF (LHC 08/4268 with normal cors &EF 25-30%; last echo 05/2015 with EF 35-40%), intolerance to prior Toprol/carvedilol due to "feeling bad," secondary pulmonary hypertension, frequent PVCs, aortic root dilatation, Trifasicular block (RBBB + LAFB + 1st degree AVB), diabetes, Gilbert's syndrome, mild AI/MR by echo 05/2015, hyperlipidemia, thrombocytopenia, probable CKD stage III.  He was put on Entresto.  His ejection fraction has since improved to 40-45%.  Today, denies symptoms of palpitations, chest pain, shortness of breath, orthopnea, PND, lower extremity edema, claudication, dizziness, presyncope, syncope, bleeding, or neurologic sequela. The patient is tolerating medications without difficulties. ***    Past Medical History:  Diagnosis Date  . Aortic regurgitation    a. mild AI by echo 01/2016.  [name] Aortic root dilation ([facility])   . Aortic valve regurgitation   . Bifascicular block   . Chronic systolic CHF (congestive heart failure) ([place])   . CKD (chronic kidney disease), stage III ([place]) 01/01/2016  . Diabetes ([place])   . Ejection fraction < 50%   . Frequent PVCs   . Gilbert's syndrome   . Hyperlipidemia   . Mild diastolic dysfunction   . Mitral regurgitation    a. mod by echo 10/207.  [name] NICM (nonischemic cardiomyopathy) ([place])   . Pericardial effusion    a. small by echo 01/2016.  . Pulmonary hypertension ([place])   . Right BBB/left post fasc block   . Thrombocytopenia ([place])    Past Surgical History:  Procedure Laterality Date  . BIV ICD INSERTION CRT-D N/A 01/26/2018   Procedure: BIV ICD INSERTION  CRT-D;  Surgeon: Constance Haw, MD;  Location: [facility];  Service: Cardiovascular;  Laterality: N/A;  . CARDIAC CATHETERIZATION N/A 12/20/2014   Procedure: Right/Left Heart Cath and Coronary Angiography;  Surgeon: Jerline Pain, MD;  Location: [facility];  Service: Cardiovascular;  Laterality: N/A;  . HERNIA REPAIR  06/2017     Current Outpatient Medications  Medication Sig Dispense Refill  . alfuzosin (UROXATRAL) 10 MG 24 hr tablet Take 10 mg by mouth at bedtime.   3  . atorvastatin (LIPITOR) 10 MG tablet Take 10 mg by mouth at bedtime.    . bisoprolol (ZEBETA) 5 MG tablet TAKE 1 TABLET BY MOUTH EVERY DAY 90 tablet 3  . Cholecalciferol (VITAMIN D3) 5000 units CAPS Take 5,000 Units by mouth every other day.    . finasteride (PROSCAR) 5 MG tablet Take 5 mg by mouth daily.   2  . furosemide (LASIX) 40 MG tablet Take 2 tablets in the morning (Patient taking differently: Take 40 mg by mouth See admin instructions. Take 40 mg once daily, may take up to 80 mg once daily as needed for swelling) 45 tablet 11  . glimepiride (AMARYL) 2 MG tablet Take 2 mg by mouth daily with breakfast.    . losartan (COZAAR) 25 MG tablet Take 25 mg by mouth daily.  3  . potassium chloride (K-DUR,KLOR-CON) 10 MEQ tablet Take 2 tablets (20 mEq total) by mouth daily. 60 tablet 11  . spironolactone (ALDACTONE) 25 MG tablet Take 0.5 tablets (12.5  mg total) by mouth daily. 30 tablet 11  . warfarin (COUMADIN) 5 MG tablet Take as directed by Coumadin Clinic 35 tablet 1   No current facility-administered medications for this visit.     Allergies:   Ace inhibitors; Coreg [carvedilol]; and Toprol xl [metoprolol tartrate]   Social History:  The patient  reports that he has never smoked. He has never used smokeless tobacco. He reports that he does not drink alcohol or use drugs.   Family History:  The patient's family history includes Hypertension in his mother; Irregular heart beat in his father; Stroke  in his mother; Unexplained death in his father.    ROS:  Please see the history of present illness.   Otherwise, review of systems is positive for ***.   All other systems are reviewed and negative.   PHYSICAL EXAM: VS:  There were no vitals taken for this visit. , BMI There is no height or weight on file to calculate BMI. GEN: Well nourished, well developed, in no acute distress  HEENT: normal  Neck: no JVD, carotid bruits, or masses Cardiac: ***RRR; no murmurs, rubs, or gallops,no edema  Respiratory:  clear to auscultation bilaterally, normal work of breathing GI: soft, nontender, nondistended, + BS MS: no deformity or atrophy  Skin: warm and dry, ***device site well healed Neuro:  Strength and sensation are intact Psych: euthymic mood, full affect  EKG:  EKG {ACTION; IS/IS [id number]} ordered today. Personal review of the ekg ordered *** shows ***  ***Personal review of the device interrogation today. Results in [place]: 11/09/2017: NT-Pro BNP 3,222 01/11/2018: BUN 21; Creatinine, Ser 1.63; Hemoglobin 12.8; Platelets 136; Potassium 4.5; Sodium 144    Lipid Panel  No results found for: CHOL, TRIG, HDL, CHOLHDL, VLDL, LDLCALC, LDLDIRECT   Wt Readings from Last 3 Encounters:  01/27/18 178 lb 3.2 oz (80.8 kg)  01/11/18 175 lb (79.4 kg)  12/24/17 181 lb (82.1 kg)      Other studies Reviewed: Additional studies/ records that were reviewed today include: CMRI  Review of the above records today demonstrates:  1. Severely dilated left ventricle with normal wall thickness and severely reduced systolic function (LVEF = 34%). There is diffuse hypokinesis.  There is no late gadolinium enhancement in the left ventricular myocardium.  2. Mildly dilated right ventricular size with normal wall thickness and moderately decreased systolic function (LVEF = 32%). There are no regional wall motion abnormalities.  3.  Severe bi-atrial dilatation.  No left atrial  thrombus.  4. Normal size of the aortic root, ascending aorta. Dilated pulmonary artery measuring 34 mm.  5. Moderate mitral regurgitation with posteriorly directed jet. Mild to moderate tricuspid regurgitation.  6.  Normal pericardium.  Trivial pericardial effusion.  ASSESSMENT AND PLAN:  1.  Nonischemic cardiomyopathy: On OMT with lostartan and bisoprolol. Is now s/p Medtronic CRT-D implanted 01/26/18.***  2. PVCs: ***1% on his monitor.  Currently asymptomatic.  3. Hypertension: ***  4.  Atrial flutter: ***  Appears typical in nature.  He is currently on Eliquis.  We  plan for ablation.  Risks and benefits were discussed include bleeding, tamponade, heart block, stroke.  The patient understands the risks and is agreed to the procedure.  This patients CHA2DS2-VASc Score and unadjusted Ischemic Stroke Rate (% per year) is equal to 4.8 % stroke rate/year from a score of 4  Above score calculated as 1 point each if present [CHF, HTN, DM, Vascular=MI/PAD/Aortic Plaque, Age if 26-74, or Male] Above  score calculated as 2 points each if present [Age > 75, or Stroke/TIA/TE]   Current medicines are reviewed at length with the patient today.   The patient does not have concerns regarding his medicines.  The following changes were made today:  ***  Labs/ tests ordered today include:  No orders of the defined types were placed in this encounter.   Disposition:   FU with   *** months  Signed,  Meredith Leeds, MD  02/22/2018 7:31 AM     [facility] [address] [place]  [address] [phone] (office) [phone] (fax)

## 2018-02-22 NOTE — Patient Instructions (Signed)
Description   Hold warfarin today. Continue taking 1 tablet daily.  Recheck in 1 week. Coumadin Clinic [phone]

## 2018-03-02 ENCOUNTER — Ambulatory Visit (INDEPENDENT_AMBULATORY_CARE_PROVIDER_SITE_OTHER): Payer: Medicare Other | Admitting: *Deleted

## 2018-03-02 DIAGNOSIS — Z7901 Long term (current) use of anticoagulants: Secondary | ICD-10-CM | POA: Diagnosis not present

## 2018-03-02 DIAGNOSIS — I483 Typical atrial flutter: Secondary | ICD-10-CM

## 2018-03-02 LAB — POCT INR: INR: 2.4 (ref 2.0–3.0)

## 2018-03-02 NOTE — Patient Instructions (Signed)
Description   Continue taking 1 tablet daily.  Recheck INR in 1 week. Coumadin Clinic [phone]

## 2018-03-09 ENCOUNTER — Ambulatory Visit (INDEPENDENT_AMBULATORY_CARE_PROVIDER_SITE_OTHER): Payer: Medicare Other | Admitting: Pharmacist

## 2018-03-09 DIAGNOSIS — Z7901 Long term (current) use of anticoagulants: Secondary | ICD-10-CM

## 2018-03-09 DIAGNOSIS — I483 Typical atrial flutter: Secondary | ICD-10-CM | POA: Diagnosis not present

## 2018-03-09 LAB — POCT INR: INR: 2.8 (ref 2.0–3.0)

## 2018-03-09 NOTE — Patient Instructions (Signed)
Description   Continue taking 1 tablet daily.  Recheck INR in 1 week. Coumadin Clinic [phone]

## 2018-03-17 ENCOUNTER — Ambulatory Visit (INDEPENDENT_AMBULATORY_CARE_PROVIDER_SITE_OTHER): Payer: Medicare Other | Admitting: *Deleted

## 2018-03-17 DIAGNOSIS — I483 Typical atrial flutter: Secondary | ICD-10-CM | POA: Diagnosis not present

## 2018-03-17 DIAGNOSIS — Z7901 Long term (current) use of anticoagulants: Secondary | ICD-10-CM

## 2018-03-17 LAB — POCT INR: INR: 2.6 (ref 2.0–3.0)

## 2018-03-17 NOTE — Patient Instructions (Signed)
Description   Continue taking 1 tablet daily.  Recheck INR in 1 week. Coumadin Clinic [phone]

## 2018-03-21 ENCOUNTER — Other Ambulatory Visit: Payer: Self-pay | Admitting: Cardiology

## 2018-03-24 ENCOUNTER — Ambulatory Visit (INDEPENDENT_AMBULATORY_CARE_PROVIDER_SITE_OTHER): Payer: Medicare Other | Admitting: *Deleted

## 2018-03-24 DIAGNOSIS — Z7901 Long term (current) use of anticoagulants: Secondary | ICD-10-CM | POA: Diagnosis not present

## 2018-03-24 DIAGNOSIS — G629 Polyneuropathy, unspecified: Secondary | ICD-10-CM | POA: Diagnosis not present

## 2018-03-24 DIAGNOSIS — I483 Typical atrial flutter: Secondary | ICD-10-CM

## 2018-03-24 LAB — POCT INR: INR: 2.1 (ref 2.0–3.0)

## 2018-03-24 NOTE — Patient Instructions (Signed)
Description   Continue taking 1 tablet daily.  Recheck INR in 2 weeks. Coumadin Clinic [phone]

## 2018-04-07 DIAGNOSIS — I10 Essential (primary) hypertension: Secondary | ICD-10-CM | POA: Diagnosis not present

## 2018-04-07 DIAGNOSIS — M79671 Pain in right foot: Secondary | ICD-10-CM | POA: Diagnosis not present

## 2018-04-07 DIAGNOSIS — E1169 Type 2 diabetes mellitus with other specified complication: Secondary | ICD-10-CM | POA: Diagnosis not present

## 2018-04-14 ENCOUNTER — Ambulatory Visit (INDEPENDENT_AMBULATORY_CARE_PROVIDER_SITE_OTHER): Payer: Medicare Other | Admitting: *Deleted

## 2018-04-14 DIAGNOSIS — I483 Typical atrial flutter: Secondary | ICD-10-CM

## 2018-04-14 DIAGNOSIS — Z7901 Long term (current) use of anticoagulants: Secondary | ICD-10-CM | POA: Diagnosis not present

## 2018-04-14 LAB — POCT INR: INR: 2.7 (ref 2.0–3.0)

## 2018-04-14 NOTE — Patient Instructions (Signed)
Description   Continue taking 1 tablet daily.  Recheck INR in 3 weeks. Coumadin Clinic [phone]

## 2018-04-16 ENCOUNTER — Other Ambulatory Visit: Payer: Self-pay | Admitting: Cardiology

## 2018-04-26 ENCOUNTER — Other Ambulatory Visit: Payer: Self-pay | Admitting: Physician Assistant

## 2018-05-02 ENCOUNTER — Encounter: Payer: Medicare Other | Admitting: Cardiology

## 2018-05-02 NOTE — Progress Notes (Deleted)
Electrophysiology Office Note   Date:  05/02/2018   ID:  GLEASON ARDOIN, DOB [birth year], MRN [id number]  PCP:  [name], MD  Cardiologist:  Marlou Porch Primary Electrophysiologist:  Constance Haw, MD    No chief complaint on file.    History of Present Illness: Carlos Gibson is a 78 y.o. male who presents today for electrophysiology evaluation.   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  He was put on Entresto.  His ejection fraction has since improved to 40-45%.  Today, denies symptoms of palpitations, chest pain, shortness of breath, orthopnea, PND, lower extremity edema, claudication, dizziness, presyncope, syncope, bleeding, or neurologic sequela. The patient is tolerating medications without difficulties. ***    Past Medical History:  Diagnosis Date  . Aortic regurgitation    a. mild AI by echo 01/2016.  [name] Aortic root dilation (HCC)   . Aortic valve regurgitation   . Bifascicular block   . Chronic systolic CHF (congestive heart failure) ([place])   . CKD (chronic kidney disease), stage III ([place]) 01/01/2016  . Diabetes ([place])   . Ejection fraction < 50%   . Frequent PVCs   . Gilbert's syndrome   . Hyperlipidemia   . Mild diastolic dysfunction   . Mitral regurgitation    a. mod by echo 10/207.  [name] NICM (nonischemic cardiomyopathy) ([place])   . Pericardial effusion    a. small by echo 01/2016.  . Pulmonary hypertension ([place])   . Right BBB/left post fasc block   . Thrombocytopenia ([place])    Past Surgical History:  Procedure Laterality Date  . BIV ICD INSERTION CRT-D N/A 01/26/2018   Procedure: BIV ICD INSERTION  CRT-D;  Surgeon: Constance Haw, MD;  Location: [facility] [facility];  Service: Cardiovascular;  Laterality: N/A;  . CARDIAC CATHETERIZATION N/A 12/20/2014   Procedure: Right/Left Heart Cath and Coronary Angiography;  Surgeon: Jerline Pain, MD;  Location: [facility];  Service: Cardiovascular;  Laterality: N/A;  . HERNIA REPAIR  06/2017     Current Outpatient Medications  Medication Sig Dispense Refill  . alfuzosin (UROXATRAL) 10 MG 24 hr tablet Take 10 mg by mouth at bedtime.   3  . atorvastatin (LIPITOR) 10 MG tablet Take 10 mg by mouth at bedtime.    . bisoprolol (ZEBETA) 5 MG tablet TAKE 1 TABLET BY MOUTH EVERY DAY 90 tablet 3  . Cholecalciferol (VITAMIN D3) 5000 units CAPS Take 5,000 Units by mouth every other day.    . finasteride (PROSCAR) 5 MG tablet Take 5 mg by mouth daily.   2  . furosemide (LASIX) 40 MG tablet Take 2 tablets in the morning (Patient taking differently: Take 40 mg by mouth See admin instructions. Take 40 mg once daily, may take up to 80 mg once daily as needed for swelling) 45 tablet 11  . glimepiride (AMARYL) 2 MG tablet Take 2 mg by mouth daily with breakfast.    . losartan (COZAAR) 25 MG tablet TAKE 1 TABLET(25 MG) BY MOUTH DAILY 90 tablet 2  . potassium chloride (K-DUR,KLOR-CON) 10 MEQ tablet Take 2 tablets (20 mEq total) by mouth daily. 60 tablet 11  . spironolactone (ALDACTONE) 25 MG tablet TAKE 1/2  TABLET(12.5 MG) BY MOUTH DAILY 45 tablet 2  . warfarin (COUMADIN) 5 MG tablet TAKE 1 TABLET BY MOUTH DAILY 30 tablet 1   No current facility-administered medications for this visit.     Allergies:   Ace inhibitors; Coreg [carvedilol]; and Toprol xl [metoprolol tartrate]   Social History:  The patient  reports that he has never smoked. He has never used smokeless tobacco. He reports that he does not drink alcohol or use drugs.   Family History:  The patient's family history includes Hypertension in his mother; Irregular heart beat in his father;  Stroke in his mother; Unexplained death in his father.    ROS:  Please see the history of present illness.   Otherwise, review of systems is positive for ***.   All other systems are reviewed and negative.   PHYSICAL EXAM: VS:  There were no vitals taken for this visit. , BMI There is no height or weight on file to calculate BMI. GEN: Well nourished, well developed, in no acute distress  HEENT: normal  Neck: no JVD, carotid bruits, or masses Cardiac: ***RRR; no murmurs, rubs, or gallops,no edema  Respiratory:  clear to auscultation bilaterally, normal work of breathing GI: soft, nontender, nondistended, + BS MS: no deformity or atrophy  Skin: warm and dry, ***device site well healed Neuro:  Strength and sensation are intact Psych: euthymic mood, full affect  EKG:  EKG {ACTION; IS/IS ZOX:[id number]} ordered today. Personal review of the ekg ordered *** shows ***  ***Personal review of the device interrogation today. Results in [place]: 11/09/2017: NT-Pro BNP 3,222 01/11/2018: BUN 21; Creatinine, Ser 1.63; Hemoglobin 12.8; Platelets 136; Potassium 4.5; Sodium 144    Lipid Panel  No results found for: CHOL, TRIG, HDL, CHOLHDL, VLDL, LDLCALC, LDLDIRECT   Wt Readings from Last 3 Encounters:  01/27/18 178 lb 3.2 oz (80.8 kg)  01/11/18 175 lb (79.4 kg)  12/24/17 181 lb (82.1 kg)      Other studies Reviewed: Additional studies/ records that were reviewed today include: CMRI  Review of the above records today demonstrates:  1. Severely dilated left ventricle with normal wall thickness and severely reduced systolic function (LVEF = 34%). There is diffuse hypokinesis.  There is no late gadolinium enhancement in the left ventricular myocardium.  2. Mildly dilated right ventricular size with normal wall thickness and moderately decreased systolic function (LVEF = 32%). There are no regional wall motion abnormalities.  3.  Severe bi-atrial dilatation.  No left atrial  thrombus.  4. Normal size of the aortic root, ascending aorta. Dilated pulmonary artery measuring 34 mm.  5. Moderate mitral regurgitation with posteriorly directed jet. Mild to moderate tricuspid regurgitation.  6.  Normal pericardium.  Trivial pericardial effusion.  ASSESSMENT AND PLAN:  1.  Nonischemic cardiomyopathy: ***Currently on losartan and bisoprolol.  His ejection fraction remains low.  Plan for ICD implant.  Risks and benefits were discussed include bleeding, tamponade, infection, pneumothorax.  He understands these risks and is agreed to the procedure.  2. PVCs: ***1% on his monitor.  Currently asymptomatic.  3. Hypertension: ***  4.  Atrial flutter: ***Appears typical in nature.  He is currently on Eliquis.  We [place] plan for ablation.  Risks and benefits were discussed include bleeding, tamponade, heart block, stroke.  The patient understands the risks and is agreed to the procedure.  This patients CHA2DS2-VASc Score and unadjusted Ischemic Stroke Rate (% per year) is equal to 4.8 % stroke rate/year from a score  of 4  Above score calculated as 1 point each if present [CHF, HTN, DM, Vascular=MI/PAD/Aortic Plaque, Age if 65-74, or Male] Above score calculated as 2 points each if present [Age > 75, or Stroke/TIA/TE]   Current medicines are reviewed at length with the patient today.   The patient does not have concerns regarding his medicines.  The following changes were made today:  ***  Labs/ tests ordered today include:  No orders of the defined types were placed in this encounter.   Disposition:   FU with [place] P[place] *** months  Signed, Pernella Ackerley Meredith Leeds, MD  05/02/2018 12:11 PM     [facility] [address] [place] [address] [phone] (office) [phone] (fax)

## 2018-05-03 ENCOUNTER — Other Ambulatory Visit: Payer: Self-pay

## 2018-05-03 MED ORDER — FUROSEMIDE 40 MG PO TABS: ORAL_TABLET | ORAL | 8 refills | Status: DC

## 2018-05-04 DIAGNOSIS — I483 Typical atrial flutter: Secondary | ICD-10-CM | POA: Diagnosis not present

## 2018-05-04 DIAGNOSIS — E1169 Type 2 diabetes mellitus with other specified complication: Secondary | ICD-10-CM | POA: Diagnosis not present

## 2018-05-04 DIAGNOSIS — I10 Essential (primary) hypertension: Secondary | ICD-10-CM | POA: Diagnosis not present

## 2018-05-04 DIAGNOSIS — Z79899 Other long term (current) drug therapy: Secondary | ICD-10-CM | POA: Diagnosis not present

## 2018-05-04 DIAGNOSIS — D696 Thrombocytopenia, unspecified: Secondary | ICD-10-CM | POA: Diagnosis not present

## 2018-05-04 DIAGNOSIS — I5020 Unspecified systolic (congestive) heart failure: Secondary | ICD-10-CM | POA: Diagnosis not present

## 2018-05-31 ENCOUNTER — Encounter: Payer: Medicare Other | Admitting: Cardiology

## 2018-06-02 DIAGNOSIS — N401 Enlarged prostate with lower urinary tract symptoms: Secondary | ICD-10-CM | POA: Diagnosis not present

## 2018-06-02 DIAGNOSIS — R351 Nocturia: Secondary | ICD-10-CM | POA: Diagnosis not present

## 2018-06-02 DIAGNOSIS — R3121 Asymptomatic microscopic hematuria: Secondary | ICD-10-CM | POA: Diagnosis not present

## 2018-06-07 ENCOUNTER — Ambulatory Visit (INDEPENDENT_AMBULATORY_CARE_PROVIDER_SITE_OTHER): Payer: Medicare Other | Admitting: Nurse Practitioner

## 2018-06-07 ENCOUNTER — Ambulatory Visit (INDEPENDENT_AMBULATORY_CARE_PROVIDER_SITE_OTHER): Payer: Medicare Other | Admitting: Pharmacist

## 2018-06-07 ENCOUNTER — Encounter: Payer: Self-pay | Admitting: Nurse Practitioner

## 2018-06-07 ENCOUNTER — Telehealth: Payer: Self-pay

## 2018-06-07 VITALS — BP 120/80 | HR 98 | Ht 71.0 in | Wt 177.6 lb

## 2018-06-07 DIAGNOSIS — I483 Typical atrial flutter: Secondary | ICD-10-CM | POA: Diagnosis not present

## 2018-06-07 DIAGNOSIS — Z7901 Long term (current) use of anticoagulants: Secondary | ICD-10-CM | POA: Diagnosis not present

## 2018-06-07 DIAGNOSIS — I428 Other cardiomyopathies: Secondary | ICD-10-CM

## 2018-06-07 LAB — POCT INR: INR: 2.7 (ref 2.0–3.0)

## 2018-06-07 NOTE — Patient Instructions (Addendum)
Description   Continue taking 1 tablet daily.  Recheck INR in 1 week. Coumadin Clinic [phone]

## 2018-06-07 NOTE — Progress Notes (Signed)
CARDIOLOGY OFFICE NOTE  Date:  06/07/2018    Carlos Gibson Date of Birth: 05-07-1940 Medical Record #[id number]  PCP:  [name], MD  Cardiologist:  Charlyne Mom    Chief Complaint  Patient presents with  . Shortness of Breath  . Cardiomyopathy    Follow up visit - seen for Dr. Charlyne Mom    History of Present Illness: Carlos Gibson is a 78 y.o. male who presents today for a follow up visit. Seen for Dr. Marlou Porch and Curt Bears.   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  He was put on Entresto. Has had prior ACE cough. His ejection fraction has since improved to 40-45%.   Last seen by Dr. Marlou Porch in September and was felt to be doing ok. Saw Dr. Curt Bears in October.   Comes in today. Here alone. Continues to complain of shortness of breath. Notes some fast heart beating at times. Saw Dr. Felipa Eth last week - has been out of his Lisinopril for the past 3 days - it is not listed on his med sheet. He has Losartan listed. He says he is taking Losartan. I do not see Delene Loll - he has been off and on Entresto in the past. He has to stop to rest after walking about 25 feet. His oxygen sats do not drop below 92 with activity. Some burning pain around his belly button.  Little dizzy and lightheaded at times. No actual chest pain. 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 He has been compliant with his coumadin. Does not appear to me that the timing of ablation was addressed.    Past Medical History:  Diagnosis Date  . Aortic regurgitation    a. mild AI by echo 01/2016.  [name] Aortic root dilation (HCC)   . Aortic valve regurgitation   . Bifascicular block   . Chronic systolic CHF (congestive heart failure) ([place])   . CKD (chronic kidney disease), stage III ([place]) 01/01/2016  . Diabetes ([place])   . Ejection fraction < 50%   . Frequent PVCs   . Gilbert's syndrome   . Hyperlipidemia   . Mild diastolic dysfunction   . Mitral regurgitation    a. mod by echo 10/207.  [name] NICM (nonischemic cardiomyopathy) ([place])   . Pericardial effusion    a. small by echo 01/2016.  . Pulmonary hypertension ([place])   . Right BBB/left post fasc block   . Thrombocytopenia ([place])     Past Surgical History:  Procedure Laterality Date  . BIV ICD INSERTION CRT-D N/A 01/26/2018   Procedure: BIV ICD INSERTION CRT-D;  Surgeon: Constance Haw, MD;  Location: [facility];  Service: Cardiovascular;  Laterality: N/A;  . CARDIAC CATHETERIZATION N/A 12/20/2014   Procedure: Right/Left Heart Cath and Coronary Angiography;  Surgeon: Jerline Pain, MD;  Location: [facility];  Service: Cardiovascular;  Laterality: N/A;  . HERNIA REPAIR  06/2017     Medications: Current Meds  Medication Sig  . alfuzosin (UROXATRAL) 10 MG 24 hr tablet  Take 10 mg by mouth at bedtime.   [name] atorvastatin (LIPITOR) 10 MG tablet Take 10 mg by mouth at bedtime.  . bisoprolol (ZEBETA) 5 MG tablet TAKE 1 TABLET BY MOUTH EVERY DAY  . Cholecalciferol (VITAMIN D3) 5000 units CAPS Take 5,000 Units by mouth every other day.  . finasteride (PROSCAR) 5 MG tablet Take 5 mg by mouth daily.   . furosemide (LASIX) 40 MG tablet Take 2 tablets in the morning  . glimepiride (AMARYL) 2 MG tablet Take 2 mg by mouth daily with breakfast.  . losartan (COZAAR) 25 MG tablet TAKE 1 TABLET(25 MG) BY MOUTH DAILY  . potassium chloride (K-DUR,KLOR-CON) 10 MEQ tablet Take 2 tablets (20 mEq total) by mouth daily.  [name] spironolactone  (ALDACTONE) 25 MG tablet TAKE 1/2 TABLET(12.5 MG) BY MOUTH DAILY  . warfarin (COUMADIN) 5 MG tablet TAKE 1 TABLET BY MOUTH DAILY     Allergies: Allergies  Allergen Reactions  . Ace Inhibitors Cough  . Coreg [Carvedilol] Other (See Comments)    "feels bad"  . Toprol Xl [Metoprolol Tartrate] Other (See Comments)    "feels bad"    Social History: The patient  reports that he has never smoked. He has never used smokeless tobacco. He reports that he does not drink alcohol or use drugs.   Family History: The patient's family history includes Hypertension in his mother; Irregular heart beat in his father; Stroke in his mother; Unexplained death in his father.   Review of Systems: Please see the history of present illness.   Otherwise, the review of systems is positive for none.   All other systems are reviewed and negative.   Physical Exam: VS:  BP 120/80 (BP Location: Left Arm, Patient Position: Sitting, Cuff Size: Normal)   Pulse 98   Ht 5\' 11"  (1.803 m)   Wt 177 lb 9.6 oz (80.6 kg)   SpO2 92% Comment: walking/100 at rest for 1 minute/94 at rest  BMI 24.77 kg/m  .  BMI Body mass index is 24.77 kg/m.  Wt Readings from Last 3 Encounters:  06/07/18 177 lb 9.6 oz (80.6 kg)  01/27/18 178 lb 3.2 oz (80.8 kg)  01/11/18 175 lb (79.4 kg)    General: Pleasant. Alert and in no acute distress. Dresses sharply.   HEENT: Normal.  Neck: Supple, no JVD, carotid bruits, or masses noted.  Cardiac: Little irregular and fast to me today. No murmurs, rubs, or gallops. No edema.  Respiratory:  Lungs are clear to auscultation bilaterally with normal work of breathing.  GI: Soft and nontender.  MS: No deformity or atrophy. Gait and ROM intact.  Skin: Warm and dry. Color is normal.  Neuro:  Strength and sensation are intact and no gross focal deficits noted.  Psych: Alert, appropriate and with normal affect.   LABORATORY DATA:  EKG:  EKG is not ordered today.  Lab Results  Component Value  Date   WBC 5.3 01/11/2018   HGB 12.8 (L) 01/11/2018   HCT 37.9 01/11/2018   PLT 136 (L) 01/11/2018   GLUCOSE 112 (H) 01/11/2018   ALT 53 12/31/2015   AST 34 12/31/2015   NA 144 01/11/2018   K 4.5 01/11/2018   CL 103 01/11/2018   CREATININE 1.63 (H) 01/11/2018   BUN 21 01/11/2018   CO2 25 01/11/2018   TSH 2.64 12/24/2014   INR 2.7 06/07/2018     Lab Results  Component Value Date   INR 2.7 06/07/2018   INR 2.7 04/14/2018  INR 2.1 03/24/2018    BNP (last 3 results) No results for input(s): BNP in the last 8760 hours.  ProBNP (last 3 results) Recent Labs    11/01/17 1533 11/09/17 0922  PROBNP 10,037* 3,222*     Other Studies Reviewed Today:  Echo Study Conclusions 04/2017  - Left ventricle: The cavity size was severely dilated. There was   mild concentric hypertrophy. Systolic function was severely   reduced. The estimated ejection fraction was in the range of 25%   to 30%. Severe diffuse hypokinesis with regional variations. - Aortic valve: There was moderate regurgitation directed   eccentrically in the LVOT and towards the mitral anterior   leaflet. Regurgitation pressure half-time: 300 ms. - Aorta: Aortic root dimension: 40 mm (ED). - Aortic root: The aortic root was mildly dilated. - Mitral valve: There was moderate regurgitation directed   eccentrically and posteriorly. - Left atrium: The atrium was severely dilated. - Right ventricle: The cavity size was moderately dilated. Wall   thickness was normal. Systolic function was moderately to   severely reduced. - Tricuspid valve: There was mild regurgitation. - Pulmonic valve: There was moderate regurgitation. - Pulmonary arteries: PA peak pressure: 38 mm Hg (S). - Pericardium, extracardiac: A trivial, free-flowing pericardial   effusion was identified posterior to the heart and along the   right atrial free wall.  Impressions:  - The right ventricular systolic pressure was increased consistent    with mild pulmonary hypertension.  Right/Left Heart Cath and Coronary Angiography 2016  Conclusion    There is severe left ventricular systolic dysfunction.  Ejection fraction between 25-30% with global hypokinesis  Moderately elevated pulmonary pressures-mean PA pressure 35 mmHg consistent with secondary pulmonary hypertension as a result of increased left ventricular end-diastolic pressure  No angiographically significant coronary artery disease present.   Nonischemic cardiomyopathy. Left ventricular ejection fraction 25-30%. Left ventricular end-diastolic pressure 24 mmHg. Cardiac output 3.7 L/m with cardiac index of 1.8.  We'll continue with aggressive medical management. Up titration of medications.      Assessment/Plan:  1.  Nonischemic cardiomyopathy: Currently on losartan and bisoprolol. Has been off and on Entresto. He tells me later that he is not taking Lisinopril.  He says he has been taking Losartan as recommendfed. He has his BiV ICD in place - may need to entertain the ablation again for his AFL. Recent labs noted off [facility].   2. Underlying BiV ICD - per EP  3. HTN - BP is fine here today.   4. PVCs: Not discussed.   5. Typical atrial flutter - did not have ablation back in October - looks like he was not taking his Eliquis appropriately - could not afford - he has been on Coumadin since - discussed with Dr. Curt Bears - will check weekly INR's until he sees him later this month.  6. Chronic anticoagulation - no problems noted.   7. Secondary pulmonary HTN  8. Mild to Moderate aortic regurgitation - would follow.   9. Dilated aortic root - not discussed today  10. CKD   Current medicines are reviewed with the patient today.  The patient does not have concerns regarding medicines other than what has been noted above.  The following changes have been made:  See above.  Labs/ tests ordered today include:   No orders of the defined types were placed in this  encounter.    Disposition:   FU with Dr. Curt Bears as planned later this month; Dr. Marlou Porch in 6 months.  Patient is agreeable to this plan and will call if any problems develop in the interim.   SignedTruitt Merle, NP  06/07/2018 3:20 PM  [facility] [address] [place], [place]  [address] Phone: [phone] Fax: [phone]

## 2018-06-07 NOTE — Patient Instructions (Addendum)
We will be checking the following labs today - NONE   Medication Instructions:    Continue with your current medicines.    If you need a refill on your cardiac medications before your next appointment, please call your pharmacy.     Testing/Procedures To Be Arranged:  Weekly INR til seen by EP  Follow-Up:   See Dr. Curt Bears as planned later this month    At [facility], you and your health needs are our priority.  As part of our continuing mission to provide you with exceptional heart care, we have created designated Provider Care Teams.  These Care Teams include your primary Cardiologist (physician) and Advanced Practice Providers (APPs -  Physician Assistants and Nurse Practitioners) who all work together to provide you with the care you need, when you need it.  Special Instructions:  . None  Call the [place] office at [phone] if you have any questions, problems or concerns.

## 2018-06-07 NOTE — Telephone Encounter (Signed)
Received notification from [employer] that he needed a PA for Entresto. After reviewing the chart, he never started this medication due to the cost and was placed back on Losartan. PA was not done at this time.

## 2018-06-10 ENCOUNTER — Telehealth: Payer: Self-pay | Admitting: Cardiology

## 2018-06-10 NOTE — Telephone Encounter (Signed)
Call came in today from [place] in regards to needing PA for [facility]. I s/w rep at [employer] and explained to them the pt never started [facility] due to cost and was placed back on Losartan. Rep said she will then close out request for the PA for Entresto as pt has resumed his previous medication.

## 2018-06-10 NOTE — Telephone Encounter (Signed)
New message   Pt c/o medication issue:  1. Name of Medication: Entresto    2. How are you currently taking this medication (dosage and times per day)? n/a  3. Are you having a reaction (difficulty breathing--STAT)? N/a   4. What is your medication issue? Per the previous message [employer] is calling to get prior authorization for this medication.

## 2018-06-14 ENCOUNTER — Ambulatory Visit (INDEPENDENT_AMBULATORY_CARE_PROVIDER_SITE_OTHER): Payer: Medicare Other

## 2018-06-14 DIAGNOSIS — I483 Typical atrial flutter: Secondary | ICD-10-CM | POA: Diagnosis not present

## 2018-06-14 DIAGNOSIS — Z7901 Long term (current) use of anticoagulants: Secondary | ICD-10-CM

## 2018-06-14 LAB — POCT INR: INR: 2.7 (ref 2.0–3.0)

## 2018-06-14 NOTE — Patient Instructions (Addendum)
Description   Continue on same dosage 1 tablet daily.  Recheck INR in 1 week pending ablation. Coumadin Clinic [phone]

## 2018-06-19 ENCOUNTER — Other Ambulatory Visit: Payer: Self-pay | Admitting: Cardiology

## 2018-06-27 ENCOUNTER — Telehealth: Payer: Self-pay

## 2018-06-27 NOTE — Telephone Encounter (Signed)

## 2018-06-28 ENCOUNTER — Ambulatory Visit (INDEPENDENT_AMBULATORY_CARE_PROVIDER_SITE_OTHER): Payer: Medicare Other | Admitting: *Deleted

## 2018-06-28 ENCOUNTER — Other Ambulatory Visit: Payer: Self-pay

## 2018-06-28 ENCOUNTER — Telehealth: Payer: Medicare Other | Admitting: Cardiology

## 2018-06-28 DIAGNOSIS — Z7901 Long term (current) use of anticoagulants: Secondary | ICD-10-CM | POA: Diagnosis not present

## 2018-06-28 DIAGNOSIS — I483 Typical atrial flutter: Secondary | ICD-10-CM

## 2018-06-28 LAB — POCT INR: INR: 2.6 (ref 2.0–3.0)

## 2018-06-28 NOTE — Patient Instructions (Addendum)
Description   Continue on same dosage 1 tablet daily.  Recheck INR in 3 weeks (pt to update [place] once ablation scheduled). Coumadin Clinic [phone]

## 2018-06-28 NOTE — Progress Notes (Signed)
Patient did not answer the phone. Visit canceled.

## 2018-06-30 ENCOUNTER — Telehealth: Payer: Medicare Other | Admitting: Cardiology

## 2018-07-01 ENCOUNTER — Other Ambulatory Visit: Payer: Self-pay

## 2018-07-01 ENCOUNTER — Encounter: Payer: Self-pay | Admitting: Cardiology

## 2018-07-01 ENCOUNTER — Telehealth (INDEPENDENT_AMBULATORY_CARE_PROVIDER_SITE_OTHER): Payer: Medicare Other | Admitting: Cardiology

## 2018-07-01 DIAGNOSIS — I5023 Acute on chronic systolic (congestive) heart failure: Secondary | ICD-10-CM | POA: Diagnosis not present

## 2018-07-01 NOTE — Patient Instructions (Signed)
Medication Instructions:  Your physician recommends that you continue on your current medications as directed. Please refer to the Current Medication list given to you today.  * If you need a refill on your cardiac medications before your next appointment, please call your pharmacy.   Labwork: None ordered  Testing/Procedures: None ordered  Follow-Up: Please send in a manual transmission.  Your physician recommends that you schedule a follow-up appointment in: 3 months with Dr. Curt Bears.  The office will contact you to arrange this appointment at a later date once pandemic is over.  Thank you for choosing [facility]!!   Trinidad Curet, RN [phone]

## 2018-07-01 NOTE — Progress Notes (Signed)
Electrophysiology TeleHealth Note   Due to national recommendations of social distancing due to COVID 19, an audio/video telehealth visit is felt to be most appropriate for this patient at this time.  The patient verbally consented today for telehealth for [facility].   Date:  07/01/2018   ID:  Carlos Gibson, DOB November 03, 1940, MRN [id number]  Location: patient's home  Provider location: [address], [place]  Evaluation Performed: Follow-up visit  PCP:  [name], MD  Cardiologist:  [name], MD  Electrophysiologist:  Jermia Rigsby Meredith Leeds, MD   Chief Complaint:  CHF  History of Present Illness:    Carlos Gibson is a 78 y.o. male who presents via audio/video conferencing for a telehealth visit today.  Since last being seen in our clinic, the patient reports doing very well.  Today, he denies symptoms of palpitations, chest pain, shortness of breath,  lower extremity edema, dizziness, presyncope, or syncope.  The patient is otherwise without complaint today.  The patient denies symptoms of fevers, chills, cough, or new SOB worrisome for COVID 19.   History of CHF, typical atrial flutter, HTN hypertension.  Foot toe by Doppler he was admitted to the hospital for an ICD implant and the typical flutter ablation, though he did have the ablation performed.  Overall he is feeling well.  He is able to do all of his daily activities.  He has no restrictions other than some mild fatigue when he climbs stairs.  He is working in his garden without restriction.  He is unaware of atrial flutter.  Past Medical History:  Diagnosis Date  . Aortic regurgitation    a. mild AI by echo 01/2016.  [name] Aortic root dilation (HCC)   . Aortic valve regurgitation   . Bifascicular block   . Chronic systolic CHF (congestive heart failure) ([place])   . CKD (chronic kidney disease), stage III ([place]) 01/01/2016  . Diabetes ([place])   . Ejection fraction < 50%   . Frequent PVCs   . Gilbert's  syndrome   . Hyperlipidemia   . Mild diastolic dysfunction   . Mitral regurgitation    a. mod by echo 10/207.  [name] NICM (nonischemic cardiomyopathy) ([place])   . Pericardial effusion    a. small by echo 01/2016.  . Pulmonary hypertension ([place])   . Right BBB/left post fasc block   . Thrombocytopenia ([place])     Past Surgical History:  Procedure Laterality Date  . BIV ICD INSERTION CRT-D N/A 01/26/2018   Procedure: BIV ICD INSERTION CRT-D;  Surgeon: Constance Haw, MD;  Location: [place] [facility];  Service: Cardiovascular;  Laterality: N/A;  . CARDIAC CATHETERIZATION N/A 12/20/2014   Procedure: Right/Left Heart Cath and Coronary Angiography;  Surgeon: Jerline Pain, MD;  Location: [place] [facility];  Service: Cardiovascular;  Laterality: N/A;  . HERNIA REPAIR  06/2017    Current Outpatient Medications  Medication Sig Dispense Refill  . alfuzosin (UROXATRAL) 10 MG 24 hr tablet Take 10 mg by mouth at bedtime.   3  . atorvastatin (LIPITOR) 10 MG tablet Take 10 mg by mouth at bedtime.    . bisoprolol (ZEBETA) 5 MG tablet TAKE 1 TABLET BY MOUTH EVERY DAY 90 tablet 3  . Cholecalciferol (VITAMIN D3) 5000 units CAPS Take 5,000 Units by mouth every other day.    . finasteride (PROSCAR) 5 MG tablet Take 5 mg by mouth daily.   2  . furosemide (LASIX) 40 MG tablet  Take 2 tablets in the morning 60 tablet 8  . glimepiride (AMARYL) 2 MG tablet Take 2 mg by mouth daily with breakfast.    . losartan (COZAAR) 25 MG tablet TAKE 1 TABLET(25 MG) BY MOUTH DAILY 90 tablet 2  . potassium chloride (K-DUR,KLOR-CON) 10 MEQ tablet Take 2 tablets (20 mEq total) by mouth daily. 60 tablet 11  . spironolactone (ALDACTONE) 25 MG tablet TAKE 1/2 TABLET(12.5 MG) BY MOUTH DAILY 45 tablet 2  . warfarin (COUMADIN) 5 MG tablet TAKE 1 TABLET BY MOUTH DAILY 30 tablet 1   No current facility-administered medications for this visit.     Allergies:   Ace inhibitors; Coreg [carvedilol]; and Toprol xl [metoprolol tartrate]    Social History:  The patient  reports that he has never smoked. He has never used smokeless tobacco. He reports that he does not drink alcohol or use drugs.   Family History:  The patient's  family history includes Hypertension in his mother; Irregular heart beat in his father; Stroke in his mother; Unexplained death in his father.   ROS:  Please see the history of present illness.   All other systems are personally reviewed and negative.    Exam:    Vital Signs:  BP 128/81   Pulse 66 .  Over the phone, not ill-sounding   Labs/Other Tests and Data Reviewed:    Recent Labs: 11/09/2017: NT-Pro BNP 3,222 01/11/2018: BUN 21; Creatinine, Ser 1.63; Hemoglobin 12.8; Platelets 136; Potassium 4.5; Sodium 144   Wt Readings from Last 3 Encounters:  06/07/18 177 lb 9.6 oz (80.6 kg)  01/27/18 178 lb 3.2 oz (80.8 kg)  01/11/18 175 lb (79.4 kg)     Other studies personally reviewed: Additional studies/ records that were reviewed today include: ECG 01/27/18 personally reviewed Review of the above records today demonstrates:  Atrial fluter, V pacing     ASSESSMENT & PLAN:    1.  Nonischemic cardiomyopathy: s/p medtronic CRT-D implant. On optimal medical therapy.  No obvious signs of volume overload.  He is able to do all of his daily activities without restriction.  I have asked him to send in a remote transmission for [place] to review.  2. Typical atrial flutter: rate controlled. Currently on coumadin.  I told him that it would be good to ablate his arrhythmia to hopefully get him off of Coumadin.  We [place] plan to see him back in 3 months for further discussions.  He is currently unaware of his arrhythmia.  This patients CHA2DS2-VASc Score and unadjusted Ischemic Stroke Rate (% per year) is equal to 4.8 % stroke rate/year from a score of 4  Above score calculated as 1 point each if present [CHF, HTN, DM, Vascular=MI/PAD/Aortic Plaque, Age if 65-74, or Male] Above score calculated as 2  points each if present [Age > 75, or Stroke/TIA/TE]    COVID 19 screen The patient denies symptoms of COVID 19 at this time.  The importance of social distancing was discussed today.  Follow-up:  3 months  Current medicines are reviewed at length with the patient today.   The patient does not have concerns regarding his medicines.  The following changes were made today:  none  Labs/ tests ordered today include:  No orders of the defined types were placed in this encounter.    Patient Risk:  after full review of this patients clinical status, I feel that they are at moderate risk at this time.  Today, I have spent 12 minutes with  the patient with telehealth technology discussing CHF atrial flutter .    Signed, Ahmyah Gidley Meredith Leeds, MD  07/01/2018 8:39 AM     [facility] [address] [place] [address] [phone] (office) [phone] (fax)

## 2018-07-04 ENCOUNTER — Other Ambulatory Visit: Payer: Self-pay

## 2018-07-04 ENCOUNTER — Ambulatory Visit (INDEPENDENT_AMBULATORY_CARE_PROVIDER_SITE_OTHER): Payer: Medicare Other | Admitting: *Deleted

## 2018-07-04 DIAGNOSIS — I428 Other cardiomyopathies: Secondary | ICD-10-CM

## 2018-07-04 DIAGNOSIS — I5022 Chronic systolic (congestive) heart failure: Secondary | ICD-10-CM

## 2018-07-05 LAB — CUP PACEART REMOTE DEVICE CHECK
Battery Remaining Longevity: 43 mo
Battery Voltage: 2.98 V
Brady Statistic AP VP Percent: 0.01 %
Brady Statistic AP VS Percent: 0 %
Brady Statistic AS VP Percent: 83.25 %
Brady Statistic AS VS Percent: 16.73 %
Brady Statistic RV Percent Paced: 84.89 %
Date Time Interrogation Session: 20200330141713
HighPow Impedance: 59 Ohm
Implantable Lead Implant Date: 20191023
Implantable Lead Implant Date: 20191023
Implantable Lead Implant Date: 20191023
Implantable Lead Location: 753859
Implantable Lead Location: 753860
Implantable Lead Location: 753862
Implantable Lead Model: 4598
Implantable Lead Model: 5076
Implantable Lead Model: 6935
Implantable Pulse Generator Implant Date: 20191023
Lead Channel Impedance Value: 174.595
Lead Channel Impedance Value: 189.073
Lead Channel Impedance Value: 200.978
Lead Channel Impedance Value: 221.667
Lead Channel Impedance Value: 304 Ohm
Lead Channel Impedance Value: 323 Ohm
Lead Channel Impedance Value: 380 Ohm
Lead Channel Impedance Value: 456 Ohm
Lead Channel Impedance Value: 456 Ohm
Lead Channel Impedance Value: 456 Ohm
Lead Channel Impedance Value: 532 Ohm
Lead Channel Impedance Value: 570 Ohm
Lead Channel Impedance Value: 646 Ohm
Lead Channel Impedance Value: 779 Ohm
Lead Channel Impedance Value: 817 Ohm
Lead Channel Impedance Value: 893 Ohm
Lead Channel Pacing Threshold Amplitude: 0.75 V
Lead Channel Pacing Threshold Amplitude: 4 V
Lead Channel Pacing Threshold Pulse Width: 0.4 ms
Lead Channel Pacing Threshold Pulse Width: 0.8 ms
Lead Channel Sensing Intrinsic Amplitude: 1.5 mV
Lead Channel Sensing Intrinsic Amplitude: 1.5 mV
Lead Channel Sensing Intrinsic Amplitude: 4.125 mV
Lead Channel Setting Pacing Amplitude: 2 V
Lead Channel Setting Pacing Amplitude: 3.5 V
Lead Channel Setting Pacing Amplitude: 5.5 V
Lead Channel Setting Pacing Pulse Width: 0.8 ms
MDC IDC MSMT LEADCHNL LV IMPEDANCE VALUE: 245.538
MDC IDC MSMT LEADCHNL LV IMPEDANCE VALUE: 532 Ohm
MDC IDC MSMT LEADCHNL RV SENSING INTR AMPL: 4.125 mV
MDC IDC SET LEADCHNL RV PACING PULSEWIDTH: 0.4 ms
MDC IDC SET LEADCHNL RV SENSING SENSITIVITY: 0.3 mV
MDC IDC STAT BRADY RA PERCENT PACED: 0.01 %

## 2018-07-11 NOTE — Progress Notes (Signed)
Remote ICD transmission.   

## 2018-07-18 ENCOUNTER — Telehealth: Payer: Self-pay

## 2018-07-18 NOTE — Telephone Encounter (Signed)

## 2018-07-19 ENCOUNTER — Ambulatory Visit (INDEPENDENT_AMBULATORY_CARE_PROVIDER_SITE_OTHER): Payer: Medicare Other | Admitting: Pharmacist

## 2018-07-19 ENCOUNTER — Other Ambulatory Visit: Payer: Self-pay

## 2018-07-19 DIAGNOSIS — I483 Typical atrial flutter: Secondary | ICD-10-CM

## 2018-07-19 DIAGNOSIS — Z7901 Long term (current) use of anticoagulants: Secondary | ICD-10-CM

## 2018-07-19 LAB — POCT INR: INR: 3.1 — AB (ref 2.0–3.0)

## 2018-08-05 ENCOUNTER — Telehealth: Payer: Self-pay | Admitting: Cardiology

## 2018-08-05 NOTE — Telephone Encounter (Signed)
Daughter in law is calling to notify us that the patients wife passed away last week

## 2018-08-08 NOTE — Telephone Encounter (Signed)
Thank you. My condolences. [name], MD

## 2018-08-18 ENCOUNTER — Other Ambulatory Visit: Payer: Self-pay | Admitting: Cardiology

## 2018-08-19 ENCOUNTER — Other Ambulatory Visit: Payer: Self-pay | Admitting: Cardiology

## 2018-08-22 ENCOUNTER — Telehealth: Payer: Self-pay

## 2018-08-22 NOTE — Telephone Encounter (Signed)
lmom for prescreen  

## 2018-08-23 ENCOUNTER — Ambulatory Visit (INDEPENDENT_AMBULATORY_CARE_PROVIDER_SITE_OTHER): Payer: Medicare Other | Admitting: *Deleted

## 2018-08-23 ENCOUNTER — Other Ambulatory Visit: Payer: Self-pay

## 2018-08-23 DIAGNOSIS — I483 Typical atrial flutter: Secondary | ICD-10-CM

## 2018-08-23 DIAGNOSIS — Z7901 Long term (current) use of anticoagulants: Secondary | ICD-10-CM

## 2018-08-23 LAB — POCT INR: INR: 2.6 (ref 2.0–3.0)

## 2018-08-23 NOTE — Patient Instructions (Signed)
Description   Spoke with pt and instructed pt to continue on same dosage 1 tablet daily.  Recheck INR in 6 weeks (pt to update [place] once ablation scheduled). [facility] [phone]

## 2018-09-21 ENCOUNTER — Other Ambulatory Visit: Payer: Self-pay | Admitting: Cardiology

## 2018-09-26 ENCOUNTER — Telehealth: Payer: Self-pay | Admitting: *Deleted

## 2018-09-26 NOTE — Telephone Encounter (Signed)
LV[facility] Calling patient to confirm in office appointment on 6/25.

## 2018-09-27 ENCOUNTER — Telehealth: Payer: Self-pay

## 2018-09-27 NOTE — Telephone Encounter (Signed)

## 2018-09-27 NOTE — Telephone Encounter (Signed)
   Primary Cardiologist: [name], MD   Pt contacted.  History and symptoms reviewed.  Pt will f/u with [facility] provider as scheduled.  Pt. advised that we are restricting visitors at this time and request that only patients present for check-in prior to their appointment.  All other visitors should remain in their car.  If necessary, only one visitor may come with the patient, into the building. For everyone's safety, all patients and visitor entering our practice area should expect to be screened again prior to entering our waiting area.  Marlis Edelson  09/27/2018 6:34 PM     COVID-19 Pre-Screening Questions:  . In the past 7 to 10 days have you had a cough,  shortness of breath, headache, congestion, fever (100 or greater) body aches, chills, sore throat, or sudden loss of taste or sense of smell?  NO . Have you been around anyone with known Covid 19?  NO   . Have you been around anyone who is awaiting Covid 19 test results in the past 7 to 10 days?  NO . Have you been around anyone who has been exposed to Covid 19, or has mentioned symptoms of Covid 19 within the past 7 to 10 days?  NO  If you have any concerns/questions about symptoms patients report during screening (either on the phone or at threshold). Contact the provider seeing the patient or DOD for further guidance.  If neither are available contact a member of the leadership team.

## 2018-09-28 ENCOUNTER — Other Ambulatory Visit: Payer: Self-pay | Admitting: Physician Assistant

## 2018-09-29 ENCOUNTER — Other Ambulatory Visit: Payer: Self-pay

## 2018-09-29 ENCOUNTER — Encounter: Payer: Self-pay | Admitting: Cardiology

## 2018-09-29 ENCOUNTER — Ambulatory Visit (INDEPENDENT_AMBULATORY_CARE_PROVIDER_SITE_OTHER): Payer: Medicare Other | Admitting: Cardiology

## 2018-09-29 VITALS — BP 126/62 | HR 73 | Ht 71.0 in | Wt 176.0 lb

## 2018-09-29 DIAGNOSIS — I483 Typical atrial flutter: Secondary | ICD-10-CM

## 2018-09-29 LAB — CUP PACEART INCLINIC DEVICE CHECK
Battery Remaining Longevity: 85 mo
Battery Voltage: 2.96 V
Brady Statistic AP VP Percent: 0.02 %
Brady Statistic AP VS Percent: 0.01 %
Brady Statistic AS VP Percent: 80.12 %
Brady Statistic AS VS Percent: 19.86 %
Brady Statistic RA Percent Paced: 0.02 %
Brady Statistic RV Percent Paced: 81.98 %
Date Time Interrogation Session: 20200625164631
HighPow Impedance: 62 Ohm
Implantable Lead Implant Date: 20191023
Implantable Lead Implant Date: 20191023
Implantable Lead Implant Date: 20191023
Implantable Lead Location: 753858
Implantable Lead Location: 753859
Implantable Lead Location: 753860
Implantable Lead Model: 4598
Implantable Lead Model: 5076
Implantable Lead Model: 6935
Implantable Pulse Generator Implant Date: 20191023
Lead Channel Impedance Value: 180 Ohm
Lead Channel Impedance Value: 194.634
Lead Channel Impedance Value: 195.429
Lead Channel Impedance Value: 207.273
Lead Channel Impedance Value: 212.8 Ohm
Lead Channel Impedance Value: 342 Ohm
Lead Channel Impedance Value: 342 Ohm
Lead Channel Impedance Value: 380 Ohm
Lead Channel Impedance Value: 399 Ohm
Lead Channel Impedance Value: 437 Ohm
Lead Channel Impedance Value: 456 Ohm
Lead Channel Impedance Value: 456 Ohm
Lead Channel Impedance Value: 532 Ohm
Lead Channel Impedance Value: 627 Ohm
Lead Channel Impedance Value: 665 Ohm
Lead Channel Impedance Value: 703 Ohm
Lead Channel Impedance Value: 722 Ohm
Lead Channel Impedance Value: 760 Ohm
Lead Channel Pacing Threshold Amplitude: 0.625 V
Lead Channel Pacing Threshold Amplitude: 1.75 V
Lead Channel Pacing Threshold Pulse Width: 0.4 ms
Lead Channel Pacing Threshold Pulse Width: 0.8 ms
Lead Channel Sensing Intrinsic Amplitude: 1 mV
Lead Channel Sensing Intrinsic Amplitude: 1.125 mV
Lead Channel Sensing Intrinsic Amplitude: 8.5 mV
Lead Channel Sensing Intrinsic Amplitude: 9.5 mV
Lead Channel Setting Pacing Amplitude: 2 V
Lead Channel Setting Pacing Amplitude: 2.5 V
Lead Channel Setting Pacing Amplitude: 3.5 V
Lead Channel Setting Pacing Pulse Width: 0.4 ms
Lead Channel Setting Pacing Pulse Width: 0.8 ms
Lead Channel Setting Sensing Sensitivity: 0.3 mV

## 2018-09-29 NOTE — Patient Instructions (Signed)
Medication Instructions:  Your physician recommends that you continue on your current medications as directed. Please refer to the Current Medication list given to you today.  * If you need a refill on your cardiac medications before your next appointment, please call your pharmacy.   Labwork: None ordered  Testing/Procedures: Your physician has recommended that you have a Cardioversion (DCCV). Electrical Cardioversion uses a jolt of electricity to your heart either through paddles or wired patches attached to your chest. This is a controlled, usually prescheduled, procedure. Defibrillation is done under light anesthesia in the hospital, and you usually go home the day of the procedure. This is done to get your heart back into a normal rhythm. You are not awake for the procedure. Nurse will call you to arrange this mid July.  Follow-Up: To be determined once cardioversion is scheduled.   Thank you for choosing [facility]!!   Trinidad Curet, RN [phone]  Any Other Special Instructions Will Be Listed Below (If Applicable).  Electrical Cardioversion  Electrical cardioversion is the delivery of a jolt of electricity to restore a normal rhythm to the heart. A rhythm that is too fast or is not regular keeps the heart from pumping well. In this procedure, sticky patches or metal paddles are placed on the chest to deliver electricity to the heart from a device. This procedure may be done in an emergency if:  There is low or no blood pressure as a result of the heart rhythm.  Normal rhythm must be restored as fast as possible to protect the brain and heart from further damage.  It may save a life. This procedure may also be done for irregular or fast heart rhythms that are not immediately life-threatening. Tell a health care provider about:  Any allergies you have.  All medicines you are taking, including vitamins, herbs, eye drops, creams, and over-the-counter medicines.  Any  problems you or family members have had with anesthetic medicines.  Any blood disorders you have.  Any surgeries you have had.  Any medical conditions you have.  Whether you are pregnant or may be pregnant. What are the risks? Generally, this is a safe procedure. However, problems may occur, including:  Allergic reactions to medicines.  A blood clot that breaks free and travels to other parts of your body.  The possible return of an abnormal heart rhythm within hours or days after the procedure.  Your heart stopping (cardiac arrest). This is rare. What happens before the procedure? Medicines  Your health care provider may have you start taking: ? Blood-thinning medicines (anticoagulants) so your blood does not clot as easily. ? Medicines may be given to help stabilize your heart rate and rhythm.  Ask your health care provider about changing or stopping your regular medicines. This is especially important if you are taking diabetes medicines or blood thinners. General instructions  Plan to have someone take you home from the hospital or clinic.  If you will be going home right after the procedure, plan to have someone with you for 24 hours.  Follow instructions from your health care provider about eating or drinking restrictions. What happens during the procedure?  To lower your risk of infection: ? Your health care team will wash or sanitize their hands. ? Your skin will be washed with soap.  An IV tube will be inserted into one of your veins.  You will be given a medicine to help you relax (sedative).  Sticky patches (electrodes) or metal paddles  may be placed on your chest.  An electrical shock will be delivered. The procedure may vary among health care providers and hospitals. What happens after the procedure?   Your blood pressure, heart rate, breathing rate, and blood oxygen level will be monitored until the medicines you were given have worn off.  Do not  drive for 24 hours if you were given a sedative.  Your heart rhythm will be watched to make sure it does not change. This information is not intended to replace advice given to you by your health care provider. Make sure you discuss any questions you have with your health care provider. Document Released: 03/13/2002 Document Revised: 11/20/2015 Document Reviewed: 09/27/2015 Elsevier Interactive Patient Education  2019 [employer].

## 2018-09-29 NOTE — Progress Notes (Signed)
Electrophysiology Office Note   Date:  09/29/2018   ID:  Carlos Gibson, DOB July 08, 1940, MRN [id number]  PCP:  [name], MD  Cardiologist:  Marlou Porch Primary Electrophysiologist:  Constance Haw, MD    No chief complaint on file.    History of Present Illness: Carlos Gibson is a 78 y.o. male who presents today for electrophysiology evaluation.   Hx NICM/chronic systolic CHF (LHC 11/8500 with normal cors &EF 25-30%; last echo 05/2015 with EF 35-40%), intolerance to prior Toprol/carvedilol due to "feeling bad," secondary pulmonary hypertension, frequent PVCs, aortic root dilatation, Trifasicular block (RBBB + LAFB + 1st degree AVB), diabetes, Gilbert's syndrome, mild AI/MR by echo 05/2015, hyperlipidemia, thrombocytopenia, probable CKD stage III.  He was put on Entresto.  His ejection fraction has since improved to 40-45%.  He is now status post Medtronic CRT-D implanted 01/26/2018.  His ejection fraction prior to was 25 to 30%.  Today, denies symptoms of palpitations, chest pain, shortness of breath, orthopnea, PND, lower extremity edema, claudication, dizziness, presyncope, syncope, bleeding, or neurologic sequela. The patient is tolerating medications without difficulties.  Overall he is doing well.  A few months ago he was quite short of breath but he has done well over the past few weeks.  He has been working outside in his garden without major issues.  Unfortunately he is in atrial fibrillation today.   Past Medical History:  Diagnosis Date  . Aortic regurgitation    a. mild AI by echo 01/2016.  [name] Aortic root dilation (HCC)   . Aortic valve regurgitation   . Bifascicular block   . Chronic systolic CHF (congestive heart failure) ([place])   . CKD (chronic kidney disease), stage III ([place]) 01/01/2016  . Diabetes ([place])   . Ejection fraction < 50%   . Frequent PVCs   . Gilbert's syndrome   . Hyperlipidemia   . Mild diastolic dysfunction   . Mitral regurgitation    a. mod by echo  10/207.  [name] NICM (nonischemic cardiomyopathy) ([place])   . Pericardial effusion    a. small by echo 01/2016.  . Pulmonary hypertension ([place])   . Right BBB/left post fasc block   . Thrombocytopenia ([place])    Past Surgical History:  Procedure Laterality Date  . BIV ICD INSERTION CRT-D N/A 01/26/2018   Procedure: BIV ICD INSERTION CRT-D;  Surgeon: Constance Haw, MD;  Location: [place] [facility];  Service: Cardiovascular;  Laterality: N/A;  . CARDIAC CATHETERIZATION N/A 12/20/2014   Procedure: Right/Left Heart Cath and Coronary Angiography;  Surgeon: Jerline Pain, MD;  Location: [facility];  Service: Cardiovascular;  Laterality: N/A;  . HERNIA REPAIR  06/2017     Current Outpatient Medications  Medication Sig Dispense Refill  . alfuzosin (UROXATRAL) 10 MG 24 hr tablet Take 10 mg by mouth at bedtime.   3  . atorvastatin (LIPITOR) 10 MG tablet Take 10 mg by mouth at bedtime.    . bisoprolol (ZEBETA) 5 MG tablet TAKE 1 TABLET BY MOUTH EVERY DAY 90 tablet 2  . Cholecalciferol (VITAMIN D3) 5000 units CAPS Take 5,000 Units by mouth every other day.    . finasteride (PROSCAR) 5 MG tablet Take 5 mg by mouth daily.   2  . furosemide (LASIX) 40 MG tablet Take 2 tablets in the morning 60 tablet 8  . glimepiride (AMARYL) 2 MG tablet Take 2 mg by mouth daily with breakfast.    . losartan (COZAAR) 25 MG tablet TAKE 1  TABLET(25 MG) BY MOUTH DAILY 90 tablet 2  . potassium chloride (K-DUR,KLOR-CON) 10 MEQ tablet Take 2 tablets (20 mEq total) by mouth daily. 60 tablet 11  . spironolactone (ALDACTONE) 25 MG tablet TAKE 1/2 TABLET(12.5 MG) BY MOUTH DAILY 45 tablet 2  . warfarin (COUMADIN) 5 MG tablet TAKE 1 TABLET BY MOUTH DAILY 90 tablet 0   No current facility-administered medications for this visit.     Allergies:   Ace inhibitors, Coreg [carvedilol], and Toprol xl [metoprolol tartrate]   Social History:  The patient  reports that he has never smoked. He has never used smokeless tobacco.  He reports that he does not drink alcohol or use drugs.   Family History:  The patient's family history includes Hypertension in his mother; Irregular heart beat in his father; Stroke in his mother; Unexplained death in his father.    ROS:  Please see the history of present illness.   Otherwise, review of systems is positive for none.   All other systems are reviewed and negative.   PHYSICAL EXAM: VS:  BP 126/62   Pulse 73   Ht 5\' 11"  (1.803 m)   Wt 176 lb (79.8 kg)   BMI 24.55 kg/m  , BMI Body mass index is 24.55 kg/m. GEN: Well nourished, well developed, in no acute distress  HEENT: normal  Neck: no JVD, carotid bruits, or masses Cardiac: RRR; no murmurs, rubs, or gallops,no edema  Respiratory:  clear to auscultation bilaterally, normal work of breathing GI: soft, nontender, nondistended, + BS MS: no deformity or atrophy  Skin: warm and dry, device site well healed Neuro:  Strength and sensation are intact Psych: euthymic mood, full affect  EKG:  EKG is ordered today. Personal review of the ekg ordered shows atrial fibrillation, ventricular paced  Personal review of the device interrogation today. Results in [place]: 11/09/2017: NT-Pro BNP 3,222 01/11/2018: BUN 21; Creatinine, Ser 1.63; Hemoglobin 12.8; Platelets 136; Potassium 4.5; Sodium 144    Lipid Panel  No results found for: CHOL, TRIG, HDL, CHOLHDL, VLDL, LDLCALC, LDLDIRECT   Wt Readings from Last 3 Encounters:  09/29/18 176 lb (79.8 kg)  06/07/18 177 lb 9.6 oz (80.6 kg)  01/27/18 178 lb 3.2 oz (80.8 kg)      Other studies Reviewed: Additional studies/ records that were reviewed today include: CMRI  Review of the above records today demonstrates:  1. Severely dilated left ventricle with normal wall thickness and severely reduced systolic function (LVEF = 34%). There is diffuse hypokinesis.  There is no late gadolinium enhancement in the left ventricular myocardium.  2. Mildly dilated right  ventricular size with normal wall thickness and moderately decreased systolic function (LVEF = 32%). There are no regional wall motion abnormalities.  3.  Severe bi-atrial dilatation.  No left atrial thrombus.  4. Normal size of the aortic root, ascending aorta. Dilated pulmonary artery measuring 34 mm.  5. Moderate mitral regurgitation with posteriorly directed jet. Mild to moderate tricuspid regurgitation.  6.  Normal pericardium.  Trivial pericardial effusion.  ASSESSMENT AND PLAN:  1.  Nonischemic cardiomyopathy: Currently on losartan and bisoprolol.  Status post Medtronic CRT-D implant.  Device functioning appropriately.  He is unfortunately in atrial fibrillation and thus we cannot check his atrial lead.  We  do a full interrogation after his cardioversion.  2. PVCs: Minimal burden.  No changes.  3. Hypertension: Currently well controlled  4.  Persistent atrial fibrillation/atrial flutter: Unfortunately he has any atrial fibrillation today.  At this point I would like to see how he is feeling in sinus rhythm.  We  plan for cardioversion.  If he feels better in sinus rhythm,  likely plan for further rhythm control strategies.  This patients CHA2DS2-VASc Score and unadjusted Ischemic Stroke Rate (% per year) is equal to 4.8 % stroke rate/year from a score of 4  Above score calculated as 1 point each if present [CHF, HTN, DM, Vascular=MI/PAD/Aortic Plaque, Age if 65-74, or Male] Above score calculated as 2 points each if present [Age > 75, or Stroke/TIA/TE]   Current medicines are reviewed at length with the patient today.   The patient does not have concerns regarding his medicines.  The following changes were made today:  none  Labs/ tests ordered today include:  Orders Placed This Encounter  Procedures  . EKG 12-Lead    Disposition:   FU with   1 months  Signed,  Meredith Leeds, MD  09/29/2018 3:29 PM     [facility] [address] [place] [address] [phone] (office) [phone] (fax)

## 2018-09-29 NOTE — H&P (View-Only) (Signed)
Electrophysiology Office Note   Date:  09/29/2018   ID:  Carlos Gibson, DOB 22-Jun-1940, MRN [id number]  PCP:  [name], MD  Cardiologist:  Marlou Porch Primary Electrophysiologist:  Constance Haw, MD    No chief complaint on file.    History of Present Illness: Carlos Gibson is a 78 y.o. male who presents today for electrophysiology evaluation.   Hx NICM/chronic systolic CHF (LHC 08/6810 with normal cors &EF 25-30%; last echo 05/2015 with EF 35-40%), intolerance to prior Toprol/carvedilol due to "feeling bad," secondary pulmonary hypertension, frequent PVCs, aortic root dilatation, Trifasicular block (RBBB + LAFB + 1st degree AVB), diabetes, Gilbert's syndrome, mild AI/MR by echo 05/2015, hyperlipidemia, thrombocytopenia, probable CKD stage III.  He was put on Entresto.  His ejection fraction has since improved to 40-45%.  He is now status post Medtronic CRT-D implanted 01/26/2018.  His ejection fraction prior to was 25 to 30%.  Today, denies symptoms of palpitations, chest pain, shortness of breath, orthopnea, PND, lower extremity edema, claudication, dizziness, presyncope, syncope, bleeding, or neurologic sequela. The patient is tolerating medications without difficulties.  Overall he is doing well.  A few months ago he was quite short of breath but he has done well over the past few weeks.  He has been working outside in his garden without major issues.  Unfortunately he is in atrial fibrillation today.   Past Medical History:  Diagnosis Date  . Aortic regurgitation    a. mild AI by echo 01/2016.  [name] Aortic root dilation (HCC)   . Aortic valve regurgitation   . Bifascicular block   . Chronic systolic CHF (congestive heart failure) ([place])   . CKD (chronic kidney disease), stage III ([place]) 01/01/2016  . Diabetes ([place])   . Ejection fraction < 50%   . Frequent PVCs   . Gilbert's syndrome   . Hyperlipidemia   . Mild diastolic dysfunction   . Mitral regurgitation    a. mod by echo  10/207.  [name] NICM (nonischemic cardiomyopathy) ([place])   . Pericardial effusion    a. small by echo 01/2016.  . Pulmonary hypertension ([place])   . Right BBB/left post fasc block   . Thrombocytopenia ([place])    Past Surgical History:  Procedure Laterality Date  . BIV ICD INSERTION CRT-D N/A 01/26/2018   Procedure: BIV ICD INSERTION CRT-D;  Surgeon: Constance Haw, MD;  Location: [facility];  Service: Cardiovascular;  Laterality: N/A;  . CARDIAC CATHETERIZATION N/A 12/20/2014   Procedure: Right/Left Heart Cath and Coronary Angiography;  Surgeon: Jerline Pain, MD;  Location: [facility];  Service: Cardiovascular;  Laterality: N/A;  . HERNIA REPAIR  06/2017     Current Outpatient Medications  Medication Sig Dispense Refill  . alfuzosin (UROXATRAL) 10 MG 24 hr tablet Take 10 mg by mouth at bedtime.   3  . atorvastatin (LIPITOR) 10 MG tablet Take 10 mg by mouth at bedtime.    . bisoprolol (ZEBETA) 5 MG tablet TAKE 1 TABLET BY MOUTH EVERY DAY 90 tablet 2  . Cholecalciferol (VITAMIN D3) 5000 units CAPS Take 5,000 Units by mouth every other day.    . finasteride (PROSCAR) 5 MG tablet Take 5 mg by mouth daily.   2  . furosemide (LASIX) 40 MG tablet Take 2 tablets in the morning 60 tablet 8  . glimepiride (AMARYL) 2 MG tablet Take 2 mg by mouth daily with breakfast.    . losartan (COZAAR) 25 MG tablet TAKE 1  TABLET(25 MG) BY MOUTH DAILY 90 tablet 2  . potassium chloride (K-DUR,KLOR-CON) 10 MEQ tablet Take 2 tablets (20 mEq total) by mouth daily. 60 tablet 11  . spironolactone (ALDACTONE) 25 MG tablet TAKE 1/2 TABLET(12.5 MG) BY MOUTH DAILY 45 tablet 2  . warfarin (COUMADIN) 5 MG tablet TAKE 1 TABLET BY MOUTH DAILY 90 tablet 0   No current facility-administered medications for this visit.     Allergies:   Ace inhibitors, Coreg [carvedilol], and Toprol xl [metoprolol tartrate]   Social History:  The patient  reports that he has never smoked. He has never used smokeless tobacco.  He reports that he does not drink alcohol or use drugs.   Family History:  The patient's family history includes Hypertension in his mother; Irregular heart beat in his father; Stroke in his mother; Unexplained death in his father.    ROS:  Please see the history of present illness.   Otherwise, review of systems is positive for none.   All other systems are reviewed and negative.   PHYSICAL EXAM: VS:  BP 126/62   Pulse 73   Ht 5\' 11"  (1.803 m)   Wt 176 lb (79.8 kg)   BMI 24.55 kg/m  , BMI Body mass index is 24.55 kg/m. GEN: Well nourished, well developed, in no acute distress  HEENT: normal  Neck: no JVD, carotid bruits, or masses Cardiac: RRR; no murmurs, rubs, or gallops,no edema  Respiratory:  clear to auscultation bilaterally, normal work of breathing GI: soft, nontender, nondistended, + BS MS: no deformity or atrophy  Skin: warm and dry, device site well healed Neuro:  Strength and sensation are intact Psych: euthymic mood, full affect  EKG:  EKG is ordered today. Personal review of the ekg ordered shows atrial fibrillation, ventricular paced  Personal review of the device interrogation today. Results in [facility]: 11/09/2017: NT-Pro BNP 3,222 01/11/2018: BUN 21; Creatinine, Ser 1.63; Hemoglobin 12.8; Platelets 136; Potassium 4.5; Sodium 144    Lipid Panel  No results found for: CHOL, TRIG, HDL, CHOLHDL, VLDL, LDLCALC, LDLDIRECT   Wt Readings from Last 3 Encounters:  09/29/18 176 lb (79.8 kg)  06/07/18 177 lb 9.6 oz (80.6 kg)  01/27/18 178 lb 3.2 oz (80.8 kg)      Other studies Reviewed: Additional studies/ records that were reviewed today include: CMRI  Review of the above records today demonstrates:  1. Severely dilated left ventricle with normal wall thickness and severely reduced systolic function (LVEF = 34%). There is diffuse hypokinesis.  There is no late gadolinium enhancement in the left ventricular myocardium.  2. Mildly dilated right  ventricular size with normal wall thickness and moderately decreased systolic function (LVEF = 32%). There are no regional wall motion abnormalities.  3.  Severe bi-atrial dilatation.  No left atrial thrombus.  4. Normal size of the aortic root, ascending aorta. Dilated pulmonary artery measuring 34 mm.  5. Moderate mitral regurgitation with posteriorly directed jet. Mild to moderate tricuspid regurgitation.  6.  Normal pericardium.  Trivial pericardial effusion.  ASSESSMENT AND PLAN:  1.  Nonischemic cardiomyopathy: Currently on losartan and bisoprolol.  Status post Medtronic CRT-D implant.  Device functioning appropriately.  He is unfortunately in atrial fibrillation and thus we cannot check his atrial lead.  We Acacia Latorre do a full interrogation after his cardioversion.  2. PVCs: Minimal burden.  No changes.  3. Hypertension: Currently well controlled  4.  Persistent atrial fibrillation/atrial flutter: Unfortunately he has any atrial fibrillation today.  At this point I would like to see how he is feeling in sinus rhythm.  We Jeyden Coffelt plan for cardioversion.  If he feels better in sinus rhythm, Perrion Diesel likely plan for further rhythm control strategies.  This patients CHA2DS2-VASc Score and unadjusted Ischemic Stroke Rate (% per year) is equal to 4.8 % stroke rate/year from a score of 4  Above score calculated as 1 point each if present [CHF, HTN, DM, Vascular=MI/PAD/Aortic Plaque, Age if 65-74, or Male] Above score calculated as 2 points each if present [Age > 75, or Stroke/TIA/TE]   Current medicines are reviewed at length with the patient today.   The patient does not have concerns regarding his medicines.  The following changes were made today:  none  Labs/ tests ordered today include:  Orders Placed This Encounter  Procedures  . EKG 12-Lead    Disposition:   FU with Maryn Freelove 1 months  Signed, Sender Rueb Meredith Leeds, MD  09/29/2018 3:29 PM     [facility] [address] [place] [address] [phone] (office) [phone] (fax)

## 2018-10-03 ENCOUNTER — Ambulatory Visit (INDEPENDENT_AMBULATORY_CARE_PROVIDER_SITE_OTHER): Payer: Medicare Other | Admitting: *Deleted

## 2018-10-03 DIAGNOSIS — I447 Left bundle-branch block, unspecified: Secondary | ICD-10-CM

## 2018-10-03 DIAGNOSIS — I428 Other cardiomyopathies: Secondary | ICD-10-CM

## 2018-10-03 LAB — CUP PACEART REMOTE DEVICE CHECK
Battery Remaining Longevity: 71 mo
Battery Voltage: 2.96 V
Brady Statistic AP VP Percent: 0.02 %
Brady Statistic AP VS Percent: 0 %
Brady Statistic AS VP Percent: 81.1 %
Brady Statistic AS VS Percent: 18.89 %
Brady Statistic RA Percent Paced: 0.01 %
Brady Statistic RV Percent Paced: 82.8 %
Date Time Interrogation Session: 20200629052305
HighPow Impedance: 66 Ohm
Implantable Lead Implant Date: 20191023
Implantable Lead Implant Date: 20191023
Implantable Lead Implant Date: 20191023
Implantable Lead Location: 753858
Implantable Lead Location: 753859
Implantable Lead Location: 753860
Implantable Lead Model: 4598
Implantable Lead Model: 5076
Implantable Lead Model: 6935
Implantable Pulse Generator Implant Date: 20191023
Lead Channel Impedance Value: 166.114
Lead Channel Impedance Value: 178.5 Ohm
Lead Channel Impedance Value: 184.154
Lead Channel Impedance Value: 184.154
Lead Channel Impedance Value: 199.5 Ohm
Lead Channel Impedance Value: 323 Ohm
Lead Channel Impedance Value: 323 Ohm
Lead Channel Impedance Value: 342 Ohm
Lead Channel Impedance Value: 399 Ohm
Lead Channel Impedance Value: 399 Ohm
Lead Channel Impedance Value: 399 Ohm
Lead Channel Impedance Value: 437 Ohm
Lead Channel Impedance Value: 513 Ohm
Lead Channel Impedance Value: 570 Ohm
Lead Channel Impedance Value: 589 Ohm
Lead Channel Impedance Value: 627 Ohm
Lead Channel Impedance Value: 646 Ohm
Lead Channel Impedance Value: 665 Ohm
Lead Channel Pacing Threshold Amplitude: 0.875 V
Lead Channel Pacing Threshold Amplitude: 2.125 V
Lead Channel Pacing Threshold Pulse Width: 0.4 ms
Lead Channel Pacing Threshold Pulse Width: 0.8 ms
Lead Channel Sensing Intrinsic Amplitude: 2 mV
Lead Channel Sensing Intrinsic Amplitude: 2 mV
Lead Channel Sensing Intrinsic Amplitude: 6.75 mV
Lead Channel Sensing Intrinsic Amplitude: 6.75 mV
Lead Channel Setting Pacing Amplitude: 2 V
Lead Channel Setting Pacing Amplitude: 2.75 V
Lead Channel Setting Pacing Amplitude: 3.5 V
Lead Channel Setting Pacing Pulse Width: 0.4 ms
Lead Channel Setting Pacing Pulse Width: 0.8 ms
Lead Channel Setting Sensing Sensitivity: 0.3 mV

## 2018-10-04 ENCOUNTER — Telehealth: Payer: Self-pay

## 2018-10-04 DIAGNOSIS — Z01812 Encounter for preprocedural laboratory examination: Secondary | ICD-10-CM

## 2018-10-04 DIAGNOSIS — I483 Typical atrial flutter: Secondary | ICD-10-CM

## 2018-10-04 DIAGNOSIS — I480 Paroxysmal atrial fibrillation: Secondary | ICD-10-CM

## 2018-10-04 NOTE — Telephone Encounter (Signed)
-----   Message from Stanton Kidney, RN sent at 10/04/2018  8:50 AM EDT ----- Regarding: DCCV Seen 6/25 virtual visit w/ [facility]. Needs DCCV week of July 13th Coumadin, check scheduled for today

## 2018-10-04 NOTE — Telephone Encounter (Signed)
LVM for return call to arrange DCCV and lab work. Pt did not arrive for his INR check today, so this will also need to be rescheduled.

## 2018-10-05 NOTE — Telephone Encounter (Signed)
Coumadin appt rescheduled to tomorrow. Pt aware I will call him next week to arrange DCCV for week of 7/20. Will send message to coumadin clinic for weekly INR checks in preparation for DCCV.

## 2018-10-06 ENCOUNTER — Other Ambulatory Visit: Payer: Self-pay

## 2018-10-06 ENCOUNTER — Ambulatory Visit (INDEPENDENT_AMBULATORY_CARE_PROVIDER_SITE_OTHER): Payer: Medicare Other | Admitting: *Deleted

## 2018-10-06 DIAGNOSIS — I483 Typical atrial flutter: Secondary | ICD-10-CM

## 2018-10-06 DIAGNOSIS — Z7901 Long term (current) use of anticoagulants: Secondary | ICD-10-CM

## 2018-10-06 LAB — POCT INR: INR: 2.3 (ref 2.0–3.0)

## 2018-10-06 NOTE — Patient Instructions (Addendum)
Description   Continue taking 1 tablet daily.  Recheck INR in 1 week (pending dccv vs ablation). Coumadin Clinic [phone]

## 2018-10-10 ENCOUNTER — Telehealth: Payer: Self-pay

## 2018-10-10 NOTE — Telephone Encounter (Signed)
Scheduled DCCV for 7/20.  Instructions for procedure reviewed w/ pt. Aware to arrive to [facility] at 9:30 morning of DCCV. Scheduled for pre procedure blood work on 7/10, when pt is in office for Coumadin check. Scheduled for COVID screening 7/17. Instructions reviewed w/ pt. Aware office will call to arrange post procedure follow up. Reviewed all instructions several times with pt. Patient verbalized understanding and agreeable to plan.

## 2018-10-10 NOTE — Telephone Encounter (Signed)

## 2018-10-11 NOTE — Progress Notes (Signed)
Remote ICD transmission.   

## 2018-10-14 ENCOUNTER — Ambulatory Visit (INDEPENDENT_AMBULATORY_CARE_PROVIDER_SITE_OTHER): Payer: Medicare Other | Admitting: *Deleted

## 2018-10-14 ENCOUNTER — Other Ambulatory Visit: Payer: Medicare Other | Admitting: *Deleted

## 2018-10-14 ENCOUNTER — Other Ambulatory Visit: Payer: Self-pay

## 2018-10-14 DIAGNOSIS — Z7901 Long term (current) use of anticoagulants: Secondary | ICD-10-CM

## 2018-10-14 DIAGNOSIS — I480 Paroxysmal atrial fibrillation: Secondary | ICD-10-CM

## 2018-10-14 DIAGNOSIS — I483 Typical atrial flutter: Secondary | ICD-10-CM

## 2018-10-14 DIAGNOSIS — Z01812 Encounter for preprocedural laboratory examination: Secondary | ICD-10-CM

## 2018-10-14 LAB — POCT INR: INR: 3.2 — AB (ref 2.0–3.0)

## 2018-10-14 NOTE — Patient Instructions (Signed)
Description   Take half a tablet tonight, and then continue taking 1 tablet daily.  Recheck INR in 1 week (DCCV 7/20). Coumadin Clinic [phone]

## 2018-10-15 LAB — BASIC METABOLIC PANEL
BUN/Creatinine Ratio: 13 (ref 10–24)
BUN: 19 mg/dL (ref 8–27)
CO2: 25 mmol/L (ref 20–29)
Calcium: 9.2 mg/dL (ref 8.6–10.2)
Chloride: 101 mmol/L (ref 96–106)
Creatinine, Ser: 1.51 mg/dL — ABNORMAL HIGH (ref 0.76–1.27)
GFR calc Af Amer: 50 mL/min/{1.73_m2} — ABNORMAL LOW (ref >59)
GFR calc non Af Amer: 44 mL/min/{1.73_m2} — ABNORMAL LOW (ref >59)
Glucose: 162 mg/dL — ABNORMAL HIGH (ref 65–99)
Potassium: 4.5 mmol/L (ref 3.5–5.2)
Sodium: 141 mmol/L (ref 134–144)

## 2018-10-15 LAB — CBC
Hematocrit: 36.5 % — ABNORMAL LOW (ref 37.5–51.0)
Hemoglobin: 12.2 g/dL — ABNORMAL LOW (ref 13.0–17.7)
MCH: 29.6 pg (ref 26.6–33.0)
MCHC: 33.4 g/dL (ref 31.5–35.7)
MCV: 89 fL (ref 79–97)
Platelets: 129 10*3/uL — ABNORMAL LOW (ref 150–450)
RBC: 4.12 x10E6/uL — ABNORMAL LOW (ref 4.14–5.80)
RDW: 12.9 % (ref 11.6–15.4)
WBC: 4.7 10*3/uL (ref 3.4–10.8)

## 2018-10-17 ENCOUNTER — Telehealth: Payer: Self-pay

## 2018-10-17 NOTE — Telephone Encounter (Signed)
Notes recorded by Frederik Schmidt, RN on 10/17/2018 at 8:23 AM EDT  The patient has been notified of the result and verbalized understanding. All questions (if any) were answered.  Frederik Schmidt, RN 10/17/2018 8:23 AM

## 2018-10-17 NOTE — Telephone Encounter (Signed)
-----   Message from Jerline Pain, MD sent at 10/15/2018  6:54 AM EDT ----- Stable labs, no changes from before [name], MD

## 2018-10-18 ENCOUNTER — Telehealth: Payer: Self-pay

## 2018-10-18 NOTE — Telephone Encounter (Signed)

## 2018-10-21 ENCOUNTER — Other Ambulatory Visit (HOSPITAL_COMMUNITY): Admission: RE | Admit: 2018-10-21 | Payer: Medicare Other | Source: Ambulatory Visit

## 2018-10-21 ENCOUNTER — Other Ambulatory Visit (HOSPITAL_COMMUNITY)
Admission: RE | Admit: 2018-10-21 | Discharge: 2018-10-21 | Disposition: A | Discharge status: Home / Self Care | Payer: Medicare Other | Source: Ambulatory Visit | Attending: Cardiovascular Disease | Admitting: Cardiovascular Disease

## 2018-10-21 ENCOUNTER — Other Ambulatory Visit (HOSPITAL_COMMUNITY): Payer: Medicare Other

## 2018-10-21 DIAGNOSIS — Z1159 Encounter for screening for other viral diseases: Secondary | ICD-10-CM | POA: Diagnosis not present

## 2018-10-21 LAB — SARS CORONAVIRUS 2 (TAT 6-24 HRS): SARS Coronavirus 2: NEGATIVE

## 2018-10-24 ENCOUNTER — Encounter (HOSPITAL_COMMUNITY)
Admission: RE | Disposition: A | Discharge status: Home / Self Care | Payer: Medicare Other | Source: Home / Self Care | Attending: Cardiovascular Disease

## 2018-10-24 ENCOUNTER — Other Ambulatory Visit: Payer: Self-pay

## 2018-10-24 ENCOUNTER — Ambulatory Visit (HOSPITAL_COMMUNITY): Payer: Medicare Other | Admitting: Anesthesiology

## 2018-10-24 ENCOUNTER — Ambulatory Visit (HOSPITAL_COMMUNITY)
Admission: RE | Admit: 2018-10-24 | Discharge: 2018-10-24 | Disposition: A | Discharge status: Home / Self Care | Payer: Medicare Other | Attending: Cardiovascular Disease | Admitting: Cardiovascular Disease

## 2018-10-24 ENCOUNTER — Encounter (HOSPITAL_COMMUNITY): Payer: Self-pay

## 2018-10-24 DIAGNOSIS — E785 Hyperlipidemia, unspecified: Secondary | ICD-10-CM | POA: Diagnosis not present

## 2018-10-24 DIAGNOSIS — Z888 Allergy status to other drugs, medicaments and biological substances status: Secondary | ICD-10-CM | POA: Insufficient documentation

## 2018-10-24 DIAGNOSIS — Z8249 Family history of ischemic heart disease and other diseases of the circulatory system: Secondary | ICD-10-CM | POA: Diagnosis not present

## 2018-10-24 DIAGNOSIS — I4891 Unspecified atrial fibrillation: Secondary | ICD-10-CM | POA: Diagnosis not present

## 2018-10-24 DIAGNOSIS — E1122 Type 2 diabetes mellitus with diabetic chronic kidney disease: Secondary | ICD-10-CM | POA: Diagnosis not present

## 2018-10-24 DIAGNOSIS — Z7901 Long term (current) use of anticoagulants: Secondary | ICD-10-CM | POA: Diagnosis not present

## 2018-10-24 DIAGNOSIS — I4819 Other persistent atrial fibrillation: Secondary | ICD-10-CM | POA: Diagnosis not present

## 2018-10-24 DIAGNOSIS — Z7984 Long term (current) use of oral hypoglycemic drugs: Secondary | ICD-10-CM | POA: Insufficient documentation

## 2018-10-24 DIAGNOSIS — Z79899 Other long term (current) drug therapy: Secondary | ICD-10-CM | POA: Insufficient documentation

## 2018-10-24 DIAGNOSIS — I428 Other cardiomyopathies: Secondary | ICD-10-CM | POA: Diagnosis not present

## 2018-10-24 DIAGNOSIS — N183 Chronic kidney disease, stage 3 (moderate): Secondary | ICD-10-CM | POA: Diagnosis not present

## 2018-10-24 DIAGNOSIS — I2729 Other secondary pulmonary hypertension: Secondary | ICD-10-CM | POA: Diagnosis not present

## 2018-10-24 DIAGNOSIS — Z95810 Presence of automatic (implantable) cardiac defibrillator: Secondary | ICD-10-CM | POA: Diagnosis not present

## 2018-10-24 DIAGNOSIS — I5022 Chronic systolic (congestive) heart failure: Secondary | ICD-10-CM | POA: Insufficient documentation

## 2018-10-24 DIAGNOSIS — I130 Hypertensive heart and chronic kidney disease with heart failure and stage 1 through stage 4 chronic kidney disease, or unspecified chronic kidney disease: Secondary | ICD-10-CM | POA: Insufficient documentation

## 2018-10-24 DIAGNOSIS — I110 Hypertensive heart disease with heart failure: Secondary | ICD-10-CM | POA: Diagnosis not present

## 2018-10-24 DIAGNOSIS — I4892 Unspecified atrial flutter: Secondary | ICD-10-CM | POA: Diagnosis not present

## 2018-10-24 DIAGNOSIS — I509 Heart failure, unspecified: Secondary | ICD-10-CM | POA: Diagnosis not present

## 2018-10-24 HISTORY — PX: CARDIOVERSION: SHX1299

## 2018-10-24 LAB — BASIC METABOLIC PANEL
Anion gap: 9 (ref 5–15)
BUN: 24 mg/dL — ABNORMAL HIGH (ref 8–23)
CO2: 23 mmol/L (ref 22–32)
Calcium: 9.3 mg/dL (ref 8.9–10.3)
Chloride: 106 mmol/L (ref 98–111)
Creatinine, Ser: 1.71 mg/dL — ABNORMAL HIGH (ref 0.61–1.24)
GFR calc Af Amer: 43 mL/min — ABNORMAL LOW (ref >60)
GFR calc non Af Amer: 38 mL/min — ABNORMAL LOW (ref >60)
Glucose, Bld: 134 mg/dL — ABNORMAL HIGH (ref 70–99)
Potassium: 4.3 mmol/L (ref 3.5–5.1)
Sodium: 138 mmol/L (ref 135–145)

## 2018-10-24 LAB — GLUCOSE, CAPILLARY: Glucose-Capillary: 94 mg/dL (ref 70–99)

## 2018-10-24 SURGERY — CARDIOVERSION
Anesthesia: General

## 2018-10-24 MED ORDER — PHENYLEPHRINE 40 MCG/ML (10ML) SYRINGE FOR IV PUSH (FOR BLOOD PRESSURE SUPPORT)
PREFILLED_SYRINGE | INTRAVENOUS | Status: DC | PRN
  Administered 2018-10-24: 60 ug via INTRAVENOUS

## 2018-10-24 MED ORDER — PROPOFOL 10 MG/ML IV BOLUS
INTRAVENOUS | Status: DC | PRN
  Administered 2018-10-24: 80 mg via INTRAVENOUS

## 2018-10-24 MED ORDER — SODIUM CHLORIDE 0.9 % IV SOLN
INTRAVENOUS | Status: DC | PRN
  Administered 2018-10-24: 10:00:00 via INTRAVENOUS

## 2018-10-24 MED ORDER — LIDOCAINE HCL (CARDIAC) PF 100 MG/5ML IV SOSY
PREFILLED_SYRINGE | INTRAVENOUS | Status: DC | PRN
  Administered 2018-10-24: 60 mg via INTRAVENOUS

## 2018-10-24 NOTE — Progress Notes (Signed)
Patient had INR done on 10/14/2018. Per Dr. Acie Fredrickson this level is sufficient.

## 2018-10-24 NOTE — Anesthesia Preprocedure Evaluation (Signed)
Anesthesia Evaluation  Patient identified by MRN, date of birth, ID band Patient awake    Reviewed: Allergy & Precautions, NPO status , Patient's Chart, lab work & pertinent test results  Airway Mallampati: II  TM Distance: >3 FB Neck ROM: Full    Dental  (+) Upper Dentures   Pulmonary neg pulmonary ROS,    Pulmonary exam normal breath sounds clear to auscultation       Cardiovascular hypertension, Pt. on home beta blockers and Pt. on medications +CHF  + dysrhythmias Atrial Fibrillation + pacemaker + Valvular Problems/Murmurs AI  Rhythm:Irregular Rate:Normal     Neuro/Psych negative neurological ROS  negative psych ROS   GI/Hepatic negative GI ROS, Neg liver ROS,   Endo/Other  diabetes  Renal/GU Renal disease     Musculoskeletal negative musculoskeletal ROS (+)   Abdominal   Peds  Hematology  (+) anemia , HLD   Anesthesia Other Findings ATRIAL FIBRILLATION and ATRIAL FLUTTER  Reproductive/Obstetrics                             Anesthesia Physical Anesthesia Plan  ASA: III  Anesthesia Plan: General   Post-op Pain Management:    Induction: Intravenous  PONV Risk Score and Plan: 2 and Propofol infusion and Treatment may vary due to age or medical condition  Airway Management Planned: Mask  Additional Equipment:   Intra-op Plan:   Post-operative Plan:   Informed Consent: I have reviewed the patients History and Physical, chart, labs and discussed the procedure including the risks, benefits and alternatives for the proposed anesthesia with the patient or authorized representative who has indicated his/her understanding and acceptance.     Dental advisory given  Plan Discussed with: CRNA  Anesthesia Plan Comments:         Anesthesia Quick Evaluation

## 2018-10-24 NOTE — Anesthesia Postprocedure Evaluation (Signed)
Anesthesia Post Note  Patient: MATTHER LABELL  Procedure(s) Performed: CARDIOVERSION (N/A )     Patient location during evaluation: PACU Anesthesia Type: General Level of consciousness: awake and alert Pain management: pain level controlled Vital Signs Assessment: post-procedure vital signs reviewed and stable Respiratory status: spontaneous breathing, nonlabored ventilation, respiratory function stable and patient connected to nasal cannula oxygen Cardiovascular status: blood pressure returned to baseline and stable Postop Assessment: no apparent nausea or vomiting Anesthetic complications: no    Last Vitals:  Vitals:   10/24/18 1054 10/24/18 1109  BP: (!) 103/57 (!) 125/92  Pulse: 70 69  Resp: 17 (!) 21  Temp: 36.6 C   SpO2: 100% 100%    Last Pain:  Vitals:   10/24/18 1109  TempSrc:   PainSc: 0-No pain                 Ryan P Ellender

## 2018-10-24 NOTE — Anesthesia Procedure Notes (Signed)
Procedure Name: General with mask airway Date/Time: 10/24/2018 10:35 AM Performed by: Mariea Clonts, CRNA Pre-anesthesia Checklist: Timeout performed, Patient being monitored, Suction available, Emergency Drugs available and Patient identified Patient Re-evaluated:Patient Re-evaluated prior to induction Oxygen Delivery Method: Ambu bag Preoxygenation: Pre-oxygenation with 100% oxygen Induction Type: IV induction Ventilation: Mask ventilation without difficulty

## 2018-10-24 NOTE — Discharge Instructions (Signed)

## 2018-10-24 NOTE — Interval H&P Note (Signed)
History and Physical Interval Note:  10/24/2018 10:00 AM  Carlos Gibson  has presented today for surgery, with the diagnosis of ATRIAL FIBRILLATION and ATRIAL FLUTTER.  The various methods of treatment have been discussed with the patient and family. After consideration of risks, benefits and other options for treatment, the patient has consented to  Procedure(s): CARDIOVERSION (N/A) as a surgical intervention.  The patient's history has been reviewed, patient examined, no change in status, stable for surgery.  I have reviewed the patient's chart and labs.  Questions were answered to the patient's satisfaction.     [name]

## 2018-10-24 NOTE — CV Procedure (Addendum)
    Cardioversion Note  Carlos Gibson [id number] [birth year]  Procedure: DC Cardioversion Indications: atrial fib   Procedure Details Consent: Obtained Time Out: Verified patient identification, verified procedure, site/side was marked, verified correct patient position, special equipment/implants available, Radiology Safety Procedures followed,  medications/allergies/relevent history reviewed, required imaging and test results available.  Performed  The patient has been on adequate anticoagulation.  The patient received IV Lidocaine  60 mg IV followed by Propofol 80 mg  for sedation.  Synchronous cardioversion was performed at  150  joules.  The cardioversion was successful.  The patient was in NSR, A-pacing .   Rhythm verified by pacer interrogation Tomi Bamberger)     Complications: No apparent complications Patient did tolerate procedure well.   Thayer Headings, Brooke Bonito., MD, [facility] 10/24/2018, 10:54 AM

## 2018-10-24 NOTE — Transfer of Care (Signed)
Immediate Anesthesia Transfer of Care Note  Patient: Carlos Gibson  Procedure(s) Performed: CARDIOVERSION (N/A )  Patient Location: Endoscopy Unit  Anesthesia Type:General  Level of Consciousness: awake, alert  and oriented  Airway & Oxygen Therapy: Patient Spontanous Breathing and Patient connected to nasal cannula oxygen  Post-op Assessment: Report given to RN, Post -op Vital signs reviewed and stable and Patient moving all extremities X 4  Post vital signs: Reviewed and stable  Last Vitals:  Vitals Value Taken Time  BP    Temp    Pulse    Resp    SpO2      Last Pain:  Vitals:   10/24/18 0923  TempSrc: Oral  PainSc: 0-No pain         Complications: No apparent anesthesia complications

## 2018-10-24 NOTE — Interval H&P Note (Signed)
History and Physical Interval Note:  10/24/2018 10:17 AM  Carlos Gibson  has presented today for surgery, with the diagnosis of ATRIAL FIBRILLATION and ATRIAL FLUTTER.  The various methods of treatment have been discussed with the patient and family. After consideration of risks, benefits and other options for treatment, the patient has consented to  Procedure(s): CARDIOVERSION (N/A) as a surgical intervention.  The patient's history has been reviewed, patient examined, no change in status, stable for surgery.  I have reviewed the patient's chart and labs.  Questions were answered to the patient's satisfaction.     [name]

## 2018-10-25 ENCOUNTER — Encounter (HOSPITAL_COMMUNITY): Payer: Self-pay | Admitting: Cardiovascular Disease

## 2018-11-24 ENCOUNTER — Other Ambulatory Visit: Payer: Self-pay

## 2018-11-24 ENCOUNTER — Encounter (INDEPENDENT_AMBULATORY_CARE_PROVIDER_SITE_OTHER): Payer: Self-pay

## 2018-11-24 ENCOUNTER — Ambulatory Visit (INDEPENDENT_AMBULATORY_CARE_PROVIDER_SITE_OTHER): Payer: Medicare Other | Admitting: *Deleted

## 2018-11-24 DIAGNOSIS — I5022 Chronic systolic (congestive) heart failure: Secondary | ICD-10-CM | POA: Diagnosis not present

## 2018-11-24 DIAGNOSIS — I480 Paroxysmal atrial fibrillation: Secondary | ICD-10-CM | POA: Diagnosis not present

## 2018-11-24 DIAGNOSIS — I428 Other cardiomyopathies: Secondary | ICD-10-CM | POA: Diagnosis not present

## 2018-11-24 LAB — CUP PACEART INCLINIC DEVICE CHECK
Battery Remaining Longevity: 88 mo
Battery Voltage: 2.98 V
Brady Statistic AP VP Percent: 84.86 %
Brady Statistic AP VS Percent: 0.59 %
Brady Statistic AS VP Percent: 13.48 %
Brady Statistic AS VS Percent: 1.07 %
Brady Statistic RA Percent Paced: 75.02 %
Brady Statistic RV Percent Paced: 88.41 %
Date Time Interrogation Session: 20200820112417
HighPow Impedance: 60 Ohm
Implantable Lead Implant Date: 20191023
Implantable Lead Implant Date: 20191023
Implantable Lead Implant Date: 20191023
Implantable Lead Location: 753858
Implantable Lead Location: 753859
Implantable Lead Location: 753860
Implantable Lead Model: 4598
Implantable Lead Model: 5076
Implantable Lead Model: 6935
Implantable Pulse Generator Implant Date: 20191023
Lead Channel Impedance Value: 166.114
Lead Channel Impedance Value: 174.595
Lead Channel Impedance Value: 178.5 Ohm
Lead Channel Impedance Value: 180 Ohm
Lead Channel Impedance Value: 194.634
Lead Channel Impedance Value: 323 Ohm
Lead Channel Impedance Value: 342 Ohm
Lead Channel Impedance Value: 342 Ohm
Lead Channel Impedance Value: 380 Ohm
Lead Channel Impedance Value: 399 Ohm
Lead Channel Impedance Value: 456 Ohm
Lead Channel Impedance Value: 456 Ohm
Lead Channel Impedance Value: 532 Ohm
Lead Channel Impedance Value: 589 Ohm
Lead Channel Impedance Value: 627 Ohm
Lead Channel Impedance Value: 646 Ohm
Lead Channel Impedance Value: 646 Ohm
Lead Channel Impedance Value: 703 Ohm
Lead Channel Pacing Threshold Amplitude: 0.5 V
Lead Channel Pacing Threshold Amplitude: 0.875 V
Lead Channel Pacing Threshold Amplitude: 1.25 V
Lead Channel Pacing Threshold Pulse Width: 0.4 ms
Lead Channel Pacing Threshold Pulse Width: 0.4 ms
Lead Channel Pacing Threshold Pulse Width: 0.8 ms
Lead Channel Sensing Intrinsic Amplitude: 4 mV
Lead Channel Sensing Intrinsic Amplitude: 4.5 mV
Lead Channel Sensing Intrinsic Amplitude: 6 mV
Lead Channel Sensing Intrinsic Amplitude: 6.25 mV
Lead Channel Setting Pacing Amplitude: 1.75 V
Lead Channel Setting Pacing Amplitude: 2 V
Lead Channel Setting Pacing Amplitude: 2 V
Lead Channel Setting Pacing Pulse Width: 0.4 ms
Lead Channel Setting Pacing Pulse Width: 0.8 ms
Lead Channel Setting Sensing Sensitivity: 0.3 mV

## 2018-11-24 NOTE — Progress Notes (Signed)
CRT-D device check in office. Thresholds and sensing consistent with previous device measurements. Lead impedance trends stable over time. No mode switch episodes recorded. No ventricular arrhythmia episodes recorded. Patient bi-ventricularly pacing 87.22% of the time, this was likely decreased due to AFIB since last reset. Maintaining NSR since recent DCCV. Device programmed within appropriate safety margins/autocapture. Heart failure diagnostics reviewed and trends are stable for patient. Audible/vibratory alerts demonstrated for patient. No changes made this session. Estimated longevity 7 yr, 4 months.  Patient enrolled in remote follow up. Plan to check device remotely q 3 months and see Dr. Curt Bears in 01/2019 due to recent DCCV.

## 2018-12-06 DIAGNOSIS — D696 Thrombocytopenia, unspecified: Secondary | ICD-10-CM | POA: Diagnosis not present

## 2018-12-06 DIAGNOSIS — Z79899 Other long term (current) drug therapy: Secondary | ICD-10-CM | POA: Diagnosis not present

## 2018-12-06 DIAGNOSIS — E1169 Type 2 diabetes mellitus with other specified complication: Secondary | ICD-10-CM | POA: Diagnosis not present

## 2018-12-06 DIAGNOSIS — I483 Typical atrial flutter: Secondary | ICD-10-CM | POA: Diagnosis not present

## 2018-12-06 DIAGNOSIS — D6869 Other thrombophilia: Secondary | ICD-10-CM | POA: Diagnosis not present

## 2018-12-06 DIAGNOSIS — E7800 Pure hypercholesterolemia, unspecified: Secondary | ICD-10-CM | POA: Diagnosis not present

## 2018-12-06 DIAGNOSIS — Z1389 Encounter for screening for other disorder: Secondary | ICD-10-CM | POA: Diagnosis not present

## 2018-12-06 DIAGNOSIS — I10 Essential (primary) hypertension: Secondary | ICD-10-CM | POA: Diagnosis not present

## 2018-12-06 DIAGNOSIS — M25511 Pain in right shoulder: Secondary | ICD-10-CM | POA: Diagnosis not present

## 2018-12-06 DIAGNOSIS — Z0000 Encounter for general adult medical examination without abnormal findings: Secondary | ICD-10-CM | POA: Diagnosis not present

## 2018-12-06 DIAGNOSIS — I5020 Unspecified systolic (congestive) heart failure: Secondary | ICD-10-CM | POA: Diagnosis not present

## 2018-12-06 DIAGNOSIS — D649 Anemia, unspecified: Secondary | ICD-10-CM | POA: Diagnosis not present

## 2018-12-06 DIAGNOSIS — Z23 Encounter for immunization: Secondary | ICD-10-CM | POA: Diagnosis not present

## 2018-12-23 ENCOUNTER — Other Ambulatory Visit: Payer: Self-pay | Admitting: Physician Assistant

## 2018-12-23 ENCOUNTER — Other Ambulatory Visit: Payer: Self-pay | Admitting: Cardiology

## 2018-12-23 NOTE — Telephone Encounter (Signed)
Called spoke with pt, he missed his last appt on 10/21/18, was DCCV on 10/24/18 and is overdue for an INR check.  Made appt for 12/29/18.

## 2018-12-29 ENCOUNTER — Ambulatory Visit (INDEPENDENT_AMBULATORY_CARE_PROVIDER_SITE_OTHER): Payer: Medicare Other

## 2018-12-29 ENCOUNTER — Other Ambulatory Visit: Payer: Self-pay

## 2018-12-29 DIAGNOSIS — I483 Typical atrial flutter: Secondary | ICD-10-CM

## 2018-12-29 DIAGNOSIS — Z7901 Long term (current) use of anticoagulants: Secondary | ICD-10-CM

## 2018-12-29 LAB — POCT INR: INR: 2.7 (ref 2.0–3.0)

## 2018-12-29 NOTE — Patient Instructions (Signed)
Description   Continue on same dosage 1 tablet daily.  Recheck INR in 6 weeks. Coumadin Clinic [phone]

## 2019-01-02 DIAGNOSIS — I10 Essential (primary) hypertension: Secondary | ICD-10-CM | POA: Diagnosis not present

## 2019-01-02 DIAGNOSIS — N400 Enlarged prostate without lower urinary tract symptoms: Secondary | ICD-10-CM | POA: Diagnosis not present

## 2019-01-02 DIAGNOSIS — E1169 Type 2 diabetes mellitus with other specified complication: Secondary | ICD-10-CM | POA: Diagnosis not present

## 2019-01-02 DIAGNOSIS — I5020 Unspecified systolic (congestive) heart failure: Secondary | ICD-10-CM | POA: Diagnosis not present

## 2019-01-02 DIAGNOSIS — E7800 Pure hypercholesterolemia, unspecified: Secondary | ICD-10-CM | POA: Diagnosis not present

## 2019-01-03 ENCOUNTER — Ambulatory Visit (INDEPENDENT_AMBULATORY_CARE_PROVIDER_SITE_OTHER): Payer: Medicare Other | Admitting: Orthopaedic Surgery

## 2019-01-03 ENCOUNTER — Encounter: Payer: Self-pay | Admitting: Orthopaedic Surgery

## 2019-01-03 ENCOUNTER — Ambulatory Visit (INDEPENDENT_AMBULATORY_CARE_PROVIDER_SITE_OTHER): Payer: Medicare Other

## 2019-01-03 ENCOUNTER — Other Ambulatory Visit: Payer: Self-pay

## 2019-01-03 ENCOUNTER — Ambulatory Visit (INDEPENDENT_AMBULATORY_CARE_PROVIDER_SITE_OTHER): Payer: Medicare Other | Admitting: *Deleted

## 2019-01-03 VITALS — BP 113/56 | HR 70 | Ht 71.0 in | Wt 175.0 lb

## 2019-01-03 DIAGNOSIS — M7501 Adhesive capsulitis of right shoulder: Secondary | ICD-10-CM

## 2019-01-03 DIAGNOSIS — M25511 Pain in right shoulder: Secondary | ICD-10-CM

## 2019-01-03 DIAGNOSIS — I428 Other cardiomyopathies: Secondary | ICD-10-CM

## 2019-01-03 DIAGNOSIS — M7500 Adhesive capsulitis of unspecified shoulder: Secondary | ICD-10-CM

## 2019-01-03 DIAGNOSIS — I5022 Chronic systolic (congestive) heart failure: Secondary | ICD-10-CM

## 2019-01-03 DIAGNOSIS — E11618 Type 2 diabetes mellitus with other diabetic arthropathy: Secondary | ICD-10-CM | POA: Diagnosis not present

## 2019-01-03 DIAGNOSIS — G8929 Other chronic pain: Secondary | ICD-10-CM

## 2019-01-03 LAB — CUP PACEART REMOTE DEVICE CHECK
Battery Remaining Longevity: 85 mo
Battery Voltage: 2.99 V
Brady Statistic AP VP Percent: 82.63 %
Brady Statistic AP VS Percent: 0.68 %
Brady Statistic AS VP Percent: 15.36 %
Brady Statistic AS VS Percent: 1.32 %
Brady Statistic RA Percent Paced: 70.84 %
Brady Statistic RV Percent Paced: 85.81 %
Date Time Interrogation Session: 20200929041703
HighPow Impedance: 60 Ohm
Implantable Lead Implant Date: 20191023
Implantable Lead Implant Date: 20191023
Implantable Lead Implant Date: 20191023
Implantable Lead Location: 753858
Implantable Lead Location: 753859
Implantable Lead Location: 753860
Implantable Lead Model: 4598
Implantable Lead Model: 5076
Implantable Lead Model: 6935
Implantable Pulse Generator Implant Date: 20191023
Lead Channel Impedance Value: 171 Ohm
Lead Channel Impedance Value: 180 Ohm
Lead Channel Impedance Value: 180 Ohm
Lead Channel Impedance Value: 184.154
Lead Channel Impedance Value: 194.634
Lead Channel Impedance Value: 304 Ohm
Lead Channel Impedance Value: 342 Ohm
Lead Channel Impedance Value: 342 Ohm
Lead Channel Impedance Value: 380 Ohm
Lead Channel Impedance Value: 380 Ohm
Lead Channel Impedance Value: 399 Ohm
Lead Channel Impedance Value: 399 Ohm
Lead Channel Impedance Value: 532 Ohm
Lead Channel Impedance Value: 627 Ohm
Lead Channel Impedance Value: 627 Ohm
Lead Channel Impedance Value: 627 Ohm
Lead Channel Impedance Value: 665 Ohm
Lead Channel Impedance Value: 665 Ohm
Lead Channel Pacing Threshold Amplitude: 0.5 V
Lead Channel Pacing Threshold Amplitude: 0.625 V
Lead Channel Pacing Threshold Amplitude: 1.25 V
Lead Channel Pacing Threshold Pulse Width: 0.4 ms
Lead Channel Pacing Threshold Pulse Width: 0.4 ms
Lead Channel Pacing Threshold Pulse Width: 0.8 ms
Lead Channel Sensing Intrinsic Amplitude: 3.5 mV
Lead Channel Sensing Intrinsic Amplitude: 3.5 mV
Lead Channel Sensing Intrinsic Amplitude: 5.625 mV
Lead Channel Sensing Intrinsic Amplitude: 5.625 mV
Lead Channel Setting Pacing Amplitude: 1.5 V
Lead Channel Setting Pacing Amplitude: 1.75 V
Lead Channel Setting Pacing Amplitude: 2 V
Lead Channel Setting Pacing Pulse Width: 0.4 ms
Lead Channel Setting Pacing Pulse Width: 0.8 ms
Lead Channel Setting Sensing Sensitivity: 0.3 mV

## 2019-01-03 NOTE — Progress Notes (Signed)
Office Visit Note   Patient: Carlos Gibson           Date of Birth: November 23, 1940           MRN: [id number] Visit Date: 01/03/2019              Requested by: [name], [place]. [employer] [place],  [place] [address] PCP: [name], MD   Assessment & Plan: Visit Diagnoses:  1. Chronic right shoulder pain   2. Adhesive capsulitis of right shoulder   3. 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  We will send him to physical therapy for range of motion exercises and strengthening. #2: Follow back up with [place] in 4 weeks for recheck evaluation.  Certainly if he lacks improvement we could do a corticosteroid injection intra-articularly and extra-articularly.  Follow-Up Instructions: Return in about 4 weeks (around 01/31/2019).   Orders:  Orders Placed This Encounter  Procedures  . XR Shoulder Right   No orders of the defined types were placed in this encounter.     Procedures: No procedures performed   Clinical Data: No additional findings.   Subjective: Chief Complaint  Patient presents with  . Right Shoulder - Pain   HPI Patient presents today for right shoulder pain X 4-5 weeks. No known injury. He states that the pain moves around in his shoulder and radiates down his arm. He saw his PCP and was referred here. He said that his pain has basically went away since the referral.  Apparently he was placed on a prednisone taper after his visit with Dr. Felipa Eth on 06 December 2018.  Believe that is now caused him to be pretty much asymptomatic.  However unfortunately he has difficulty with motion now.  He states he had good motion prior to this episode of pain and discomfort in his right shoulder.  He was given Tramadol to take for pain. He also takes tylenol and aspirin.    Review of Systems   Constitutional: Negative for fatigue.  HENT: Negative for ear pain.   Eyes: Negative for pain.  Respiratory: Negative for shortness of breath.   Cardiovascular: Negative for leg swelling.  Gastrointestinal: Negative for constipation and diarrhea.  Endocrine: Positive for cold intolerance. Negative for heat intolerance.  Genitourinary: Negative for difficulty urinating.  Skin: Negative for rash.  Allergic/Immunologic: Negative for food allergies.  Neurological: Negative for weakness.  Hematological: Does not bruise/bleed easily.  Psychiatric/Behavioral: Positive for sleep disturbance.     Objective: Vital Signs: BP (!) 113/56   Pulse 70   Ht 5\' 11"  (1.803 m)   Wt 175 lb (79.4 kg)   BMI 24.41 kg/m   Physical Exam Constitutional:      Appearance: Normal appearance. He is well-developed.  HENT:     Head: Normocephalic.  Eyes:     Pupils: Pupils are equal, round, and reactive to light.  Pulmonary:     Effort: Pulmonary effort is normal.  Skin:    General: Skin is warm and dry.  Neurological:     Mental Status: He is alert and oriented to person, place, and time.  Psychiatric:        Behavior: Behavior normal.     Ortho Exam  Exam today reveals limited range of motion of the right shoulder.  He has near full motion on his left shoulder.  Right shoulder today I can get him to about 45 degrees of abduction and forward flexion and that causes him significant pain discomfort.  He has limited internal and external rotation at 45 degrees of abduction and forward flexion.  With the arm at his side he is able to internally rotate and put his palm against his abdomen.  Strength wise he is at 45 degrees of abduction he is strong.  Specialty Comments:  No specialty comments available.  Imaging: Xr Shoulder Right  Result Date: 01/03/2019 4 view x-ray of the right shoulder reveals a type III acromium.  He is got significant AC degenerative changes noted.  Periarticular spurring of  the [facility] joint is also noted.  Still maintaining a good joint space.    PMFS History: Current Outpatient Medications  Medication Sig Dispense Refill  . acetaminophen (TYLENOL) 650 MG CR tablet Take 650 mg by mouth every 8 (eight) hours as needed for pain.    [name] alfuzosin (UROXATRAL) 10 MG 24 hr tablet Take 10 mg by mouth at bedtime.   3  . atorvastatin (LIPITOR) 10 MG tablet Take 10 mg by mouth at bedtime.    . bisoprolol (ZEBETA) 5 MG tablet TAKE 1 TABLET BY MOUTH EVERY DAY 90 tablet 2  . Cholecalciferol (VITAMIN D3) 25 MCG (1000 UT) CAPS Take 1,000 Units by mouth every other day.     . finasteride (PROSCAR) 5 MG tablet Take 5 mg by mouth daily.   2  . furosemide (LASIX) 40 MG tablet Take 2 tablets in the morning (Patient taking differently: Take 40 mg by mouth every morning. ) 60 tablet 8  . glimepiride (AMARYL) 2 MG tablet Take 2 mg by mouth at bedtime.     [name] losartan (COZAAR) 25 MG tablet TAKE 1 TABLET(25 MG) BY MOUTH DAILY 90 tablet 1  . potassium chloride (K-DUR,KLOR-CON) 10 MEQ tablet Take 2 tablets (20 mEq total) by mouth daily. 60 tablet 11  . spironolactone (ALDACTONE) 25 MG tablet TAKE 1/2 TABLET(12.5 MG) BY MOUTH DAILY 45 tablet 3  . warfarin (COUMADIN) 5 MG tablet TAKE AS DIRECTED BY COUMADIN CLINIC 90 tablet 0   No current facility-administered medications for this visit.     Patient Active Problem List   Diagnosis Date Noted  . Frozen shoulder syndrome 01/03/2019  . 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 Other persistent atrial fibrillation   . Atrial flutter ([place]) 02/10/2018  . Long term (current) use of anticoagulants 02/10/2018  . Nonischemic cardiomyopathy ([place]) 01/26/2018  . Hypokalemia 01/01/2016  . CKD (chronic kidney disease), stage III ([place]) 01/01/2016  . Chronic systolic heart failure ([place]) 05/06/2015  . Secondary pulmonary hypertension 01/31/2015  . Aortic valve calcification   . Aortic valve regurgitation   .  Aortic root dilation (H[facility])   . Mild diastolic dysfunction   . Gilbert's syndrome   . Thrombocytopenia ([place])   . Acute on chronic systolic CHF (congestive heart failure) ([place]) 12/20/2014  . LAFB (left anterior fascicular block) 12/13/2014  . Aortic regurgitation 03/05/2014  . Dilated aortic root ([place]) 03/05/2014  . Essential hypertension 03/05/2014  . Right bundle branch block 03/05/2014  . Bifascicular block 03/05/2014  . Hyperlipidemia 03/05/2014   Past Medical History:  Diagnosis Date  . Aortic regurgitation    a. mild AI by echo 01/2016.  [name] Aortic root dilation ([facility])   . Aortic valve regurgitation   . Bifascicular  block   . Chronic systolic CHF (congestive heart failure) ([place])   . CKD (chronic kidney disease), stage III ([place]) 01/01/2016  . Diabetes ([place])   . Ejection fraction < 50%   . Frequent PVCs   . Gilbert's syndrome   . Hyperlipidemia   . Mild diastolic dysfunction   . Mitral regurgitation    a. mod by echo 10/207.  [name] NICM (nonischemic cardiomyopathy) ([place])   . Pericardial effusion    a. small by echo 01/2016.  . Pulmonary hypertension ([place])   . Right BBB/left post fasc block   . Thrombocytopenia ([place])     Family History  Problem Relation Age of Onset  . Hypertension Mother   . Stroke Mother   . Irregular heart beat Father   . Unexplained death Father     Past Surgical History:  Procedure Laterality Date  . BIV ICD INSERTION CRT-D N/A 01/26/2018   Procedure: BIV ICD INSERTION CRT-D;  Surgeon: Constance Haw, MD;  Location: [facility];  Service: Cardiovascular;  Laterality: N/A;  . CARDIAC CATHETERIZATION N/A 12/20/2014   Procedure: Right/Left Heart Cath and Coronary Angiography;  Surgeon: Jerline Pain, MD;  Location: [facility];  Service: Cardiovascular;  Laterality: N/A;  . CARDIOVERSION N/A 10/24/2018   Procedure: CARDIOVERSION;  Surgeon: Thayer Headings, MD;  Location: [facility];  Service: Cardiovascular;  Laterality: N/A;  .  HERNIA REPAIR  06/2017   Social History   Occupational History  . Not on file  Tobacco Use  . Smoking status: Never Smoker  . Smokeless tobacco: Never Used  Substance and Sexual Activity  . Alcohol use: No    Alcohol/week: 0.0 standard drinks  . Drug use: No  . Sexual activity: Not on file

## 2019-01-04 ENCOUNTER — Other Ambulatory Visit: Payer: Self-pay

## 2019-01-04 ENCOUNTER — Ambulatory Visit: Payer: Medicare Other | Attending: Orthopaedic Surgery | Admitting: Physical Therapy

## 2019-01-04 ENCOUNTER — Encounter: Payer: Self-pay | Admitting: Physical Therapy

## 2019-01-04 DIAGNOSIS — M25611 Stiffness of right shoulder, not elsewhere classified: Secondary | ICD-10-CM | POA: Insufficient documentation

## 2019-01-04 DIAGNOSIS — M25511 Pain in right shoulder: Secondary | ICD-10-CM | POA: Insufficient documentation

## 2019-01-04 DIAGNOSIS — M6281 Muscle weakness (generalized): Secondary | ICD-10-CM

## 2019-01-04 NOTE — Therapy (Signed)
[place], [place], [address] Phone: [phone]   Fax:  [phone]  Physical Therapy Evaluation  Patient Details  Name: Carlos Gibson MRN: [id number] Date of Birth: 05/21/1940 Referring Provider (PT): Joni Fears, MD   Encounter Date: 01/04/2019  PT End of Session - 01/04/19 1612    Visit Number  1    Number of Visits  12    Date for PT Re-Evaluation  02/15/19    Authorization Type  [employer]-progress note by visit 10, KX at 39    PT Start Time  [id number]    PT Stop Time  1700    PT Time Calculation (min)  45 min    Activity Tolerance  Patient tolerated treatment well    Behavior During Therapy  [facility] for tasks assessed/performed       Past Medical History:  Diagnosis Date  . Aortic regurgitation    a. mild AI by echo 01/2016.  [name] Aortic root dilation (H[facility])   . Aortic valve regurgitation   . Bifascicular block   . Chronic systolic CHF (congestive heart failure) ([place])   . CKD (chronic kidney disease), stage III ([place]) 01/01/2016  . Diabetes ([place])   . Ejection fraction < 50%   . Frequent PVCs   . Gilbert's syndrome   . Hyperlipidemia   . Mild diastolic dysfunction   . Mitral regurgitation    a. mod by echo 10/207.  [name] NICM (nonischemic cardiomyopathy) ([place])   . Pericardial effusion    a. small by echo 01/2016.  . Pulmonary hypertension ([place])   . Right BBB/left post fasc block   . Thrombocytopenia ([place])     Past Surgical History:  Procedure Laterality Date  . BIV ICD INSERTION CRT-D N/A 01/26/2018   Procedure: BIV ICD INSERTION CRT-D;  Surgeon: Constance Haw, MD;  Location: [facility];  Service: Cardiovascular;  Laterality: N/A;  . CARDIAC CATHETERIZATION N/A 12/20/2014   Procedure: Right/Left Heart Cath and Coronary Angiography;  Surgeon: Jerline Pain, MD;  Location: [facility];  Service: Cardiovascular;  Laterality: N/A;  . CARDIOVERSION N/A 10/24/2018   Procedure: CARDIOVERSION;  Surgeon: Thayer Headings, MD;  Location: [facility] ENDOS[facility];  Service: Cardiovascular;  Laterality: N/A;  . HERNIA REPAIR  06/2017    There were no vitals filed for this visit.   Subjective Assessment - 01/04/19 1612    Subjective  Pt. reports onset of right shoulder pain and stiffness about 4-5 weeks ago. No mechansim of injury noted. Pt. saw PCP and was prescribed prednisone taper which helped with pain from baseline but he continues with moderate pain as well as significant stiffness in shoulder. He had X-rays of his shoulder which showed type III acromion as well as degenerative AC joint changes.    Pertinent History  pacemaker/defibrillator, diabetic, CHF, CKD    Limitations  House hold activities;Lifting    Diagnostic tests  X-rays    Patient Stated Goals  Improve shoulder motion    Currently in Pain?  Yes    Pain Score  5     Pain Location  Shoulder    Pain Orientation  Right    Pain Descriptors / Indicators  Aching    Pain Type  Acute pain    Pain Onset  More than a month ago    Pain Frequency  Intermittent    Aggravating Factors   arm use, shoulder ROM    Pain Relieving Factors  medication  Effect of Pain on Daily Activities  limits ability reaching for dressing, bathing, ADLs         [facility] PT Assessment - 01/04/19 0001      Assessment   Medical Diagnosis  Right shoulder pain, adhesive capsulitis    Referring Provider (PT)  Joni Fears, MD    Onset Date/Surgical Date  11/30/18    Hand Dominance  Right    Prior Therapy  none      Precautions   Precautions  --    Precaution Comments  Pacemaker/defibrillator-no estim      Restrictions   Weight Bearing Restrictions  No      Balance Screen   Has the patient fallen in the past 6 months  No      [place] residence    Living Arrangements  Children    Type of [place] to enter    Entrance Stairs-Number of Steps  4     Entrance Stairs-Rails  [place]  Multi-level    Additional Comments  lives in split level house with 6 stairs respectively to go upstairs/downstairs, living with son      Prior Function   Level of Independence  Independent with basic ADLs      Cognition   Overall Cognitive Status  Within Functional Limits for tasks assessed      Observation/Other Assessments   Focus on Therapeutic Outcomes (FOTO)   48% limited      Posture/Postural Control   Posture Comments  Mild rounding of bilat. shoulders      ROM / Strength   AROM / PROM / Strength  AROM;PROM;Strength      AROM   AROM Assessment Site  Shoulder    Right/Left Shoulder  Right;Left    Right Shoulder Flexion  45 Degrees    Right Shoulder ABduction  60 Degrees    Right Shoulder Internal Rotation  --   reach to gluteals   Right Shoulder External Rotation  40 Degrees   with arm at side   Left Shoulder Flexion  140 Degrees    Left Shoulder ABduction  140 Degrees    Left Shoulder Internal Rotation  --   reach to T12   Left Shoulder External Rotation  60 Degrees   with arm at side     PROM   Overall PROM Comments  capsular endfeels with PROM    PROM Assessment Site  Shoulder    Right/Left Shoulder  Right    Right Shoulder Flexion  70 Degrees    Right Shoulder ABduction  80 Degrees    Right Shoulder Internal Rotation  25 Degrees    Right Shoulder External Rotation  45 Degrees      Strength   Overall Strength Comments  unable to test formal MMT for right shoulder fleixon and abduction due to limited ROM but able to hold 5/ abduction contraction at 45 deg, flexion 4/5 at 45 deg    Strength Assessment Site  Shoulder;Elbow    Right/Left Shoulder  Right;Left    Right Shoulder Flexion  --   see above   Right Shoulder ABduction  --   see above   Right Shoulder Internal Rotation  5/5    Right Shoulder External Rotation  4+/5    Left Shoulder Flexion  5/5    Left Shoulder ABduction  5/5    Left Shoulder  Internal Rotation  5/5    Left Shoulder External Rotation  5/5    Right/Left Elbow  Right;Left    Right Elbow Flexion  5/5    Right Elbow Extension  5/5    Left Elbow Flexion  5/5    Left Elbow Extension  5/5                Objective measurements completed on examination: See above findings.      [facility] PT Treatment/Exercise - 01/04/19 0001      Exercises   Exercises  Shoulder      Shoulder Exercises: Supine   Other Supine Exercises  supine wand ER stretch x 10      Shoulder Exercises: Standing   Other Standing Exercises  pendulums x 20 reps, standing "saw" row x 10      Shoulder Exercises: Stretch   Table Stretch - Flexion  5 reps    Other Shoulder Stretches  sleeper stretch-instructed HEP with practice x 2 reps             PT Education - 01/04/19 1712    Education Details  HEP, POC, symptom etiology/typical course of progression    Person(s) Educated  Patient    Methods  Explanation;Demonstration;Tactile cues;Verbal cues;Handout    Comprehension  Returned demonstration;Verbalized understanding       PT Short Term Goals - 01/04/19 1717      PT SHORT TERM GOAL #1   Title  Independent with HEP    Baseline  needs HEP    Time  3    Period  Weeks    Status  New    Target Date  01/25/19      PT SHORT TERM GOAL #2   Title  Improve right shoulder AROM for flexion and abduction at least 10-20 deg each to work towards improved ability to use RUE for dressing and bathing    Baseline  flexion 45 deg, abduction 60 deg    Time  3    Period  Weeks    Status  New        PT Long Term Goals - 01/04/19 1719      PT LONG TERM GOAL #1   Title  Improve FOTO outcome measure score to 34% or less impairment    Baseline  48% limited    Time  6    Period  Weeks    Status  New    Target Date  02/15/19      PT LONG TERM GOAL #2   Title  Right shoulder AROM grossly WFL to dress and bathe with RUE use    Baseline  see objective-grossly limited    Time  6     Period  Weeks    Status  New    Target Date  02/15/19      PT LONG TERM GOAL #3   Title  Right shoulder strength grossly 5/5 to improve ability to carry grocery bags and perform work duties with son    Baseline  see objective    Time  6    Period  Weeks    Status  New    Target Date  02/15/19      PT LONG TERM GOAL #4   Title  Perform ADLs/IADLs as needed with right shoulder pain <2/10    Baseline  5/10    Time  6    Period  Weeks    Status  New    Target Date  02/15/19             Plan - 01/04/19 1713    Clinical Impression Statement  Pt. presents with right shoulder pain and stiffness/capsular restriction of AROM/PROM consistent with referring diagnosis adhesive capsulitis. Pt. would benefit from PT to help address current deficits to improve functional status with right UE use for ADLs and work activities.    Personal Factors and Comorbidities  Age;Comorbidity 3+    Comorbidities  diabetic, CHF/cardiac history, CKD    Examination-Activity Limitations  Bathing;Dressing;Hygiene/Grooming;Sleep;Lift;Carry;Reach Overhead    Examination-Participation Restrictions  Yard Work;Laundry;Cleaning;Community Activity;Meal Prep;Driving    Stability/Clinical Decision Making  Stable/Uncomplicated    Clinical Decision Making  Low    Rehab Potential  Good    PT Frequency  2x / week    PT Duration  6 weeks    PT Treatment/Interventions  ADLs/Self Care Home Management;Cryotherapy;Moist Heat;Therapeutic exercise;Therapeutic activities;Neuromuscular re-education;Manual techniques;Dry needling;Passive range of motion;Taping    PT Next Visit Plan  No estim-pacemaker/defibrillator, review HEP as needed, right shoulder ROM focus with stretches, PROM, gentle manual    PT Home Exercise Plan  pendulums, "saw" row, table slide flexion stretch, wand ER stretch, sleeper stretch for IR    Consulted and Agree with Plan of Care  Patient       Patient will benefit from skilled therapeutic intervention  in order to improve the following deficits and impairments:  Pain, Impaired flexibility, Decreased strength, Decreased range of motion, Impaired UE functional use, Hypomobility  Visit Diagnosis: Right shoulder pain, unspecified chronicity  Stiffness of right shoulder, not elsewhere classified  Muscle weakness (generalized)     Problem List Patient Active Problem List   Diagnosis Date Noted  . Frozen shoulder syndrome 01/03/2019  . 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 Other persistent atrial fibrillation   . Atrial flutter ([place]) 02/10/2018  . Long term (current) use of anticoagulants 02/10/2018  . Nonischemic cardiomyopathy ([place]) 01/26/2018  . Hypokalemia 01/01/2016  . CKD (chronic kidney disease), stage III ([place]) 01/01/2016  . Chronic systolic heart failure ([place]) 05/06/2015  . Secondary pulmonary hypertension 01/31/2015  . Aortic valve calcification   . Aortic valve regurgitation   . Aortic root dilation ([facility])   . Mild diastolic dysfunction   . Gilbert's syndrome   . Thrombocytopenia ([place])   . Acute on chronic systolic CHF (congestive heart failure) ([place]) 12/20/2014  . LAFB (left anterior fascicular block) 12/13/2014  . Aortic regurgitation 03/05/2014  . Dilated aortic root ([place]) 03/05/2014  . Essential hypertension 03/05/2014  . Right bundle branch block 03/05/2014  . Bifascicular block 03/05/2014  . Hyperlipidemia 03/05/2014    Beaulah Dinning, PT, DPT 01/04/19 5:23 PM  [facility] [address] [place], [place], [address] Phone: [phone]   Fax:  [phone]  Name: NOA LASKIN MRN: [id number] Date of Birth: 11-14-1940

## 2019-01-09 ENCOUNTER — Ambulatory Visit: Payer: Medicare Other | Attending: Orthopaedic Surgery | Admitting: Physical Therapy

## 2019-01-09 ENCOUNTER — Other Ambulatory Visit: Payer: Self-pay

## 2019-01-09 ENCOUNTER — Encounter: Payer: Self-pay | Admitting: Physical Therapy

## 2019-01-09 DIAGNOSIS — M25611 Stiffness of right shoulder, not elsewhere classified: Secondary | ICD-10-CM

## 2019-01-09 DIAGNOSIS — M6281 Muscle weakness (generalized): Secondary | ICD-10-CM | POA: Insufficient documentation

## 2019-01-09 DIAGNOSIS — M25511 Pain in right shoulder: Secondary | ICD-10-CM | POA: Insufficient documentation

## 2019-01-09 NOTE — Therapy (Signed)
[place], [place], [address] Phone: [phone]   Fax:  [phone]  Physical Therapy Treatment  Patient Details  Name: Carlos Gibson MRN: [id number] Date of Birth: [birth year] Referring Provider (PT): Joni Fears, MD   Encounter Date: 01/09/2019  PT End of Session - 01/09/19 1213    Visit Number  2    Number of Visits  12    Date for PT Re-Evaluation  02/15/19    Authorization Type  [employer]-progress note by visit 10, KX at 36    PT Start Time  1145    PT Stop Time  1225    PT Time Calculation (min)  40 min    Activity Tolerance  Patient tolerated treatment well    Behavior During Therapy  [facility] for tasks assessed/performed       Past Medical History:  Diagnosis Date  . Aortic regurgitation    a. mild AI by echo 01/2016.  [name] Aortic root dilation (HCC)   . Aortic valve regurgitation   . Bifascicular block   . Chronic systolic CHF (congestive heart failure) ([place])   . CKD (chronic kidney disease), stage III 01/01/2016  . Diabetes ([place])   . Ejection fraction < 50%   . Frequent PVCs   . Gilbert's syndrome   . Hyperlipidemia   . Mild diastolic dysfunction   . Mitral regurgitation    a. mod by echo 10/207.  [name] NICM (nonischemic cardiomyopathy) ([place])   . Pericardial effusion    a. small by echo 01/2016.  . Pulmonary hypertension ([place])   . Right BBB/left post fasc block   . Thrombocytopenia ([place])     Past Surgical History:  Procedure Laterality Date  . BIV ICD INSERTION CRT-D N/A 01/26/2018   Procedure: BIV ICD INSERTION CRT-D;  Surgeon: Constance Haw, MD;  Location: [place] [facility];  Service: Cardiovascular;  Laterality: N/A;  . CARDIAC CATHETERIZATION N/A 12/20/2014   Procedure: Right/Left Heart Cath and Coronary Angiography;  Surgeon: Jerline Pain, MD;  Location: [place] [facility];  Service: Cardiovascular;  Laterality: N/A;  . CARDIOVERSION N/A 10/24/2018   Procedure:  CARDIOVERSION;  Surgeon: Thayer Headings, MD;  Location: [facility] ENDOSCOPY;  Service: Cardiovascular;  Laterality: N/A;  . HERNIA REPAIR  06/2017    There were no vitals filed for this visit.  Subjective Assessment - 01/09/19 1153    Subjective  Pt. reports shoulder a little looser/less stiff with HEP. "Hurts just a little bit" in right anterior shoulder region. Good compliance with HEP for stretches.    Pertinent History  pacemaker/defibrillator, diabetic, CHF, CKD    Limitations  House hold activities;Lifting    Diagnostic tests  X-rays    Patient Stated Goals  Improve shoulder motion    Currently in Pain?  Yes    Pain Score  5     Pain Location  Shoulder    Pain Orientation  Right    Pain Descriptors / Indicators  Aching    Pain Type  Acute pain    Pain Onset  More than a month ago    Pain Frequency  Intermittent    Aggravating Factors   arm use, shoulder ROM    Pain Relieving Factors  medication    Effect of Pain on Daily Activities  limits ability for reaching for dressing, bathing, ADLs         [facility] PT Assessment - 01/09/19 0001      PROM  Right Shoulder Flexion  100 Degrees    Right Shoulder ABduction  95 Degrees                   [facility] Adult PT Treatment/Exercise - 01/09/19 0001      Shoulder Exercises: Supine   Other Supine Exercises  supine wand ER stretch 5" x 15 reps, supine wand flexion stretch/AAROM x 15 reps      Shoulder Exercises: Pulleys   Flexion  2 minutes    Scaption  2 minutes      Manual Therapy   Manual Therapy  Joint mobilization;Soft tissue mobilization;Passive ROM    Joint Mobilization  AP and caudal right GH mobilization grade I-III    Soft tissue mobilization  STM right posterior shoulder and subscapularis release    Passive ROM  Right shoulder 4-way             PT Education - 01/09/19 1212    Education Details  progress, exercises    Person(s) Educated  Patient    Methods  Explanation;Demonstration;Tactile cues;Verbal  cues    Comprehension  Verbalized understanding;Returned demonstration       PT Short Term Goals - 01/04/19 1717      PT SHORT TERM GOAL #1   Title  Independent with HEP    Baseline  needs HEP    Time  3    Period  Weeks    Status  New    Target Date  01/25/19      PT SHORT TERM GOAL #2   Title  Improve right shoulder AROM for flexion and abduction at least 10-20 deg each to work towards improved ability to use RUE for dressing and bathing    Baseline  flexion 45 deg, abduction 60 deg    Time  3    Period  Weeks    Status  New        PT Long Term Goals - 01/04/19 1719      PT LONG TERM GOAL #1   Title  Improve FOTO outcome measure score to 34% or less impairment    Baseline  48% limited    Time  6    Period  Weeks    Status  New    Target Date  02/15/19      PT LONG TERM GOAL #2   Title  Right shoulder AROM grossly WFL to dress and bathe with RUE use    Baseline  see objective-grossly limited    Time  6    Period  Weeks    Status  New    Target Date  02/15/19      PT LONG TERM GOAL #3   Title  Right shoulder strength grossly 5/5 to improve ability to carry grocery bags and perform work duties with son    Baseline  see objective    Time  6    Period  Weeks    Status  New    Target Date  02/15/19      PT LONG TERM GOAL #4   Title  Perform ADLs/IADLs as needed with right shoulder pain <2/10    Baseline  5/10    Time  6    Period  Weeks    Status  New    Target Date  02/15/19            Plan - 01/09/19 1237    Clinical Impression Statement  Still with shoulder stiffness limiting ability for reaching  ADLs but showing improvement with decreased tightness from baseline status. Mild soreness with PROM/stretches but overall session well-tolerated.    Personal Factors and Comorbidities  Age;Comorbidity 3+    Comorbidities  diabetic, CHF/cardiac history, CKD    Examination-Activity Limitations  Bathing;Dressing;Hygiene/Grooming;Sleep;Lift;Carry;Reach  Overhead    Examination-Participation Restrictions  Yard Work;Laundry;Cleaning;Community Activity;Meal Prep;Driving    Stability/Clinical Decision Making  Stable/Uncomplicated    Clinical Decision Making  Low    Rehab Potential  Good    PT Frequency  2x / week    PT Duration  6 weeks    PT Treatment/Interventions  ADLs/Self Care Home Management;Cryotherapy;Moist Heat;Therapeutic exercise;Therapeutic activities;Neuromuscular re-education;Manual techniques;Dry needling;Passive range of motion;Taping    PT Next Visit Plan  No estim-pacemaker/defibrillator, right shoulder ROM focus with stretches, PROM, gentle manual    PT Home Exercise Plan  pendulums, "saw" row, table slide flexion stretch, wand ER stretch, sleeper stretch for IR    Consulted and Agree with Plan of Care  Patient       Patient will benefit from skilled therapeutic intervention in order to improve the following deficits and impairments:  Pain, Impaired flexibility, Decreased strength, Decreased range of motion, Impaired UE functional use, Hypomobility  Visit Diagnosis: Right shoulder pain, unspecified chronicity  Stiffness of right shoulder, not elsewhere classified  Muscle weakness (generalized)     Problem List Patient Active Problem List   Diagnosis Date Noted  . Frozen shoulder syndrome 01/03/2019  . 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 Other persistent atrial fibrillation ([place])   . Atrial flutter ([place]) 02/10/2018  . Long term (current) use of anticoagulants 02/10/2018  . Nonischemic cardiomyopathy ([place]) 01/26/2018  . Hypokalemia 01/01/2016  . CKD (chronic kidney disease), stage III 01/01/2016  . Chronic systolic heart failure ([place]) 05/06/2015  . Secondary pulmonary hypertension 01/31/2015  . Aortic valve calcification   . Aortic valve regurgitation   . Aortic root dilation (HCC)   . Mild diastolic dysfunction   . Gilbert's syndrome   . Thrombocytopenia  ([place])   . Acute on chronic systolic CHF (congestive heart failure) ([place]) 12/20/2014  . LAFB (left anterior fascicular block) 12/13/2014  . Aortic regurgitation 03/05/2014  . Dilated aortic root ([place]) 03/05/2014  . Essential hypertension 03/05/2014  . Right bundle branch block 03/05/2014  . Bifascicular block 03/05/2014  . Hyperlipidemia 03/05/2014    Beaulah Dinning, PT, DPT 01/09/19 12:38 PM  [place] [facility] [address] [place], [place], [address] Phone: [phone]   Fax:  [phone]  Name: CHAZZ FOK MRN: [id number] Date of Birth: 17-Aug-1940

## 2019-01-12 ENCOUNTER — Telehealth: Payer: Self-pay

## 2019-01-12 NOTE — Progress Notes (Signed)
Remote ICD transmission.   

## 2019-01-12 NOTE — Telephone Encounter (Signed)

## 2019-01-13 ENCOUNTER — Telehealth (INDEPENDENT_AMBULATORY_CARE_PROVIDER_SITE_OTHER): Payer: Medicare Other | Admitting: Cardiology

## 2019-01-13 ENCOUNTER — Other Ambulatory Visit: Payer: Self-pay

## 2019-01-13 VITALS — Ht 71.0 in | Wt 175.0 lb

## 2019-01-13 DIAGNOSIS — I428 Other cardiomyopathies: Secondary | ICD-10-CM

## 2019-01-13 DIAGNOSIS — I5022 Chronic systolic (congestive) heart failure: Secondary | ICD-10-CM

## 2019-01-13 DIAGNOSIS — I480 Paroxysmal atrial fibrillation: Secondary | ICD-10-CM

## 2019-01-13 DIAGNOSIS — I10 Essential (primary) hypertension: Secondary | ICD-10-CM

## 2019-01-13 DIAGNOSIS — I493 Ventricular premature depolarization: Secondary | ICD-10-CM

## 2019-01-13 NOTE — Patient Instructions (Signed)
Medication Instructions:  Your physician recommends that you continue on your current medications as directed. Please refer to the Current Medication list given to you today.  If you need a refill on your cardiac medications before your next appointment, please call your pharmacy.   Lab work: none If you have labs (blood work) drawn today and your tests are completely normal, you will receive your results only by: [name] MyChart Message (if you have MyChart) OR . A paper copy in the mail If you have any lab test that is abnormal or we need to change your treatment, we will call you to review the results.  Testing/Procedures: none  Follow-Up: At [facility], you and your health needs are our priority.  As part of our continuing mission to provide you with exceptional heart care, we have created designated Provider Care Teams.  These Care Teams include your primary Cardiologist (physician) and Advanced Practice Providers (APPs -  Physician Assistants and Nurse Practitioners) who all work together to provide you with the care you need, when you need it. You will need a follow up appointment in 6 months.  Please call our office 2 months in advance to schedule this appointment.  You may see [name], MD or one of the following Advanced Practice Providers on your designated Care Team:   Truitt Merle, NP Cecilie Kicks, NP . Kathyrn Drown, NP  Any Other Special Instructions Will Be Listed Below (If Applicable).

## 2019-01-13 NOTE — Progress Notes (Signed)
Virtual Visit via Telephone Note   This visit type was conducted due to national recommendations for restrictions regarding the COVID-19 Pandemic (e.g. social distancing) in an effort to limit this patient's exposure and mitigate transmission in our community.  Due to his co-morbid illnesses, this patient is at least at moderate risk for complications without adequate follow up.  This format is felt to be most appropriate for this patient at this time.  The patient did not have access to video technology/had technical difficulties with video requiring transitioning to audio format only (telephone).  All issues noted in this document were discussed and addressed.  No physical exam could be performed with this format.  Please refer to the patient's chart for his  consent to telehealth for [facility].   Date:  01/13/2019   ID:  Carlos Gibson, DOB 05/08/1940, MRN [id number]  Patient Location: Home Provider Location: Home  PCP:  [name], MD  Cardiologist:  [name], MD  Electrophysiologist:  Constance Haw, MD   Evaluation Performed:  Follow-Up Visit  Chief Complaint:  Heart failure  History of Present Illness:    Carlos Gibson is a 78 y.o. male with nonischemic chronic cardiomyopathy normal coronaries, persistently low EF with Entresto use and Medtronic CRT-D-D implanted on 01/26/2018 with recent bout of atrial fibrillation.  Successful cardioversion on 10/24/2018.  He has been maintaining sinus rhythm when checked by Barrington Ellison, PA.  Feels better, going to work. Retired from [employer]. 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 Has some shoulder issues. Dr. Durward Fortes.   Unfortunately, his wife passed away in 08/01/18.  My condolences.  Misses her every day he states.  But he has been active with his son.  Moving forward.  Overall he has not been feeling any significant shortness of breath.  He has not had any syncope.  No palpitations felt.  No fevers chills nausea vomiting  syncope bleeding.  The patient does not have symptoms concerning for COVID-19 infection (fever, chills, cough, or new shortness of breath).    Past Medical History:  Diagnosis Date  . Aortic regurgitation    a. mild AI by echo 01/2016.  [name] Aortic root dilation (HCC)   . Aortic valve regurgitation   . Bifascicular block   . Chronic systolic CHF (congestive heart failure) ([place])   . CKD (chronic kidney disease), stage III 01/01/2016  . Diabetes ([place])   . Ejection fraction < 50%   . Frequent PVCs   . Gilbert's syndrome   . Hyperlipidemia   . Mild diastolic dysfunction   . Mitral regurgitation    a. mod by echo 10/207.  [name] NICM (nonischemic cardiomyopathy) ([place])   . Pericardial effusion    a. small by echo 01/2016.  . Pulmonary hypertension ([place])   . Right BBB/left post fasc block   . Thrombocytopenia ([place])    Past Surgical History:  Procedure Laterality Date  . BIV ICD INSERTION CRT-D N/A 01/26/2018   Procedure: BIV ICD INSERTION CRT-D;  Surgeon: Constance Haw, MD;  Location: [place] CV LAB;  Service: Cardiovascular;  Laterality: N/A;  . CARDIAC CATHETERIZATION N/A 12/20/2014   Procedure: Right/Left Heart Cath and Coronary Angiography;  Surgeon: Jerline Pain, MD;  Location: [place] [facility];  Service: Cardiovascular;  Laterality: N/A;  . CARDIOVERSION N/A 10/24/2018   Procedure: CARDIOVERSION;  Surgeon: Thayer Headings, MD;  Location: [facility] ENDOSCOPY;  Service: Cardiovascular;  Laterality: N/A;  . HERNIA REPAIR  06/2017  Current Meds  Medication Sig  . acetaminophen (TYLENOL) 650 MG CR tablet Take 650 mg by mouth every 8 (eight) hours as needed for pain.  [name] alfuzosin (UROXATRAL) 10 MG 24 hr tablet Take 10 mg by mouth at bedtime.   [name] atorvastatin (LIPITOR) 10 MG tablet Take 10 mg by mouth at bedtime.  . bisoprolol (ZEBETA) 5 MG tablet TAKE 1 TABLET BY MOUTH EVERY DAY  . Cholecalciferol (VITAMIN D3) 25 MCG (1000 UT) CAPS Take 1,000 Units by mouth every other day.    . finasteride (PROSCAR) 5 MG tablet Take 5 mg by mouth daily.   . furosemide (LASIX) 40 MG tablet Take 2 tablets in the morning  . glimepiride (AMARYL) 2 MG tablet Take 2 mg by mouth at bedtime.   [name] losartan (COZAAR) 25 MG tablet TAKE 1 TABLET(25 MG) BY MOUTH DAILY  . potassium chloride (K-DUR,KLOR-CON) 10 MEQ tablet Take 2 tablets (20 mEq total) by mouth daily.  [name] spironolactone (ALDACTONE) 25 MG tablet TAKE 1/2 TABLET(12.5 MG) BY MOUTH DAILY  . traMADol (ULTRAM) 50 MG tablet Take by mouth every 6 (six) hours as needed.  . warfarin (COUMADIN) 5 MG tablet TAKE AS DIRECTED BY COUMADIN CLINIC     Allergies:   Ace inhibitors, Coreg [carvedilol], and Toprol xl [metoprolol tartrate]   Social History   Tobacco Use  . Smoking status: Never Smoker  . Smokeless tobacco: Never Used  Substance Use Topics  . Alcohol use: No    Alcohol/week: 0.0 standard drinks  . Drug use: No     Family Hx: The patient's family history includes Hypertension in his mother; Irregular heart beat in his father; Stroke in his mother; Unexplained death in his father.  ROS:   Please see the history of present illness.     All other systems reviewed and are negative.   Prior CV studies:   The following studies were reviewed today:  ECHO 2019 - EF 25%  Labs/Other Tests and Data Reviewed:    EKG:  An ECG dated 10/24/18 was personally reviewed today and demonstrated:  AV paced  Recent Labs: 10/14/2018: Hemoglobin 12.2; Platelets 129 10/24/2018: BUN 24; Creatinine, Ser 1.71; Potassium 4.3; Sodium 138   Recent Lipid Panel No results found for: CHOL, TRIG, HDL, CHOLHDL, LDLCALC, LDLDIRECT  Wt Readings from Last 3 Encounters:  01/13/19 175 lb (79.4 kg)  01/03/19 175 lb (79.4 kg)  10/24/18 175 lb (79.4 kg)     Objective:    Vital Signs:  Ht 5\' 11"  (1.803 m)   Wt 175 lb (79.4 kg)   BMI 24.41 kg/m    VITAL SIGNS:  reviewed competes full sentences without difficultly. Alert. Pleasant.   ASSESSMENT &  PLAN:    Nonischemic cardiomyopathy - Continuing with current therapy.  He is off of the Oral.  He is on losartan.  Also on bisoprolol.  NYHA class I-II currently.  Most recent echo 04/29/2017-EF 25 to 30%.  PVCs -Minimal burden.  Paroxysmal atrial fibrillation/flutter - Successful cardioversion July 2020.  Maintaining sinus rhythm. -Appreciate the help from Dr. Curt Bears.  Essential hypertension -Well-controlled.  ICD-CRT-D -Dr. Curt Bears.  Doing well.  Demonstrating sinus rhythm.  COVID-19 Education: The signs and symptoms of COVID-19 were discussed with the patient and how to seek care for testing (follow up with PCP or arrange E-visit).  The importance of social distancing was discussed today.  Time:   Today, I have spent 20 minutes with the patient with telehealth technology discussing the above problems.  Medication Adjustments/Labs and Tests Ordered: Current medicines are reviewed at length with the patient today.  Concerns regarding medicines are outlined above.   Tests Ordered: No orders of the defined types were placed in this encounter.   Medication Changes: No orders of the defined types were placed in this encounter.   Follow Up:  In Person in 6 month(s)  Signed, [name], MD  01/13/2019 8:40 AM    [place]

## 2019-01-17 ENCOUNTER — Ambulatory Visit: Payer: Medicare Other | Admitting: Physical Therapy

## 2019-01-17 ENCOUNTER — Other Ambulatory Visit: Payer: Self-pay

## 2019-01-17 DIAGNOSIS — M25511 Pain in right shoulder: Secondary | ICD-10-CM

## 2019-01-17 DIAGNOSIS — M25611 Stiffness of right shoulder, not elsewhere classified: Secondary | ICD-10-CM

## 2019-01-17 DIAGNOSIS — M6281 Muscle weakness (generalized): Secondary | ICD-10-CM

## 2019-01-17 NOTE — Therapy (Signed)
[place], [place], [address] Phone: [phone]   Fax:  [phone]  Physical Therapy Treatment  Patient Details  Name: Carlos Gibson MRN: [id number] Date of Birth: 15-Jun-1940 Referring Provider (PT): Joni Fears, MD   Encounter Date: 01/17/2019  PT End of Session - 01/17/19 1600    Visit Number  3    Number of Visits  12    Date for PT Re-Evaluation  02/15/19    Authorization Type  [employer]-progress note by visit 10, KX at 63    PT Start Time  [id number]    PT Stop Time  1610    PT Time Calculation (min)  40 min    Activity Tolerance  Patient tolerated treatment well    Behavior During Therapy  [facility] for tasks assessed/performed       Past Medical History:  Diagnosis Date  . Aortic regurgitation    a. mild AI by echo 01/2016.  [name] Aortic root dilation (HCC)   . Aortic valve regurgitation   . Bifascicular block   . Chronic systolic CHF (congestive heart failure) ([place])   . CKD (chronic kidney disease), stage III 01/01/2016  . Diabetes ([place])   . Ejection fraction < 50%   . Frequent PVCs   . Gilbert's syndrome   . Hyperlipidemia   . Mild diastolic dysfunction   . Mitral regurgitation    a. mod by echo 10/207.  [name] NICM (nonischemic cardiomyopathy) ([place])   . Pericardial effusion    a. small by echo 01/2016.  . Pulmonary hypertension ([place])   . Right BBB/left post fasc block   . Thrombocytopenia ([place])     Past Surgical History:  Procedure Laterality Date  . BIV ICD INSERTION CRT-D N/A 01/26/2018   Procedure: BIV ICD INSERTION CRT-D;  Surgeon: Constance Haw, MD;  Location: [facility];  Service: Cardiovascular;  Laterality: N/A;  . CARDIAC CATHETERIZATION N/A 12/20/2014   Procedure: Right/Left Heart Cath and Coronary Angiography;  Surgeon: Jerline Pain, MD;  Location: [facility];  Service: Cardiovascular;  Laterality: N/A;  . CARDIOVERSION N/A 10/24/2018   Procedure:  CARDIOVERSION;  Surgeon: Thayer Headings, MD;  Location: [facility] ENDOSCOPY;  Service: Cardiovascular;  Laterality: N/A;  . HERNIA REPAIR  06/2017    There were no vitals filed for this visit.  Subjective Assessment - 01/17/19 1551    Subjective  "shoulder is coming along feels he has a litte more ROM. PT chris zoch has been doing a good job with it!"    Pertinent History  pacemaker/defibrillator, diabetic, CHF, CKD    Limitations  House hold activities;Lifting    Diagnostic tests  X-rays    Patient Stated Goals  Improve shoulder motion    Currently in Pain?  Yes    Pain Score  5     Pain Location  Shoulder    Pain Orientation  Right    Pain Descriptors / Indicators  Aching    Pain Type  Acute pain          [facility] Adult PT Treatment/Exercise - 01/17/19 0001      Shoulder Exercises: Supine   Other Supine Exercises  supine wand ER stretch 5" x 15 reps, supine wand flexion and abd stretch/AAROM x 15 reps ea      Shoulder Exercises: Pulleys   Flexion  2 minutes    Scaption  2 minutes      Shoulder Exercises: Stretch  Other Shoulder Stretches  table slides flex, abd, ER X 15 ea      Manual Therapy   Manual Therapy  Joint mobilization;Soft tissue mobilization;Passive ROM    Joint Mobilization  AP and caudal right GH mobilization grade I-III    Soft tissue mobilization  STM right posterior shoulder and pec release    Passive ROM  Right shoulder 4-way               PT Short Term Goals - 01/04/19 1717      PT SHORT TERM GOAL #1   Title  Independent with HEP    Baseline  needs HEP    Time  3    Period  Weeks    Status  New    Target Date  01/25/19      PT SHORT TERM GOAL #2   Title  Improve right shoulder AROM for flexion and abduction at least 10-20 deg each to work towards improved ability to use RUE for dressing and bathing    Baseline  flexion 45 deg, abduction 60 deg    Time  3    Period  Weeks    Status  New        PT Long Term Goals - 01/04/19 1719       PT LONG TERM GOAL #1   Title  Improve FOTO outcome measure score to 34% or less impairment    Baseline  48% limited    Time  6    Period  Weeks    Status  New    Target Date  02/15/19      PT LONG TERM GOAL #2   Title  Right shoulder AROM grossly WFL to dress and bathe with RUE use    Baseline  see objective-grossly limited    Time  6    Period  Weeks    Status  New    Target Date  02/15/19      PT LONG TERM GOAL #3   Title  Right shoulder strength grossly 5/5 to improve ability to carry grocery bags and perform work duties with son    Baseline  see objective    Time  6    Period  Weeks    Status  New    Target Date  02/15/19      PT LONG TERM GOAL #4   Title  Perform ADLs/IADLs as needed with right shoulder pain <2/10    Baseline  5/10    Time  6    Period  Weeks    Status  New    Target Date  02/15/19            Plan - 01/17/19 1601    Clinical Impression Statement  Session focused on ROM as this is his biggest deficit. He appears to be slowly progressing in this area and PT will continue to progress as able.    Personal Factors and Comorbidities  Age;Comorbidity 3+    Comorbidities  diabetic, CHF/cardiac history, CKD    Examination-Activity Limitations  Bathing;Dressing;Hygiene/Grooming;Sleep;Lift;Carry;Reach Overhead    Examination-Participation Restrictions  Yard Work;Laundry;Cleaning;Community Activity;Meal Prep;Driving    Stability/Clinical Decision Making  Stable/Uncomplicated    Rehab Potential  Good    PT Frequency  2x / week    PT Duration  6 weeks    PT Treatment/Interventions  ADLs/Self Care Home Management;Cryotherapy;Moist Heat;Therapeutic exercise;Therapeutic activities;Neuromuscular re-education;Manual techniques;Dry needling;Passive range of motion;Taping    PT Next Visit Plan  No estim-pacemaker/defibrillator, right  shoulder ROM focus with stretches, PROM, gentle manual    PT Home Exercise Plan  pendulums, "saw" row, table slide flexion  stretch, wand ER stretch, sleeper stretch for IR    Consulted and Agree with Plan of Care  Patient       Patient will benefit from skilled therapeutic intervention in order to improve the following deficits and impairments:  Pain, Impaired flexibility, Decreased strength, Decreased range of motion, Impaired UE functional use, Hypomobility  Visit Diagnosis: Right shoulder pain, unspecified chronicity  Stiffness of right shoulder, not elsewhere classified  Muscle weakness (generalized)     Problem List Patient Active Problem List   Diagnosis Date Noted  . Frozen shoulder syndrome 01/03/2019  . 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 Other persistent atrial fibrillation ([place])   . Atrial flutter ([place]) 02/10/2018  . Long term (current) use of anticoagulants 02/10/2018  . Nonischemic cardiomyopathy ([place]) 01/26/2018  . Hypokalemia 01/01/2016  . CKD (chronic kidney disease), stage III 01/01/2016  . Chronic systolic heart failure ([place]) 05/06/2015  . Secondary pulmonary hypertension 01/31/2015  . Aortic valve calcification   . Aortic valve regurgitation   . Aortic root dilation (HCC)   . Mild diastolic dysfunction   . Gilbert's syndrome   . Thrombocytopenia ([place])   . Acute on chronic systolic CHF (congestive heart failure) ([place]) 12/20/2014  . LAFB (left anterior fascicular block) 12/13/2014  . Aortic regurgitation 03/05/2014  . Dilated aortic root ([place]) 03/05/2014  . Essential hypertension 03/05/2014  . Right bundle branch block 03/05/2014  . Bifascicular block 03/05/2014  . Hyperlipidemia 03/05/2014    Silvestre Mesi 01/17/2019, 4:02 PM  [facility] [address] [place], [place], [address] Phone: [phone]   Fax:  [phone]  Name: Carlos Gibson MRN: [id number] Date of Birth: 1940-08-15

## 2019-01-18 ENCOUNTER — Ambulatory Visit: Payer: Medicare Other | Admitting: Physical Therapy

## 2019-01-19 DIAGNOSIS — E1169 Type 2 diabetes mellitus with other specified complication: Secondary | ICD-10-CM | POA: Diagnosis not present

## 2019-01-19 DIAGNOSIS — I10 Essential (primary) hypertension: Secondary | ICD-10-CM | POA: Diagnosis not present

## 2019-01-19 DIAGNOSIS — I5020 Unspecified systolic (congestive) heart failure: Secondary | ICD-10-CM | POA: Diagnosis not present

## 2019-01-19 DIAGNOSIS — N400 Enlarged prostate without lower urinary tract symptoms: Secondary | ICD-10-CM | POA: Diagnosis not present

## 2019-01-19 DIAGNOSIS — E7800 Pure hypercholesterolemia, unspecified: Secondary | ICD-10-CM | POA: Diagnosis not present

## 2019-01-23 ENCOUNTER — Other Ambulatory Visit: Payer: Self-pay

## 2019-01-23 ENCOUNTER — Ambulatory Visit: Payer: Medicare Other | Admitting: Physical Therapy

## 2019-01-23 ENCOUNTER — Encounter: Payer: Self-pay | Admitting: Physical Therapy

## 2019-01-23 DIAGNOSIS — M25511 Pain in right shoulder: Secondary | ICD-10-CM

## 2019-01-23 DIAGNOSIS — M25611 Stiffness of right shoulder, not elsewhere classified: Secondary | ICD-10-CM | POA: Diagnosis not present

## 2019-01-23 DIAGNOSIS — M6281 Muscle weakness (generalized): Secondary | ICD-10-CM | POA: Diagnosis not present

## 2019-01-23 NOTE — Therapy (Signed)
[place], [place], [address] Phone: [phone]   Fax:  [phone]  Physical Therapy Treatment  Patient Details  Name: Carlos Gibson MRN: [id number] Date of Birth: April 30, 1940 Referring Provider (PT): Joni Fears, MD   Encounter Date: 01/23/2019  PT End of Session - 01/23/19 1616    Visit Number  4    Number of Visits  12    Date for PT Re-Evaluation  02/15/19    Authorization Type  [employer]-progress note by visit 10, KX at 15    PT Start Time  [id number]    PT Stop Time  1624    PT Time Calculation (min)  40 min    Activity Tolerance  Patient tolerated treatment well    Behavior During Therapy  [facility] for tasks assessed/performed       Past Medical History:  Diagnosis Date  . Aortic regurgitation    a. mild AI by echo 01/2016.  [name] Aortic root dilation (HCC)   . Aortic valve regurgitation   . Bifascicular block   . Chronic systolic CHF (congestive heart failure) ([place])   . CKD (chronic kidney disease), stage III 01/01/2016  . Diabetes ([place])   . Ejection fraction < 50%   . Frequent PVCs   . Gilbert's syndrome   . Hyperlipidemia   . Mild diastolic dysfunction   . Mitral regurgitation    a. mod by echo 10/207.  [name] NICM (nonischemic cardiomyopathy) ([place])   . Pericardial effusion    a. small by echo 01/2016.  . Pulmonary hypertension ([place])   . Right BBB/left post fasc block   . Thrombocytopenia ([place])     Past Surgical History:  Procedure Laterality Date  . BIV ICD INSERTION CRT-D N/A 01/26/2018   Procedure: BIV ICD INSERTION CRT-D;  Surgeon: Constance Haw, MD;  Location: [place];  Service: Cardiovascular;  Laterality: N/A;  . CARDIAC CATHETERIZATION N/A 12/20/2014   Procedure: Right/Left Heart Cath and Coronary Angiography;  Surgeon: Jerline Pain, MD;  Location: [place] [facility] LAB;  Service: Cardiovascular;  Laterality: N/A;  . CARDIOVERSION N/A 10/24/2018   Procedure:  CARDIOVERSION;  Surgeon: Thayer Headings, MD;  Location: [facility] ENDOSCOPY;  Service: Cardiovascular;  Laterality: N/A;  . HERNIA REPAIR  06/2017    There were no vitals filed for this visit.  Subjective Assessment - 01/23/19 1601    Subjective  Pt. reports his shoulder feels like it is getting better. Still with stiffness but getting easier to reach up with right hand.    Pertinent History  pacemaker/defibrillator, diabetic, CHF, CKD    Diagnostic tests  X-rays    Patient Stated Goals  Improve shoulder motion    Currently in Pain?  Yes    Pain Score  4     Pain Location  Shoulder    Pain Orientation  Right    Pain Descriptors / Indicators  Aching    Pain Type  Acute pain    Pain Onset  More than a month ago    Pain Frequency  Intermittent    Aggravating Factors   activity    Pain Relieving Factors  rest, medication    Effect of Pain on Daily Activities  limits ability for reaching ADLs         [facility] PT Assessment - 01/23/19 0001      AROM   Right Shoulder Flexion  60 Degrees    Right Shoulder ABduction  80 Degrees  [facility] Adult PT Treatment/Exercise - 01/23/19 0001      Shoulder Exercises: Supine   Other Supine Exercises  supine wand flexion stretch x 15 reps cues for elbow extension      Shoulder Exercises: Pulleys   Flexion  2 minutes    Scaption  2 minutes      Shoulder Exercises: Stretch   Other Shoulder Stretches  sleeper stretch 20 sec x 3      Manual Therapy   Joint Mobilization  AP and caudal right GH mobilization grade I-III    Soft tissue mobilization  STM right posterior shoulder and pec release    Passive ROM  Right shoulder 4-way             PT Education - 01/23/19 1616    Education Details  exercises, progress    Person(s) Educated  Patient    Methods  Explanation;Tactile cues;Verbal cues;Demonstration    Comprehension  Verbalized understanding;Verbal cues required;Returned demonstration       PT Short Term Goals  - 01/04/19 1717      PT SHORT TERM GOAL #1   Title  Independent with HEP    Baseline  needs HEP    Time  3    Period  Weeks    Status  New    Target Date  01/25/19      PT SHORT TERM GOAL #2   Title  Improve right shoulder AROM for flexion and abduction at least 10-20 deg each to work towards improved ability to use RUE for dressing and bathing    Baseline  flexion 45 deg, abduction 60 deg    Time  3    Period  Weeks    Status  New        PT Long Term Goals - 01/23/19 1618      PT LONG TERM GOAL #1   Title  Improve FOTO outcome measure score to 34% or less impairment    Baseline  48% limited    Time  6    Period  Weeks    Status  On-going      PT LONG TERM GOAL #2   Title  Right shoulder AROM grossly WFL to dress and bathe with RUE use    Baseline  see objective-improvement from baseline but still limited    Time  6    Period  Weeks    Status  On-going      PT LONG TERM GOAL #3   Title  Right shoulder strength grossly 5/5 to improve ability to carry grocery bags and perform work duties with son    Baseline  MMTs not retested today    Time  6    Period  Weeks    Status  On-going      PT LONG TERM GOAL #4   Title  Perform ADLs/IADLs as needed with right shoulder pain <2/10    Baseline  4/10    Time  6    Period  Weeks    Status  On-going            Plan - 01/23/19 1617    Clinical Impression Statement  Still with capsular restriction of ROM/associated limitations but ROM gradually improving from baseline status with subsequent functional gains for ability for reaching ADLs.    Personal Factors and Comorbidities  Age;Comorbidity 3+    Comorbidities  diabetic, CHF/cardiac history, CKD    Examination-Activity Limitations  Bathing;Dressing;Hygiene/Grooming;Sleep;Lift;Carry;Reach Overhead    Examination-Participation Restrictions  Yard Work;Laundry;Cleaning;Community Activity;Meal Prep;Driving    Stability/Clinical Decision Making  Stable/Uncomplicated     Clinical Decision Making  Low    Rehab Potential  Good    PT Frequency  2x / week    PT Duration  6 weeks    PT Treatment/Interventions  ADLs/Self Care Home Management;Cryotherapy;Moist Heat;Therapeutic exercise;Therapeutic activities;Neuromuscular re-education;Manual techniques;Dry needling;Passive range of motion;Taping    PT Next Visit Plan  No estim-pacemaker/defibrillator, right shoulder ROM focus with stretches, PROM, gentle manual    PT Home Exercise Plan  pendulums, "saw" row, table slide flexion stretch, wand ER stretch, sleeper stretch for IR    Consulted and Agree with Plan of Care  Patient       Patient will benefit from skilled therapeutic intervention in order to improve the following deficits and impairments:  Pain, Impaired flexibility, Decreased strength, Decreased range of motion, Impaired UE functional use, Hypomobility  Visit Diagnosis: Right shoulder pain, unspecified chronicity  Stiffness of right shoulder, not elsewhere classified  Muscle weakness (generalized)     Problem List Patient Active Problem List   Diagnosis Date Noted  . Frozen shoulder syndrome 01/03/2019  . 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 Other persistent atrial fibrillation ([place])   . Atrial flutter ([place]) 02/10/2018  . Long term (current) use of anticoagulants 02/10/2018  . Nonischemic cardiomyopathy ([place]) 01/26/2018  . Hypokalemia 01/01/2016  . CKD (chronic kidney disease), stage III 01/01/2016  . Chronic systolic heart failure ([place]) 05/06/2015  . Secondary pulmonary hypertension 01/31/2015  . Aortic valve calcification   . Aortic valve regurgitation   . Aortic root dilation (HCC)   . Mild diastolic dysfunction   . Gilbert's syndrome   . Thrombocytopenia ([place])   . Acute on chronic systolic CHF (congestive heart failure) ([place]) 12/20/2014  . LAFB (left anterior fascicular block) 12/13/2014  . Aortic regurgitation 03/05/2014  . Dilated  aortic root ([place]) 03/05/2014  . Essential hypertension 03/05/2014  . Right bundle branch block 03/05/2014  . Bifascicular block 03/05/2014  . Hyperlipidemia 03/05/2014    Beaulah Dinning, PT, DPT 01/23/19 4:26 PM  [facility] [address] [place], [place], [address] Phone: [phone]   Fax:  [phone]  Name: DESMAN BALLARD MRN: [id number] Date of Birth: 08/19/1940

## 2019-01-24 ENCOUNTER — Encounter: Payer: Self-pay | Admitting: Cardiology

## 2019-01-24 ENCOUNTER — Encounter (INDEPENDENT_AMBULATORY_CARE_PROVIDER_SITE_OTHER): Payer: Self-pay

## 2019-01-24 ENCOUNTER — Ambulatory Visit (INDEPENDENT_AMBULATORY_CARE_PROVIDER_SITE_OTHER): Payer: Medicare Other | Admitting: Cardiology

## 2019-01-24 VITALS — BP 100/58 | HR 75 | Ht 71.0 in | Wt 172.0 lb

## 2019-01-24 DIAGNOSIS — I428 Other cardiomyopathies: Secondary | ICD-10-CM | POA: Diagnosis not present

## 2019-01-24 MED ORDER — AMIODARONE HCL 200 MG PO TABS: 200.0000 mg | ORAL_TABLET | Freq: Every day | ORAL | 3 refills | Status: DC

## 2019-01-24 NOTE — Progress Notes (Signed)
Electrophysiology Office Note   Date:  01/24/2019   ID:  Kemp, [place] Jan 28, 1941, MRN [id number]  PCP:  [name], MD  Cardiologist:  Marlou Porch Primary Electrophysiologist:  Gwenetta Devos Meredith Leeds, MD    No chief complaint on file.    History of Present Illness: Carlos Gibson is a 78 y.o. male who presents today for electrophysiology evaluation.   Hx NICM/chronic systolic CHF (LHC [id number] with normal cors &EF 25-30%; last echo 05/2015 with EF 35-40%), intolerance to prior Toprol/carvedilol due to "feeling bad," secondary pulmonary hypertension, frequent PVCs, aortic root dilatation, Trifasicular block (RBBB + LAFB + 1st degree AVB), diabetes, Gilbert's syndrome, mild AI/MR by echo 05/2015, hyperlipidemia, thrombocytopenia, probable CKD stage III.  He was put on Entresto.  His ejection fraction has since improved to 40-45%.  He is now status post Medtronic CRT-D implanted 01/26/2018.  His ejection fraction prior to was 25 to 30%.  Today, denies symptoms of palpitations, chest pain, shortness of breath, orthopnea, PND, lower extremity edema, claudication, dizziness, presyncope, syncope, bleeding, or neurologic sequela. The patient is tolerating medications without difficulties.  He is overall doing well.  He remains in sinus rhythm since his cardioversion.  Device interrogation shows a decrease in CRT pacing, thought due to an elevated PVC burden.   Past Medical History:  Diagnosis Date  . Aortic regurgitation    a. mild AI by echo 01/2016.  [name] Aortic root dilation (HCC)   . Aortic valve regurgitation   . Bifascicular block   . Chronic systolic CHF (congestive heart failure) ([place])   . CKD (chronic kidney disease), stage III 01/01/2016  . Diabetes ([place])   . Ejection fraction < 50%   . Frequent PVCs   . Gilbert's syndrome   . Hyperlipidemia   . Mild diastolic dysfunction   . Mitral regurgitation    a. mod by echo 10/207.  [name] NICM (nonischemic cardiomyopathy) ([place])   . Pericardial  effusion    a. small by echo 01/2016.  . Pulmonary hypertension ([place])   . Right BBB/left post fasc block   . Thrombocytopenia ([place])    Past Surgical History:  Procedure Laterality Date  . BIV ICD INSERTION CRT-D N/A 01/26/2018   Procedure: BIV ICD INSERTION CRT-D;  Surgeon: Constance Haw, MD;  Location: [place] CV LAB;  Service: Cardiovascular;  Laterality: N/A;  . CARDIAC CATHETERIZATION N/A 12/20/2014   Procedure: Right/Left Heart Cath and Coronary Angiography;  Surgeon: Jerline Pain, MD;  Location: [place] CV LAB;  Service: Cardiovascular;  Laterality: N/A;  . CARDIOVERSION N/A 10/24/2018   Procedure: CARDIOVERSION;  Surgeon: Thayer Headings, MD;  Location: [facility] ENDOSCOPY;  Service: Cardiovascular;  Laterality: N/A;  . HERNIA REPAIR  06/2017     Current Outpatient Medications  Medication Sig Dispense Refill  . acetaminophen (TYLENOL) 650 MG CR tablet Take 650 mg by mouth every 8 (eight) hours as needed for pain.    [name] alfuzosin (UROXATRAL) 10 MG 24 hr tablet Take 10 mg by mouth at bedtime.   3  . atorvastatin (LIPITOR) 10 MG tablet Take 10 mg by mouth at bedtime.    . bisoprolol (ZEBETA) 5 MG tablet TAKE 1 TABLET BY MOUTH EVERY DAY 90 tablet 2  . Cholecalciferol (VITAMIN D3) 25 MCG (1000 UT) CAPS Take 1,000 Units by mouth every other day.     . finasteride (PROSCAR) 5 MG tablet Take 5 mg by mouth daily.   2  . furosemide (LASIX) 40 MG tablet Take  40 mg by mouth as needed for fluid or edema.    [name] glimepiride (AMARYL) 2 MG tablet Take 2 mg by mouth at bedtime.     [name] losartan (COZAAR) 25 MG tablet TAKE 1 TABLET(25 MG) BY MOUTH DAILY 90 tablet 1  . potassium chloride (KLOR-CON) 10 MEQ tablet Take 10 mEq by mouth daily.    [name] spironolactone (ALDACTONE) 25 MG tablet TAKE 1/2 TABLET(12.5 MG) BY MOUTH DAILY 45 tablet 3  . traMADol (ULTRAM) 50 MG tablet Take by mouth every 6 (six) hours as needed.    . warfarin (COUMADIN) 5 MG tablet TAKE AS DIRECTED BY COUMADIN CLINIC 90 tablet  0   No current facility-administered medications for this visit.     Allergies:   Ace inhibitors, Coreg [carvedilol], and Toprol xl [metoprolol tartrate]   Social History:  The patient  reports that he has never smoked. He has never used smokeless tobacco. He reports that he does not drink alcohol or use drugs.   Family History:  The patient's family history includes Hypertension in his mother; Irregular heart beat in his father; Stroke in his mother; Unexplained death in his father.   ROS:  Please see the history of present illness.   Otherwise, review of systems is positive for none.   All other systems are reviewed and negative.   PHYSICAL EXAM: VS:  BP (!) 100/58   Pulse 75   Ht 5\' 11"  (1.803 m)   Wt 172 lb (78 kg)   SpO2 95%   BMI 23.99 kg/m  , BMI Body mass index is 23.99 kg/m. GEN: Well nourished, well developed, in no acute distress  HEENT: normal  Neck: no JVD, carotid bruits, or masses Cardiac: RRR; no murmurs, rubs, or gallops,no edema  Respiratory:  clear to auscultation bilaterally, normal work of breathing GI: soft, nontender, nondistended, + BS MS: no deformity or atrophy  Skin: warm and dry, device site well healed Neuro:  Strength and sensation are intact Psych: euthymic mood, full affect  EKG:  EKG is ordered today. Personal review of the ekg ordered shows sinus rhythm, V paced, PVCs  Personal review of the device interrogation today. Results in [place]: 10/14/2018: Hemoglobin 12.2; Platelets 129 10/24/2018: BUN 24; Creatinine, Ser 1.71; Potassium 4.3; Sodium 138    Lipid Panel  No results found for: CHOL, TRIG, HDL, CHOLHDL, VLDL, LDLCALC, LDLDIRECT   Wt Readings from Last 3 Encounters:  01/24/19 172 lb (78 kg)  01/13/19 175 lb (79.4 kg)  01/03/19 175 lb (79.4 kg)      Other studies Reviewed: Additional studies/ records that were reviewed today include: CMRI  Review of the above records today demonstrates:  1. Severely dilated  left ventricle with normal wall thickness and severely reduced systolic function (LVEF = 34%). There is diffuse hypokinesis.  There is no late gadolinium enhancement in the left ventricular myocardium.  2. Mildly dilated right ventricular size with normal wall thickness and moderately decreased systolic function (LVEF = 32%). There are no regional wall motion abnormalities.  3.  Severe bi-atrial dilatation.  No left atrial thrombus.  4. Normal size of the aortic root, ascending aorta. Dilated pulmonary artery measuring 34 mm.  5. Moderate mitral regurgitation with posteriorly directed jet. Mild to moderate tricuspid regurgitation.  6.  Normal pericardium.  Trivial pericardial effusion.  ASSESSMENT AND PLAN:  1.  Nonischemic cardiomyopathy: Currently on losartan and bisoprolol.  Status post Medtronic CRT-D.  Device functioning appropriately.  His QRS is  wide and is positive in lead I and aVL.  We have adjusted the LV RV timing to put LV first.  2. PVCs: Appears that he has the decreased BiV pacing burden.  Due to that, we Melia Hopes plan to start amiodarone.  3. Hypertension: Currently well controlled  4.  Persistent atrial fibrillation/atrial flutter: He is now status post cardioversion.  Currently on warfarin. Remains in sinus rhythm.  Starting amiodarone  This patients CHA2DS2-VASc Score and unadjusted Ischemic Stroke Rate (% per year) is equal to 4.8 % stroke rate/year from a score of 4  Above score calculated as 1 point each if present [CHF, HTN, DM, Vascular=MI/PAD/Aortic Plaque, Age if 65-74, or Male] Above score calculated as 2 points each if present [Age > 75, or Stroke/TIA/TE]    Current medicines are reviewed at length with the patient today.   The patient does not have concerns regarding his medicines.  The following changes were made today: Start amiodarone  Labs/ tests ordered today include:  Orders Placed This Encounter  Procedures  . EKG 12-Lead     Disposition:   FU with Arlander Gillen 12 months  Signed, Nadiya Pieratt Meredith Leeds, MD  01/24/2019 2:52 PM     [facility] [address] [place] [address] [phone] (office) [phone] (fax)

## 2019-01-24 NOTE — Addendum Note (Signed)
Addended by: Stanton Kidney on: 01/24/2019 03:10 PM   Modules accepted: Orders

## 2019-01-24 NOTE — Patient Instructions (Addendum)
Medication Instructions:  Your physician has recommended you make the following change in your medication:  1. START Amiodarone 200 mg ONCE daily  *If you need a refill on your cardiac medications before your next appointment, please call your pharmacy*  Labwork: None ordered  Testing/Procedures: None ordered  Follow-Up: Remote monitoring is used to monitor your Pacemaker or ICD from home. This monitoring reduces the number of office visits required to check your device to one time per year. It allows [place] to keep an eye on the functioning of your device to ensure it is working properly. You are scheduled for a device check from home on 04/04/19. You may send your transmission at any time that day. If you have a wireless device, the transmission will be sent automatically. After your physician reviews your transmission, you will receive a postcard with your next transmission date.  At [facility], you and your health needs are our priority.  As part of our continuing mission to provide you with exceptional heart care, we have created designated Provider Care Teams.  These Care Teams include your primary Cardiologist (physician) and Advanced Practice Providers (APPs -  Physician Assistants and Nurse Practitioners) who all work together to provide you with the care you need, when you need it.  You will need a follow up appointment in 3 months.  Please call our office 2 months in advance to schedule this appointment.  You may see Dr Curt Bears or one of the following Advanced Practice Providers on your designated Care Team:    Chanetta Marshall, NP  Tommye Standard, PA-C  Oda Kilts, [place]  Thank you for choosing [facility]!!   Trinidad Curet, RN [phone]  Any Other Special Instructions Will Be Listed Below (If Applicable).  Amiodarone tablets What is this medicine? AMIODARONE (a MEE oh da rone) is an antiarrhythmic drug. It helps make your heart beat regularly. Because of the side  effects caused by this medicine, it is only used when other medicines have not worked. It is usually used for heartbeat problems that may be life threatening. This medicine may be used for other purposes; ask your health care provider or pharmacist if you have questions. COMMON BRAND NAME(S): Cordarone, Pacerone What should I tell my health care provider before I take this medicine? They need to know if you have any of these conditions:  liver disease  lung disease  other heart problems  thyroid disease  an unusual or allergic reaction to amiodarone, iodine, other medicines, foods, dyes, or preservatives  pregnant or trying to get pregnant  breast-feeding How should I use this medicine? Take this medicine by mouth with a glass of water. Follow the directions on the prescription label. You can take this medicine with or without food. However, you should always take it the same way each time. Take your doses at regular intervals. Do not take your medicine more often than directed. Do not stop taking except on the advice of your doctor or health care professional. A special MedGuide will be given to you by the pharmacist with each prescription and refill. Be sure to read this information carefully each time. Talk to your pediatrician regarding the use of this medicine in children. Special care may be needed. Overdosage: If you think you have taken too much of this medicine contact a poison control center or emergency room at once. NOTE: This medicine is only for you. Do not share this medicine with others. What if I miss a dose? If  you miss a dose, take it as soon as you can. If it is almost time for your next dose, take only that dose. Do not take double or extra doses. What may interact with this medicine? Do not take this medicine with any of the following medications:  abarelix  apomorphine  arsenic trioxide  certain antibiotics like erythromycin, gemifloxacin, levofloxacin,  pentamidine  certain medicines for depression like amoxapine, tricyclic antidepressants  certain medicines for fungal infections like fluconazole, itraconazole, ketoconazole, posaconazole, voriconazole  certain medicines for irregular heart beat like disopyramide, dronedarone, ibutilide, propafenone, sotalol  certain medicines for malaria like chloroquine, halofantrine  cisapride  droperidol  haloperidol  haw[place]orn  maprotiline  methadone  phenothiazines like chlorpromazine, mesoridazine, thioridazine  pimozide  ranolazine  red yeast rice  vardenafil This medicine may also interact with the following medications:  antiviral medicines for HIV or AIDS  certain medicines for blood pressure, heart disease, irregular heart beat  certain medicines for cholesterol like atorvastatin, cerivastatin, lovastatin, simvastatin  certain medicines for hepatitis C like sofosbuvir and ledipasvir; sofosbuvir  certain medicines for seizures like phenytoin  certain medicines for thyroid problems  certain medicines that treat or prevent blood clots like warfarin  cholestyramine  cimetidine  clopidogrel  cyclosporine  dextromethorphan  diuretics  dofetilide  fentanyl  general anesthetics  grapefruit juice  lidocaine  loratadine  methotrexate  other medicines that prolong the QT interval (cause an abnormal heart rhythm)  procainamide  quinidine  rifabutin, rifampin, or rifapentine  St. John's Wort  trazodone  ziprasidone This list may not describe all possible interactions. Give your health care provider a list of all the medicines, herbs, non-prescription drugs, or dietary supplements you use. Also tell them if you smoke, drink alcohol, or use illegal drugs. Some items may interact with your medicine. What should I watch for while using this medicine? Your condition will be monitored closely when you first begin therapy. Often, this drug is first  started in a hospital or other monitored health care setting. Once you are on maintenance therapy, visit your doctor or health care professional for regular checks on your progress. Because your condition and use of this medicine carry some risk, it is a good idea to carry an identification card, necklace or bracelet with details of your condition, medications, and doctor or health care professional. Dennis Bast may get drowsy or dizzy. Do not drive, use machinery, or do anything that needs mental alertness until you know how this medicine affects you. Do not stand or sit up quickly, especially if you are an older patient. This reduces the risk of dizzy or fainting spells. This medicine can make you more sensitive to the sun. Keep out of the sun. If you cannot avoid being in the sun, wear protective clothing and use sunscreen. Do not use sun lamps or tanning beds/booths. You should have regular eye exams before and during treatment. Call your doctor if you have blurred vision, see halos, or your eyes become sensitive to light. Your eyes may get dry. It may be helpful to use a lubricating eye solution or artificial tears solution. If you are going to have surgery or a procedure that requires contrast dyes, tell your doctor or health care professional that you are taking this medicine. What side effects may I notice from receiving this medicine? Side effects that you should report to your doctor or health care professional as soon as possible:  allergic reactions like skin rash, itching or hives, swelling of the face,  lips, or tongue  blue-gray coloring of the skin  blurred vision, seeing blue green halos, increased sensitivity of the eyes to light  breathing problems  chest pain  dark urine  fast, irregular heartbeat  feeling faint or light-headed  intolerance to heat or cold  nausea or vomiting  pain and swelling of the scrotum  pain, tingling, numbness in feet, hands  redness, blistering,  peeling or loosening of the skin, including inside the mouth  spitting up blood  stomach pain  sweating  unusual or uncontrolled movements of body  unusually weak or tired  weight gain or loss  yellowing of the eyes or skin Side effects that usually do not require medical attention (report to your doctor or health care professional if they continue or are bothersome):  change in sex drive or performance  constipation  dizziness  headache  loss of appetite  trouble sleeping This list may not describe all possible side effects. Call your doctor for medical advice about side effects. You may report side effects to FDA at 1-800-FDA-1088. Where should I keep my medicine? Keep out of the reach of children. Store at room temperature between 20 and 25 degrees C (68 and 77 degrees F). Protect from light. Keep container tightly closed. Throw away any unused medicine after the expiration date. NOTE: This sheet is a summary. It may not cover all possible information. If you have questions about this medicine, talk to your doctor, pharmacist, or health care provider.  2020 Elsevier/Gold Standard (2018-02-23 13:44:04)

## 2019-01-25 ENCOUNTER — Telehealth: Payer: Self-pay | Admitting: Physical Therapy

## 2019-01-25 ENCOUNTER — Ambulatory Visit: Payer: Medicare Other | Admitting: Physical Therapy

## 2019-01-25 NOTE — Telephone Encounter (Signed)
Attempted to call patient regarding no show for 3 pm appointment-left voicemail including reminder next appointment time.

## 2019-01-30 ENCOUNTER — Ambulatory Visit: Payer: Medicare Other | Admitting: Physical Therapy

## 2019-01-30 ENCOUNTER — Other Ambulatory Visit: Payer: Self-pay

## 2019-01-30 ENCOUNTER — Encounter: Payer: Self-pay | Admitting: Physical Therapy

## 2019-01-30 DIAGNOSIS — M6281 Muscle weakness (generalized): Secondary | ICD-10-CM | POA: Diagnosis not present

## 2019-01-30 DIAGNOSIS — M25511 Pain in right shoulder: Secondary | ICD-10-CM | POA: Diagnosis not present

## 2019-01-30 DIAGNOSIS — M25611 Stiffness of right shoulder, not elsewhere classified: Secondary | ICD-10-CM

## 2019-01-30 NOTE — Therapy (Signed)
[place], [place], [address] Phone: [phone]   Fax:  [phone]  Physical Therapy Treatment  Patient Details  Name: Carlos Gibson MRN: [id number] Date of Birth: 04/18/1940 Referring Provider (PT): Joni Fears, MD   Encounter Date: 01/30/2019  PT End of Session - 01/30/19 1616    Visit Number  5    Number of Visits  12    Date for PT Re-Evaluation  02/15/19    Authorization Type  [employer]-progress note by visit 10, KX at 18    PT Start Time  [id number]    PT Stop Time  1630    PT Time Calculation (min)  38 min    Activity Tolerance  Patient tolerated treatment well    Behavior During Therapy  [facility] for tasks assessed/performed       Past Medical History:  Diagnosis Date  . Aortic regurgitation    a. mild AI by echo 01/2016.  [name] Aortic root dilation (HCC)   . Aortic valve regurgitation   . Bifascicular block   . Chronic systolic CHF (congestive heart failure) ([place])   . CKD (chronic kidney disease), stage III 01/01/2016  . Diabetes ([place])   . Ejection fraction < 50%   . Frequent PVCs   . Gilbert's syndrome   . Hyperlipidemia   . Mild diastolic dysfunction   . Mitral regurgitation    a. mod by echo 10/207.  [name] NICM (nonischemic cardiomyopathy) ([place])   . Pericardial effusion    a. small by echo 01/2016.  . Pulmonary hypertension ([place])   . Right BBB/left post fasc block   . Thrombocytopenia ([place])     Past Surgical History:  Procedure Laterality Date  . BIV ICD INSERTION CRT-D N/A 01/26/2018   Procedure: BIV ICD INSERTION CRT-D;  Surgeon: Constance Haw, MD;  Location: [facility] CV LAB;  Service: Cardiovascular;  Laterality: N/A;  . CARDIAC CATHETERIZATION N/A 12/20/2014   Procedure: Right/Left Heart Cath and Coronary Angiography;  Surgeon: Jerline Pain, MD;  Location: [place] CV LAB;  Service: Cardiovascular;  Laterality: N/A;  . CARDIOVERSION N/A 10/24/2018   Procedure:  CARDIOVERSION;  Surgeon: Thayer Headings, MD;  Location[facility] ENDOSCOPY;  Service: Cardiovascular;  Laterality: N/A;  . HERNIA REPAIR  06/2017    There were no vitals filed for this visit.  Subjective Assessment - 01/30/19 1553    Subjective  Pt. reports shoulder improving with more motion though still noting some stiffness and pain.    Pertinent History  pacemaker/defibrillator, diabetic, CHF, CKD    Currently in Pain?  Yes    Pain Score  6     Pain Location  Shoulder    Pain Orientation  Right    Pain Descriptors / Indicators  Dull    Pain Type  Acute pain    Pain Onset  More than a month ago    Pain Frequency  Intermittent    Aggravating Factors   activity, reaching    Pain Relieving Factors  rest, medication    Effect of Pain on Daily Activities  limits tolerance and ability for reacing ADLs                       [facility] Adult PT Treatment/Exercise - 01/30/19 0001      Shoulder Exercises: Supine   Other Supine Exercises  supine wand ER stretch-cues for hand position and angle of ROM, wand flexion x 15 reps  cues to keep elbows extended and for hand distance apart on wand      Shoulder Exercises: Pulleys   Flexion  2 minutes   cues for hand position and angle of ROM   Scaption  2 minutes   cues for hand position and angle of ROM     Shoulder Exercises: Stretch   Other Shoulder Stretches  cross body horizontal adduction stretch in sitting 20 sec x 3 cues for hand position and angle ROM      Manual Therapy   Joint Mobilization  AP and caudal right GH mobilization grade I-III    Soft tissue mobilization  STM right posterior shoulder and subscapularis release    Passive ROM  Right shoulder 4-way             PT Education - 01/30/19 1616    Education Details  POC, HEP, exercises    Person(s) Educated  Patient    Methods  Explanation;Demonstration;Tactile cues;Verbal cues    Comprehension  Verbalized understanding;Returned demonstration       PT Short  Term Goals - 01/04/19 1717      PT SHORT TERM GOAL #1   Title  Independent with HEP    Baseline  needs HEP    Time  3    Period  Weeks    Status  New    Target Date  01/25/19      PT SHORT TERM GOAL #2   Title  Improve right shoulder AROM for flexion and abduction at least 10-20 deg each to work towards improved ability to use RUE for dressing and bathing    Baseline  flexion 45 deg, abduction 60 deg    Time  3    Period  Weeks    Status  New        PT Long Term Goals - 01/23/19 1618      PT LONG TERM GOAL #1   Title  Improve FOTO outcome measure score to 34% or less impairment    Baseline  48% limited    Time  6    Period  Weeks    Status  On-going      PT LONG TERM GOAL #2   Title  Right shoulder AROM grossly WFL to dress and bathe with RUE use    Baseline  see objective-improvement from baseline but still limited    Time  6    Period  Weeks    Status  On-going      PT LONG TERM GOAL #3   Title  Right shoulder strength grossly 5/5 to improve ability to carry grocery bags and perform work duties with son    Baseline  MMTs not retested today    Time  6    Period  Weeks    Status  On-going      PT LONG TERM GOAL #4   Title  Perform ADLs/IADLs as needed with right shoulder pain <2/10    Baseline  4/10    Time  6    Period  Weeks    Status  On-going            Plan - 01/30/19 1616    Clinical Impression Statement  Mr. Grube continues to make gradual progress with improving his right shoulder ROM and reaching ability. Still too tight in IR for towel/behind back stretch so continue to emphasize sleeper stretch for HEP and added cross body horizontal adduction stretch to help address posterior capsule tightness. Cues  for form with exercises as noted per flowsheet    Personal Factors and Comorbidities  Age;Comorbidity 3+    Comorbidities  diabetic, CHF/cardiac history, CKD    Examination-Activity Limitations   Bathing;Dressing;Hygiene/Grooming;Sleep;Lift;Carry;Reach Overhead    Examination-Participation Restrictions  Yard Work;Laundry;Cleaning;Community Activity;Meal Prep;Driving    Stability/Clinical Decision Making  Stable/Uncomplicated    Clinical Decision Making  Low    Rehab Potential  Good    PT Frequency  2x / week    PT Duration  6 weeks    PT Treatment/Interventions  ADLs/Self Care Home Management;Cryotherapy;Moist Heat;Therapeutic exercise;Therapeutic activities;Neuromuscular re-education;Manual techniques;Dry needling;Passive range of motion;Taping    PT Next Visit Plan  No estim-pacemaker/defibrillator, right shoulder ROM focus with stretches, PROM, gentle manual    PT Home Exercise Plan  pendulums, "saw" row, table slide flexion stretch, wand ER stretch, sleeper stretch for IR, cross body horiz. adduction stretch    Consulted and Agree with Plan of Care  Patient       Patient will benefit from skilled therapeutic intervention in order to improve the following deficits and impairments:  Pain, Impaired flexibility, Decreased strength, Decreased range of motion, Impaired UE functional use, Hypomobility  Visit Diagnosis: Right shoulder pain, unspecified chronicity  Stiffness of right shoulder, not elsewhere classified  Muscle weakness (generalized)     Problem List Patient Active Problem List   Diagnosis Date Noted  . Frozen shoulder syndrome 01/03/2019  . 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 Other persistent atrial fibrillation ([place])   . Atrial flutter ([place]) 02/10/2018  . Long term (current) use of anticoagulants 02/10/2018  . Nonischemic cardiomyopathy ([place]) 01/26/2018  . Hypokalemia 01/01/2016  . CKD (chronic kidney disease), stage III 01/01/2016  . Chronic systolic heart failure ([place]) 05/06/2015  . Secondary pulmonary hypertension 01/31/2015  . Aortic valve calcification   . Aortic valve regurgitation   . Aortic root  dilation (HCC)   . Mild diastolic dysfunction   . Gilbert's syndrome   . Thrombocytopenia ([place])   . Acute on chronic systolic CHF (congestive heart failure) ([place]) 12/20/2014  . LAFB (left anterior fascicular block) 12/13/2014  . Aortic regurgitation 03/05/2014  . Dilated aortic root ([place]) 03/05/2014  . Essential hypertension 03/05/2014  . Right bundle branch block 03/05/2014  . Bifascicular block 03/05/2014  . Hyperlipidemia 03/05/2014    Beaulah Dinning, PT, DPT 01/30/19 4:31 PM  [facility] [address] [place], [place], [address] Phone: [phone]   Fax:  [phone]  Name: Carlos Gibson MRN: [id number] Date of Birth: 04-Mar-1941

## 2019-02-01 ENCOUNTER — Other Ambulatory Visit: Payer: Self-pay

## 2019-02-01 ENCOUNTER — Ambulatory Visit: Payer: Medicare Other | Admitting: Physical Therapy

## 2019-02-01 ENCOUNTER — Encounter: Payer: Self-pay | Admitting: Physical Therapy

## 2019-02-01 DIAGNOSIS — M25511 Pain in right shoulder: Secondary | ICD-10-CM

## 2019-02-01 DIAGNOSIS — M25611 Stiffness of right shoulder, not elsewhere classified: Secondary | ICD-10-CM | POA: Diagnosis not present

## 2019-02-01 DIAGNOSIS — M6281 Muscle weakness (generalized): Secondary | ICD-10-CM

## 2019-02-01 NOTE — Therapy (Signed)
[place], [place], [address] Phone: [phone]   Fax:  [phone]  Physical Therapy Treatment  Patient Details  Name: Carlos Gibson MRN: [id number] Date of Birth: 1940-08-31 Referring Provider (PT): Joni Fears, MD   Encounter Date: 02/01/2019  PT End of Session - 02/01/19 1634    Visit Number  6    Number of Visits  12    Date for PT Re-Evaluation  02/15/19    Authorization Type  [employer]-progress note by visit 10, KX at 24    PT Start Time  [id number]    PT Stop Time  1627    PT Time Calculation (min)  42 min    Activity Tolerance  Patient tolerated treatment well    Behavior During Therapy  [facility] for tasks assessed/performed       Past Medical History:  Diagnosis Date  . Aortic regurgitation    a. mild AI by echo 01/2016.  [name] Aortic root dilation (HCC)   . Aortic valve regurgitation   . Bifascicular block   . Chronic systolic CHF (congestive heart failure) ([place])   . CKD (chronic kidney disease), stage III 01/01/2016  . Diabetes ([place])   . Ejection fraction < 50%   . Frequent PVCs   . Gilbert's syndrome   . Hyperlipidemia   . Mild diastolic dysfunction   . Mitral regurgitation    a. mod by echo 10/207.  [name] NICM (nonischemic cardiomyopathy) ([place])   . Pericardial effusion    a. small by echo 01/2016.  . Pulmonary hypertension ([place])   . Right BBB/left post fasc block   . Thrombocytopenia ([place])     Past Surgical History:  Procedure Laterality Date  . BIV ICD INSERTION CRT-D N/A 01/26/2018   Procedure: BIV ICD INSERTION CRT-D;  Surgeon: Constance Haw, MD;  Location: [place] CV [facility];  Service: Cardiovascular;  Laterality: N/A;  . CARDIAC CATHETERIZATION N/A 12/20/2014   Procedure: Right/Left Heart Cath and Coronary Angiography;  Surgeon: Jerline Pain, MD;  Location: [place] [facility];  Service: Cardiovascular;  Laterality: N/A;  . CARDIOVERSION N/A 10/24/2018   Procedure:  CARDIOVERSION;  Surgeon: Thayer Headings, MD;  Location: [facility] ENDOSCOPY;  Service: Cardiovascular;  Laterality: N/A;  . HERNIA REPAIR  06/2017    There were no vitals filed for this visit.  Subjective Assessment - 02/01/19 1630    Subjective  Carlos Gibson reports continued improvement with his shoulder motion. Discussed plan and though still with ROM limitations he feels at this point that he can continue his progress independently with HEP-see plan.    Pertinent History  pacemaker/defibrillator, diabetic, CHF, CKD         [facility] PT Assessment - 02/01/19 0001      Observation/Other Assessments   Focus on Therapeutic Outcomes (FOTO)   33% limited      AROM   Right Shoulder Flexion  85 Degrees    Right Shoulder ABduction  80 Degrees    Right Shoulder Internal Rotation  --   reach to sacrum   Right Shoulder External Rotation  50 Degrees   at 0 deg abduction     Strength   Overall Strength Comments  Right shoulder strength grossly 5/5 within available ROM                   [facility] Adult PT Treatment/Exercise - 02/01/19 0001      Shoulder Exercises: Pulleys   Flexion  2  minutes   cues for hand position and angle of ROM   Scaption  2 minutes   cues for hand position and angle of ROM     Shoulder Exercises: Stretch   Other Shoulder Stretches  HEP instruction and practice cross body horizontal adduction stretch, IR stretch behind back with left UE assist progressing to towel stretch, addition of scaption angle for table slide      Manual Therapy   Joint Mobilization  AP and caudal right GH mobilization grade I-III    Soft tissue mobilization  STM right posterior shoulder and subscapularis release    Passive ROM  Right shoulder 4-way             PT Education - 02/01/19 1635    Education Details  HEP updates, POC, progress    Person(s) Educated  Patient    Methods  Explanation;Demonstration;Verbal cues;Handout    Comprehension  Verbalized understanding;Returned  demonstration       PT Short Term Goals - 01/04/19 1717      PT SHORT TERM GOAL #1   Title  Independent with HEP    Baseline  needs HEP    Time  3    Period  Weeks    Status  New    Target Date  01/25/19      PT SHORT TERM GOAL #2   Title  Improve right shoulder AROM for flexion and abduction at least 10-20 deg each to work towards improved ability to use RUE for dressing and bathing    Baseline  flexion 45 deg, abduction 60 deg    Time  3    Period  Weeks    Status  New        PT Long Term Goals - 02/01/19 1633      PT LONG TERM GOAL #1   Title  Improve FOTO outcome measure score to 34% or less impairment    Baseline  33 % limited    Time  6    Period  Weeks    Status  Achieved      PT LONG TERM GOAL #2   Title  Right shoulder AROM grossly WFL to dress and bathe with RUE use    Baseline  see objective-improvement from baseline but still limited    Time  6    Period  Weeks    Status  On-going      PT LONG TERM GOAL #3   Title  Right shoulder strength grossly 5/5 to improve ability to carry grocery bags and perform work duties with son    Baseline  5/5 within available ROM    Time  6    Period  Weeks    Status  Achieved      PT LONG TERM GOAL #4   Title  Perform ADLs/IADLs as needed with right shoulder pain <2/10    Baseline  improving from baseline but pain intermittently higher    Time  6    Period  Weeks    Status  On-going            Plan - 02/01/19 1636    Clinical Impression Statement  Still with capsular restriction of ROM consistent with adhesive capsulitis but Carlos Gibson has made good progress with his therapy visits to date with right shoulder ROM and strength gains with functional gains as evidenced by FOTO score improvements. He is wishing at this point to try continuing on his own with HEP and based  on progress to date this is reasonable/expect can continue independently. Plan trial independent progress with HEP for the next seveal weeks with  return to formal therapy only if needed, otherwise pending progress plan d/c.    Personal Factors and Comorbidities  Age;Comorbidity 3+    Comorbidities  diabetic, CHF/cardiac history, CKD    Examination-Activity Limitations  Bathing;Dressing;Hygiene/Grooming;Sleep;Lift;Carry;Reach Overhead    Examination-Participation Restrictions  Yard Work;Laundry;Cleaning;Community Activity;Meal Prep;Driving    Stability/Clinical Decision Making  Stable/Uncomplicated    Clinical Decision Making  Low    Rehab Potential  Good    PT Frequency  2x / week    PT Duration  6 weeks    PT Treatment/Interventions  ADLs/Self Care Home Management;Cryotherapy;Moist Heat;Therapeutic exercise;Therapeutic activities;Neuromuscular re-education;Manual techniques;Dry needling;Passive range of motion;Taping    PT Next Visit Plan  No estim-pacemaker/defibrillator, right shoulder ROM focus with stretches, PROM, gentle manual    PT Home Exercise Plan  pendulums, "saw" row, table slide flexion and scaption stretch, wand ER stretch, sleeper stretch for IR progressed to towel stretch, cross body horiz. adduction stretch    Consulted and Agree with Plan of Care  Patient       Patient will benefit from skilled therapeutic intervention in order to improve the following deficits and impairments:  Pain, Impaired flexibility, Decreased strength, Decreased range of motion, Impaired UE functional use, Hypomobility  Visit Diagnosis: Right shoulder pain, unspecified chronicity  Stiffness of right shoulder, not elsewhere classified  Muscle weakness (generalized)     Problem List Patient Active Problem List   Diagnosis Date Noted  . Frozen shoulder syndrome 01/03/2019  . 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 Other persistent atrial fibrillation ([place])   . Atrial flutter ([place]) 02/10/2018  . Long term (current) use of anticoagulants 02/10/2018  . Nonischemic cardiomyopathy ([place])  01/26/2018  . Hypokalemia 01/01/2016  . CKD (chronic kidney disease), stage III 01/01/2016  . Chronic systolic heart failure ([place]) 05/06/2015  . Secondary pulmonary hypertension 01/31/2015  . Aortic valve calcification   . Aortic valve regurgitation   . Aortic root dilation (HCC)   . Mild diastolic dysfunction   . Gilbert's syndrome   . Thrombocytopenia ([place])   . Acute on chronic systolic CHF (congestive heart failure) ([place]) 12/20/2014  . LAFB (left anterior fascicular block) 12/13/2014  . Aortic regurgitation 03/05/2014  . Dilated aortic root ([place]) 03/05/2014  . Essential hypertension 03/05/2014  . Right bundle branch block 03/05/2014  . Bifascicular block 03/05/2014  . Hyperlipidemia 03/05/2014    Beaulah Dinning, PT, DPT 02/01/19 4:39 PM  [facility] [address] [place], [place], [address] Phone: [phone]   Fax:  [phone]  Name: Carlos Gibson MRN: [id number] Date of Birth: [birth year]

## 2019-02-09 ENCOUNTER — Ambulatory Visit (INDEPENDENT_AMBULATORY_CARE_PROVIDER_SITE_OTHER): Payer: Medicare Other | Admitting: *Deleted

## 2019-02-09 ENCOUNTER — Other Ambulatory Visit: Payer: Self-pay

## 2019-02-09 DIAGNOSIS — I483 Typical atrial flutter: Secondary | ICD-10-CM | POA: Diagnosis not present

## 2019-02-09 DIAGNOSIS — Z7901 Long term (current) use of anticoagulants: Secondary | ICD-10-CM

## 2019-02-09 LAB — POCT INR: INR: 3.3 — AB (ref 2.0–3.0)

## 2019-02-09 NOTE — Patient Instructions (Signed)
Description   Hold today, then Continue on same dosage 1 tablet daily.  Recheck INR in 3 weeks. Coumadin Clinic [phone]

## 2019-02-27 NOTE — Therapy (Signed)
[place], [place], [address] Phone: [phone]   Fax:  [phone]  Physical Therapy Treatment/Discharge  Patient Details  Name: Carlos Gibson MRN: [id number] Date of Birth: 07/09/1940 Referring Provider (PT): Joni Fears, MD   Encounter Date: 02/01/2019    Past Medical History:  Diagnosis Date  . Aortic regurgitation    a. mild AI by echo 01/2016.  [name] Aortic root dilation (HCC)   . Aortic valve regurgitation   . Bifascicular block   . Chronic systolic CHF (congestive heart failure) ([place])   . CKD (chronic kidney disease), stage III 01/01/2016  . Diabetes ([place])   . Ejection fraction < 50%   . Frequent PVCs   . Gilbert's syndrome   . Hyperlipidemia   . Mild diastolic dysfunction   . Mitral regurgitation    a. mod by echo 10/207.  [name] NICM (nonischemic cardiomyopathy) ([place])   . Pericardial effusion    a. small by echo 01/2016.  . Pulmonary hypertension ([place])   . Right BBB/left post fasc block   . Thrombocytopenia ([place])     Past Surgical History:  Procedure Laterality Date  . BIV ICD INSERTION CRT-D N/A 01/26/2018   Procedure: BIV ICD INSERTION CRT-D;  Surgeon: Constance Haw, MD;  Location: [facility] LAB;  Service: Cardiovascular;  Laterality: N/A;  . CARDIAC CATHETERIZATION N/A 12/20/2014   Procedure: Right/Left Heart Cath and Coronary Angiography;  Surgeon: Jerline Pain, MD;  Location: [facility] LAB;  Service: Cardiovascular;  Laterality: N/A;  . CARDIOVERSION N/A 10/24/2018   Procedure: CARDIOVERSION;  Surgeon: Thayer Headings, MD;  Location: [facility] ENDOSCOPY;  Service: Cardiovascular;  Laterality: N/A;  . HERNIA REPAIR  06/2017    There were no vitals filed for this visit.                              PT Short Term Goals - 01/04/19 1717      PT SHORT TERM GOAL #1   Title  Independent with HEP    Baseline  needs HEP    Time  3    Period   Weeks    Status  New    Target Date  01/25/19      PT SHORT TERM GOAL #2   Title  Improve right shoulder AROM for flexion and abduction at least 10-20 deg each to work towards improved ability to use RUE for dressing and bathing    Baseline  flexion 45 deg, abduction 60 deg    Time  3    Period  Weeks    Status  New        PT Long Term Goals - 02/01/19 1633      PT LONG TERM GOAL #1   Title  Improve FOTO outcome measure score to 34% or less impairment    Baseline  33 % limited    Time  6    Period  Weeks    Status  Achieved      PT LONG TERM GOAL #2   Title  Right shoulder AROM grossly WFL to dress and bathe with RUE use    Baseline  see objective-improvement from baseline but still limited    Time  6    Period  Weeks    Status  On-going      PT LONG TERM GOAL #3   Title  Right shoulder strength grossly  5/5 to improve ability to carry grocery bags and perform work duties with son    Baseline  5/5 within available ROM    Time  6    Period  Weeks    Status  Achieved      PT LONG TERM GOAL #4   Title  Perform ADLs/IADLs as needed with right shoulder pain <2/10    Baseline  improving from baseline but pain intermittently higher    Time  6    Period  Weeks    Status  On-going              Patient will benefit from skilled therapeutic intervention in order to improve the following deficits and impairments:  Pain, Impaired flexibility, Decreased strength, Decreased range of motion, Impaired UE functional use, Hypomobility  Visit Diagnosis: Right shoulder pain, unspecified chronicity  Stiffness of right shoulder, not elsewhere classified  Muscle weakness (generalized)     Problem List Patient Active Problem List   Diagnosis Date Noted  . Frozen shoulder syndrome 01/03/2019  . 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 Other persistent atrial fibrillation ([place])   . Atrial flutter ([place]) 02/10/2018  . Long term  (current) use of anticoagulants 02/10/2018  . Nonischemic cardiomyopathy ([place]) 01/26/2018  . Hypokalemia 01/01/2016  . CKD (chronic kidney disease), stage III 01/01/2016  . Chronic systolic heart failure ([place]) 05/06/2015  . Secondary pulmonary hypertension 01/31/2015  . Aortic valve calcification   . Aortic valve regurgitation   . Aortic root dilation (HCC)   . Mild diastolic dysfunction   . Gilbert's syndrome   . Thrombocytopenia ([place])   . Acute on chronic systolic CHF (congestive heart failure) ([place]) 12/20/2014  . LAFB (left anterior fascicular block) 12/13/2014  . Aortic regurgitation 03/05/2014  . Dilated aortic root ([place]) 03/05/2014  . Essential hypertension 03/05/2014  . Right bundle branch block 03/05/2014  . Bifascicular block 03/05/2014  . Hyperlipidemia 03/05/2014        PHYSICAL THERAPY DISCHARGE SUMMARY  Visits from Start of Care: 6  Current functional level related to goals / functional outcomes: Patient continuing via HEP per his request/feels symptoms manageable with independent stretching program   Remaining deficits: Right shoulder stiffness and weakness   Education / Equipment: HEP Plan: Patient agrees to discharge.  Patient goals were partially met. Patient is being discharged due to the patient's request.  ?????        Beaulah Dinning, PT, DPT 02/27/19 8:55 AM     [facility] [address] [place], [place], [address] Phone: [phone]   Fax:  [phone]  Name: ARGELIO GRANIER MRN: [id number] Date of Birth: 1941/02/22

## 2019-03-14 ENCOUNTER — Other Ambulatory Visit: Payer: Self-pay | Admitting: Cardiology

## 2019-03-14 NOTE — Telephone Encounter (Signed)
Called pt overdue for follow-up, missed appt on 02/27/19.  LMOM TCB for appt.

## 2019-03-17 ENCOUNTER — Ambulatory Visit (INDEPENDENT_AMBULATORY_CARE_PROVIDER_SITE_OTHER): Payer: Medicare Other | Admitting: *Deleted

## 2019-03-17 ENCOUNTER — Other Ambulatory Visit: Payer: Self-pay

## 2019-03-17 DIAGNOSIS — Z5181 Encounter for therapeutic drug level monitoring: Secondary | ICD-10-CM

## 2019-03-17 DIAGNOSIS — Z7901 Long term (current) use of anticoagulants: Secondary | ICD-10-CM

## 2019-03-17 DIAGNOSIS — I483 Typical atrial flutter: Secondary | ICD-10-CM

## 2019-03-17 LAB — POCT INR: INR: 4.1 — AB (ref 2.0–3.0)

## 2019-03-17 NOTE — Patient Instructions (Signed)
Description   Hold today and then start taking 1 tablet daily except for 1/2 a tablet on Saturdays.  Recheck INR in 2 weeks. Coumadin Clinic [phone]

## 2019-03-20 ENCOUNTER — Telehealth: Payer: Self-pay

## 2019-03-20 ENCOUNTER — Other Ambulatory Visit: Payer: Self-pay | Admitting: Cardiology

## 2019-03-20 NOTE — Telephone Encounter (Signed)
Spoke with pharmacy and they have the refill that was sent today and are aware that it is a 30 day supply.

## 2019-03-20 NOTE — Telephone Encounter (Signed)
Refill received from Pharmacy, refill sent. Please refer to Warfarin refill encounter for details. Thanks.

## 2019-03-20 NOTE — Telephone Encounter (Signed)
Pt requesting refill for WARFARIN.

## 2019-04-01 ENCOUNTER — Other Ambulatory Visit: Payer: Self-pay | Admitting: Cardiology

## 2019-04-04 ENCOUNTER — Ambulatory Visit (INDEPENDENT_AMBULATORY_CARE_PROVIDER_SITE_OTHER): Payer: Medicare Other | Admitting: *Deleted

## 2019-04-04 DIAGNOSIS — I428 Other cardiomyopathies: Secondary | ICD-10-CM

## 2019-04-05 ENCOUNTER — Telehealth: Payer: Self-pay | Admitting: Student

## 2019-04-05 DIAGNOSIS — I483 Typical atrial flutter: Secondary | ICD-10-CM

## 2019-04-05 DIAGNOSIS — N400 Enlarged prostate without lower urinary tract symptoms: Secondary | ICD-10-CM | POA: Diagnosis not present

## 2019-04-05 DIAGNOSIS — I10 Essential (primary) hypertension: Secondary | ICD-10-CM | POA: Diagnosis not present

## 2019-04-05 DIAGNOSIS — I472 Ventricular tachycardia, unspecified: Secondary | ICD-10-CM

## 2019-04-05 DIAGNOSIS — E1169 Type 2 diabetes mellitus with other specified complication: Secondary | ICD-10-CM | POA: Diagnosis not present

## 2019-04-05 DIAGNOSIS — I5020 Unspecified systolic (congestive) heart failure: Secondary | ICD-10-CM | POA: Diagnosis not present

## 2019-04-05 DIAGNOSIS — E7800 Pure hypercholesterolemia, unspecified: Secondary | ICD-10-CM | POA: Diagnosis not present

## 2019-04-05 LAB — CUP PACEART REMOTE DEVICE CHECK
Battery Remaining Longevity: 72 mo
Battery Voltage: 2.98 V
Brady Statistic AP VP Percent: 65.7 %
Brady Statistic AP VS Percent: 0.74 %
Brady Statistic AS VP Percent: 32.26 %
Brady Statistic AS VS Percent: 1.3 %
Brady Statistic RA Percent Paced: 61.21 %
Brady Statistic RV Percent Paced: 92.15 %
Date Time Interrogation Session: 20201229044224
HighPow Impedance: 63 Ohm
Implantable Lead Implant Date: 20191023
Implantable Lead Implant Date: 20191023
Implantable Lead Implant Date: 20191023
Implantable Lead Location: 753858
Implantable Lead Location: 753859
Implantable Lead Location: 753860
Implantable Lead Model: 4598
Implantable Lead Model: 5076
Implantable Lead Model: 6935
Implantable Pulse Generator Implant Date: 20191023
Lead Channel Impedance Value: 194.634
Lead Channel Impedance Value: 194.634
Lead Channel Impedance Value: 194.634
Lead Channel Impedance Value: 199.5 Ohm
Lead Channel Impedance Value: 199.5 Ohm
Lead Channel Impedance Value: 323 Ohm
Lead Channel Impedance Value: 380 Ohm
Lead Channel Impedance Value: 380 Ohm
Lead Channel Impedance Value: 399 Ohm
Lead Channel Impedance Value: 399 Ohm
Lead Channel Impedance Value: 399 Ohm
Lead Channel Impedance Value: 399 Ohm
Lead Channel Impedance Value: 532 Ohm
Lead Channel Impedance Value: 646 Ohm
Lead Channel Impedance Value: 665 Ohm
Lead Channel Impedance Value: 665 Ohm
Lead Channel Impedance Value: 665 Ohm
Lead Channel Impedance Value: 703 Ohm
Lead Channel Pacing Threshold Amplitude: 0.625 V
Lead Channel Pacing Threshold Amplitude: 0.875 V
Lead Channel Pacing Threshold Amplitude: 1.25 V
Lead Channel Pacing Threshold Pulse Width: 0.4 ms
Lead Channel Pacing Threshold Pulse Width: 0.4 ms
Lead Channel Pacing Threshold Pulse Width: 0.8 ms
Lead Channel Sensing Intrinsic Amplitude: 10.625 mV
Lead Channel Sensing Intrinsic Amplitude: 10.625 mV
Lead Channel Sensing Intrinsic Amplitude: 4 mV
Lead Channel Sensing Intrinsic Amplitude: 4 mV
Lead Channel Setting Pacing Amplitude: 1.5 V
Lead Channel Setting Pacing Amplitude: 2 V
Lead Channel Setting Pacing Amplitude: 2.25 V
Lead Channel Setting Pacing Pulse Width: 0.4 ms
Lead Channel Setting Pacing Pulse Width: 0.8 ms
Lead Channel Setting Sensing Sensitivity: 0.3 mV

## 2019-04-05 MED ORDER — BISOPROLOL FUMARATE 5 MG PO TABS: 10.0000 mg | ORAL_TABLET | Freq: Every day | ORAL | Status: DC

## 2019-04-05 NOTE — Telephone Encounter (Signed)
Increased bisoprol as directed.   Discussed with Dr. Curt Bears and follow up OK as scheduled.   Pt verbalized understanding of med changes and confirmed pharmacy. No further questions at this time.    Carlos Gibson [address]" [place], PA-C  04/05/2019 11:58 AM

## 2019-04-05 NOTE — Telephone Encounter (Signed)
Initial therapy episode. Needs follow up in clinic with me or APP.

## 2019-04-05 NOTE — Addendum Note (Signed)
Addended by: Barrington Ellison A on: 04/05/2019 11:59 AM   Modules accepted: Orders

## 2019-04-05 NOTE — Telephone Encounter (Signed)
  Scheduled remote contained alert for VT with successful ATP/aborted defib on 02/13/2019.  Pt had no specific symptoms around that time that he remembers.  Denies any acute illness, ischemic symptoms, or medication non-compliance.   Pt had increased episodes of NSVT in November, but none since 03/17/2019.   He is now aware of [place] DMV guidelines of no driving for 6 months post appropriate therapy (May 2021) and verbalizes understanding.   Will check labs when he comes for coumadin check next week, and send to Dr. Curt Bears for further recommendation if any.  Last EF ~25% 2019, CHF meds include bisoprol 5 mg, losartan, spiro, and on amiodarone 200 mg daily for atrial fibrillation.   Will need to adjust alerts at visit in January as successful ATP in VF zone did not send alert.

## 2019-04-06 ENCOUNTER — Other Ambulatory Visit: Payer: Self-pay | Admitting: Cardiology

## 2019-04-06 MED ORDER — POTASSIUM CHLORIDE ER 10 MEQ PO TBCR: 10.0000 meq | EXTENDED_RELEASE_TABLET | Freq: Every day | ORAL | 3 refills | Status: DC

## 2019-04-06 MED ORDER — SPIRONOLACTONE 25 MG PO TABS: ORAL_TABLET | ORAL | 3 refills | Status: DC

## 2019-04-06 MED ORDER — BISOPROLOL FUMARATE 10 MG PO TABS: 10.0000 mg | ORAL_TABLET | Freq: Every day | ORAL | 3 refills | Status: DC

## 2019-04-06 MED ORDER — AMIODARONE HCL 200 MG PO TABS: 200.0000 mg | ORAL_TABLET | Freq: Every day | ORAL | 3 refills | Status: DC

## 2019-04-06 MED ORDER — WARFARIN SODIUM 5 MG PO TABS: ORAL_TABLET | ORAL | 0 refills | Status: DC

## 2019-04-06 MED ORDER — LOSARTAN POTASSIUM 25 MG PO TABS: ORAL_TABLET | ORAL | 3 refills | Status: DC

## 2019-04-06 NOTE — Telephone Encounter (Signed)
Pt was due to have an INR check on 03/29/19, pt cancelled appt and rescheduled for 04/12/19.  Will refill rx x 1 only to ensure compliance.

## 2019-04-06 NOTE — Telephone Encounter (Signed)
*  STAT* If patient is at the pharmacy, call can be transferred to refill team.   1. Which medications need to be refilled? (please list name of each medication and dose if known) spironolactone (ALDACTONE) 25 MG tablet  2. Which pharmacy/location (including street and city if local pharmacy) is medication to be sent to? [facility] - [place], [place] - [address]   3. Do they need a 30 day or 90 day supply? 90 days

## 2019-04-06 NOTE — Telephone Encounter (Signed)
New Message   *STAT* If patient is at the pharmacy, call can be transferred to refill team.   1. Which medications need to be refilled? (please list name of each medication and dose if known) warfarin (COUMADIN) 5 MG tablet amiodarone (PACERONE) 200 MG tablet losartan (COZAAR) 25 MG tablet potassium chloride (KLOR-CON) 10 MEQ tablet bisoprolol (ZEBETA) tablet 10 mg  2. Which pharmacy/location (including street and city if local pharmacy) is medication to be sent to? [facility] - [place], [place] - [place] [address]  3. Do they need a 30 day or 90 day supply? 90 day

## 2019-04-07 ENCOUNTER — Other Ambulatory Visit: Payer: Self-pay | Admitting: Cardiology

## 2019-04-09 ENCOUNTER — Other Ambulatory Visit: Payer: Self-pay | Admitting: Cardiology

## 2019-04-12 ENCOUNTER — Other Ambulatory Visit: Payer: Self-pay | Admitting: Cardiology

## 2019-04-12 ENCOUNTER — Other Ambulatory Visit: Payer: Self-pay

## 2019-04-12 ENCOUNTER — Ambulatory Visit (INDEPENDENT_AMBULATORY_CARE_PROVIDER_SITE_OTHER): Payer: Medicare Other | Admitting: *Deleted

## 2019-04-12 ENCOUNTER — Other Ambulatory Visit: Payer: Medicare Other | Admitting: *Deleted

## 2019-04-12 DIAGNOSIS — I483 Typical atrial flutter: Secondary | ICD-10-CM

## 2019-04-12 DIAGNOSIS — I472 Ventricular tachycardia, unspecified: Secondary | ICD-10-CM

## 2019-04-12 DIAGNOSIS — E039 Hypothyroidism, unspecified: Secondary | ICD-10-CM | POA: Diagnosis not present

## 2019-04-12 DIAGNOSIS — Z7901 Long term (current) use of anticoagulants: Secondary | ICD-10-CM | POA: Diagnosis not present

## 2019-04-12 LAB — POCT INR: INR: 3.4 — AB (ref 2.0–3.0)

## 2019-04-12 NOTE — Patient Instructions (Addendum)
Description   Hold today's dose then start taking the dose you should be taking which is 1 tablet daily except for 1/2 tablet on Saturdays.  Recheck INR in 2 weeks. Coumadin Clinic [phone]

## 2019-04-13 ENCOUNTER — Telehealth: Payer: Self-pay

## 2019-04-13 ENCOUNTER — Telehealth: Payer: Self-pay | Admitting: Student

## 2019-04-13 DIAGNOSIS — R7989 Other specified abnormal findings of blood chemistry: Secondary | ICD-10-CM

## 2019-04-13 LAB — COMPREHENSIVE METABOLIC PANEL
ALT: 13 IU/L (ref 0–44)
AST: 14 IU/L (ref 0–40)
Albumin/Globulin Ratio: 1.3 (ref 1.2–2.2)
Albumin: 4.1 g/dL (ref 3.7–4.7)
Alkaline Phosphatase: 66 IU/L (ref 39–117)
BUN/Creatinine Ratio: 14 (ref 10–24)
BUN: 25 mg/dL (ref 8–27)
Bilirubin Total: 0.9 mg/dL (ref 0.0–1.2)
CO2: 23 mmol/L (ref 20–29)
Calcium: 9.5 mg/dL (ref 8.6–10.2)
Chloride: 104 mmol/L (ref 96–106)
Creatinine, Ser: 1.82 mg/dL — ABNORMAL HIGH (ref 0.76–1.27)
GFR calc Af Amer: 40 mL/min/{1.73_m2} — ABNORMAL LOW (ref >59)
GFR calc non Af Amer: 35 mL/min/{1.73_m2} — ABNORMAL LOW (ref >59)
Globulin, Total: 3.2 g/dL (ref 1.5–4.5)
Glucose: 142 mg/dL — ABNORMAL HIGH (ref 65–99)
Potassium: 4.6 mmol/L (ref 3.5–5.2)
Sodium: 140 mmol/L (ref 134–144)
Total Protein: 7.3 g/dL (ref 6.0–8.5)

## 2019-04-13 LAB — CBC
Hematocrit: 43.5 % (ref 37.5–51.0)
Hemoglobin: 14.3 g/dL (ref 13.0–17.7)
MCH: 28.2 pg (ref 26.6–33.0)
MCHC: 32.9 g/dL (ref 31.5–35.7)
MCV: 86 fL (ref 79–97)
Platelets: 140 10*3/uL — ABNORMAL LOW (ref 150–450)
RBC: 5.07 x10E6/uL (ref 4.14–5.80)
RDW: 13.8 % (ref 11.6–15.4)
WBC: 4 10*3/uL (ref 3.4–10.8)

## 2019-04-13 LAB — TSH: TSH: 8.83 u[IU]/mL — ABNORMAL HIGH (ref 0.450–4.500)

## 2019-04-13 LAB — MAGNESIUM: Magnesium: 2.2 mg/dL (ref 1.6–2.3)

## 2019-04-13 NOTE — Telephone Encounter (Signed)
-----   Message from Shirley Friar, PA-C sent at 04/13/2019  9:21 AM EST ----- Pt with recent therapy for VT.   Cr slightly elevated, but not far from baseline.   TSH elevated.  Needs a Free T3 and Free T4 at his earliest convenience.   Legrand Como [address] [place], PA-C  04/13/2019 9:20 AM

## 2019-04-13 NOTE — Telephone Encounter (Signed)
The patient has been notified of the lab results and verbalized understanding.  All questions (if any) were answered. Frederik Schmidt, RN 04/13/2019 10:51 AM   The patient will come in on 1/11 for Free T3 and T4.

## 2019-04-13 NOTE — Telephone Encounter (Signed)
Follow Up:   Returning Carlos Gibson call from today, concerning his lab results.

## 2019-04-13 NOTE — Telephone Encounter (Signed)
lpmtcb (Labs) 1/7

## 2019-04-17 ENCOUNTER — Other Ambulatory Visit: Payer: Self-pay

## 2019-04-17 ENCOUNTER — Other Ambulatory Visit: Payer: Medicare Other | Admitting: *Deleted

## 2019-04-17 DIAGNOSIS — R7989 Other specified abnormal findings of blood chemistry: Secondary | ICD-10-CM | POA: Diagnosis not present

## 2019-04-17 DIAGNOSIS — E039 Hypothyroidism, unspecified: Secondary | ICD-10-CM | POA: Diagnosis not present

## 2019-04-17 LAB — T3, FREE: T3, Free: 2 pg/mL (ref 2.0–4.4)

## 2019-04-17 LAB — T4, FREE: Free T4: 1.28 ng/dL (ref 0.82–1.77)

## 2019-04-27 ENCOUNTER — Ambulatory Visit (INDEPENDENT_AMBULATORY_CARE_PROVIDER_SITE_OTHER): Payer: Medicare Other | Admitting: Cardiology

## 2019-04-27 ENCOUNTER — Encounter: Payer: Self-pay | Admitting: Cardiology

## 2019-04-27 ENCOUNTER — Ambulatory Visit (INDEPENDENT_AMBULATORY_CARE_PROVIDER_SITE_OTHER): Payer: Medicare Other | Admitting: *Deleted

## 2019-04-27 ENCOUNTER — Other Ambulatory Visit: Payer: Self-pay

## 2019-04-27 VITALS — BP 106/64 | HR 70 | Ht 71.0 in | Wt 178.0 lb

## 2019-04-27 DIAGNOSIS — I483 Typical atrial flutter: Secondary | ICD-10-CM | POA: Diagnosis not present

## 2019-04-27 DIAGNOSIS — Z5181 Encounter for therapeutic drug level monitoring: Secondary | ICD-10-CM

## 2019-04-27 DIAGNOSIS — Z7901 Long term (current) use of anticoagulants: Secondary | ICD-10-CM | POA: Diagnosis not present

## 2019-04-27 DIAGNOSIS — I428 Other cardiomyopathies: Secondary | ICD-10-CM | POA: Diagnosis not present

## 2019-04-27 LAB — POCT INR: INR: 4.5 — AB (ref 2.0–3.0)

## 2019-04-27 NOTE — Patient Instructions (Addendum)
Description   Hold warfarin today and tomorrow, then start  taking 1 tablet daily except for 1/2 tablet on Tuesdays and Saturdays.  Recheck INR in 2 weeks. Coumadin Clinic [phone]

## 2019-04-27 NOTE — Patient Instructions (Signed)
Medication Instructions:  Your physician recommends that you continue on your current medications as directed. Please refer to the Current Medication list given to you today.  *If you need a refill on your cardiac medications before your next appointment, please call your pharmacy*  Labwork: None ordered If you have labs (blood work) drawn today and your tests are completely normal, you will receive your results only by:  [facility] (if you have MyChart) OR  A paper copy in the mail If you have any lab test that is abnormal or we need to change your treatment, we will call you to review the results.  Testing/Procedures: None ordered  Follow-Up: Remote monitoring is used to monitor your Pacemaker or ICD from home. This monitoring reduces the number of office visits required to check your device to one time per year. It allows [place] to keep an eye on the functioning of your device to ensure it is working properly. You are scheduled for a device check from home on 07/04/2019. You may send your transmission at any time that day. If you have a wireless device, the transmission will be sent automatically. After your physician reviews your transmission, you will receive a postcard with your next transmission date.  At [facility], you and your health needs are our priority.  As part of our continuing mission to provide you with exceptional heart care, we have created designated Provider Care Teams.  These Care Teams include your primary Cardiologist (physician) and Advanced Practice Providers (APPs -  Physician Assistants and Nurse Practitioners) who all work together to provide you with the care you need, when you need it.  You will need a follow up appointment in 6 months.  Please call our office 2 months in advance to schedule this appointment.  You may see Dr Curt Bears or one of the following Advanced Practice Providers on your designated Care Team:    Chanetta Marshall, NP  Tommye Standard, PA-C   Oda [place], [place]  Thank you for choosing [facility]!!   Trinidad Curet, RN [phone]  Any Other Special Instructions Will Be Listed Below (If Applicable).  Please have your primary doctor evaluate your abnormal thyroid

## 2019-04-27 NOTE — Progress Notes (Signed)
Electrophysiology Office Note   Date:  04/27/2019   ID:  Carlos Gibson, DOB 12-May-1940, MRN [id number]  PCP:  [name], MD  Cardiologist:  Marlou Porch Primary Electrophysiologist:  Constance Haw, MD    No chief complaint on file.    History of Present Illness: Carlos Gibson is a 79 y.o. male who presents today for electrophysiology evaluation.   Hx NICM/chronic systolic CHF (LHC [id number] with normal cors &EF 25-30%; last echo 05/2015 with EF 35-40%), intolerance to prior Toprol/carvedilol due to "feeling bad," secondary pulmonary hypertension, frequent PVCs, aortic root dilatation, Trifasicular block (RBBB + LAFB + 1st degree AVB), diabetes, Gilbert's syndrome, mild AI/MR by echo 05/2015, hyperlipidemia, thrombocytopenia, probable CKD stage III.  He was put on Entresto.  His ejection fraction has since improved to 40-45%.  He is now status post Medtronic CRT-D implanted 01/26/2018.  His ejection fraction prior to was 25 to 30%.  Today, denies symptoms of palpitations, chest pain, shortness of breath, orthopnea, PND, lower extremity edema, claudication, dizziness, presyncope, syncope, bleeding, or neurologic sequela. The patient is tolerating medications without difficulties.  He is overall doing well.  No chest pain or shortness of breath.  Is able to do all of his daily activities.  He did have ATP therapy 02/13/2019.  He has felt well without complaint.   Past Medical History:  Diagnosis Date  . Aortic regurgitation    a. mild AI by echo 01/2016.  [name] Aortic root dilation (HCC)   . Aortic valve regurgitation   . Bifascicular block   . Chronic systolic CHF (congestive heart failure) ([place])   . CKD (chronic kidney disease), stage III 01/01/2016  . Diabetes ([place])   . Ejection fraction < 50%   . Frequent PVCs   . Gilbert's syndrome   . Hyperlipidemia   . Mild diastolic dysfunction   . Mitral regurgitation    a. mod by echo 10/207.  [name] NICM (nonischemic cardiomyopathy) ([place])   .  Pericardial effusion    a. small by echo 01/2016.  . Pulmonary hypertension ([place])   . Right BBB/left post fasc block   . Thrombocytopenia ([place])    Past Surgical History:  Procedure Laterality Date  . BIV ICD INSERTION CRT-D N/A 01/26/2018   Procedure: BIV ICD INSERTION CRT-D;  Surgeon: Constance Haw, MD;  Location: [place] [facility];  Service: Cardiovascular;  Laterality: N/A;  . CARDIAC CATHETERIZATION N/A 12/20/2014   Procedure: Right/Left Heart Cath and Coronary Angiography;  Surgeon: Jerline Pain, MD;  Location: [place] [facility];  Service: Cardiovascular;  Laterality: N/A;  . CARDIOVERSION N/A 10/24/2018   Procedure: CARDIOVERSION;  Surgeon: Thayer Headings, MD;  Location: [facility] ENDOSCOPY;  Service: Cardiovascular;  Laterality: N/A;  . HERNIA REPAIR  06/2017     Current Outpatient Medications  Medication Sig Dispense Refill  . acetaminophen (TYLENOL) 650 MG CR tablet Take 650 mg by mouth every 8 (eight) hours as needed for pain.    [name] alfuzosin (UROXATRAL) 10 MG 24 hr tablet Take 10 mg by mouth at bedtime.   3  . amiodarone (PACERONE) 200 MG tablet TAKE 1 TABLET(200 MG) BY MOUTH DAILY 90 tablet 3  . atorvastatin (LIPITOR) 10 MG tablet Take 10 mg by mouth at bedtime.    . bisoprolol (ZEBETA) 10 MG tablet Take 1 tablet (10 mg total) by mouth daily. 90 tablet 3  . Cholecalciferol (VITAMIN D3) 25 MCG (1000 UT) CAPS Take 1,000 Units by mouth every other day.     [name]  finasteride (PROSCAR) 5 MG tablet Take 5 mg by mouth daily.   2  . furosemide (LASIX) 40 MG tablet Take 40 mg by mouth as needed for fluid or edema.    [name] glimepiride (AMARYL) 2 MG tablet Take 2 mg by mouth at bedtime.     [name] losartan (COZAAR) 25 MG tablet TAKE 1 TABLET(25 MG) BY MOUTH DAILY 90 tablet 3  . potassium chloride (KLOR-CON) 10 MEQ tablet Take 1 tablet (10 mEq total) by mouth daily. 90 tablet 3  . spironolactone (ALDACTONE) 25 MG tablet TAKE 1/2 TABLET(12.5 MG) BY MOUTH DAILY 45 tablet 3  . traMADol (ULTRAM) 50  MG tablet Take by mouth every 6 (six) hours as needed.    . warfarin (COUMADIN) 5 MG tablet Take 1 tablet daily except 1/2 tablet of Saturdays or as directed by Anticoagulation Clinic. 30 tablet 1   Current Facility-Administered Medications  Medication Dose Route Frequency Provider Last Rate Last Admin  . bisoprolol (ZEBETA) tablet 10 mg  10 mg Oral Daily Shirley Friar, PA-C        Allergies:   Ace inhibitors, Coreg [carvedilol], and Toprol xl [metoprolol tartrate]   Social History:  The patient  reports that he has never smoked. He has never used smokeless tobacco. He reports that he does not drink alcohol or use drugs.   Family History:  The patient's family history includes Hypertension in his mother; Irregular heart beat in his father; Stroke in his mother; Unexplained death in his father.   ROS:  Please see the history of present illness.   Otherwise, review of systems is positive for none.   All other systems are reviewed and negative.   PHYSICAL EXAM: VS:  BP 106/64   Pulse 70   Ht 5\' 11"  (1.803 m)   Wt 178 lb (80.7 kg)   SpO2 97%   BMI 24.83 kg/m  , BMI Body mass index is 24.83 kg/m. GEN: Well nourished, well developed, in no acute distress  HEENT: normal  Neck: no JVD, carotid bruits, or masses Cardiac: RRR; no murmurs, rubs, or gallops,no edema  Respiratory:  clear to auscultation bilaterally, normal work of breathing GI: soft, nontender, nondistended, + BS MS: no deformity or atrophy  Skin: warm and dry, device site well healed Neuro:  Strength and sensation are intact Psych: euthymic mood, full affect  EKG:  EKG is ordered today. Personal review of the ekg ordered shows AV paced  Personal review of the device interrogation today. Results in [place]: 04/12/2019: ALT 13; BUN 25; Creatinine, Ser 1.82; Hemoglobin 14.3; Magnesium 2.2; Platelets 140; Potassium 4.6; Sodium 140; TSH 8.830    Lipid Panel  No results found for: CHOL, TRIG, HDL,  CHOLHDL, VLDL, LDLCALC, LDLDIRECT   Wt Readings from Last 3 Encounters:  04/27/19 178 lb (80.7 kg)  01/24/19 172 lb (78 kg)  01/13/19 175 lb (79.4 kg)      Other studies Reviewed: Additional studies/ records that were reviewed today include: CMRI  Review of the above records today demonstrates:  1. Severely dilated left ventricle with normal wall thickness and severely reduced systolic function (LVEF = 34%). There is diffuse hypokinesis.  There is no late gadolinium enhancement in the left ventricular myocardium.  2. Mildly dilated right ventricular size with normal wall thickness and moderately decreased systolic function (LVEF = 32%). There are no regional wall motion abnormalities.  3.  Severe bi-atrial dilatation.  No left atrial thrombus.  4. Normal size of  the aortic root, ascending aorta. Dilated pulmonary artery measuring 34 mm.  5. Moderate mitral regurgitation with posteriorly directed jet. Mild to moderate tricuspid regurgitation.  6.  Normal pericardium.  Trivial pericardial effusion.  ASSESSMENT AND PLAN:  1.  Nonischemic cardiomyopathy: On optimal medical therapy with losartan and bisoprolol.  Status post Medtronic CRT-D implanted 01/26/2018.  Device functioning appropriately.  No changes at this time.  2. PVCs: Has decreased his BiV pacing.  Currently on amiodarone.  3. Hypertension: Well-controlled  4.  Persistent atrial fibrillation/atrial flutter: Currently on warfarin and amiodarone.  CHA2DS2-VASc of 4.    5.  Ventricular tachycardia: Patient received ATP 02/13/2019.  Remains on amiodarone.  We Brittish Bolinger continue with no changes in therapy.  Discussed no driving for 6 months per [facility].  Current medicines are reviewed at length with the patient today.   The patient does not have concerns regarding his medicines.  The following changes were made today: none  Labs/ tests ordered today include:  Orders Placed This Encounter  Procedures  .  EKG 12-Lead    Disposition:   FU with Braylea Brancato 6 months  Signed, Taleeyah Bora Meredith Leeds, MD  04/27/2019 2:32 PM     [facility] [address] [place] [address] [phone] (office) [phone] (fax)

## 2019-05-05 DIAGNOSIS — I10 Essential (primary) hypertension: Secondary | ICD-10-CM | POA: Diagnosis not present

## 2019-05-05 DIAGNOSIS — E7800 Pure hypercholesterolemia, unspecified: Secondary | ICD-10-CM | POA: Diagnosis not present

## 2019-05-05 DIAGNOSIS — N400 Enlarged prostate without lower urinary tract symptoms: Secondary | ICD-10-CM | POA: Diagnosis not present

## 2019-05-05 DIAGNOSIS — E1169 Type 2 diabetes mellitus with other specified complication: Secondary | ICD-10-CM | POA: Diagnosis not present

## 2019-05-05 DIAGNOSIS — I5020 Unspecified systolic (congestive) heart failure: Secondary | ICD-10-CM | POA: Diagnosis not present

## 2019-05-11 ENCOUNTER — Other Ambulatory Visit: Payer: Self-pay

## 2019-05-11 ENCOUNTER — Ambulatory Visit (INDEPENDENT_AMBULATORY_CARE_PROVIDER_SITE_OTHER): Payer: Medicare Other | Admitting: *Deleted

## 2019-05-11 DIAGNOSIS — Z7901 Long term (current) use of anticoagulants: Secondary | ICD-10-CM

## 2019-05-11 DIAGNOSIS — Z5181 Encounter for therapeutic drug level monitoring: Secondary | ICD-10-CM | POA: Diagnosis not present

## 2019-05-11 DIAGNOSIS — I483 Typical atrial flutter: Secondary | ICD-10-CM | POA: Diagnosis not present

## 2019-05-11 LAB — POCT INR: INR: 2.7 (ref 2.0–3.0)

## 2019-05-11 NOTE — Patient Instructions (Signed)
Description   Continue taking 1 tablet daily except for 1/2 tablet on Tuesdays and Saturdays.  Recheck INR in 3 weeks. Coumadin Clinic [phone]    

## 2019-05-16 ENCOUNTER — Other Ambulatory Visit: Payer: Self-pay | Admitting: Cardiology

## 2019-05-16 MED ORDER — POTASSIUM CHLORIDE ER 10 MEQ PO TBCR: 10.0000 meq | EXTENDED_RELEASE_TABLET | Freq: Every day | ORAL | 0 refills | Status: DC

## 2019-05-17 MED ORDER — POTASSIUM CHLORIDE ER 10 MEQ PO TBCR: 10.0000 meq | EXTENDED_RELEASE_TABLET | Freq: Every day | ORAL | 2 refills | Status: DC

## 2019-05-17 NOTE — Addendum Note (Signed)
Addended by: Derl Barrow on: 05/17/2019 09:24 AM   Modules accepted: Orders

## 2019-05-19 ENCOUNTER — Telehealth: Payer: Self-pay | Admitting: Cardiology

## 2019-05-19 DIAGNOSIS — I10 Essential (primary) hypertension: Secondary | ICD-10-CM | POA: Diagnosis not present

## 2019-05-19 DIAGNOSIS — N400 Enlarged prostate without lower urinary tract symptoms: Secondary | ICD-10-CM | POA: Diagnosis not present

## 2019-05-19 DIAGNOSIS — E7800 Pure hypercholesterolemia, unspecified: Secondary | ICD-10-CM | POA: Diagnosis not present

## 2019-05-19 DIAGNOSIS — I5020 Unspecified systolic (congestive) heart failure: Secondary | ICD-10-CM | POA: Diagnosis not present

## 2019-05-19 DIAGNOSIS — E1169 Type 2 diabetes mellitus with other specified complication: Secondary | ICD-10-CM | POA: Diagnosis not present

## 2019-05-19 MED ORDER — BISOPROLOL FUMARATE 10 MG PO TABS: 10.0000 mg | ORAL_TABLET | Freq: Every day | ORAL | 8 refills | Status: DC

## 2019-05-19 NOTE — Telephone Encounter (Signed)
Pt's medication was sent to pt's pharmacy as requested. Confirmation received.  °

## 2019-05-19 NOTE — Telephone Encounter (Signed)
*  STAT* If patient is at the pharmacy, call can be transferred to refill team.   1. Which medications need to be reled? (please list name of each medication and dose if known)  Bisoprolol  2. Which pharmacy/location (including street and city if local pharmacy) is medication to be sent to? [facility]- [place], [place]  3. Do they need a 30 day or 90 day supply? 30 and refills

## 2019-06-01 ENCOUNTER — Ambulatory Visit (INDEPENDENT_AMBULATORY_CARE_PROVIDER_SITE_OTHER): Payer: Medicare Other | Admitting: *Deleted

## 2019-06-01 ENCOUNTER — Other Ambulatory Visit: Payer: Self-pay

## 2019-06-01 DIAGNOSIS — I483 Typical atrial flutter: Secondary | ICD-10-CM

## 2019-06-01 DIAGNOSIS — Z5181 Encounter for therapeutic drug level monitoring: Secondary | ICD-10-CM

## 2019-06-01 DIAGNOSIS — Z7901 Long term (current) use of anticoagulants: Secondary | ICD-10-CM

## 2019-06-01 LAB — POCT INR: INR: 4 — AB (ref 2.0–3.0)

## 2019-06-01 NOTE — Patient Instructions (Signed)
Description   Hold today and then continue taking 1 tablet daily except for 1/2 tablet on Tuesdays and Saturdays.  Recheck INR in 2 weeks. Coumadin Clinic [phone]

## 2019-06-05 ENCOUNTER — Ambulatory Visit: Payer: Medicare Other | Attending: Internal Medicine

## 2019-06-05 DIAGNOSIS — Z23 Encounter for immunization: Secondary | ICD-10-CM | POA: Insufficient documentation

## 2019-06-05 NOTE — Progress Notes (Signed)
   Covid-19 Vaccination Clinic  Name:  HILMON GRADNEY    MRN: [id number] DOB: 08-24-1940  06/05/2019  Mr. Barraco was observed post Covid-19 immunization for 15 minutes without incidence. He was provided with Vaccine Information Sheet and instruction to access the V-Safe system.   Mr. Goebel was instructed to call 911 with any severe reactions post vaccine: [name] Difficulty breathing  . Swelling of your face and throat  . A fast heartbeat  . A bad rash all over your body  . Dizziness and weakness    Immunizations Administered    Name Date Dose VIS Date Route   Pfizer COVID-19 Vaccine 06/05/2019  1:02 PM 0.3 mL 03/17/2019 Intramuscular   Manufacturer: [place]   Lot: [id number]   [place]: [id number]

## 2019-06-23 ENCOUNTER — Other Ambulatory Visit: Payer: Self-pay | Admitting: Cardiology

## 2019-06-23 MED ORDER — WARFARIN SODIUM 5 MG PO TABS: ORAL_TABLET | ORAL | 0 refills | Status: DC

## 2019-06-23 NOTE — Telephone Encounter (Signed)
*  STAT* If patient is at the pharmacy, call can be transferred to refill team.   1. Which medications need to be refilled? (please list name of each medication and dose if known) warfarin (COUMADIN) 5 MG tablet  2. Which pharmacy/location (including street and city if local pharmacy) is medication to be sent to? [facility] - [place], [place] - [place] [address]  3. Do they need a 30 day or 90 day supply? [place]

## 2019-06-23 NOTE — Telephone Encounter (Signed)
Pt is overdue for follow-up, pt no showed for appt on 06/15/19. Called spoke with pt, he states he was working and forgot all about his last appt.  Pt scheduled follow-up for Tuesday 06/27/19, will refill x 30 days only since pt is overdue for follow-up.

## 2019-06-28 ENCOUNTER — Ambulatory Visit: Payer: Medicare Other | Attending: Internal Medicine

## 2019-06-28 DIAGNOSIS — Z23 Encounter for immunization: Secondary | ICD-10-CM

## 2019-06-28 NOTE — Progress Notes (Signed)
   Covid-19 Vaccination Clinic  Name:  Carlos Gibson    MRN: [id number] DOB: 1940/07/01  06/28/2019  [name] was observed post Covid-19 immunization for 15 minutes without incident. He was provided with Vaccine Information Sheet and instruction to access the V-Safe system.   [name] was instructed to call 911 with any severe reactions post vaccine: [name] Difficulty breathing  . Swelling of face and throat  . A fast heartbeat  . A bad rash all over body  . Dizziness and weakness   Immunizations Administered    Name Date Dose VIS Date Route   Pfizer COVID-19 Vaccine 06/28/2019 12:52 PM 0.3 mL 03/17/2019 Intramuscular   Manufacturer: [place]   Lot: [id number]   [place]: [id number]

## 2019-07-04 ENCOUNTER — Ambulatory Visit (INDEPENDENT_AMBULATORY_CARE_PROVIDER_SITE_OTHER): Payer: Medicare Other | Admitting: *Deleted

## 2019-07-04 DIAGNOSIS — I5020 Unspecified systolic (congestive) heart failure: Secondary | ICD-10-CM | POA: Diagnosis not present

## 2019-07-04 DIAGNOSIS — I5022 Chronic systolic (congestive) heart failure: Secondary | ICD-10-CM

## 2019-07-04 DIAGNOSIS — N400 Enlarged prostate without lower urinary tract symptoms: Secondary | ICD-10-CM | POA: Diagnosis not present

## 2019-07-04 DIAGNOSIS — I10 Essential (primary) hypertension: Secondary | ICD-10-CM | POA: Diagnosis not present

## 2019-07-04 DIAGNOSIS — E7800 Pure hypercholesterolemia, unspecified: Secondary | ICD-10-CM | POA: Diagnosis not present

## 2019-07-04 DIAGNOSIS — E1169 Type 2 diabetes mellitus with other specified complication: Secondary | ICD-10-CM | POA: Diagnosis not present

## 2019-07-04 LAB — CUP PACEART REMOTE DEVICE CHECK
Battery Remaining Longevity: 56 mo
Battery Voltage: 2.97 V
Brady Statistic AP VP Percent: 96.94 %
Brady Statistic AP VS Percent: 0.02 %
Brady Statistic AS VP Percent: 3.03 %
Brady Statistic AS VS Percent: 0.01 %
Brady Statistic RA Percent Paced: 96.76 %
Brady Statistic RV Percent Paced: 99.71 %
Date Time Interrogation Session: 20210330012403
HighPow Impedance: 74 Ohm
Implantable Lead Implant Date: 20191023
Implantable Lead Implant Date: 20191023
Implantable Lead Implant Date: 20191023
Implantable Lead Location: 753858
Implantable Lead Location: 753859
Implantable Lead Location: 753860
Implantable Lead Model: 4598
Implantable Lead Model: 5076
Implantable Lead Model: 6935
Implantable Pulse Generator Implant Date: 20191023
Lead Channel Impedance Value: 194.634
Lead Channel Impedance Value: 207.273
Lead Channel Impedance Value: 214.783
Lead Channel Impedance Value: 220.723
Lead Channel Impedance Value: 237.12 Ohm
Lead Channel Impedance Value: 342 Ohm
Lead Channel Impedance Value: 380 Ohm
Lead Channel Impedance Value: 399 Ohm
Lead Channel Impedance Value: 437 Ohm
Lead Channel Impedance Value: 456 Ohm
Lead Channel Impedance Value: 456 Ohm
Lead Channel Impedance Value: 494 Ohm
Lead Channel Impedance Value: 570 Ohm
Lead Channel Impedance Value: 665 Ohm
Lead Channel Impedance Value: 665 Ohm
Lead Channel Impedance Value: 722 Ohm
Lead Channel Impedance Value: 760 Ohm
Lead Channel Impedance Value: 779 Ohm
Lead Channel Pacing Threshold Amplitude: 0.625 V
Lead Channel Pacing Threshold Amplitude: 1.125 V
Lead Channel Pacing Threshold Amplitude: 1.375 V
Lead Channel Pacing Threshold Pulse Width: 0.4 ms
Lead Channel Pacing Threshold Pulse Width: 0.4 ms
Lead Channel Pacing Threshold Pulse Width: 0.8 ms
Lead Channel Sensing Intrinsic Amplitude: 12 mV
Lead Channel Sensing Intrinsic Amplitude: 12 mV
Lead Channel Sensing Intrinsic Amplitude: 3.875 mV
Lead Channel Sensing Intrinsic Amplitude: 3.875 mV
Lead Channel Setting Pacing Amplitude: 1.5 V
Lead Channel Setting Pacing Amplitude: 2.25 V
Lead Channel Setting Pacing Amplitude: 2.75 V
Lead Channel Setting Pacing Pulse Width: 0.4 ms
Lead Channel Setting Pacing Pulse Width: 0.8 ms
Lead Channel Setting Sensing Sensitivity: 0.3 mV

## 2019-07-04 NOTE — Progress Notes (Signed)
ICD remote 

## 2019-07-24 ENCOUNTER — Ambulatory Visit (INDEPENDENT_AMBULATORY_CARE_PROVIDER_SITE_OTHER): Payer: Medicare Other | Admitting: *Deleted

## 2019-07-24 ENCOUNTER — Other Ambulatory Visit: Payer: Self-pay

## 2019-07-24 ENCOUNTER — Encounter (INDEPENDENT_AMBULATORY_CARE_PROVIDER_SITE_OTHER): Payer: Self-pay

## 2019-07-24 DIAGNOSIS — Z7901 Long term (current) use of anticoagulants: Secondary | ICD-10-CM | POA: Diagnosis not present

## 2019-07-24 DIAGNOSIS — Z5181 Encounter for therapeutic drug level monitoring: Secondary | ICD-10-CM | POA: Diagnosis not present

## 2019-07-24 DIAGNOSIS — I483 Typical atrial flutter: Secondary | ICD-10-CM

## 2019-07-24 LAB — POCT INR: INR: 2 (ref 2.0–3.0)

## 2019-07-24 NOTE — Patient Instructions (Signed)
Description   Continue taking 1 tablet daily except for 1/2 tablet on Tuesdays and Saturdays.  Recheck INR in 3 weeks. Coumadin Clinic [phone]

## 2019-07-27 ENCOUNTER — Ambulatory Visit: Payer: Medicare Other | Admitting: Cardiology

## 2019-07-28 ENCOUNTER — Ambulatory Visit: Payer: Medicare Other | Admitting: Cardiology

## 2019-07-31 DIAGNOSIS — N50812 Left testicular pain: Secondary | ICD-10-CM | POA: Diagnosis not present

## 2019-07-31 DIAGNOSIS — R3121 Asymptomatic microscopic hematuria: Secondary | ICD-10-CM | POA: Diagnosis not present

## 2019-07-31 DIAGNOSIS — N400 Enlarged prostate without lower urinary tract symptoms: Secondary | ICD-10-CM | POA: Diagnosis not present

## 2019-08-04 ENCOUNTER — Other Ambulatory Visit: Payer: Self-pay

## 2019-08-04 DIAGNOSIS — E1169 Type 2 diabetes mellitus with other specified complication: Secondary | ICD-10-CM | POA: Diagnosis not present

## 2019-08-04 DIAGNOSIS — E7800 Pure hypercholesterolemia, unspecified: Secondary | ICD-10-CM | POA: Diagnosis not present

## 2019-08-04 DIAGNOSIS — I5020 Unspecified systolic (congestive) heart failure: Secondary | ICD-10-CM | POA: Diagnosis not present

## 2019-08-04 DIAGNOSIS — I10 Essential (primary) hypertension: Secondary | ICD-10-CM | POA: Diagnosis not present

## 2019-08-04 DIAGNOSIS — N400 Enlarged prostate without lower urinary tract symptoms: Secondary | ICD-10-CM | POA: Diagnosis not present

## 2019-08-04 MED ORDER — WARFARIN SODIUM 5 MG PO TABS: ORAL_TABLET | ORAL | 0 refills | Status: DC

## 2019-08-17 ENCOUNTER — Ambulatory Visit (INDEPENDENT_AMBULATORY_CARE_PROVIDER_SITE_OTHER): Payer: Medicare Other | Admitting: *Deleted

## 2019-08-17 ENCOUNTER — Other Ambulatory Visit: Payer: Self-pay

## 2019-08-17 ENCOUNTER — Encounter (INDEPENDENT_AMBULATORY_CARE_PROVIDER_SITE_OTHER): Payer: Self-pay

## 2019-08-17 DIAGNOSIS — Z7901 Long term (current) use of anticoagulants: Secondary | ICD-10-CM | POA: Diagnosis not present

## 2019-08-17 DIAGNOSIS — I483 Typical atrial flutter: Secondary | ICD-10-CM

## 2019-08-17 LAB — POCT INR: INR: 4.8 — AB (ref 2.0–3.0)

## 2019-08-17 NOTE — Patient Instructions (Signed)
Description   Do not take any Warfarin today and No Warfarin tomorrow then continue taking 1 tablet daily except for 1/2 tablet on Tuesdays and Saturdays.  Recheck INR in 2 weeks. Coumadin Clinic [phone]

## 2019-08-18 ENCOUNTER — Encounter: Payer: Self-pay | Admitting: Cardiology

## 2019-08-18 ENCOUNTER — Ambulatory Visit (INDEPENDENT_AMBULATORY_CARE_PROVIDER_SITE_OTHER): Payer: Medicare Other | Admitting: Cardiology

## 2019-08-18 VITALS — BP 100/60 | HR 70 | Ht 71.0 in | Wt 178.0 lb

## 2019-08-18 DIAGNOSIS — I428 Other cardiomyopathies: Secondary | ICD-10-CM

## 2019-08-18 DIAGNOSIS — I472 Ventricular tachycardia, unspecified: Secondary | ICD-10-CM

## 2019-08-18 DIAGNOSIS — I480 Paroxysmal atrial fibrillation: Secondary | ICD-10-CM

## 2019-08-18 DIAGNOSIS — Z79899 Other long term (current) drug therapy: Secondary | ICD-10-CM | POA: Diagnosis not present

## 2019-08-18 NOTE — Patient Instructions (Signed)
Medication Instructions:   Your physician recommends that you continue on your current medications as directed. Please refer to the Current Medication list given to you today.  *If you need a refill on your cardiac medications before your next appointment, please call your pharmacy*   Lab Work:  TODAY--CMET, CBC, TSH, AND FREE T4  If you have labs (blood work) drawn today and your tests are completely normal, you will receive your results only by: [name] MyChart Message (if you have MyChart) OR . A paper copy in the mail If you have any lab test that is abnormal or we need to change your treatment, we will call you to review the results  Follow-Up: At [facility], you and your health needs are our priority.  As part of our continuing mission to provide you with exceptional heart care, we have created designated Provider Care Teams.  These Care Teams include your primary Cardiologist (physician) and Advanced Practice Providers (APPs -  Physician Assistants and Nurse Practitioners) who all work together to provide you with the care you need, when you need it.  We recommend signing up for the patient portal called "MyChart".  Sign up information is provided on this After Visit Summary.  MyChart is used to connect with patients for Virtual Visits (Telemedicine).  Patients are able to view lab/test results, encounter notes, upcoming appointments, etc.  Non-urgent messages can be sent to your provider as well.   To learn more about what you can do with MyChart, go to [email or web address].    Your next appointment:   6 month(s)  The format for your next appointment:   In Person  Provider:   [name], MD

## 2019-08-18 NOTE — Progress Notes (Signed)
Cardiology Office Note:    Date:  08/18/2019   ID:  Carlos Gibson, DOB [birth year], MRN [id number]  PCP:  [name], MD  Cardiologist:  [name], MD  Electrophysiologist:  Constance Haw, MD   Referring MD: [name], MD     History of Present Illness:    Carlos Gibson is a 79 y.o. male here for the follow-up of nonischemic cardiomyopathy EF 35 to 40%  Has Medtronic CRT-D implanted with previous ejection fraction of 25-30.  He had ATP therapy on 02/13/2019  Cardiac MRI EF of 34%.  Wife died in 06/06/18.  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  His son drives a truck.  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  Denies any fevers chills nausea vomiting syncope bleeding.  He is very Patent attorney.  Past Medical History:  Diagnosis Date  . Aortic regurgitation    a. mild AI by echo 01/2016.  [name] Aortic root dilation (HCC)   . Aortic valve regurgitation   . Bifascicular block   . Chronic systolic CHF (congestive heart failure) ([place])   . CKD (chronic kidney disease), stage III 01/01/2016  . Diabetes ([place])   . Ejection fraction < 50%   . Frequent PVCs   . Gilbert's syndrome   . Hyperlipidemia   . Mild diastolic dysfunction   . Mitral regurgitation    a. mod by echo 10/207.  [name] NICM (nonischemic cardiomyopathy) ([place])   . Pericardial effusion    a. small by echo 01/2016.  . Pulmonary hypertension ([place])   . Right BBB/left post fasc block   . Thrombocytopenia ([place])     Past Surgical History:  Procedure Laterality Date  . BIV ICD INSERTION CRT-D N/A 01/26/2018   Procedure: BIV ICD INSERTION CRT-D;  Surgeon: Constance Haw, MD;  Location: [place] [facility];  Service: Cardiovascular;  Laterality: N/A;  . CARDIAC CATHETERIZATION N/A 12/20/2014   Procedure: Right/Left Heart Cath and Coronary Angiography;  Surgeon: Jerline Pain, MD;  Location: [place] CV LAB;  Service: Cardiovascular;  Laterality: N/A;  . CARDIOVERSION N/A 10/24/2018   Procedure: CARDIOVERSION;  Surgeon: Thayer Headings, MD;  Location: [facility] ENDOSCOPY;  Service: Cardiovascular;  Laterality: N/A;  . HERNIA REPAIR  06/2017    Current Medications: Current Meds  Medication Sig  . acetaminophen (TYLENOL) 650 MG CR tablet Take 650 mg by mouth every 8 (eight) hours as needed for pain.  [name] alfuzosin (UROXATRAL) 10 MG 24 hr tablet Take 10 mg by mouth at bedtime.   [name] amiodarone (PACERONE) 200 MG tablet TAKE 1 TABLET(200 MG) BY MOUTH DAILY  . atorvastatin (LIPITOR) 10 MG tablet Take 10 mg by mouth at bedtime.  . bisoprolol (ZEBETA) 5 MG tablet Take 5 mg by mouth daily.  . Cholecalciferol (VITAMIN D3) 25 MCG (1000 UT) CAPS Take 1,000 Units by mouth every other day.   . finasteride (PROSCAR) 5 MG tablet Take 5 mg by mouth daily.   . furosemide (LASIX) 40 MG tablet Take 40 mg by mouth as needed for fluid or edema.  [name] glimepiride (AMARYL) 2 MG tablet Take 2 mg by mouth at bedtime.   [name] losartan (COZAAR) 25 MG tablet TAKE 1 TABLET(25 MG) BY MOUTH DAILY  . potassium chloride (KLOR-CON) 10 MEQ tablet Take 1 tablet (10 mEq total) by mouth daily.  [name] spironolactone (ALDACTONE) 25 MG tablet TAKE 1/2 TABLET(12.5 MG) BY MOUTH DAILY  . traMADol (ULTRAM) 50 MG tablet Take by mouth  every 6 (six) hours as needed.  . warfarin (COUMADIN) 5 MG tablet Take 1 tablet daily except 1/2 tablet on Tuesdays and Saturdays or as directed by Anticoagulation Clinic.     Allergies:   Ace inhibitors, Coreg [carvedilol], and Toprol xl [metoprolol tartrate]   Social History   Socioeconomic History  . Marital status: Married    Spouse name: Not on file  . Number of children: Not on file  . Years of education: Not on file  . Highest education level: Not on file  Occupational History  . Not on file  Tobacco Use  . Smoking status: Never Smoker  . Smokeless tobacco: Never Used  Substance and Sexual Activity  . Alcohol use: No    Alcohol/week: 0.0 standard drinks  . Drug use: No  . Sexual  activity: Not on file  Other Topics Concern  . Not on file  Social History Narrative  . Not on file   Social Determinants of Health   Financial Resource Strain:   . Difficulty of Paying Living Expenses:   Food Insecurity:   . Worried About Charity fundraiser in the Last Year:   . Arboriculturist in the Last Year:   Transportation Needs:   . Film/video editor (Medical):   [name] Lack of Transportation (Non-Medical):   Physical Activity:   . Days of Exercise per Week:   . Minutes of Exercise per Session:   Stress:   . Feeling of Stress :   Social Connections:   . Frequency of Communication with Friends and Family:   . Frequency of Social Gatherings with Friends and Family:   . Attends Religious Services:   . Active Member of Clubs or Organizations:   . Attends Archivist Meetings:   [name] Marital Status:      Family History: The patient's family history includes Hypertension in his mother; Irregular heart beat in his father; Stroke in his mother; Unexplained death in his father.  ROS:   Please see the history of present illness.     All other systems reviewed and are negative.  EKGs/Labs/Other Studies Reviewed:    The following studies were reviewed today: ECHO 2019  Recent Labs: 04/12/2019: ALT 13; BUN 25; Creatinine, Ser 1.82; Hemoglobin 14.3; Magnesium 2.2; Platelets 140; Potassium 4.6; Sodium 140; TSH 8.830  Recent Lipid Panel No results found for: CHOL, TRIG, HDL, CHOLHDL, VLDL, LDLCALC, LDLDIRECT  Physical Exam:    VS:  BP 100/60   Pulse 70   Ht 5' 11"  (1.803 m)   Wt 178 lb (80.7 kg)   SpO2 96%   BMI 24.83 kg/m     Wt Readings from Last 3 Encounters:  08/18/19 178 lb (80.7 kg)  04/27/19 178 lb (80.7 kg)  01/24/19 172 lb (78 kg)     GEN:  Well nourished, well developed in no acute distress HEENT: Normal NECK: No JVD; No carotid bruits LYMPHATICS: No lymphadenopathy CARDIAC: RRR, 2/6 diastolic murmur, no rubs, gallops RESPIRATORY:  Clear to  auscultation without rales, wheezing or rhonchi  ABDOMEN: Soft, non-tender, non-distended MUSCULOSKELETAL:  No edema; No deformity  SKIN: Warm and dry NEUROLOGIC:  Alert and oriented x 3 PSYCHIATRIC:  Normal affect   ASSESSMENT:    1. Nonischemic cardiomyopathy ([place])   2. Ventricular tachycardia ([place])   3. PAF (paroxysmal atrial fibrillation) ([place])   4. Medication management    PLAN:    In order of problems listed above:  Nonischemic cardiomyopathy -Continue with beta-blocker  and angiotensin receptor blocker.  Could not tolerate Entresto secondary to low blood pressure.  Monitoring amiodarone labs, high risk medication.  PVCs -This have decreased his biventricular pacing.  He is on amiodarone.  Essential hypertension -Currently well controlled on medications as above  Moderate mitral regurgitation moderate aortic regurgitation -Murmur heard also on exam.  Persistent atrial fibrillation flutter -On warfarin and amiodarone.  CHA2DS2-VASc 4.  Ventricular tachycardia -ATP 02/13/2019-Dr. Camnitz note reviewed.  Remains on amiodarone.  No changes made.  We will go ahead and check TSH, free T4, complete metabolic profile, CBC given his amiodarone use.  Medication Adjustments/Labs and Tests Ordered: Current medicines are reviewed at length with the patient today.  Concerns regarding medicines are outlined above.  Orders Placed This Encounter  Procedures  . TSH  . T4, free  . CBC  . Comp Met (CMET)   No orders of the defined types were placed in this encounter.   Patient Instructions  Medication Instructions:   Your physician recommends that you continue on your current medications as directed. Please refer to the Current Medication list given to you today.  *If you need a refill on your cardiac medications before your next appointment, please call your pharmacy*   Lab Work:  TODAY--CMET, CBC, TSH, AND FREE T4  If you have labs (blood work) drawn today and your  tests are completely normal, you will receive your results only by: [name] MyChart Message (if you have MyChart) OR . A paper copy in the mail If you have any lab test that is abnormal or we need to change your treatment, we will call you to review the results  Follow-Up: At [facility], you and your health needs are our priority.  As part of our continuing mission to provide you with exceptional heart care, we have created designated Provider Care Teams.  These Care Teams include your primary Cardiologist (physician) and Advanced Practice Providers (APPs -  Physician Assistants and Nurse Practitioners) who all work together to provide you with the care you need, when you need it.  We recommend signing up for the patient portal called "MyChart".  Sign up information is provided on this After Visit Summary.  MyChart is used to connect with patients for Virtual Visits (Telemedicine).  Patients are able to view lab/test results, encounter notes, upcoming appointments, etc.  Non-urgent messages can be sent to your provider as well.   To learn more about what you can do with MyChart, go to [email or web address].    Your next appointment:   6 month(s)  The format for your next appointment:   In Person  Provider:   [name], MD        Signed, [name], MD  08/18/2019 4:03 PM    [place]

## 2019-08-19 LAB — COMPREHENSIVE METABOLIC PANEL
ALT: 15 IU/L (ref 0–44)
AST: 17 IU/L (ref 0–40)
Albumin/Globulin Ratio: 1.5 (ref 1.2–2.2)
Albumin: 4.1 g/dL (ref 3.7–4.7)
Alkaline Phosphatase: 64 IU/L (ref 39–117)
BUN/Creatinine Ratio: 11 (ref 10–24)
BUN: 24 mg/dL (ref 8–27)
Bilirubin Total: 0.8 mg/dL (ref 0.0–1.2)
CO2: 23 mmol/L (ref 20–29)
Calcium: 9.3 mg/dL (ref 8.6–10.2)
Chloride: 104 mmol/L (ref 96–106)
Creatinine, Ser: 2.15 mg/dL — ABNORMAL HIGH (ref 0.76–1.27)
GFR calc Af Amer: 33 mL/min/{1.73_m2} — ABNORMAL LOW (ref >59)
GFR calc non Af Amer: 28 mL/min/{1.73_m2} — ABNORMAL LOW (ref >59)
Globulin, Total: 2.7 g/dL (ref 1.5–4.5)
Glucose: 127 mg/dL — ABNORMAL HIGH (ref 65–99)
Potassium: 4.2 mmol/L (ref 3.5–5.2)
Sodium: 141 mmol/L (ref 134–144)
Total Protein: 6.8 g/dL (ref 6.0–8.5)

## 2019-08-19 LAB — CBC
Hematocrit: 41.3 % (ref 37.5–51.0)
Hemoglobin: 13.5 g/dL (ref 13.0–17.7)
MCH: 28.5 pg (ref 26.6–33.0)
MCHC: 32.7 g/dL (ref 31.5–35.7)
MCV: 87 fL (ref 79–97)
Platelets: 130 10*3/uL — ABNORMAL LOW (ref 150–450)
RBC: 4.73 x10E6/uL (ref 4.14–5.80)
RDW: 13.2 % (ref 11.6–15.4)
WBC: 5.2 10*3/uL (ref 3.4–10.8)

## 2019-08-19 LAB — T4, FREE: Free T4: 1.28 ng/dL (ref 0.82–1.77)

## 2019-08-19 LAB — TSH: TSH: 7.73 u[IU]/mL — ABNORMAL HIGH (ref 0.450–4.500)

## 2019-08-23 ENCOUNTER — Other Ambulatory Visit: Payer: Self-pay | Admitting: Cardiology

## 2019-08-23 DIAGNOSIS — I10 Essential (primary) hypertension: Secondary | ICD-10-CM | POA: Diagnosis not present

## 2019-08-23 DIAGNOSIS — N400 Enlarged prostate without lower urinary tract symptoms: Secondary | ICD-10-CM | POA: Diagnosis not present

## 2019-08-23 DIAGNOSIS — E7800 Pure hypercholesterolemia, unspecified: Secondary | ICD-10-CM | POA: Diagnosis not present

## 2019-08-23 DIAGNOSIS — E1169 Type 2 diabetes mellitus with other specified complication: Secondary | ICD-10-CM | POA: Diagnosis not present

## 2019-08-23 DIAGNOSIS — I5020 Unspecified systolic (congestive) heart failure: Secondary | ICD-10-CM | POA: Diagnosis not present

## 2019-08-23 MED ORDER — WARFARIN SODIUM 5 MG PO TABS: ORAL_TABLET | ORAL | 0 refills | Status: DC

## 2019-08-23 NOTE — Telephone Encounter (Signed)
*  STAT* If patient is at the pharmacy, call can be transferred to refill team.   1. Which medications need to be refilled? (please list name of each medication and dose if known)  warfarin (COUMADIN) 5 MG tablet  2. Which pharmacy/location (including street and city if local pharmacy) is medication to be sent to? [facility] - [place], [place] - [place] [address]  3. Do they need a 30 day or 90 day supply? 30 with refills  [facility] makes pill packs for the patient for 30 days at a time. The pharmacy needs a new rx sent to them so that they can ship out the pill packs tomorrow

## 2019-09-01 ENCOUNTER — Ambulatory Visit (INDEPENDENT_AMBULATORY_CARE_PROVIDER_SITE_OTHER): Payer: Medicare Other | Admitting: *Deleted

## 2019-09-01 ENCOUNTER — Other Ambulatory Visit: Payer: Self-pay

## 2019-09-01 DIAGNOSIS — Z7901 Long term (current) use of anticoagulants: Secondary | ICD-10-CM

## 2019-09-01 DIAGNOSIS — I483 Typical atrial flutter: Secondary | ICD-10-CM

## 2019-09-01 LAB — POCT INR: INR: 2.5 (ref 2.0–3.0)

## 2019-09-01 NOTE — Patient Instructions (Signed)
Description   Continue taking 1 tablet daily except for 1/2 tablet on Tuesdays and Saturdays.  Recheck INR in 4 weeks. Coumadin Clinic [phone]

## 2019-09-22 ENCOUNTER — Other Ambulatory Visit: Payer: Self-pay | Admitting: Cardiology

## 2019-09-22 DIAGNOSIS — E1169 Type 2 diabetes mellitus with other specified complication: Secondary | ICD-10-CM | POA: Diagnosis not present

## 2019-09-22 DIAGNOSIS — I5020 Unspecified systolic (congestive) heart failure: Secondary | ICD-10-CM | POA: Diagnosis not present

## 2019-09-22 DIAGNOSIS — E7800 Pure hypercholesterolemia, unspecified: Secondary | ICD-10-CM | POA: Diagnosis not present

## 2019-09-22 DIAGNOSIS — I10 Essential (primary) hypertension: Secondary | ICD-10-CM | POA: Diagnosis not present

## 2019-09-22 DIAGNOSIS — N400 Enlarged prostate without lower urinary tract symptoms: Secondary | ICD-10-CM | POA: Diagnosis not present

## 2019-09-22 NOTE — Telephone Encounter (Signed)
*  STAT* If patient is at the pharmacy, call can be transferred to refill team.   1. Which medications need to be refilled? (please list name of each medication and dose if known)   warfarin (COUMADIN) 5 MG tablet    2. Which pharmacy/location (including street and city if local pharmacy) is medication to be sent to? [facility] - [place], [place] - [place] [address]  3. Do they need a 30 day or 90 day supply? 90 day supply

## 2019-09-22 NOTE — Telephone Encounter (Signed)
Refill request received through [employer].  Refilled Warfarin RX electronically already 90 day supply to [facility].

## 2019-09-29 ENCOUNTER — Ambulatory Visit (INDEPENDENT_AMBULATORY_CARE_PROVIDER_SITE_OTHER): Payer: Medicare Other | Admitting: *Deleted

## 2019-09-29 ENCOUNTER — Other Ambulatory Visit: Payer: Self-pay

## 2019-09-29 DIAGNOSIS — Z7901 Long term (current) use of anticoagulants: Secondary | ICD-10-CM | POA: Diagnosis not present

## 2019-09-29 DIAGNOSIS — I483 Typical atrial flutter: Secondary | ICD-10-CM | POA: Diagnosis not present

## 2019-09-29 LAB — POCT INR: INR: 2.2 (ref 2.0–3.0)

## 2019-09-29 NOTE — Patient Instructions (Signed)
Description   Continue taking 1 tablet daily except for 1/2 tablet on Tuesdays and Saturdays.  Recheck INR in 4 weeks. [facility] [phone]

## 2019-10-03 ENCOUNTER — Ambulatory Visit (INDEPENDENT_AMBULATORY_CARE_PROVIDER_SITE_OTHER): Payer: Medicare Other | Admitting: *Deleted

## 2019-10-03 DIAGNOSIS — I428 Other cardiomyopathies: Secondary | ICD-10-CM

## 2019-10-03 LAB — CUP PACEART REMOTE DEVICE CHECK
Battery Remaining Longevity: 53 mo
Battery Voltage: 2.97 V
Brady Statistic AP VP Percent: 96.13 %
Brady Statistic AP VS Percent: 0.03 %
Brady Statistic AS VP Percent: 3.82 %
Brady Statistic AS VS Percent: 0.02 %
Brady Statistic RA Percent Paced: 95.84 %
Brady Statistic RV Percent Paced: 99.5 %
Date Time Interrogation Session: 20210629043723
HighPow Impedance: 73 Ohm
Implantable Lead Implant Date: 20191023
Implantable Lead Implant Date: 20191023
Implantable Lead Implant Date: 20191023
Implantable Lead Location: 753858
Implantable Lead Location: 753859
Implantable Lead Location: 753860
Implantable Lead Model: 4598
Implantable Lead Model: 5076
Implantable Lead Model: 6935
Implantable Pulse Generator Implant Date: 20191023
Lead Channel Impedance Value: 184.154
Lead Channel Impedance Value: 191.854
Lead Channel Impedance Value: 202.091
Lead Channel Impedance Value: 220.723
Lead Channel Impedance Value: 231.878
Lead Channel Impedance Value: 342 Ohm
Lead Channel Impedance Value: 342 Ohm
Lead Channel Impedance Value: 399 Ohm
Lead Channel Impedance Value: 437 Ohm
Lead Channel Impedance Value: 437 Ohm
Lead Channel Impedance Value: 437 Ohm
Lead Channel Impedance Value: 494 Ohm
Lead Channel Impedance Value: 570 Ohm
Lead Channel Impedance Value: 646 Ohm
Lead Channel Impedance Value: 703 Ohm
Lead Channel Impedance Value: 760 Ohm
Lead Channel Impedance Value: 760 Ohm
Lead Channel Impedance Value: 779 Ohm
Lead Channel Pacing Threshold Amplitude: 0.625 V
Lead Channel Pacing Threshold Amplitude: 1 V
Lead Channel Pacing Threshold Amplitude: 1.625 V
Lead Channel Pacing Threshold Pulse Width: 0.4 ms
Lead Channel Pacing Threshold Pulse Width: 0.4 ms
Lead Channel Pacing Threshold Pulse Width: 0.8 ms
Lead Channel Sensing Intrinsic Amplitude: 11.375 mV
Lead Channel Sensing Intrinsic Amplitude: 11.375 mV
Lead Channel Sensing Intrinsic Amplitude: 2.75 mV
Lead Channel Sensing Intrinsic Amplitude: 2.75 mV
Lead Channel Setting Pacing Amplitude: 1.5 V
Lead Channel Setting Pacing Amplitude: 2 V
Lead Channel Setting Pacing Amplitude: 2.75 V
Lead Channel Setting Pacing Pulse Width: 0.4 ms
Lead Channel Setting Pacing Pulse Width: 0.8 ms
Lead Channel Setting Sensing Sensitivity: 0.3 mV

## 2019-10-04 NOTE — Progress Notes (Signed)
Remote ICD transmission.   

## 2019-10-12 DIAGNOSIS — I10 Essential (primary) hypertension: Secondary | ICD-10-CM | POA: Diagnosis not present

## 2019-10-12 DIAGNOSIS — E7800 Pure hypercholesterolemia, unspecified: Secondary | ICD-10-CM | POA: Diagnosis not present

## 2019-10-12 DIAGNOSIS — I5020 Unspecified systolic (congestive) heart failure: Secondary | ICD-10-CM | POA: Diagnosis not present

## 2019-10-12 DIAGNOSIS — E1169 Type 2 diabetes mellitus with other specified complication: Secondary | ICD-10-CM | POA: Diagnosis not present

## 2019-10-12 DIAGNOSIS — N400 Enlarged prostate without lower urinary tract symptoms: Secondary | ICD-10-CM | POA: Diagnosis not present

## 2019-10-27 ENCOUNTER — Ambulatory Visit (INDEPENDENT_AMBULATORY_CARE_PROVIDER_SITE_OTHER): Payer: Medicare Other

## 2019-10-27 ENCOUNTER — Other Ambulatory Visit: Payer: Self-pay

## 2019-10-27 DIAGNOSIS — I483 Typical atrial flutter: Secondary | ICD-10-CM | POA: Diagnosis not present

## 2019-10-27 DIAGNOSIS — Z5181 Encounter for therapeutic drug level monitoring: Secondary | ICD-10-CM

## 2019-10-27 DIAGNOSIS — Z7901 Long term (current) use of anticoagulants: Secondary | ICD-10-CM

## 2019-10-27 LAB — POCT INR: INR: 4.5 — AB (ref 2.0–3.0)

## 2019-10-27 NOTE — Patient Instructions (Signed)
Hold today and tomorrow and then continue taking 1 tablet daily except for 1/2 tablet on Tuesdays and Saturdays.  Recheck INR in 2 weeks. [facility] [phone]

## 2019-11-10 ENCOUNTER — Other Ambulatory Visit: Payer: Self-pay

## 2019-11-10 ENCOUNTER — Ambulatory Visit (INDEPENDENT_AMBULATORY_CARE_PROVIDER_SITE_OTHER): Payer: Medicare Other

## 2019-11-10 DIAGNOSIS — I483 Typical atrial flutter: Secondary | ICD-10-CM

## 2019-11-10 DIAGNOSIS — Z7901 Long term (current) use of anticoagulants: Secondary | ICD-10-CM

## 2019-11-10 LAB — POCT INR: INR: 3.5 — AB (ref 2.0–3.0)

## 2019-11-10 NOTE — Patient Instructions (Signed)
Description   Skip today's dosage of Warfarin, then start taking 1 tablet daily except for 1/2 tablet on Tuesdays, Thursdays and Saturdays.  Recheck INR in 3 weeks. [facility] [phone]

## 2019-12-04 DIAGNOSIS — I5020 Unspecified systolic (congestive) heart failure: Secondary | ICD-10-CM | POA: Diagnosis not present

## 2019-12-04 DIAGNOSIS — E1169 Type 2 diabetes mellitus with other specified complication: Secondary | ICD-10-CM | POA: Diagnosis not present

## 2019-12-04 DIAGNOSIS — I10 Essential (primary) hypertension: Secondary | ICD-10-CM | POA: Diagnosis not present

## 2019-12-04 DIAGNOSIS — E7800 Pure hypercholesterolemia, unspecified: Secondary | ICD-10-CM | POA: Diagnosis not present

## 2019-12-04 DIAGNOSIS — N400 Enlarged prostate without lower urinary tract symptoms: Secondary | ICD-10-CM | POA: Diagnosis not present

## 2019-12-14 ENCOUNTER — Telehealth: Payer: Self-pay | Admitting: Cardiology

## 2019-12-14 ENCOUNTER — Other Ambulatory Visit: Payer: Self-pay | Admitting: Cardiology

## 2019-12-14 NOTE — Telephone Encounter (Signed)
Pt c/o medication issue:  1. Name of Medication: furosemide (LASIX) 40 MG tablet  2. How are you currently taking this medication (dosage and times per day)? As needed  3. Are you having a reaction (difficulty breathing--STAT)? no  4. What is your medication issue? Katelyn from Dr. Carlyle Lipa office calling to clarify how the patient is to take the medication. She states they have it that he is to take 1 tablet daily and an extra tablet as needed, but the patient is taking the medication as needed.

## 2019-12-14 NOTE — Telephone Encounter (Signed)
Called back and left message for Carlos Gibson.   01/13/2019 at [facility] with Dr Marlou Porch - pt reported taking Furosemide 40 mg two tablets daily. Pt had an appt with Dr Felipa Eth 10/15 - I can not see that documentation to know if a change was made at that time however when pt returned to see Dr Curt Bears 01/24/2019 he reported taking 40 mg daily as needed.  Requested Katelyn call back if further questions however Dr Marlou Porch has not changed pt's Furosemide dose.

## 2019-12-14 NOTE — Telephone Encounter (Signed)
*  STAT* If patient is at the pharmacy, call can be transferred to refill team.   1. Which medications need to be refilled? (please list name of each medication and dose if known) warfarin (COUMADIN) 5 MG tablet  2. Which pharmacy/location (including street and city if local pharmacy) is medication to be sent to? [facility] - [place], [place] - [place] [address]  3. Do they need a 30 day or 90 day supply? 30 day

## 2019-12-14 NOTE — Telephone Encounter (Signed)
Pt is overdue for follow-up, cancelled appt 12/01/19 and No Showed for appt on 12/08/19.  Attempted to call pt to reschedule missed appt for refill, NA and voicemail full, unable to leave message.

## 2019-12-15 ENCOUNTER — Ambulatory Visit (INDEPENDENT_AMBULATORY_CARE_PROVIDER_SITE_OTHER): Payer: Medicare Other | Admitting: *Deleted

## 2019-12-15 ENCOUNTER — Other Ambulatory Visit: Payer: Self-pay

## 2019-12-15 DIAGNOSIS — Z5181 Encounter for therapeutic drug level monitoring: Secondary | ICD-10-CM

## 2019-12-15 DIAGNOSIS — I483 Typical atrial flutter: Secondary | ICD-10-CM

## 2019-12-15 DIAGNOSIS — Z7901 Long term (current) use of anticoagulants: Secondary | ICD-10-CM

## 2019-12-15 LAB — POCT INR: INR: 2 (ref 2.0–3.0)

## 2019-12-15 MED ORDER — WARFARIN SODIUM 5 MG PO TABS: ORAL_TABLET | ORAL | 0 refills | Status: DC

## 2019-12-15 NOTE — Patient Instructions (Signed)
Description   Continue taking 1 tablet daily except for 1/2 tablet on Tuesdays, Thursdays and Saturdays.  Recheck INR in 4 weeks. [facility]in Clinic [phone]

## 2019-12-15 NOTE — Telephone Encounter (Signed)
Attempted to call pt to schedule OV, NA and voicemail full unable to leave message to call for appt.  Pt is overdue for follow-up.

## 2019-12-18 DIAGNOSIS — E1169 Type 2 diabetes mellitus with other specified complication: Secondary | ICD-10-CM | POA: Diagnosis not present

## 2019-12-18 DIAGNOSIS — I351 Nonrheumatic aortic (valve) insufficiency: Secondary | ICD-10-CM | POA: Diagnosis not present

## 2019-12-18 DIAGNOSIS — D6869 Other thrombophilia: Secondary | ICD-10-CM | POA: Diagnosis not present

## 2019-12-18 DIAGNOSIS — D696 Thrombocytopenia, unspecified: Secondary | ICD-10-CM | POA: Diagnosis not present

## 2019-12-18 DIAGNOSIS — I5020 Unspecified systolic (congestive) heart failure: Secondary | ICD-10-CM | POA: Diagnosis not present

## 2019-12-18 DIAGNOSIS — I10 Essential (primary) hypertension: Secondary | ICD-10-CM | POA: Diagnosis not present

## 2019-12-18 DIAGNOSIS — E7800 Pure hypercholesterolemia, unspecified: Secondary | ICD-10-CM | POA: Diagnosis not present

## 2019-12-18 DIAGNOSIS — Z0000 Encounter for general adult medical examination without abnormal findings: Secondary | ICD-10-CM | POA: Diagnosis not present

## 2019-12-18 DIAGNOSIS — Z7984 Long term (current) use of oral hypoglycemic drugs: Secondary | ICD-10-CM | POA: Diagnosis not present

## 2019-12-18 DIAGNOSIS — Z1389 Encounter for screening for other disorder: Secondary | ICD-10-CM | POA: Diagnosis not present

## 2019-12-18 DIAGNOSIS — Z23 Encounter for immunization: Secondary | ICD-10-CM | POA: Diagnosis not present

## 2019-12-18 DIAGNOSIS — I483 Typical atrial flutter: Secondary | ICD-10-CM | POA: Diagnosis not present

## 2019-12-18 DIAGNOSIS — I429 Cardiomyopathy, unspecified: Secondary | ICD-10-CM | POA: Diagnosis not present

## 2019-12-19 ENCOUNTER — Other Ambulatory Visit: Payer: Self-pay

## 2019-12-19 ENCOUNTER — Encounter: Payer: Self-pay | Admitting: Student

## 2019-12-19 ENCOUNTER — Ambulatory Visit (INDEPENDENT_AMBULATORY_CARE_PROVIDER_SITE_OTHER): Payer: Medicare Other | Admitting: Student

## 2019-12-19 VITALS — BP 144/64 | HR 70 | Ht 71.0 in | Wt 187.4 lb

## 2019-12-19 DIAGNOSIS — N1832 Chronic kidney disease, stage 3b: Secondary | ICD-10-CM

## 2019-12-19 DIAGNOSIS — I5022 Chronic systolic (congestive) heart failure: Secondary | ICD-10-CM | POA: Diagnosis not present

## 2019-12-19 DIAGNOSIS — I472 Ventricular tachycardia, unspecified: Secondary | ICD-10-CM

## 2019-12-19 DIAGNOSIS — I480 Paroxysmal atrial fibrillation: Secondary | ICD-10-CM

## 2019-12-19 DIAGNOSIS — Z5181 Encounter for therapeutic drug level monitoring: Secondary | ICD-10-CM | POA: Diagnosis not present

## 2019-12-19 LAB — CUP PACEART INCLINIC DEVICE CHECK
Battery Remaining Longevity: 46 mo
Battery Voltage: 2.95 V
Brady Statistic AP VP Percent: 96.28 %
Brady Statistic AP VS Percent: 0.03 %
Brady Statistic AS VP Percent: 3.67 %
Brady Statistic AS VS Percent: 0.02 %
Brady Statistic RA Percent Paced: 96.07 %
Brady Statistic RV Percent Paced: 99.58 %
Date Time Interrogation Session: 20210914131146
HighPow Impedance: 64 Ohm
Implantable Lead Implant Date: 20191023
Implantable Lead Implant Date: 20191023
Implantable Lead Implant Date: 20191023
Implantable Lead Location: 753858
Implantable Lead Location: 753859
Implantable Lead Location: 753860
Implantable Lead Model: 4598
Implantable Lead Model: 5076
Implantable Lead Model: 6935
Implantable Pulse Generator Implant Date: 20191023
Lead Channel Impedance Value: 172.541
Lead Channel Impedance Value: 179.282
Lead Channel Impedance Value: 190.884
Lead Channel Impedance Value: 224.438
Lead Channel Impedance Value: 235.98 Ohm
Lead Channel Impedance Value: 304 Ohm
Lead Channel Impedance Value: 342 Ohm
Lead Channel Impedance Value: 380 Ohm
Lead Channel Impedance Value: 399 Ohm
Lead Channel Impedance Value: 437 Ohm
Lead Channel Impedance Value: 456 Ohm
Lead Channel Impedance Value: 513 Ohm
Lead Channel Impedance Value: 532 Ohm
Lead Channel Impedance Value: 646 Ohm
Lead Channel Impedance Value: 703 Ohm
Lead Channel Impedance Value: 722 Ohm
Lead Channel Impedance Value: 817 Ohm
Lead Channel Impedance Value: 836 Ohm
Lead Channel Pacing Threshold Amplitude: 0.5 V
Lead Channel Pacing Threshold Amplitude: 1.125 V
Lead Channel Pacing Threshold Amplitude: 2.75 V
Lead Channel Pacing Threshold Pulse Width: 0.4 ms
Lead Channel Pacing Threshold Pulse Width: 0.4 ms
Lead Channel Pacing Threshold Pulse Width: 0.8 ms
Lead Channel Sensing Intrinsic Amplitude: 14.375 mV
Lead Channel Sensing Intrinsic Amplitude: 2.875 mV
Lead Channel Sensing Intrinsic Amplitude: 2.875 mV
Lead Channel Sensing Intrinsic Amplitude: 9 mV
Lead Channel Setting Pacing Amplitude: 1.5 V
Lead Channel Setting Pacing Amplitude: 2.25 V
Lead Channel Setting Pacing Amplitude: 3.25 V
Lead Channel Setting Pacing Pulse Width: 0.4 ms
Lead Channel Setting Pacing Pulse Width: 0.8 ms
Lead Channel Setting Sensing Sensitivity: 0.3 mV

## 2019-12-19 NOTE — Progress Notes (Signed)
Electrophysiology Office Note Date: 12/19/2019  ID:  Carlos Gibson, DOB [birth year], MRN [id number]  PCP: [name], MD Primary Cardiologist: [name], MD Electrophysiologist: [name], MD   CC: Routine ICD follow-up  Carlos Gibson is a 79 y.o. male seen today for [name], MD for acute visit due to questions about renal function by request of Dr. Felipa Eth.  Since last being seen in our clinic the patient reports doing well overall.  he denies chest pain, palpitations, dyspnea, PND, orthopnea, nausea, vomiting, dizziness, syncope, edema, weight gain, or early satiety. He has not had ICD shocks.   Device History: Medtronic BiV ICD implanted 01/2018 for chronic systolic CHF History of appropriate therapy: Yes History of AAD therapy: Yes; currently on amiodarone   Past Medical History:  Diagnosis Date  . Aortic regurgitation    a. mild AI by echo 01/2016.  [name] Aortic root dilation (HCC)   . Aortic valve regurgitation   . Bifascicular block   . Chronic systolic CHF (congestive heart failure) ([place])   . CKD (chronic kidney disease), stage III 01/01/2016  . Diabetes ([place])   . Ejection fraction < 50%   . Frequent PVCs   . Gilbert's syndrome   . Hyperlipidemia   . Mild diastolic dysfunction   . Mitral regurgitation    a. mod by echo 10/207.  [name] NICM (nonischemic cardiomyopathy) ([place])   . Pericardial effusion    a. small by echo 01/2016.  . Pulmonary hypertension ([place])   . Right BBB/left post fasc block   . Thrombocytopenia ([place])    Past Surgical History:  Procedure Laterality Date  . BIV ICD INSERTION CRT-D N/A 01/26/2018   Procedure: BIV ICD INSERTION CRT-D;  Surgeon: Constance Haw, MD;  Location: [facility];  Service: Cardiovascular;  Laterality: N/A;  . CARDIAC CATHETERIZATION N/A 12/20/2014   Procedure: Right/Left Heart Cath and Coronary Angiography;  Surgeon: Jerline Pain, MD;  Location: [facility];  Service:  Cardiovascular;  Laterality: N/A;  . CARDIOVERSION N/A 10/24/2018   Procedure: CARDIOVERSION;  Surgeon: Thayer Headings, MD;  Location: [facility];  Service: Cardiovascular;  Laterality: N/A;  . HERNIA REPAIR  06/2017    Current Outpatient Medications  Medication Sig Dispense Refill  . acetaminophen (TYLENOL) 650 MG CR tablet Take 650 mg by mouth every 8 (eight) hours as needed for pain.    [name] alfuzosin (UROXATRAL) 10 MG 24 hr tablet Take 10 mg by mouth at bedtime.   3  . amiodarone (PACERONE) 200 MG tablet TAKE 1 TABLET(200 MG) BY MOUTH DAILY 90 tablet 3  . atorvastatin (LIPITOR) 10 MG tablet Take 10 mg by mouth at bedtime.    . bisoprolol (ZEBETA) 5 MG tablet Take 5 mg by mouth daily.    . Cholecalciferol (VITAMIN D3) 25 MCG (1000 UT) CAPS Take 1,000 Units by mouth every other day.     . finasteride (PROSCAR) 5 MG tablet Take 5 mg by mouth daily.   2  . furosemide (LASIX) 40 MG tablet Take 40 mg by mouth as needed for fluid or edema.    [name] glimepiride (AMARYL) 2 MG tablet Take 2 mg by mouth at bedtime.     [name] losartan (COZAAR) 25 MG tablet TAKE 1 TABLET(25 MG) BY MOUTH DAILY 90 tablet 3  . potassium chloride (KLOR-CON) 10 MEQ tablet Take 1 tablet (10 mEq total) by mouth daily. 90 tablet 2  . spironolactone (ALDACTONE) 25 MG tablet TAKE  1/2 TABLET(12.5 MG) BY MOUTH DAILY 45 tablet 3  . traMADol (ULTRAM) 50 MG tablet Take by mouth every 6 (six) hours as needed.    . warfarin (COUMADIN) 5 MG tablet Take 1/2 a tablet to 1 tablet by mouth daily as directed by the coumadin clinic 30 tablet 0   No current facility-administered medications for this visit.    Allergies:   Ace inhibitors, Coreg [carvedilol], and Toprol xl [metoprolol tartrate]   Social History: Social History   Socioeconomic History  . Marital status: Married    Spouse name: Not on file  . Number of children: Not on file  . Years of education: Not on file  . Highest education level: Not on file  Occupational History  .  Not on file  Tobacco Use  . Smoking status: Never Smoker  . Smokeless tobacco: Never Used  Vaping Use  . Vaping Use: Never used  Substance and Sexual Activity  . Alcohol use: No    Alcohol/week: 0.0 standard drinks  . Drug use: No  . Sexual activity: Not on file  Other Topics Concern  . Not on file  Social History Narrative  . Not on file   Social Determinants of Health   Financial Resource Strain:   . Difficulty of Paying Living Expenses: Not on file  Food Insecurity:   . Worried About Charity fundraiser in the Last Year: Not on file  . Ran Out of Food in the Last Year: Not on file  Transportation Needs:   . Lack of Transportation (Medical): Not on file  . Lack of Transportation (Non-Medical): Not on file  Physical Activity:   . Days of Exercise per Week: Not on file  . Minutes of Exercise per Session: Not on file  Stress:   . Feeling of Stress : Not on file  Social Connections:   . Frequency of Communication with Friends and Family: Not on file  . Frequency of Social Gatherings with Friends and Family: Not on file  . Attends Religious Services: Not on file  . Active Member of Clubs or Organizations: Not on file  . Attends Archivist Meetings: Not on file  . Marital Status: Not on file  Intimate Partner Violence:   . Fear of Current or Ex-Partner: Not on file  . Emotionally Abused: Not on file  . Physically Abused: Not on file  . Sexually Abused: Not on file    Family History: Family History  Problem Relation Age of Onset  . Hypertension Mother   . Stroke Mother   . Irregular heart beat Father   . Unexplained death Father     Review of Systems: All other systems reviewed and are otherwise negative except as noted above.   Physical Exam: Vitals:   12/19/19 1033  BP: (!) 144/64  Pulse: 70  SpO2: 96%  Weight: 187 lb 6.4 oz (85 kg)  Height: 5\' 11"  (1.803 m)     GEN- The patient is well appearing, alert and oriented x 3 today.   HEENT:  normocephalic, atraumatic; sclera clear, conjunctiva pink; hearing intact; oropharynx clear; neck supple, no JVP Lymph- no cervical lymphadenopathy Lungs- Clear to ausculation bilaterally, normal work of breathing.  No wheezes, rales, rhonchi Heart- Regular rate and rhythm, no murmurs, rubs or gallops, PMI not laterally displaced GI- soft, non-tender, non-distended, bowel sounds present, no hepatosplenomegaly Extremities- no clubbing or cyanosis. No edema; DP/PT/radial pulses 2+ bilaterally MS- no significant deformity or atrophy Skin- warm and dry,  no rash or lesion; ICD pocket well healed Psych- euthymic mood, full affect Neuro- strength and sensation are intact  ICD interrogation- reviewed in detail today,  See PACEART report  EKG:  EKG is not ordered today.  Recent Labs: 04/12/2019: Magnesium 2.2 08/18/2019: ALT 15; BUN 24; Creatinine, Ser 2.15; Hemoglobin 13.5; Platelets 130; Potassium 4.2; Sodium 141; TSH 7.730   Wt Readings from Last 3 Encounters:  12/19/19 187 lb 6.4 oz (85 kg)  08/18/19 178 lb (80.7 kg)  04/27/19 178 lb (80.7 kg)     Other studies Reviewed: Additional studies/ records that were reviewed today include: Previous labs, previous EP office notes.    Assessment and Plan:  1.  Chronic systolic dysfunction s/p Medtronic CRT-D  euvolemic today Stable on an appropriate medical regimen Normal ICD function See Pace Art report LV threshold slightly increased. Has been up and down in the past. Changed to 3.25V @ 0.8 ms to maintain 0.5 V safety margin. Minimal effect on longevity.  Labwork 9/13 at Dr. Carlyle Lipa office showed Cr of 2.18 and K of 4.8, stable from 08/2019, though increased overall.   2. CKD III Creatine gradually up over the last year.  He is currently stable on his spironolactone and losartan.  If creatinine continues to trend up, could consider Bidil instead of losartan, and may need to stop spironolactone.  Consider referral to nephrology if  worsens.   3. HTN Stable overall, The highest he gets at home is in the 140s. Would allow this currently while we follow his renal function closely.   4. Ventricular tachycardia Quiescent on amiodarone.    Current medicines are reviewed at length with the patient today.   The patient does not have concerns regarding his medicines.  The following changes were made today:  none  Labs/ tests ordered today include:  No orders of the defined types were placed in this encounter.  Disposition: Follow up with Dr. Marlou Porch as scheduled in November.  Follow up with EP APP  6 months  Signed, Shirley Friar, PA-C  12/19/2019 1:23 PM  [place] [address] [phone] (office) [phone] (fax)

## 2019-12-19 NOTE — Patient Instructions (Signed)
Medication Instructions:  *If you need a refill on your cardiac medications before your next appointment, please call your pharmacy*  Follow-Up: At [facility], you and your health needs are our priority.  As part of our continuing mission to provide you with exceptional heart care, we have created designated Provider Care Teams.  These Care Teams include your primary Cardiologist (physician) and Advanced Practice Providers (APPs -  Physician Assistants and Nurse Practitioners) who all work together to provide you with the care you need, when you need it.  We recommend signing up for the patient portal called "MyChart".  Sign up information is provided on this After Visit Summary.  MyChart is used to connect with patients for Virtual Visits (Telemedicine).  Patients are able to view lab/test results, encounter notes, upcoming appointments, etc.  Non-urgent messages can be sent to your provider as well.   To learn more about what you can do with MyChart, go to [email or web address].    Your next appointment:   Your physician recommends that you schedule a follow-up appointment in: 2 MONTHS with Dr. Marlou Porch  Your physician wants you to follow-up in: 6 MONTHS with Oda Kilts, PA-C. You will receive a reminder letter in the mail two months in advance. If you don't receive a letter, please call our office to schedule the follow-up appointment.  The format for your next appointment:   In Person with [name], MD [facility]" [place], [place]

## 2019-12-20 NOTE — Telephone Encounter (Signed)
Carlos Gibson has not called back.  Will close this encounter for now and await a return call if questions.

## 2019-12-28 DIAGNOSIS — I5020 Unspecified systolic (congestive) heart failure: Secondary | ICD-10-CM | POA: Diagnosis not present

## 2019-12-28 DIAGNOSIS — E7800 Pure hypercholesterolemia, unspecified: Secondary | ICD-10-CM | POA: Diagnosis not present

## 2019-12-28 DIAGNOSIS — I10 Essential (primary) hypertension: Secondary | ICD-10-CM | POA: Diagnosis not present

## 2019-12-28 DIAGNOSIS — E1169 Type 2 diabetes mellitus with other specified complication: Secondary | ICD-10-CM | POA: Diagnosis not present

## 2019-12-28 DIAGNOSIS — N400 Enlarged prostate without lower urinary tract symptoms: Secondary | ICD-10-CM | POA: Diagnosis not present

## 2020-01-02 ENCOUNTER — Other Ambulatory Visit: Payer: Self-pay | Admitting: Physician Assistant

## 2020-01-02 ENCOUNTER — Ambulatory Visit (INDEPENDENT_AMBULATORY_CARE_PROVIDER_SITE_OTHER): Payer: Medicare Other | Admitting: Emergency Medicine

## 2020-01-02 DIAGNOSIS — I428 Other cardiomyopathies: Secondary | ICD-10-CM | POA: Diagnosis not present

## 2020-01-02 LAB — CUP PACEART REMOTE DEVICE CHECK
Battery Remaining Longevity: 43 mo
Battery Voltage: 2.96 V
Brady Statistic AP VP Percent: 98.88 %
Brady Statistic AP VS Percent: 0.02 %
Brady Statistic AS VP Percent: 1.09 %
Brady Statistic AS VS Percent: 0.01 %
Brady Statistic RA Percent Paced: 98.82 %
Brady Statistic RV Percent Paced: 99.62 %
Date Time Interrogation Session: 20210928033425
HighPow Impedance: 67 Ohm
Implantable Lead Implant Date: 20191023
Implantable Lead Implant Date: 20191023
Implantable Lead Implant Date: 20191023
Implantable Lead Location: 753858
Implantable Lead Location: 753859
Implantable Lead Location: 753860
Implantable Lead Model: 4598
Implantable Lead Model: 5076
Implantable Lead Model: 6935
Implantable Pulse Generator Implant Date: 20191023
Lead Channel Impedance Value: 168.889
Lead Channel Impedance Value: 168.889
Lead Channel Impedance Value: 182.4 Ohm
Lead Channel Impedance Value: 207.273
Lead Channel Impedance Value: 207.273
Lead Channel Impedance Value: 304 Ohm
Lead Channel Impedance Value: 323 Ohm
Lead Channel Impedance Value: 380 Ohm
Lead Channel Impedance Value: 380 Ohm
Lead Channel Impedance Value: 399 Ohm
Lead Channel Impedance Value: 437 Ohm
Lead Channel Impedance Value: 456 Ohm
Lead Channel Impedance Value: 513 Ohm
Lead Channel Impedance Value: 570 Ohm
Lead Channel Impedance Value: 570 Ohm
Lead Channel Impedance Value: 646 Ohm
Lead Channel Impedance Value: 703 Ohm
Lead Channel Impedance Value: 722 Ohm
Lead Channel Pacing Threshold Amplitude: 0.5 V
Lead Channel Pacing Threshold Amplitude: 1.125 V
Lead Channel Pacing Threshold Amplitude: 3 V
Lead Channel Pacing Threshold Pulse Width: 0.4 ms
Lead Channel Pacing Threshold Pulse Width: 0.4 ms
Lead Channel Pacing Threshold Pulse Width: 0.8 ms
Lead Channel Sensing Intrinsic Amplitude: 14.5 mV
Lead Channel Sensing Intrinsic Amplitude: 14.5 mV
Lead Channel Sensing Intrinsic Amplitude: 3 mV
Lead Channel Sensing Intrinsic Amplitude: 3 mV
Lead Channel Setting Pacing Amplitude: 1.5 V
Lead Channel Setting Pacing Amplitude: 2.25 V
Lead Channel Setting Pacing Amplitude: 3.25 V
Lead Channel Setting Pacing Pulse Width: 0.4 ms
Lead Channel Setting Pacing Pulse Width: 0.8 ms
Lead Channel Setting Sensing Sensitivity: 0.3 mV

## 2020-01-04 NOTE — Progress Notes (Signed)
Remote ICD transmission.   

## 2020-01-11 ENCOUNTER — Ambulatory Visit (INDEPENDENT_AMBULATORY_CARE_PROVIDER_SITE_OTHER): Payer: Medicare Other | Admitting: *Deleted

## 2020-01-11 ENCOUNTER — Other Ambulatory Visit: Payer: Self-pay

## 2020-01-11 DIAGNOSIS — I483 Typical atrial flutter: Secondary | ICD-10-CM

## 2020-01-11 DIAGNOSIS — Z5181 Encounter for therapeutic drug level monitoring: Secondary | ICD-10-CM

## 2020-01-11 DIAGNOSIS — Z7901 Long term (current) use of anticoagulants: Secondary | ICD-10-CM | POA: Diagnosis not present

## 2020-01-11 LAB — POCT INR: INR: 2.7 (ref 2.0–3.0)

## 2020-01-11 NOTE — Patient Instructions (Addendum)
Description   Continue taking 1 tablet daily except for 1/2 tablet on Tuesdays, Thursdays and Saturdays.  Recheck INR in 5 weeks. Coumadin Clinic [phone]

## 2020-01-12 ENCOUNTER — Telehealth: Payer: Self-pay | Admitting: Cardiology

## 2020-01-12 MED ORDER — WARFARIN SODIUM 5 MG PO TABS: ORAL_TABLET | ORAL | 2 refills | Status: DC

## 2020-01-12 NOTE — Telephone Encounter (Signed)
*  STAT* If patient is at the pharmacy, call can be transferred to refill team.   1. Which medications need to be refilled? (please list name of each medication and dose if known) warfarin (COUMADIN) 5 MG tablet  2. Which pharmacy/location (including street and city if local pharmacy) is medication to be sent to? [facility] - [place], [place] - [place] [address]  3. Do they need a 30 day or 90 day supply? 30 day with refills.

## 2020-01-31 DIAGNOSIS — E1169 Type 2 diabetes mellitus with other specified complication: Secondary | ICD-10-CM | POA: Diagnosis not present

## 2020-01-31 DIAGNOSIS — I5020 Unspecified systolic (congestive) heart failure: Secondary | ICD-10-CM | POA: Diagnosis not present

## 2020-01-31 DIAGNOSIS — I10 Essential (primary) hypertension: Secondary | ICD-10-CM | POA: Diagnosis not present

## 2020-01-31 DIAGNOSIS — N400 Enlarged prostate without lower urinary tract symptoms: Secondary | ICD-10-CM | POA: Diagnosis not present

## 2020-01-31 DIAGNOSIS — E7800 Pure hypercholesterolemia, unspecified: Secondary | ICD-10-CM | POA: Diagnosis not present

## 2020-02-01 ENCOUNTER — Other Ambulatory Visit: Payer: Self-pay | Admitting: Cardiology

## 2020-02-06 DIAGNOSIS — Z23 Encounter for immunization: Secondary | ICD-10-CM | POA: Diagnosis not present

## 2020-02-15 ENCOUNTER — Ambulatory Visit: Payer: Medicare Other | Admitting: Cardiology

## 2020-02-16 ENCOUNTER — Ambulatory Visit (INDEPENDENT_AMBULATORY_CARE_PROVIDER_SITE_OTHER): Payer: Medicare Other | Admitting: Cardiology

## 2020-02-16 ENCOUNTER — Telehealth: Payer: Self-pay | Admitting: Cardiology

## 2020-02-16 ENCOUNTER — Other Ambulatory Visit: Payer: Self-pay

## 2020-02-16 ENCOUNTER — Ambulatory Visit (INDEPENDENT_AMBULATORY_CARE_PROVIDER_SITE_OTHER): Payer: Medicare Other | Admitting: *Deleted

## 2020-02-16 ENCOUNTER — Encounter: Payer: Self-pay | Admitting: Cardiology

## 2020-02-16 VITALS — BP 114/57 | HR 69 | Ht 71.0 in | Wt 186.0 lb

## 2020-02-16 DIAGNOSIS — Z7901 Long term (current) use of anticoagulants: Secondary | ICD-10-CM

## 2020-02-16 DIAGNOSIS — I472 Ventricular tachycardia, unspecified: Secondary | ICD-10-CM

## 2020-02-16 DIAGNOSIS — I5022 Chronic systolic (congestive) heart failure: Secondary | ICD-10-CM | POA: Diagnosis not present

## 2020-02-16 DIAGNOSIS — I483 Typical atrial flutter: Secondary | ICD-10-CM

## 2020-02-16 DIAGNOSIS — I428 Other cardiomyopathies: Secondary | ICD-10-CM | POA: Diagnosis not present

## 2020-02-16 DIAGNOSIS — E7800 Pure hypercholesterolemia, unspecified: Secondary | ICD-10-CM | POA: Diagnosis not present

## 2020-02-16 DIAGNOSIS — Z5181 Encounter for therapeutic drug level monitoring: Secondary | ICD-10-CM

## 2020-02-16 DIAGNOSIS — I10 Essential (primary) hypertension: Secondary | ICD-10-CM | POA: Diagnosis not present

## 2020-02-16 DIAGNOSIS — N400 Enlarged prostate without lower urinary tract symptoms: Secondary | ICD-10-CM | POA: Diagnosis not present

## 2020-02-16 DIAGNOSIS — E1169 Type 2 diabetes mellitus with other specified complication: Secondary | ICD-10-CM | POA: Diagnosis not present

## 2020-02-16 DIAGNOSIS — I5020 Unspecified systolic (congestive) heart failure: Secondary | ICD-10-CM | POA: Diagnosis not present

## 2020-02-16 LAB — POCT INR: INR: 5.4 — AB (ref 2.0–3.0)

## 2020-02-16 NOTE — Patient Instructions (Addendum)
Description   Hold warfarin 11/12, 11/13 and 11/14 then start taking 1/2 a tablet daily except for 1 tablet on Monday  Wednesday and Fridays. Recheck INR in 2 weeks. Coumadin Clinic [phone]

## 2020-02-16 NOTE — Patient Instructions (Signed)

## 2020-02-16 NOTE — Telephone Encounter (Signed)
*  STAT* If patient is at the pharmacy, call can be transferred to refill team.   1. Which medications need to be refilled? (please list name of each medication and dose if known) spironolactone (ALDACTONE) 25 MG tablet  2. Which pharmacy/location (including street and city if local pharmacy) is medication to be sent to? [facility] - [place], [place] - [place] [address]  3. Do they need a 30 day or 90 day supply? 28    Per [facility] this is an urgent request.

## 2020-02-16 NOTE — Telephone Encounter (Signed)
Patient has active refills at [facility]. I called [facility] and spoke with pharmacist Herbie Baltimore and gave a verbal refill authorization.

## 2020-02-16 NOTE — Progress Notes (Signed)
Cardiology Office Note:    Date:  02/16/2020   ID:  Carlos Gibson, DOB 1940/11/29, MRN [id number]  PCP:  Carlos Manes, MD  [facility] Cardiologist:  Carlos Furbish, MD  [facility] Electrophysiologist:  Carlos Meredith Leeds, MD   Referring MD: Carlos Manes, MD     History of Present Illness:    Carlos Gibson is a 79 y.o. male here for the follow-up of Medtronic BiV ICD implanted 01/2018.  Chronic systolic heart failure. On amiodarone.  Past Medical History:  Diagnosis Date  . Aortic regurgitation    a. mild AI by echo 01/2016.  [name] Aortic root dilation (HCC)   . Aortic valve regurgitation   . Bifascicular block   . Chronic systolic CHF (congestive heart failure) ([place])   . CKD (chronic kidney disease), stage III ([place]) 01/01/2016  . Diabetes ([place])   . Ejection fraction < 50%   . Frequent PVCs   . Gilbert's syndrome   . Hyperlipidemia   . Mild diastolic dysfunction   . Mitral regurgitation    a. mod by echo 10/207.  [name] NICM (nonischemic cardiomyopathy) ([place])   . Pericardial effusion    a. small by echo 01/2016.  . Pulmonary hypertension ([place])   . Right BBB/left post fasc block   . Thrombocytopenia ([place])     Past Surgical History:  Procedure Laterality Date  . BIV ICD INSERTION CRT-D N/A 01/26/2018   Procedure: BIV ICD INSERTION CRT-D;  Surgeon: Constance Haw, MD;  Location: [place] CV LAB;  Service: Cardiovascular;  Laterality: N/A;  . CARDIAC CATHETERIZATION N/A 12/20/2014   Procedure: Right/Left Heart Cath and Coronary Angiography;  Surgeon: Jerline Pain, MD;  Location: [place] CV LAB;  Service: Cardiovascular;  Laterality: N/A;  . CARDIOVERSION N/A 10/24/2018   Procedure: CARDIOVERSION;  Surgeon: Thayer Headings, MD;  Location: [facility] ENDOSCOPY;  Service: Cardiovascular;  Laterality: N/A;  . HERNIA REPAIR  06/2017    Current Medications: Current Meds  Medication Sig  . acetaminophen (TYLENOL) 650 MG CR tablet Take 650 mg by mouth every 8 (eight)  hours as needed for pain.  [name] alfuzosin (UROXATRAL) 10 MG 24 hr tablet Take 10 mg by mouth at bedtime.   [name] amiodarone (PACERONE) 200 MG tablet TAKE 1 TABLET(200 MG) BY MOUTH DAILY  . atorvastatin (LIPITOR) 10 MG tablet Take 10 mg by mouth at bedtime.  . bisoprolol (ZEBETA) 5 MG tablet Take 5 mg by mouth daily.  . Cholecalciferol (VITAMIN D3) 25 MCG (1000 UT) CAPS Take 1,000 Units by mouth every other day.   . finasteride (PROSCAR) 5 MG tablet Take 5 mg by mouth daily.   . furosemide (LASIX) 40 MG tablet Take 40 mg by mouth as needed for fluid or edema.  [name] glimepiride (AMARYL) 2 MG tablet Take 2 mg by mouth at bedtime.   [name] losartan (COZAAR) 25 MG tablet TAKE 1 TABLET(25 MG) BY MOUTH DAILY  . potassium chloride (KLOR-CON) 10 MEQ tablet Take 1 tablet (10 mEq total) by mouth daily.  [name] spironolactone (ALDACTONE) 25 MG tablet TAKE 1/2 TABLET(12.5 MG) BY MOUTH DAILY  . traMADol (ULTRAM) 50 MG tablet Take by mouth every 6 (six) hours as needed.  . warfarin (COUMADIN) 5 MG tablet Take 1/2 a tablet to 1 tablet by mouth daily as directed by the coumadin clinic     Allergies:   Ace inhibitors, Coreg [carvedilol], and Toprol xl [metoprolol tartrate]   Social History   Socioeconomic History  . Marital  status: Married    Spouse name: Not on file  . Number of children: Not on file  . Years of education: Not on file  . Highest education level: Not on file  Occupational History  . Not on file  Tobacco Use  . Smoking status: Never Smoker  . Smokeless tobacco: Never Used  Vaping Use  . Vaping Use: Never used  Substance and Sexual Activity  . Alcohol use: No    Alcohol/week: 0.0 standard drinks  . Drug use: No  . Sexual activity: Not on file  Other Topics Concern  . Not on file  Social History Narrative  . Not on file   Social Determinants of Health   Financial Resource Strain:   . Difficulty of Paying Living Expenses: Not on file  Food Insecurity:   . Worried About Charity fundraiser in  the Last Year: Not on file  . Ran Out of Food in the Last Year: Not on file  Transportation Needs:   . Lack of Transportation (Medical): Not on file  . Lack of Transportation (Non-Medical): Not on file  Physical Activity:   . Days of Exercise per Week: Not on file  . Minutes of Exercise per Session: Not on file  Stress:   . Feeling of Stress : Not on file  Social Connections:   . Frequency of Communication with Friends and Family: Not on file  . Frequency of Social Gatherings with Friends and Family: Not on file  . Attends Religious Services: Not on file  . Active Member of Clubs or Organizations: Not on file  . Attends Archivist Meetings: Not on file  . Marital Status: Not on file     Family History: The patient's family history includes Hypertension in his mother; Irregular heart beat in his father; Stroke in his mother; Unexplained death in his father.  ROS:   Please see the history of present illness.     All other systems reviewed and are negative.  EKGs/Labs/Other Studies Reviewed:    The following studies were reviewed today:  Echocardiogram 04/29/2017: - Left ventricle: The cavity size was severely dilated. There was  mild concentric hypertrophy. Systolic function was severely  reduced. The estimated ejection fraction was in the range of 25%  to 30%. Severe diffuse hypokinesis with regional variations.  - Aortic valve: There was moderate regurgitation directed  eccentrically in the LVOT and towards the mitral anterior  leaflet. Regurgitation pressure half-time: 300 ms.  - Aorta: Aortic root dimension: 40 mm (ED).  - Aortic root: The aortic root was mildly dilated.  - Mitral valve: There was moderate regurgitation directed  eccentrically and posteriorly.  - Left atrium: The atrium was severely dilated.  - Right ventricle: The cavity size was moderately dilated. Wall  thickness was normal. Systolic function was moderately to  severely  reduced.  - Tricuspid valve: There was mild regurgitation.  - Pulmonic valve: There was moderate regurgitation.  - Pulmonary arteries: PA peak pressure: 38 mm Hg (S).  - Pericardium, extracardiac: A trivial, free-flowing pericardial  effusion was identified posterior to the heart and along the  right atrial free wall.   Impressions:   - The right ventricular systolic pressure was increased consistent  with mild pulmonary hypertension.   EKG: From 04/27/2019 shows AV pacing  Recent Labs: 04/12/2019: Magnesium 2.2 08/18/2019: ALT 15; BUN 24; Creatinine, Ser 2.15; Hemoglobin 13.5; Platelets 130; Potassium 4.2; Sodium 141; TSH 7.730  Recent Lipid Panel No results found for:  CHOL, TRIG, HDL, CHOLHDL, VLDL, LDLCALC, LDLDIRECT   Physical Exam:    VS:  BP (!) 114/57   Pulse 69   Ht 5\' 11"  (1.803 m)   Wt 186 lb (84.4 kg)   BMI 25.94 kg/m     Wt Readings from Last 3 Encounters:  02/16/20 186 lb (84.4 kg)  12/19/19 187 lb 6.4 oz (85 kg)  08/18/19 178 lb (80.7 kg)     GEN:  Well nourished, well developed in no acute distress HEENT: Normal NECK: No JVD; No carotid bruits LYMPHATICS: No lymphadenopathy CARDIAC: RRR, no murmurs, rubs, gallops RESPIRATORY:  Clear to auscultation without rales, wheezing or rhonchi  ABDOMEN: Soft, non-tender, non-distended MUSCULOSKELETAL:  No edema; No deformity  SKIN: Warm and dry NEUROLOGIC:  Alert and oriented x 3 PSYCHIATRIC:  Normal affect   ASSESSMENT:    1. Nonischemic cardiomyopathy ([place])   2. Chronic systolic heart failure ([place])   3. Ventricular tachycardia ([place])   4. Typical atrial flutter (HCC)    PLAN:    In order of problems listed above:  Chronic systolic heart failure -Medtronic CRT-D -Normal ICD function when reviewing Jonni Sanger Tillery's note from electrophysiology on 12/19/2019.  Chronic kidney disease stage III -Creatinine gradually increased to 2.18. -Taking spironolactone as well as losartan.  Stable. -If creatinine  increases too much, could consider hydralazine isosorbide instead of losartan.  Also may need to stop spironolactone for fears of hyperkalemia.  Persistent atrial fibrillation/flutter -Currently on warfarin-pharmacy check today showed level in the 5 range INR.  He knows to hold and to follow new instructions.  Ventricular tachycardia -Previous had ATP on 02/13/2019.  Prior Dr. Curt Bears note reviewed.  Remains on amiodarone.  No changes made.  Last potassium 4.8.  Aortic root dilatation -40 mm mildly dilated.   Medication Adjustments/Labs and Tests Ordered: Current medicines are reviewed at length with the patient today.  Concerns regarding medicines are outlined above.  No orders of the defined types were placed in this encounter.  No orders of the defined types were placed in this encounter.   Patient Instructions  Medication Instructions:  The current medical regimen is effective;  continue present plan and medications.  *If you need a refill on your cardiac medications before your next appointment, please call your pharmacy*  Follow-Up: At [facility], you and your health needs are our priority.  As part of our continuing mission to provide you with exceptional heart care, we have created designated Provider Care Teams.  These Care Teams include your primary Cardiologist (physician) and Advanced Practice Providers (APPs -  Physician Assistants and Nurse Practitioners) who all work together to provide you with the care you need, when you need it.  We recommend signing up for the patient portal called "MyChart".  Sign up information is provided on this After Visit Summary.  MyChart is used to connect with patients for Virtual Visits (Telemedicine).  Patients are able to view lab/test results, encounter notes, upcoming appointments, etc.  Non-urgent messages can be sent to your provider as well.   To learn more about what you can do with MyChart, go to [email or web address].    Your  next appointment:   6 month(s)  The format for your next appointment:   In Person  Provider:   [name], MD   Thank you for choosing [facility]!!         Signed, Carlos Furbish, MD  02/16/2020 4:39 PM    [place]

## 2020-02-23 ENCOUNTER — Ambulatory Visit (INDEPENDENT_AMBULATORY_CARE_PROVIDER_SITE_OTHER): Payer: Medicare Other

## 2020-02-23 ENCOUNTER — Other Ambulatory Visit: Payer: Self-pay

## 2020-02-23 DIAGNOSIS — Z7901 Long term (current) use of anticoagulants: Secondary | ICD-10-CM

## 2020-02-23 DIAGNOSIS — I483 Typical atrial flutter: Secondary | ICD-10-CM

## 2020-02-23 LAB — POCT INR: INR: 1.9 — AB (ref 2.0–3.0)

## 2020-02-23 NOTE — Patient Instructions (Signed)
Description   Take 1.5 tablets, then resume same dosage 1/2 a tablet daily except for 1 tablet on Mondays, Wednesdays and Fridays. Recheck INR in 2 weeks. Coumadin Clinic [phone]

## 2020-03-06 ENCOUNTER — Other Ambulatory Visit: Payer: Self-pay | Admitting: Cardiology

## 2020-03-08 ENCOUNTER — Other Ambulatory Visit: Payer: Self-pay

## 2020-03-08 ENCOUNTER — Ambulatory Visit (INDEPENDENT_AMBULATORY_CARE_PROVIDER_SITE_OTHER): Payer: Medicare Other | Admitting: *Deleted

## 2020-03-08 DIAGNOSIS — Z7901 Long term (current) use of anticoagulants: Secondary | ICD-10-CM

## 2020-03-08 DIAGNOSIS — Z5181 Encounter for therapeutic drug level monitoring: Secondary | ICD-10-CM

## 2020-03-08 DIAGNOSIS — I483 Typical atrial flutter: Secondary | ICD-10-CM

## 2020-03-08 LAB — POCT INR: INR: 1.6 — AB (ref 2.0–3.0)

## 2020-03-08 NOTE — Patient Instructions (Signed)
Description    Take 1.5 tablets today and then start taking 1 tablet daily except for 1/2 a tablet on Sunday, Tuesday and Thursday. Recheck INR in 2 weeks. Coumadin Clinic [phone]

## 2020-03-28 ENCOUNTER — Ambulatory Visit (INDEPENDENT_AMBULATORY_CARE_PROVIDER_SITE_OTHER): Payer: Medicare Other | Admitting: *Deleted

## 2020-03-28 ENCOUNTER — Other Ambulatory Visit: Payer: Self-pay

## 2020-03-28 DIAGNOSIS — I483 Typical atrial flutter: Secondary | ICD-10-CM

## 2020-03-28 DIAGNOSIS — Z7901 Long term (current) use of anticoagulants: Secondary | ICD-10-CM

## 2020-03-28 DIAGNOSIS — Z5181 Encounter for therapeutic drug level monitoring: Secondary | ICD-10-CM | POA: Diagnosis not present

## 2020-03-28 LAB — POCT INR: INR: 3.3 — AB (ref 2.0–3.0)

## 2020-03-28 NOTE — Patient Instructions (Signed)
Description   Hold today and then continue taking 1 tablet daily except for 1/2 a tablet on Sunday, Tuesday and Thursday. Recheck INR in 2 weeks. Coumadin Clinic [phone]

## 2020-04-04 DIAGNOSIS — E1169 Type 2 diabetes mellitus with other specified complication: Secondary | ICD-10-CM | POA: Diagnosis not present

## 2020-04-04 DIAGNOSIS — I5020 Unspecified systolic (congestive) heart failure: Secondary | ICD-10-CM | POA: Diagnosis not present

## 2020-04-04 DIAGNOSIS — I10 Essential (primary) hypertension: Secondary | ICD-10-CM | POA: Diagnosis not present

## 2020-04-04 DIAGNOSIS — N400 Enlarged prostate without lower urinary tract symptoms: Secondary | ICD-10-CM | POA: Diagnosis not present

## 2020-04-04 DIAGNOSIS — E7800 Pure hypercholesterolemia, unspecified: Secondary | ICD-10-CM | POA: Diagnosis not present

## 2020-04-10 ENCOUNTER — Other Ambulatory Visit: Payer: Self-pay | Admitting: Cardiology

## 2020-04-11 ENCOUNTER — Ambulatory Visit (INDEPENDENT_AMBULATORY_CARE_PROVIDER_SITE_OTHER): Payer: Medicare Other | Admitting: *Deleted

## 2020-04-11 ENCOUNTER — Other Ambulatory Visit: Payer: Self-pay

## 2020-04-11 DIAGNOSIS — I483 Typical atrial flutter: Secondary | ICD-10-CM | POA: Diagnosis not present

## 2020-04-11 DIAGNOSIS — Z7901 Long term (current) use of anticoagulants: Secondary | ICD-10-CM | POA: Diagnosis not present

## 2020-04-11 DIAGNOSIS — Z5181 Encounter for therapeutic drug level monitoring: Secondary | ICD-10-CM | POA: Diagnosis not present

## 2020-04-11 LAB — POCT INR: INR: 2.9 (ref 2.0–3.0)

## 2020-04-11 NOTE — Patient Instructions (Signed)
Description   Continue taking 1 tablet daily except for 1/2 a tablet on Sunday, Tuesday and Thursday. Recheck INR in 3 weeks. Coumadin Clinic [phone]

## 2020-04-22 ENCOUNTER — Other Ambulatory Visit: Payer: Self-pay | Admitting: Cardiology

## 2020-04-22 MED ORDER — BISOPROLOL FUMARATE 5 MG PO TABS: 5.0000 mg | ORAL_TABLET | Freq: Every day | ORAL | 3 refills | Status: DC

## 2020-04-24 DIAGNOSIS — E1169 Type 2 diabetes mellitus with other specified complication: Secondary | ICD-10-CM | POA: Diagnosis not present

## 2020-04-24 DIAGNOSIS — N400 Enlarged prostate without lower urinary tract symptoms: Secondary | ICD-10-CM | POA: Diagnosis not present

## 2020-04-24 DIAGNOSIS — I5020 Unspecified systolic (congestive) heart failure: Secondary | ICD-10-CM | POA: Diagnosis not present

## 2020-04-24 DIAGNOSIS — I10 Essential (primary) hypertension: Secondary | ICD-10-CM | POA: Diagnosis not present

## 2020-04-24 DIAGNOSIS — E7800 Pure hypercholesterolemia, unspecified: Secondary | ICD-10-CM | POA: Diagnosis not present

## 2020-04-29 ENCOUNTER — Telehealth (INDEPENDENT_AMBULATORY_CARE_PROVIDER_SITE_OTHER): Payer: Medicare Other | Admitting: Cardiology

## 2020-04-29 ENCOUNTER — Encounter: Payer: Self-pay | Admitting: Cardiology

## 2020-04-29 ENCOUNTER — Other Ambulatory Visit: Payer: Self-pay

## 2020-04-29 VITALS — Ht 71.0 in | Wt 180.0 lb

## 2020-04-29 DIAGNOSIS — I10 Essential (primary) hypertension: Secondary | ICD-10-CM | POA: Diagnosis not present

## 2020-04-29 DIAGNOSIS — Z5181 Encounter for therapeutic drug level monitoring: Secondary | ICD-10-CM | POA: Diagnosis not present

## 2020-04-29 DIAGNOSIS — N1832 Chronic kidney disease, stage 3b: Secondary | ICD-10-CM | POA: Diagnosis not present

## 2020-04-29 DIAGNOSIS — I428 Other cardiomyopathies: Secondary | ICD-10-CM

## 2020-04-29 DIAGNOSIS — R040 Epistaxis: Secondary | ICD-10-CM

## 2020-04-29 DIAGNOSIS — I480 Paroxysmal atrial fibrillation: Secondary | ICD-10-CM | POA: Diagnosis not present

## 2020-04-29 DIAGNOSIS — Z7901 Long term (current) use of anticoagulants: Secondary | ICD-10-CM

## 2020-04-29 NOTE — Patient Instructions (Signed)
Medication Instructions:  The current medical regimen is effective;  continue present plan and medications.  *If you need a refill on your cardiac medications before your next appointment, please call your pharmacy*  Lab Work: Please have blood work when here for your next INR check (TSH, Free T4, CBC and CMP)  If you have labs (blood work) drawn today and your tests are completely normal, you will receive your results only by: [name] MyChart Message (if you have MyChart) OR . A paper copy in the mail If you have any lab test that is abnormal or we need to change your treatment, we will call you to review the results.  Follow-Up: At [facility], you and your health needs are our priority.  As part of our continuing mission to provide you with exceptional heart care, we have created designated Provider Care Teams.  These Care Teams include your primary Cardiologist (physician) and Advanced Practice Providers (APPs -  Physician Assistants and Nurse Practitioners) who all work together to provide you with the care you need, when you need it.  We recommend signing up for the patient portal called "MyChart".  Sign up information is provided on this After Visit Summary.  MyChart is used to connect with patients for Virtual Visits (Telemedicine).  Patients are able to view lab/test results, encounter notes, upcoming appointments, etc.  Non-urgent messages can be sent to your provider as well.   To learn more about what you can do with MyChart, go to [email or web address].    Your next appointment:   6 month(s)  The format for your next appointment:   In Person  Provider:   [name], MD   Thank you for choosing [facility]!!

## 2020-04-29 NOTE — Progress Notes (Signed)
Virtual Visit via Telephone Note   This visit type was conducted due to national recommendations for restrictions regarding the COVID-19 Pandemic (e.g. social distancing) in an effort to limit this patient's exposure and mitigate transmission in our community.  Due to his co-morbid illnesses, this patient is at least at moderate risk for complications without adequate follow up.  This format is felt to be most appropriate for this patient at this time.  The patient did not have access to video technology/had technical difficulties with video requiring transitioning to audio format only (telephone).  All issues noted in this document were discussed and addressed.  No physical exam could be performed with this format.  Please refer to the patient's chart for his  consent to telehealth for [facility].    Date:  04/29/2020   ID:  Carlos Gibson, DOB [birth year], MRN [id number] The patient was identified using 2 identifiers.  Patient Location: Home Provider Location: Home Office  PCP:  [name], MD  Cardiologist:  [name], MD  Electrophysiologist:  [name], MD   Evaluation Performed:  Follow-Up Visit  Chief Complaint:  Nose bleed  History of Present Illness:    Carlos Gibson is a 80 y.o. male here for the follow-up of Medtronic BiV ICD implanted 01/2018.  Has chronic systolic heart failure.  On amiodarone.  Aortic root dilatation.  Chronic kidney disease stage IIIa.  Last ejection fraction 25 to 30% with aortic root size 40 mm.  04/29/2017  Overall he feels quite well, NYHA class I without any shortness of breath orthopnea or chest pain.  He is nonischemic.  He has had a recent nosebleed however.  He states that he had stopped his Coumadin or held it for "few days ".  I asked him to go ahead and resume this.  See below.  No further nosebleed.  Denies any fevers chills nausea vomiting syncope bleeding.  The patient does not have symptoms concerning for COVID-19  infection (fever, chills, cough, or new shortness of breath).    Past Medical History:  Diagnosis Date  . Aortic regurgitation    a. mild AI by echo 01/2016.  [name] Aortic root dilation (HCC)   . Aortic valve regurgitation   . Bifascicular block   . Chronic systolic CHF (congestive heart failure) ([place])   . CKD (chronic kidney disease), stage III ([place]) 01/01/2016  . Diabetes ([place])   . Ejection fraction < 50%   . Frequent PVCs   . Gilbert's syndrome   . Hyperlipidemia   . Mild diastolic dysfunction   . Mitral regurgitation    a. mod by echo 10/207.  [name] NICM (nonischemic cardiomyopathy) ([place])   . Pericardial effusion    a. small by echo 01/2016.  . Pulmonary hypertension ([place])   . Right BBB/left post fasc block   . Thrombocytopenia ([place])    Past Surgical History:  Procedure Laterality Date  . BIV ICD INSERTION CRT-D N/A 01/26/2018   Procedure: BIV ICD INSERTION CRT-D;  Surgeon: Constance Haw, MD;  Location: [place] CV LAB;  Service: Cardiovascular;  Laterality: N/A;  . CARDIAC CATHETERIZATION N/A 12/20/2014   Procedure: Right/Left Heart Cath and Coronary Angiography;  Surgeon: Jerline Pain, MD;  Location: [place] CV LAB;  Service: Cardiovascular;  Laterality: N/A;  . CARDIOVERSION N/A 10/24/2018   Procedure: CARDIOVERSION;  Surgeon: Thayer Headings, MD;  Location: [facility] ENDOSCOPY;  Service: Cardiovascular;  Laterality: N/A;  . HERNIA REPAIR  06/2017  Current Meds  Medication Sig  . acetaminophen (TYLENOL) 650 MG CR tablet Take 650 mg by mouth every 8 (eight) hours as needed for pain.  [name] alfuzosin (UROXATRAL) 10 MG 24 hr tablet Take 10 mg by mouth at bedtime.   [name] amiodarone (PACERONE) 200 MG tablet TAKE ONE TABLET EVERY MORNING  . atorvastatin (LIPITOR) 10 MG tablet Take 10 mg by mouth at bedtime.  . bisoprolol (ZEBETA) 5 MG tablet Take 1 tablet (5 mg total) by mouth daily.  . Cholecalciferol (VITAMIN D3) 25 MCG (1000 UT) CAPS Take 1,000 Units by mouth every other  day.   . finasteride (PROSCAR) 5 MG tablet Take 5 mg by mouth daily.   . furosemide (LASIX) 40 MG tablet Take 40 mg by mouth as needed for fluid or edema.  [name] glimepiride (AMARYL) 2 MG tablet Take 2 mg by mouth at bedtime.  [name] losartan (COZAAR) 25 MG tablet TAKE ONE TABLET BY MOUTH EVERY MORNING  . potassium chloride (KLOR-CON) 10 MEQ tablet Take 1 tablet (10 mEq total) by mouth daily.  [name] spironolactone (ALDACTONE) 25 MG tablet TAKE 1/2 TABLET(12.5 MG) BY MOUTH DAILY  . traMADol (ULTRAM) 50 MG tablet Take by mouth every 6 (six) hours as needed.  . warfarin (COUMADIN) 5 MG tablet TAKE 1/2 TO 1 TAB BY MOUTH daily AS DIRECTED     Allergies:   Ace inhibitors, Coreg [carvedilol], and Toprol xl [metoprolol tartrate]   Social History   Tobacco Use  . Smoking status: Never Smoker  . Smokeless tobacco: Never Used  Vaping Use  . Vaping Use: Never used  Substance Use Topics  . Alcohol use: No    Alcohol/week: 0.0 standard drinks  . Drug use: No     Family Hx: The patient's family history includes Hypertension in his mother; Irregular heart beat in his father; Stroke in his mother; Unexplained death in his father.  ROS:   Please see the history of present illness.     All other systems reviewed and are negative.   Prior CV studies:   The following studies were reviewed today:  Cardiac cath 12/20/14  There is severe left ventricular systolic dysfunction.  Ejection fraction between 25-30% with global hypokinesis  Moderately elevated pulmonary pressures-mean PA pressure 35 mmHg consistent with secondary pulmonary hypertension as a result of increased left ventricular end-diastolic pressure  No angiographically significant coronary artery disease present.   Nonischemic cardiomyopathy. Left ventricular ejection fraction 25-30%. Left ventricular end-diastolic pressure 24 mmHg. Cardiac output 3.7 L/m with cardiac index of 1.8.  ECHO 2019:  - Left ventricle: The cavity size was severely  dilated. There was  mild concentric hypertrophy. Systolic function was severely  reduced. The estimated ejection fraction was in the range of 25%  to 30%. Severe diffuse hypokinesis with regional variations.  - Aortic valve: There was moderate regurgitation directed  eccentrically in the LVOT and towards the mitral anterior  leaflet. Regurgitation pressure half-time: 300 ms.  - Aorta: Aortic root dimension: 40 mm (ED).  - Aortic root: The aortic root was mildly dilated.  - Mitral valve: There was moderate regurgitation directed  eccentrically and posteriorly.  - Left atrium: The atrium was severely dilated.  - Right ventricle: The cavity size was moderately dilated. Wall  thickness was normal. Systolic function was moderately to  severely reduced.  - Tricuspid valve: There was mild regurgitation.  - Pulmonic valve: There was moderate regurgitation.  - Pulmonary arteries: PA peak pressure: 38 mm Hg (S).  - Pericardium,  extracardiac: A trivial, free-flowing pericardial  effusion was identified posterior to the heart and along the  right atrial free wall.    Labs/Other Tests and Data Reviewed:    EKG: 04/27/2019-biventricular pacing  Recent Labs: 08/18/2019: ALT 15; BUN 24; Creatinine, Ser 2.15; Hemoglobin 13.5; Platelets 130; Potassium 4.2; Sodium 141; TSH 7.730   Recent Lipid Panel No results found for: CHOL, TRIG, HDL, CHOLHDL, LDLCALC, LDLDIRECT  Wt Readings from Last 3 Encounters:  04/29/20 180 lb (81.6 kg)  02/16/20 186 lb (84.4 kg)  12/19/19 187 lb 6.4 oz (85 kg)     Risk Assessment/Calculations:      Objective:    Vital Signs:  Ht '5\' 11"'$  (1.803 m)   Wt 180 lb (81.6 kg)   BMI 25.10 kg/m    VITAL SIGNS:  reviewed  General, alert and oriented x3 in no acute distress, able complete full sentences without difficulty [place]  ASSESSMENT & PLAN:    Chronic systolic heart failure -EF 25 to 30%.  Medtronic CRT-D.  Functioning properly.  Continue  with goal-directed medical therapy.  Chronic kidney disease stage IIIb -Creatinine has gradually increased to 2.18. -In the future may need to stop spironolactone for fears of hyperkalemia.  Persistent atrial fibrillation/flutter -Currently on warfarin.  Directed by our pharmacy.    Chronic anticoagulation -Recently states that he had a nosebleed.  He stopped his Coumadin for a few days.  I asked him to go ahead and resume this.  Mention to him that he may wish to try Ocean spray or saline nose spray to help moisten his mucous membranes especially in the winter climate.  Ventricular tachycardia -Previously had antitachycardia pacing or ATP on 02/13/2019.  Dr. Curt Bears reviewed.  He remains on amiodarone because of this.  Continuing with high risk medication management.  Checking thyroid liver renal function.  Aortic root dilatation 40 mm and minimally dilated.  We will continue to monitor.  We will check a TSH, free T4, CBC, complete metabolic profile when he comes in for his next INR check.       COVID-19 Education: The signs and symptoms of COVID-19 were discussed with the patient and how to seek care for testing (follow up with PCP or arrange E-visit).  The importance of social distancing was discussed today.  Time:   Today, I have spent 21 minutes with the patient with telehealth technology and review of medical records discussing the above problems.     Medication Adjustments/Labs and Tests Ordered: Current medicines are reviewed at length with the patient today.  Concerns regarding medicines are outlined above.   Tests Ordered: Orders Placed This Encounter  Procedures  . CBC  . Comprehensive metabolic panel  . T4, free  . TSH    Medication Changes: No orders of the defined types were placed in this encounter.   Follow Up:  In Person in 6 month(s)  Signed, [name], MD  04/29/2020 12:48 PM    [place]

## 2020-05-02 ENCOUNTER — Ambulatory Visit (INDEPENDENT_AMBULATORY_CARE_PROVIDER_SITE_OTHER): Payer: Medicare Other | Admitting: *Deleted

## 2020-05-02 ENCOUNTER — Other Ambulatory Visit: Payer: Medicare Other

## 2020-05-02 ENCOUNTER — Other Ambulatory Visit: Payer: Self-pay

## 2020-05-02 DIAGNOSIS — I483 Typical atrial flutter: Secondary | ICD-10-CM | POA: Diagnosis not present

## 2020-05-02 DIAGNOSIS — I10 Essential (primary) hypertension: Secondary | ICD-10-CM | POA: Diagnosis not present

## 2020-05-02 DIAGNOSIS — R040 Epistaxis: Secondary | ICD-10-CM

## 2020-05-02 DIAGNOSIS — Z7901 Long term (current) use of anticoagulants: Secondary | ICD-10-CM

## 2020-05-02 DIAGNOSIS — Z5181 Encounter for therapeutic drug level monitoring: Secondary | ICD-10-CM | POA: Diagnosis not present

## 2020-05-02 DIAGNOSIS — I480 Paroxysmal atrial fibrillation: Secondary | ICD-10-CM

## 2020-05-02 DIAGNOSIS — N1832 Chronic kidney disease, stage 3b: Secondary | ICD-10-CM

## 2020-05-02 LAB — POCT INR: INR: 1.5 — AB (ref 2.0–3.0)

## 2020-05-02 NOTE — Patient Instructions (Signed)
Description    Take 1 tablet today and 1.5 tablet tomorrow, then continue taking 1 tablet daily except for 1/2 a tablet on Sunday, Tuesday and Thursday. Recheck INR in 2 weeks. Coumadin Clinic [phone]

## 2020-05-03 ENCOUNTER — Telehealth: Payer: Self-pay | Admitting: *Deleted

## 2020-05-03 LAB — COMPREHENSIVE METABOLIC PANEL
ALT: 17 IU/L (ref 0–44)
AST: 19 IU/L (ref 0–40)
Albumin/Globulin Ratio: 1.5 (ref 1.2–2.2)
Albumin: 4.1 g/dL (ref 3.7–4.7)
Alkaline Phosphatase: 66 IU/L (ref 44–121)
BUN/Creatinine Ratio: 11 (ref 10–24)
BUN: 25 mg/dL (ref 8–27)
Bilirubin Total: 1.2 mg/dL (ref 0.0–1.2)
CO2: 26 mmol/L (ref 20–29)
Calcium: 9.4 mg/dL (ref 8.6–10.2)
Chloride: 102 mmol/L (ref 96–106)
Creatinine, Ser: 2.34 mg/dL — ABNORMAL HIGH (ref 0.76–1.27)
GFR calc Af Amer: 29 mL/min/{1.73_m2} — ABNORMAL LOW (ref >59)
GFR calc non Af Amer: 25 mL/min/{1.73_m2} — ABNORMAL LOW (ref >59)
Globulin, Total: 2.7 g/dL (ref 1.5–4.5)
Glucose: 159 mg/dL — ABNORMAL HIGH (ref 65–99)
Potassium: 4.3 mmol/L (ref 3.5–5.2)
Sodium: 141 mmol/L (ref 134–144)
Total Protein: 6.8 g/dL (ref 6.0–8.5)

## 2020-05-03 LAB — CBC
Hematocrit: 43.3 % (ref 37.5–51.0)
Hemoglobin: 13.9 g/dL (ref 13.0–17.7)
MCH: 27.9 pg (ref 26.6–33.0)
MCHC: 32.1 g/dL (ref 31.5–35.7)
MCV: 87 fL (ref 79–97)
Platelets: 125 10*3/uL — ABNORMAL LOW (ref 150–450)
RBC: 4.98 x10E6/uL (ref 4.14–5.80)
RDW: 12.6 % (ref 11.6–15.4)
WBC: 4.4 10*3/uL (ref 3.4–10.8)

## 2020-05-03 LAB — TSH: TSH: 9.82 u[IU]/mL — ABNORMAL HIGH (ref 0.450–4.500)

## 2020-05-03 LAB — T4, FREE: Free T4: 1.46 ng/dL (ref 0.82–1.77)

## 2020-05-03 NOTE — Telephone Encounter (Signed)
Jerline Pain, MD  05/03/2020 4:43 PM EST Back to Top     Creat increased  Let's stop the spironolactone Free T4 OK No anemia Carlos Furbish, MD   Pt is aware of the results and above information.  He reports understanding of which medication to discontinue.

## 2020-05-16 ENCOUNTER — Ambulatory Visit (INDEPENDENT_AMBULATORY_CARE_PROVIDER_SITE_OTHER): Payer: Medicare Other

## 2020-05-16 ENCOUNTER — Other Ambulatory Visit: Payer: Self-pay

## 2020-05-16 DIAGNOSIS — I483 Typical atrial flutter: Secondary | ICD-10-CM

## 2020-05-16 DIAGNOSIS — Z7901 Long term (current) use of anticoagulants: Secondary | ICD-10-CM | POA: Diagnosis not present

## 2020-05-16 LAB — POCT INR: INR: 3.6 — AB (ref 2.0–3.0)

## 2020-05-16 NOTE — Patient Instructions (Signed)
Description   Skip today's dosage of Warfarin, then resume same dosage 1 tablet daily except for 1/2 a tablet on Sundays, Tuesdays and Thursdays. Recheck INR in 2 weeks. Coumadin Clinic [phone]

## 2020-05-30 ENCOUNTER — Other Ambulatory Visit: Payer: Self-pay

## 2020-05-30 ENCOUNTER — Ambulatory Visit (INDEPENDENT_AMBULATORY_CARE_PROVIDER_SITE_OTHER): Payer: Medicare Other | Admitting: *Deleted

## 2020-05-30 DIAGNOSIS — I483 Typical atrial flutter: Secondary | ICD-10-CM | POA: Diagnosis not present

## 2020-05-30 DIAGNOSIS — I5020 Unspecified systolic (congestive) heart failure: Secondary | ICD-10-CM | POA: Diagnosis not present

## 2020-05-30 DIAGNOSIS — E7800 Pure hypercholesterolemia, unspecified: Secondary | ICD-10-CM | POA: Diagnosis not present

## 2020-05-30 DIAGNOSIS — Z5181 Encounter for therapeutic drug level monitoring: Secondary | ICD-10-CM

## 2020-05-30 DIAGNOSIS — Z7901 Long term (current) use of anticoagulants: Secondary | ICD-10-CM

## 2020-05-30 DIAGNOSIS — I10 Essential (primary) hypertension: Secondary | ICD-10-CM | POA: Diagnosis not present

## 2020-05-30 DIAGNOSIS — E1169 Type 2 diabetes mellitus with other specified complication: Secondary | ICD-10-CM | POA: Diagnosis not present

## 2020-05-30 DIAGNOSIS — N400 Enlarged prostate without lower urinary tract symptoms: Secondary | ICD-10-CM | POA: Diagnosis not present

## 2020-05-30 LAB — POCT INR: INR: 3.1 — AB (ref 2.0–3.0)

## 2020-05-30 NOTE — Patient Instructions (Signed)
Description   Start taking warfarin 1/2 a tablet daily except for 1 tablet on Monday, Wednesday and Fridays. Recheck INR in 3 weeks. Call coumadin clinic for any changes in medications or upcoming procedures. [phone].

## 2020-06-13 ENCOUNTER — Telehealth: Payer: Self-pay | Admitting: Cardiology

## 2020-06-13 MED ORDER — POTASSIUM CHLORIDE ER 10 MEQ PO TBCR: 10.0000 meq | EXTENDED_RELEASE_TABLET | Freq: Every day | ORAL | 3 refills | Status: DC

## 2020-06-13 NOTE — Telephone Encounter (Signed)
*  STAT* If patient is at the pharmacy, call can be transferred to refill team.   1. Which medications need to be refilled? (please list name of each medication and dose if known) Potassium  2. Which pharmacy/location (including street and city if local pharmacy) is medication to be sent to? [facility] [phone]  3. Do they need a 30 day or 90 day supply? 30 days

## 2020-06-13 NOTE — Telephone Encounter (Signed)
Pt's medication was sent to pt's pharmacy as requested. Confirmation received.  °

## 2020-06-17 DIAGNOSIS — I5020 Unspecified systolic (congestive) heart failure: Secondary | ICD-10-CM | POA: Diagnosis not present

## 2020-06-17 DIAGNOSIS — I129 Hypertensive chronic kidney disease with stage 1 through stage 4 chronic kidney disease, or unspecified chronic kidney disease: Secondary | ICD-10-CM | POA: Diagnosis not present

## 2020-06-17 DIAGNOSIS — N400 Enlarged prostate without lower urinary tract symptoms: Secondary | ICD-10-CM | POA: Diagnosis not present

## 2020-06-17 DIAGNOSIS — E7800 Pure hypercholesterolemia, unspecified: Secondary | ICD-10-CM | POA: Diagnosis not present

## 2020-06-17 DIAGNOSIS — E1169 Type 2 diabetes mellitus with other specified complication: Secondary | ICD-10-CM | POA: Diagnosis not present

## 2020-06-17 DIAGNOSIS — I10 Essential (primary) hypertension: Secondary | ICD-10-CM | POA: Diagnosis not present

## 2020-06-17 DIAGNOSIS — E1121 Type 2 diabetes mellitus with diabetic nephropathy: Secondary | ICD-10-CM | POA: Diagnosis not present

## 2020-06-17 DIAGNOSIS — N1832 Chronic kidney disease, stage 3b: Secondary | ICD-10-CM | POA: Diagnosis not present

## 2020-06-18 DIAGNOSIS — D696 Thrombocytopenia, unspecified: Secondary | ICD-10-CM | POA: Diagnosis not present

## 2020-06-18 DIAGNOSIS — E1121 Type 2 diabetes mellitus with diabetic nephropathy: Secondary | ICD-10-CM | POA: Diagnosis not present

## 2020-06-18 DIAGNOSIS — I351 Nonrheumatic aortic (valve) insufficiency: Secondary | ICD-10-CM | POA: Diagnosis not present

## 2020-06-18 DIAGNOSIS — E1169 Type 2 diabetes mellitus with other specified complication: Secondary | ICD-10-CM | POA: Diagnosis not present

## 2020-06-18 DIAGNOSIS — N1832 Chronic kidney disease, stage 3b: Secondary | ICD-10-CM | POA: Diagnosis not present

## 2020-06-18 DIAGNOSIS — I483 Typical atrial flutter: Secondary | ICD-10-CM | POA: Diagnosis not present

## 2020-06-18 DIAGNOSIS — D6869 Other thrombophilia: Secondary | ICD-10-CM | POA: Diagnosis not present

## 2020-06-18 DIAGNOSIS — I129 Hypertensive chronic kidney disease with stage 1 through stage 4 chronic kidney disease, or unspecified chronic kidney disease: Secondary | ICD-10-CM | POA: Diagnosis not present

## 2020-06-18 DIAGNOSIS — I5020 Unspecified systolic (congestive) heart failure: Secondary | ICD-10-CM | POA: Diagnosis not present

## 2020-06-20 ENCOUNTER — Other Ambulatory Visit: Payer: Self-pay

## 2020-06-20 ENCOUNTER — Ambulatory Visit (INDEPENDENT_AMBULATORY_CARE_PROVIDER_SITE_OTHER): Payer: Medicare Other | Admitting: *Deleted

## 2020-06-20 DIAGNOSIS — Z7901 Long term (current) use of anticoagulants: Secondary | ICD-10-CM

## 2020-06-20 DIAGNOSIS — I483 Typical atrial flutter: Secondary | ICD-10-CM

## 2020-06-20 LAB — POCT INR: INR: 2.9 (ref 2.0–3.0)

## 2020-06-20 NOTE — Patient Instructions (Signed)
Description   Continue taking Warfarin 1/2 tablet daily except for 1 tablet on Monday, Wednesday and Fridays. Recheck INR in 4 weeks. Call coumadin clinic for any changes in medications or upcoming procedures. [phone].

## 2020-06-25 ENCOUNTER — Other Ambulatory Visit: Payer: Self-pay | Admitting: Cardiology

## 2020-06-30 ENCOUNTER — Other Ambulatory Visit: Payer: Self-pay | Admitting: Cardiology

## 2020-07-01 MED ORDER — BISOPROLOL FUMARATE 5 MG PO TABS: 5.0000 mg | ORAL_TABLET | Freq: Every day | ORAL | 3 refills | Status: DC

## 2020-07-02 ENCOUNTER — Ambulatory Visit (INDEPENDENT_AMBULATORY_CARE_PROVIDER_SITE_OTHER): Payer: Medicare Other

## 2020-07-02 DIAGNOSIS — I428 Other cardiomyopathies: Secondary | ICD-10-CM | POA: Diagnosis not present

## 2020-07-02 LAB — CUP PACEART REMOTE DEVICE CHECK
Battery Remaining Longevity: 34 mo
Battery Voltage: 2.95 V
Brady Statistic AP VP Percent: 97.26 %
Brady Statistic AP VS Percent: 0.06 %
Brady Statistic AS VP Percent: 2.66 %
Brady Statistic AS VS Percent: 0.02 %
Brady Statistic RA Percent Paced: 97.15 %
Brady Statistic RV Percent Paced: 99.6 %
Date Time Interrogation Session: 20220329012406
HighPow Impedance: 73 Ohm
Implantable Lead Implant Date: 20191023
Implantable Lead Implant Date: 20191023
Implantable Lead Implant Date: 20191023
Implantable Lead Location: 753858
Implantable Lead Location: 753859
Implantable Lead Location: 753860
Implantable Lead Model: 4598
Implantable Lead Model: 5076
Implantable Lead Model: 6935
Implantable Pulse Generator Implant Date: 20191023
Lead Channel Impedance Value: 178.5 Ohm
Lead Channel Impedance Value: 185.725
Lead Channel Impedance Value: 200.978
Lead Channel Impedance Value: 228 Ohm
Lead Channel Impedance Value: 239.922
Lead Channel Impedance Value: 323 Ohm
Lead Channel Impedance Value: 342 Ohm
Lead Channel Impedance Value: 399 Ohm
Lead Channel Impedance Value: 399 Ohm
Lead Channel Impedance Value: 437 Ohm
Lead Channel Impedance Value: 456 Ohm
Lead Channel Impedance Value: 532 Ohm
Lead Channel Impedance Value: 532 Ohm
Lead Channel Impedance Value: 627 Ohm
Lead Channel Impedance Value: 627 Ohm
Lead Channel Impedance Value: 779 Ohm
Lead Channel Impedance Value: 817 Ohm
Lead Channel Impedance Value: 836 Ohm
Lead Channel Pacing Threshold Amplitude: 0.5 V
Lead Channel Pacing Threshold Amplitude: 1.125 V
Lead Channel Pacing Threshold Amplitude: 2.125 V
Lead Channel Pacing Threshold Pulse Width: 0.4 ms
Lead Channel Pacing Threshold Pulse Width: 0.4 ms
Lead Channel Pacing Threshold Pulse Width: 0.8 ms
Lead Channel Sensing Intrinsic Amplitude: 12.25 mV
Lead Channel Sensing Intrinsic Amplitude: 12.25 mV
Lead Channel Sensing Intrinsic Amplitude: 3.375 mV
Lead Channel Sensing Intrinsic Amplitude: 3.375 mV
Lead Channel Setting Pacing Amplitude: 1.5 V
Lead Channel Setting Pacing Amplitude: 2.25 V
Lead Channel Setting Pacing Amplitude: 3.25 V
Lead Channel Setting Pacing Pulse Width: 0.4 ms
Lead Channel Setting Pacing Pulse Width: 0.8 ms
Lead Channel Setting Sensing Sensitivity: 0.3 mV

## 2020-07-15 DIAGNOSIS — Z79899 Other long term (current) drug therapy: Secondary | ICD-10-CM | POA: Diagnosis not present

## 2020-07-16 NOTE — Progress Notes (Signed)
Remote ICD transmission.   

## 2020-07-18 ENCOUNTER — Ambulatory Visit (INDEPENDENT_AMBULATORY_CARE_PROVIDER_SITE_OTHER): Payer: Medicare Other | Admitting: Pharmacist

## 2020-07-18 ENCOUNTER — Other Ambulatory Visit: Payer: Self-pay

## 2020-07-18 DIAGNOSIS — I483 Typical atrial flutter: Secondary | ICD-10-CM | POA: Diagnosis not present

## 2020-07-18 DIAGNOSIS — Z7901 Long term (current) use of anticoagulants: Secondary | ICD-10-CM

## 2020-07-18 LAB — POCT INR: INR: 2.6 (ref 2.0–3.0)

## 2020-07-18 NOTE — Patient Instructions (Addendum)
Continue taking Warfarin 1/2 tablet daily except for 1 tablet on Monday, Wednesday and Fridays. Recheck INR in 5 weeks. Call coumadin clinic for any changes in medications or upcoming procedures. [phone].

## 2020-07-29 DIAGNOSIS — N1832 Chronic kidney disease, stage 3b: Secondary | ICD-10-CM | POA: Diagnosis not present

## 2020-07-29 DIAGNOSIS — E1121 Type 2 diabetes mellitus with diabetic nephropathy: Secondary | ICD-10-CM | POA: Diagnosis not present

## 2020-07-29 DIAGNOSIS — I5020 Unspecified systolic (congestive) heart failure: Secondary | ICD-10-CM | POA: Diagnosis not present

## 2020-07-29 DIAGNOSIS — N400 Enlarged prostate without lower urinary tract symptoms: Secondary | ICD-10-CM | POA: Diagnosis not present

## 2020-07-29 DIAGNOSIS — I10 Essential (primary) hypertension: Secondary | ICD-10-CM | POA: Diagnosis not present

## 2020-07-29 DIAGNOSIS — E1169 Type 2 diabetes mellitus with other specified complication: Secondary | ICD-10-CM | POA: Diagnosis not present

## 2020-07-29 DIAGNOSIS — I129 Hypertensive chronic kidney disease with stage 1 through stage 4 chronic kidney disease, or unspecified chronic kidney disease: Secondary | ICD-10-CM | POA: Diagnosis not present

## 2020-07-29 DIAGNOSIS — E7800 Pure hypercholesterolemia, unspecified: Secondary | ICD-10-CM | POA: Diagnosis not present

## 2020-07-30 DIAGNOSIS — D631 Anemia in chronic kidney disease: Secondary | ICD-10-CM | POA: Diagnosis not present

## 2020-07-30 DIAGNOSIS — N2581 Secondary hyperparathyroidism of renal origin: Secondary | ICD-10-CM | POA: Diagnosis not present

## 2020-07-30 DIAGNOSIS — I5020 Unspecified systolic (congestive) heart failure: Secondary | ICD-10-CM | POA: Diagnosis not present

## 2020-07-30 DIAGNOSIS — N184 Chronic kidney disease, stage 4 (severe): Secondary | ICD-10-CM | POA: Diagnosis not present

## 2020-07-30 DIAGNOSIS — N1832 Chronic kidney disease, stage 3b: Secondary | ICD-10-CM | POA: Diagnosis not present

## 2020-07-30 DIAGNOSIS — I129 Hypertensive chronic kidney disease with stage 1 through stage 4 chronic kidney disease, or unspecified chronic kidney disease: Secondary | ICD-10-CM | POA: Diagnosis not present

## 2020-07-30 DIAGNOSIS — E1122 Type 2 diabetes mellitus with diabetic chronic kidney disease: Secondary | ICD-10-CM | POA: Diagnosis not present

## 2020-07-30 DIAGNOSIS — E7800 Pure hypercholesterolemia, unspecified: Secondary | ICD-10-CM | POA: Diagnosis not present

## 2020-07-31 ENCOUNTER — Other Ambulatory Visit: Payer: Self-pay | Admitting: Nephrology

## 2020-07-31 DIAGNOSIS — N184 Chronic kidney disease, stage 4 (severe): Secondary | ICD-10-CM

## 2020-08-07 ENCOUNTER — Ambulatory Visit
Admission: RE | Admit: 2020-08-07 | Discharge: 2020-08-07 | Disposition: A | Discharge status: Home / Self Care | Payer: Medicare Other | Source: Ambulatory Visit | Attending: Nephrology | Admitting: Nephrology

## 2020-08-07 DIAGNOSIS — N281 Cyst of kidney, acquired: Secondary | ICD-10-CM | POA: Diagnosis not present

## 2020-08-07 DIAGNOSIS — N184 Chronic kidney disease, stage 4 (severe): Secondary | ICD-10-CM

## 2020-08-07 DIAGNOSIS — R9341 Abnormal radiologic findings on diagnostic imaging of renal pelvis, ureter, or bladder: Secondary | ICD-10-CM | POA: Diagnosis not present

## 2020-08-07 DIAGNOSIS — N189 Chronic kidney disease, unspecified: Secondary | ICD-10-CM | POA: Diagnosis not present

## 2020-08-07 DIAGNOSIS — N3289 Other specified disorders of bladder: Secondary | ICD-10-CM | POA: Diagnosis not present

## 2020-08-19 DIAGNOSIS — E7800 Pure hypercholesterolemia, unspecified: Secondary | ICD-10-CM | POA: Diagnosis not present

## 2020-08-19 DIAGNOSIS — I129 Hypertensive chronic kidney disease with stage 1 through stage 4 chronic kidney disease, or unspecified chronic kidney disease: Secondary | ICD-10-CM | POA: Diagnosis not present

## 2020-08-19 DIAGNOSIS — I5020 Unspecified systolic (congestive) heart failure: Secondary | ICD-10-CM | POA: Diagnosis not present

## 2020-08-19 DIAGNOSIS — E1121 Type 2 diabetes mellitus with diabetic nephropathy: Secondary | ICD-10-CM | POA: Diagnosis not present

## 2020-08-19 DIAGNOSIS — I10 Essential (primary) hypertension: Secondary | ICD-10-CM | POA: Diagnosis not present

## 2020-08-19 DIAGNOSIS — E1169 Type 2 diabetes mellitus with other specified complication: Secondary | ICD-10-CM | POA: Diagnosis not present

## 2020-08-19 DIAGNOSIS — N400 Enlarged prostate without lower urinary tract symptoms: Secondary | ICD-10-CM | POA: Diagnosis not present

## 2020-08-19 DIAGNOSIS — N1832 Chronic kidney disease, stage 3b: Secondary | ICD-10-CM | POA: Diagnosis not present

## 2020-08-22 ENCOUNTER — Other Ambulatory Visit: Payer: Self-pay

## 2020-08-22 ENCOUNTER — Ambulatory Visit (INDEPENDENT_AMBULATORY_CARE_PROVIDER_SITE_OTHER): Payer: Medicare Other | Admitting: *Deleted

## 2020-08-22 DIAGNOSIS — I483 Typical atrial flutter: Secondary | ICD-10-CM

## 2020-08-22 DIAGNOSIS — Z7901 Long term (current) use of anticoagulants: Secondary | ICD-10-CM

## 2020-08-22 LAB — POCT INR: INR: 2.4 (ref 2.0–3.0)

## 2020-08-22 NOTE — Patient Instructions (Addendum)
Description   Today take 1 tablet (since you missed Tuesday's dose) then continue taking Warfarin 1/2 tablet daily except for 1 tablet on Monday, Wednesday and Fridays. Recheck INR in 6 weeks. Call coumadin clinic for any changes in medications or upcoming procedures. [phone].

## 2020-09-13 DIAGNOSIS — I10 Essential (primary) hypertension: Secondary | ICD-10-CM | POA: Diagnosis not present

## 2020-09-13 DIAGNOSIS — E1121 Type 2 diabetes mellitus with diabetic nephropathy: Secondary | ICD-10-CM | POA: Diagnosis not present

## 2020-09-13 DIAGNOSIS — E7800 Pure hypercholesterolemia, unspecified: Secondary | ICD-10-CM | POA: Diagnosis not present

## 2020-09-13 DIAGNOSIS — E1169 Type 2 diabetes mellitus with other specified complication: Secondary | ICD-10-CM | POA: Diagnosis not present

## 2020-09-13 DIAGNOSIS — N1832 Chronic kidney disease, stage 3b: Secondary | ICD-10-CM | POA: Diagnosis not present

## 2020-09-13 DIAGNOSIS — I129 Hypertensive chronic kidney disease with stage 1 through stage 4 chronic kidney disease, or unspecified chronic kidney disease: Secondary | ICD-10-CM | POA: Diagnosis not present

## 2020-09-13 DIAGNOSIS — I5020 Unspecified systolic (congestive) heart failure: Secondary | ICD-10-CM | POA: Diagnosis not present

## 2020-09-13 DIAGNOSIS — N400 Enlarged prostate without lower urinary tract symptoms: Secondary | ICD-10-CM | POA: Diagnosis not present

## 2020-09-27 DIAGNOSIS — R3912 Poor urinary stream: Secondary | ICD-10-CM | POA: Diagnosis not present

## 2020-09-27 DIAGNOSIS — N401 Enlarged prostate with lower urinary tract symptoms: Secondary | ICD-10-CM | POA: Diagnosis not present

## 2020-10-01 ENCOUNTER — Ambulatory Visit (INDEPENDENT_AMBULATORY_CARE_PROVIDER_SITE_OTHER): Payer: Medicare Other

## 2020-10-01 DIAGNOSIS — I428 Other cardiomyopathies: Secondary | ICD-10-CM

## 2020-10-01 LAB — CUP PACEART REMOTE DEVICE CHECK
Battery Remaining Longevity: 27 mo
Battery Voltage: 2.94 V
Brady Statistic AP VP Percent: 94.65 %
Brady Statistic AP VS Percent: 0.12 %
Brady Statistic AS VP Percent: 5.17 %
Brady Statistic AS VS Percent: 0.05 %
Brady Statistic RA Percent Paced: 94.48 %
Brady Statistic RV Percent Paced: 99.38 %
Date Time Interrogation Session: 20220628061605
HighPow Impedance: 60 Ohm
Implantable Lead Implant Date: 20191023
Implantable Lead Implant Date: 20191023
Implantable Lead Implant Date: 20191023
Implantable Lead Location: 753858
Implantable Lead Location: 753859
Implantable Lead Location: 753860
Implantable Lead Model: 4598
Implantable Lead Model: 5076
Implantable Lead Model: 6935
Implantable Pulse Generator Implant Date: 20191023
Lead Channel Impedance Value: 166.114
Lead Channel Impedance Value: 166.114
Lead Channel Impedance Value: 171 Ohm
Lead Channel Impedance Value: 174.595
Lead Channel Impedance Value: 180 Ohm
Lead Channel Impedance Value: 304 Ohm
Lead Channel Impedance Value: 323 Ohm
Lead Channel Impedance Value: 342 Ohm
Lead Channel Impedance Value: 342 Ohm
Lead Channel Impedance Value: 380 Ohm
Lead Channel Impedance Value: 380 Ohm
Lead Channel Impedance Value: 380 Ohm
Lead Channel Impedance Value: 494 Ohm
Lead Channel Impedance Value: 589 Ohm
Lead Channel Impedance Value: 589 Ohm
Lead Channel Impedance Value: 589 Ohm
Lead Channel Impedance Value: 627 Ohm
Lead Channel Impedance Value: 646 Ohm
Lead Channel Pacing Threshold Amplitude: 0.625 V
Lead Channel Pacing Threshold Amplitude: 1.25 V
Lead Channel Pacing Threshold Amplitude: 2.125 V
Lead Channel Pacing Threshold Pulse Width: 0.4 ms
Lead Channel Pacing Threshold Pulse Width: 0.4 ms
Lead Channel Pacing Threshold Pulse Width: 0.8 ms
Lead Channel Sensing Intrinsic Amplitude: 15 mV
Lead Channel Sensing Intrinsic Amplitude: 15 mV
Lead Channel Sensing Intrinsic Amplitude: 2.5 mV
Lead Channel Sensing Intrinsic Amplitude: 2.5 mV
Lead Channel Setting Pacing Amplitude: 1.5 V
Lead Channel Setting Pacing Amplitude: 2.5 V
Lead Channel Setting Pacing Amplitude: 3.25 V
Lead Channel Setting Pacing Pulse Width: 0.4 ms
Lead Channel Setting Pacing Pulse Width: 0.8 ms
Lead Channel Setting Sensing Sensitivity: 0.3 mV

## 2020-10-15 DIAGNOSIS — I10 Essential (primary) hypertension: Secondary | ICD-10-CM | POA: Diagnosis not present

## 2020-10-15 DIAGNOSIS — E1169 Type 2 diabetes mellitus with other specified complication: Secondary | ICD-10-CM | POA: Diagnosis not present

## 2020-10-15 DIAGNOSIS — E7800 Pure hypercholesterolemia, unspecified: Secondary | ICD-10-CM | POA: Diagnosis not present

## 2020-10-15 DIAGNOSIS — I129 Hypertensive chronic kidney disease with stage 1 through stage 4 chronic kidney disease, or unspecified chronic kidney disease: Secondary | ICD-10-CM | POA: Diagnosis not present

## 2020-10-15 DIAGNOSIS — N400 Enlarged prostate without lower urinary tract symptoms: Secondary | ICD-10-CM | POA: Diagnosis not present

## 2020-10-15 DIAGNOSIS — N1832 Chronic kidney disease, stage 3b: Secondary | ICD-10-CM | POA: Diagnosis not present

## 2020-10-15 DIAGNOSIS — I5020 Unspecified systolic (congestive) heart failure: Secondary | ICD-10-CM | POA: Diagnosis not present

## 2020-10-15 DIAGNOSIS — E1121 Type 2 diabetes mellitus with diabetic nephropathy: Secondary | ICD-10-CM | POA: Diagnosis not present

## 2020-10-16 ENCOUNTER — Ambulatory Visit (INDEPENDENT_AMBULATORY_CARE_PROVIDER_SITE_OTHER): Payer: Medicare Other | Admitting: *Deleted

## 2020-10-16 ENCOUNTER — Telehealth: Payer: Self-pay | Admitting: Cardiovascular Disease

## 2020-10-16 ENCOUNTER — Other Ambulatory Visit: Payer: Self-pay

## 2020-10-16 DIAGNOSIS — Z7901 Long term (current) use of anticoagulants: Secondary | ICD-10-CM

## 2020-10-16 DIAGNOSIS — I483 Typical atrial flutter: Secondary | ICD-10-CM | POA: Diagnosis not present

## 2020-10-16 LAB — POCT INR: INR: 2.5 (ref 2.0–3.0)

## 2020-10-16 NOTE — Patient Instructions (Signed)
Description   Continue taking Warfarin 1/2 tablet daily except for 1 tablet on Monday, Wednesday and Fridays. Recheck INR in 6 weeks. Call coumadin clinic for any changes in medications or upcoming procedures. [phone].

## 2020-10-16 NOTE — Telephone Encounter (Signed)
Error

## 2020-10-21 DIAGNOSIS — Z23 Encounter for immunization: Secondary | ICD-10-CM | POA: Diagnosis not present

## 2020-10-21 NOTE — Progress Notes (Signed)
Carelink Summary Report / Loop Recorder 

## 2020-11-13 DIAGNOSIS — I5020 Unspecified systolic (congestive) heart failure: Secondary | ICD-10-CM | POA: Diagnosis not present

## 2020-11-13 DIAGNOSIS — I10 Essential (primary) hypertension: Secondary | ICD-10-CM | POA: Diagnosis not present

## 2020-11-13 DIAGNOSIS — I129 Hypertensive chronic kidney disease with stage 1 through stage 4 chronic kidney disease, or unspecified chronic kidney disease: Secondary | ICD-10-CM | POA: Diagnosis not present

## 2020-11-13 DIAGNOSIS — E1121 Type 2 diabetes mellitus with diabetic nephropathy: Secondary | ICD-10-CM | POA: Diagnosis not present

## 2020-11-13 DIAGNOSIS — N184 Chronic kidney disease, stage 4 (severe): Secondary | ICD-10-CM | POA: Diagnosis not present

## 2020-11-13 DIAGNOSIS — E1169 Type 2 diabetes mellitus with other specified complication: Secondary | ICD-10-CM | POA: Diagnosis not present

## 2020-11-13 DIAGNOSIS — N400 Enlarged prostate without lower urinary tract symptoms: Secondary | ICD-10-CM | POA: Diagnosis not present

## 2020-11-13 DIAGNOSIS — N2581 Secondary hyperparathyroidism of renal origin: Secondary | ICD-10-CM | POA: Diagnosis not present

## 2020-11-13 DIAGNOSIS — E7800 Pure hypercholesterolemia, unspecified: Secondary | ICD-10-CM | POA: Diagnosis not present

## 2020-11-27 ENCOUNTER — Other Ambulatory Visit: Payer: Self-pay

## 2020-11-27 ENCOUNTER — Ambulatory Visit (INDEPENDENT_AMBULATORY_CARE_PROVIDER_SITE_OTHER): Payer: Medicare Other | Admitting: *Deleted

## 2020-11-27 DIAGNOSIS — Z7901 Long term (current) use of anticoagulants: Secondary | ICD-10-CM | POA: Diagnosis not present

## 2020-11-27 DIAGNOSIS — I483 Typical atrial flutter: Secondary | ICD-10-CM

## 2020-11-27 LAB — POCT INR: INR: 1.4 — AB (ref 2.0–3.0)

## 2020-11-27 NOTE — Patient Instructions (Signed)
Description   Today take 1.5 tablets and 1 tablet tomorrow then continue taking Warfarin 1/2 tablet daily except for 1 tablet on Monday, Wednesday and Fridays. Recheck INR in 1 week (normally 6 weeks). Call coumadin clinic for any changes in medications or upcoming procedures. [phone].

## 2020-11-28 DIAGNOSIS — N2581 Secondary hyperparathyroidism of renal origin: Secondary | ICD-10-CM | POA: Diagnosis not present

## 2020-11-28 DIAGNOSIS — D631 Anemia in chronic kidney disease: Secondary | ICD-10-CM | POA: Diagnosis not present

## 2020-11-28 DIAGNOSIS — E7800 Pure hypercholesterolemia, unspecified: Secondary | ICD-10-CM | POA: Diagnosis not present

## 2020-11-28 DIAGNOSIS — I129 Hypertensive chronic kidney disease with stage 1 through stage 4 chronic kidney disease, or unspecified chronic kidney disease: Secondary | ICD-10-CM | POA: Diagnosis not present

## 2020-11-28 DIAGNOSIS — E1122 Type 2 diabetes mellitus with diabetic chronic kidney disease: Secondary | ICD-10-CM | POA: Diagnosis not present

## 2020-11-28 DIAGNOSIS — I5020 Unspecified systolic (congestive) heart failure: Secondary | ICD-10-CM | POA: Diagnosis not present

## 2020-11-28 DIAGNOSIS — N184 Chronic kidney disease, stage 4 (severe): Secondary | ICD-10-CM | POA: Diagnosis not present

## 2020-12-04 ENCOUNTER — Other Ambulatory Visit: Payer: Self-pay | Admitting: Cardiology

## 2020-12-06 ENCOUNTER — Other Ambulatory Visit: Payer: Self-pay

## 2020-12-06 ENCOUNTER — Ambulatory Visit (INDEPENDENT_AMBULATORY_CARE_PROVIDER_SITE_OTHER): Payer: Medicare Other | Admitting: *Deleted

## 2020-12-06 DIAGNOSIS — I483 Typical atrial flutter: Secondary | ICD-10-CM

## 2020-12-06 DIAGNOSIS — Z7901 Long term (current) use of anticoagulants: Secondary | ICD-10-CM | POA: Diagnosis not present

## 2020-12-06 DIAGNOSIS — Z5181 Encounter for therapeutic drug level monitoring: Secondary | ICD-10-CM

## 2020-12-06 LAB — POCT INR: INR: 2.1 (ref 2.0–3.0)

## 2020-12-06 NOTE — Patient Instructions (Signed)
Description    Continue taking Warfarin 1/2 tablet daily except for 1 tablet on Monday, Wednesday and Fridays. Recheck INR in 4 weeks. Call coumadin clinic for any changes in medications or upcoming procedures. [phone].

## 2020-12-13 DIAGNOSIS — D696 Thrombocytopenia, unspecified: Secondary | ICD-10-CM | POA: Diagnosis not present

## 2020-12-13 DIAGNOSIS — N2581 Secondary hyperparathyroidism of renal origin: Secondary | ICD-10-CM | POA: Diagnosis not present

## 2020-12-13 DIAGNOSIS — I129 Hypertensive chronic kidney disease with stage 1 through stage 4 chronic kidney disease, or unspecified chronic kidney disease: Secondary | ICD-10-CM | POA: Diagnosis not present

## 2020-12-13 DIAGNOSIS — E1121 Type 2 diabetes mellitus with diabetic nephropathy: Secondary | ICD-10-CM | POA: Diagnosis not present

## 2020-12-13 DIAGNOSIS — I429 Cardiomyopathy, unspecified: Secondary | ICD-10-CM | POA: Diagnosis not present

## 2020-12-13 DIAGNOSIS — Z23 Encounter for immunization: Secondary | ICD-10-CM | POA: Diagnosis not present

## 2020-12-13 DIAGNOSIS — I5020 Unspecified systolic (congestive) heart failure: Secondary | ICD-10-CM | POA: Diagnosis not present

## 2020-12-13 DIAGNOSIS — I483 Typical atrial flutter: Secondary | ICD-10-CM | POA: Diagnosis not present

## 2020-12-13 DIAGNOSIS — Z79899 Other long term (current) drug therapy: Secondary | ICD-10-CM | POA: Diagnosis not present

## 2020-12-13 DIAGNOSIS — N184 Chronic kidney disease, stage 4 (severe): Secondary | ICD-10-CM | POA: Diagnosis not present

## 2020-12-13 DIAGNOSIS — I10 Essential (primary) hypertension: Secondary | ICD-10-CM | POA: Diagnosis not present

## 2020-12-13 DIAGNOSIS — E1169 Type 2 diabetes mellitus with other specified complication: Secondary | ICD-10-CM | POA: Diagnosis not present

## 2020-12-25 DIAGNOSIS — Z0000 Encounter for general adult medical examination without abnormal findings: Secondary | ICD-10-CM | POA: Diagnosis not present

## 2020-12-25 DIAGNOSIS — Z1389 Encounter for screening for other disorder: Secondary | ICD-10-CM | POA: Diagnosis not present

## 2020-12-31 ENCOUNTER — Ambulatory Visit (INDEPENDENT_AMBULATORY_CARE_PROVIDER_SITE_OTHER): Payer: Medicare Other

## 2020-12-31 DIAGNOSIS — N2581 Secondary hyperparathyroidism of renal origin: Secondary | ICD-10-CM | POA: Diagnosis not present

## 2020-12-31 DIAGNOSIS — I428 Other cardiomyopathies: Secondary | ICD-10-CM | POA: Diagnosis not present

## 2020-12-31 DIAGNOSIS — E1121 Type 2 diabetes mellitus with diabetic nephropathy: Secondary | ICD-10-CM | POA: Diagnosis not present

## 2020-12-31 DIAGNOSIS — N400 Enlarged prostate without lower urinary tract symptoms: Secondary | ICD-10-CM | POA: Diagnosis not present

## 2020-12-31 DIAGNOSIS — I129 Hypertensive chronic kidney disease with stage 1 through stage 4 chronic kidney disease, or unspecified chronic kidney disease: Secondary | ICD-10-CM | POA: Diagnosis not present

## 2020-12-31 DIAGNOSIS — E1169 Type 2 diabetes mellitus with other specified complication: Secondary | ICD-10-CM | POA: Diagnosis not present

## 2020-12-31 DIAGNOSIS — N184 Chronic kidney disease, stage 4 (severe): Secondary | ICD-10-CM | POA: Diagnosis not present

## 2020-12-31 DIAGNOSIS — E7800 Pure hypercholesterolemia, unspecified: Secondary | ICD-10-CM | POA: Diagnosis not present

## 2020-12-31 DIAGNOSIS — I5020 Unspecified systolic (congestive) heart failure: Secondary | ICD-10-CM | POA: Diagnosis not present

## 2020-12-31 DIAGNOSIS — I10 Essential (primary) hypertension: Secondary | ICD-10-CM | POA: Diagnosis not present

## 2020-12-31 LAB — CUP PACEART REMOTE DEVICE CHECK
Battery Remaining Longevity: 22 mo
Battery Voltage: 2.93 V
Brady Statistic AP VP Percent: 96.53 %
Brady Statistic AP VS Percent: 0.15 %
Brady Statistic AS VP Percent: 3.29 %
Brady Statistic AS VS Percent: 0.03 %
Brady Statistic RA Percent Paced: 96.41 %
Brady Statistic RV Percent Paced: 99.42 %
Date Time Interrogation Session: 20220927042206
HighPow Impedance: 63 Ohm
Implantable Lead Implant Date: 20191023
Implantable Lead Implant Date: 20191023
Implantable Lead Implant Date: 20191023
Implantable Lead Location: 753858
Implantable Lead Location: 753859
Implantable Lead Location: 753860
Implantable Lead Model: 4598
Implantable Lead Model: 5076
Implantable Lead Model: 6935
Implantable Pulse Generator Implant Date: 20191023
Lead Channel Impedance Value: 171 Ohm
Lead Channel Impedance Value: 180 Ohm
Lead Channel Impedance Value: 180 Ohm
Lead Channel Impedance Value: 180 Ohm
Lead Channel Impedance Value: 190 Ohm
Lead Channel Impedance Value: 323 Ohm
Lead Channel Impedance Value: 342 Ohm
Lead Channel Impedance Value: 342 Ohm
Lead Channel Impedance Value: 380 Ohm
Lead Channel Impedance Value: 380 Ohm
Lead Channel Impedance Value: 380 Ohm
Lead Channel Impedance Value: 399 Ohm
Lead Channel Impedance Value: 494 Ohm
Lead Channel Impedance Value: 589 Ohm
Lead Channel Impedance Value: 589 Ohm
Lead Channel Impedance Value: 627 Ohm
Lead Channel Impedance Value: 627 Ohm
Lead Channel Impedance Value: 627 Ohm
Lead Channel Pacing Threshold Amplitude: 0.625 V
Lead Channel Pacing Threshold Amplitude: 1.125 V
Lead Channel Pacing Threshold Amplitude: 1.5 V
Lead Channel Pacing Threshold Pulse Width: 0.4 ms
Lead Channel Pacing Threshold Pulse Width: 0.4 ms
Lead Channel Pacing Threshold Pulse Width: 0.8 ms
Lead Channel Sensing Intrinsic Amplitude: 12.125 mV
Lead Channel Sensing Intrinsic Amplitude: 12.125 mV
Lead Channel Sensing Intrinsic Amplitude: 2.75 mV
Lead Channel Sensing Intrinsic Amplitude: 2.75 mV
Lead Channel Setting Pacing Amplitude: 1.5 V
Lead Channel Setting Pacing Amplitude: 3 V
Lead Channel Setting Pacing Amplitude: 3.25 V
Lead Channel Setting Pacing Pulse Width: 0.4 ms
Lead Channel Setting Pacing Pulse Width: 0.8 ms
Lead Channel Setting Sensing Sensitivity: 0.3 mV

## 2021-01-03 ENCOUNTER — Other Ambulatory Visit: Payer: Self-pay

## 2021-01-03 ENCOUNTER — Ambulatory Visit (INDEPENDENT_AMBULATORY_CARE_PROVIDER_SITE_OTHER): Payer: Medicare Other | Admitting: *Deleted

## 2021-01-03 DIAGNOSIS — Z5181 Encounter for therapeutic drug level monitoring: Secondary | ICD-10-CM | POA: Diagnosis not present

## 2021-01-03 DIAGNOSIS — Z7901 Long term (current) use of anticoagulants: Secondary | ICD-10-CM

## 2021-01-03 DIAGNOSIS — I483 Typical atrial flutter: Secondary | ICD-10-CM | POA: Diagnosis not present

## 2021-01-03 LAB — POCT INR: INR: 3 (ref 2.0–3.0)

## 2021-01-03 NOTE — Patient Instructions (Signed)
Description    Continue taking Warfarin 1/2 tablet daily except for 1 tablet on Monday, Wednesday and Fridays. Recheck INR in 5 weeks. Call coumadin clinic for any changes in medications or upcoming procedures. [phone].

## 2021-01-07 NOTE — Progress Notes (Signed)
Remote ICD transmission.   

## 2021-01-20 DIAGNOSIS — E119 Type 2 diabetes mellitus without complications: Secondary | ICD-10-CM | POA: Diagnosis not present

## 2021-02-03 DIAGNOSIS — I10 Essential (primary) hypertension: Secondary | ICD-10-CM | POA: Diagnosis not present

## 2021-02-03 DIAGNOSIS — N2581 Secondary hyperparathyroidism of renal origin: Secondary | ICD-10-CM | POA: Diagnosis not present

## 2021-02-03 DIAGNOSIS — N400 Enlarged prostate without lower urinary tract symptoms: Secondary | ICD-10-CM | POA: Diagnosis not present

## 2021-02-03 DIAGNOSIS — I129 Hypertensive chronic kidney disease with stage 1 through stage 4 chronic kidney disease, or unspecified chronic kidney disease: Secondary | ICD-10-CM | POA: Diagnosis not present

## 2021-02-03 DIAGNOSIS — E7800 Pure hypercholesterolemia, unspecified: Secondary | ICD-10-CM | POA: Diagnosis not present

## 2021-02-03 DIAGNOSIS — E1169 Type 2 diabetes mellitus with other specified complication: Secondary | ICD-10-CM | POA: Diagnosis not present

## 2021-02-03 DIAGNOSIS — N184 Chronic kidney disease, stage 4 (severe): Secondary | ICD-10-CM | POA: Diagnosis not present

## 2021-02-03 DIAGNOSIS — E1121 Type 2 diabetes mellitus with diabetic nephropathy: Secondary | ICD-10-CM | POA: Diagnosis not present

## 2021-02-03 DIAGNOSIS — I5020 Unspecified systolic (congestive) heart failure: Secondary | ICD-10-CM | POA: Diagnosis not present

## 2021-02-07 ENCOUNTER — Ambulatory Visit (INDEPENDENT_AMBULATORY_CARE_PROVIDER_SITE_OTHER): Payer: Medicare Other

## 2021-02-07 ENCOUNTER — Other Ambulatory Visit: Payer: Self-pay

## 2021-02-07 DIAGNOSIS — I483 Typical atrial flutter: Secondary | ICD-10-CM | POA: Diagnosis not present

## 2021-02-07 DIAGNOSIS — Z7901 Long term (current) use of anticoagulants: Secondary | ICD-10-CM

## 2021-02-07 LAB — POCT INR: INR: 2.6 (ref 2.0–3.0)

## 2021-02-07 NOTE — Patient Instructions (Signed)
Continue taking Warfarin 1/2 tablet daily except for 1 tablet on Monday, Wednesday and Fridays. Recheck INR in 6 weeks. Call coumadin clinic for any changes in medications or upcoming procedures. [phone].

## 2021-02-19 DIAGNOSIS — H1840 Unspecified corneal degeneration: Secondary | ICD-10-CM | POA: Diagnosis not present

## 2021-02-19 DIAGNOSIS — H25813 Combined forms of age-related cataract, bilateral: Secondary | ICD-10-CM | POA: Diagnosis not present

## 2021-03-06 DIAGNOSIS — N2581 Secondary hyperparathyroidism of renal origin: Secondary | ICD-10-CM | POA: Diagnosis not present

## 2021-03-06 DIAGNOSIS — I10 Essential (primary) hypertension: Secondary | ICD-10-CM | POA: Diagnosis not present

## 2021-03-06 DIAGNOSIS — E7800 Pure hypercholesterolemia, unspecified: Secondary | ICD-10-CM | POA: Diagnosis not present

## 2021-03-06 DIAGNOSIS — N1832 Chronic kidney disease, stage 3b: Secondary | ICD-10-CM | POA: Diagnosis not present

## 2021-03-06 DIAGNOSIS — I5020 Unspecified systolic (congestive) heart failure: Secondary | ICD-10-CM | POA: Diagnosis not present

## 2021-03-06 DIAGNOSIS — D631 Anemia in chronic kidney disease: Secondary | ICD-10-CM | POA: Diagnosis not present

## 2021-03-06 DIAGNOSIS — E1122 Type 2 diabetes mellitus with diabetic chronic kidney disease: Secondary | ICD-10-CM | POA: Diagnosis not present

## 2021-03-11 DIAGNOSIS — I5020 Unspecified systolic (congestive) heart failure: Secondary | ICD-10-CM | POA: Diagnosis not present

## 2021-03-11 DIAGNOSIS — N2581 Secondary hyperparathyroidism of renal origin: Secondary | ICD-10-CM | POA: Diagnosis not present

## 2021-03-11 DIAGNOSIS — E1121 Type 2 diabetes mellitus with diabetic nephropathy: Secondary | ICD-10-CM | POA: Diagnosis not present

## 2021-03-11 DIAGNOSIS — I129 Hypertensive chronic kidney disease with stage 1 through stage 4 chronic kidney disease, or unspecified chronic kidney disease: Secondary | ICD-10-CM | POA: Diagnosis not present

## 2021-03-11 DIAGNOSIS — N184 Chronic kidney disease, stage 4 (severe): Secondary | ICD-10-CM | POA: Diagnosis not present

## 2021-03-11 DIAGNOSIS — E7800 Pure hypercholesterolemia, unspecified: Secondary | ICD-10-CM | POA: Diagnosis not present

## 2021-03-14 DIAGNOSIS — Z03818 Encounter for observation for suspected exposure to other biological agents ruled out: Secondary | ICD-10-CM | POA: Diagnosis not present

## 2021-03-14 DIAGNOSIS — R051 Acute cough: Secondary | ICD-10-CM | POA: Diagnosis not present

## 2021-03-26 ENCOUNTER — Ambulatory Visit (HOSPITAL_COMMUNITY)
Admission: EM | Admit: 2021-03-26 | Discharge: 2021-03-26 | Disposition: A | Discharge status: Home / Self Care | Payer: Medicare Other | Attending: Family Medicine | Admitting: Family Medicine

## 2021-03-26 ENCOUNTER — Other Ambulatory Visit: Payer: Self-pay

## 2021-03-26 DIAGNOSIS — Z20822 Contact with and (suspected) exposure to covid-19: Secondary | ICD-10-CM | POA: Insufficient documentation

## 2021-03-26 NOTE — ED Triage Notes (Signed)
Pt presents for COVID testing. Denies sxs.

## 2021-03-27 LAB — SARS CORONAVIRUS 2 (TAT 6-24 HRS): SARS Coronavirus 2: POSITIVE — AB

## 2021-04-01 ENCOUNTER — Ambulatory Visit (INDEPENDENT_AMBULATORY_CARE_PROVIDER_SITE_OTHER): Payer: Medicare Other

## 2021-04-01 DIAGNOSIS — I428 Other cardiomyopathies: Secondary | ICD-10-CM

## 2021-04-04 ENCOUNTER — Telehealth: Payer: Self-pay | Admitting: Cardiology

## 2021-04-04 ENCOUNTER — Ambulatory Visit (INDEPENDENT_AMBULATORY_CARE_PROVIDER_SITE_OTHER): Payer: Medicare Other

## 2021-04-04 ENCOUNTER — Other Ambulatory Visit: Payer: Self-pay

## 2021-04-04 DIAGNOSIS — Z5181 Encounter for therapeutic drug level monitoring: Secondary | ICD-10-CM | POA: Diagnosis not present

## 2021-04-04 DIAGNOSIS — I483 Typical atrial flutter: Secondary | ICD-10-CM | POA: Diagnosis not present

## 2021-04-04 DIAGNOSIS — Z7901 Long term (current) use of anticoagulants: Secondary | ICD-10-CM

## 2021-04-04 LAB — CUP PACEART REMOTE DEVICE CHECK
Battery Remaining Longevity: 18 mo
Battery Voltage: 2.93 V
Brady Statistic AP VP Percent: 93.72 %
Brady Statistic AP VS Percent: 0.11 %
Brady Statistic AS VP Percent: 6.11 %
Brady Statistic AS VS Percent: 0.06 %
Brady Statistic RA Percent Paced: 93.31 %
Brady Statistic RV Percent Paced: 99.28 %
Date Time Interrogation Session: 20221230003526
HighPow Impedance: 62 Ohm
Implantable Lead Implant Date: 20191023
Implantable Lead Implant Date: 20191023
Implantable Lead Implant Date: 20191023
Implantable Lead Location: 753858
Implantable Lead Location: 753859
Implantable Lead Location: 753860
Implantable Lead Model: 4598
Implantable Lead Model: 5076
Implantable Lead Model: 6935
Implantable Pulse Generator Implant Date: 20191023
Lead Channel Impedance Value: 171 Ohm
Lead Channel Impedance Value: 180 Ohm
Lead Channel Impedance Value: 180 Ohm
Lead Channel Impedance Value: 180 Ohm
Lead Channel Impedance Value: 190 Ohm
Lead Channel Impedance Value: 323 Ohm
Lead Channel Impedance Value: 342 Ohm
Lead Channel Impedance Value: 342 Ohm
Lead Channel Impedance Value: 380 Ohm
Lead Channel Impedance Value: 380 Ohm
Lead Channel Impedance Value: 399 Ohm
Lead Channel Impedance Value: 437 Ohm
Lead Channel Impedance Value: 494 Ohm
Lead Channel Impedance Value: 589 Ohm
Lead Channel Impedance Value: 589 Ohm
Lead Channel Impedance Value: 627 Ohm
Lead Channel Impedance Value: 627 Ohm
Lead Channel Impedance Value: 627 Ohm
Lead Channel Pacing Threshold Amplitude: 0.625 V
Lead Channel Pacing Threshold Amplitude: 1 V
Lead Channel Pacing Threshold Amplitude: 1.375 V
Lead Channel Pacing Threshold Pulse Width: 0.4 ms
Lead Channel Pacing Threshold Pulse Width: 0.4 ms
Lead Channel Pacing Threshold Pulse Width: 0.8 ms
Lead Channel Sensing Intrinsic Amplitude: 14.625 mV
Lead Channel Sensing Intrinsic Amplitude: 14.625 mV
Lead Channel Sensing Intrinsic Amplitude: 2.625 mV
Lead Channel Sensing Intrinsic Amplitude: 2.625 mV
Lead Channel Setting Pacing Amplitude: 1.5 V
Lead Channel Setting Pacing Amplitude: 2 V
Lead Channel Setting Pacing Amplitude: 3.25 V
Lead Channel Setting Pacing Pulse Width: 0.4 ms
Lead Channel Setting Pacing Pulse Width: 0.8 ms
Lead Channel Setting Sensing Sensitivity: 0.3 mV

## 2021-04-04 LAB — POCT INR: INR: 1.2 — AB (ref 2.0–3.0)

## 2021-04-04 NOTE — Patient Instructions (Signed)
-   take 2 tablets warfarin tonight, 1.5 tomorrow, then  - on Sunday, resume taking Warfarin 1/2 tablet daily except for 1 tablet on Monday, Wednesday and Fridays.  - Recheck INR in 4 weeks.  Call coumadin clinic for any changes in medications or upcoming procedures. [phone].

## 2021-04-04 NOTE — Telephone Encounter (Signed)
Patient states he is returning a call, but he is unsure of who called. He states the call was received a few days ago, but he just returned to town. I do not see any updated notes or recent results. Patient assumes the call was regarding his ICD. [facility], but was put on hold for several minutes. Please return call to patient when able.

## 2021-04-04 NOTE — Telephone Encounter (Signed)
Spoke with patient informed him that his transmission was received on 04/04/21 and that it was normal patient voiced understanding

## 2021-04-08 DIAGNOSIS — I5020 Unspecified systolic (congestive) heart failure: Secondary | ICD-10-CM | POA: Diagnosis not present

## 2021-04-08 DIAGNOSIS — E7800 Pure hypercholesterolemia, unspecified: Secondary | ICD-10-CM | POA: Diagnosis not present

## 2021-04-08 DIAGNOSIS — E1121 Type 2 diabetes mellitus with diabetic nephropathy: Secondary | ICD-10-CM | POA: Diagnosis not present

## 2021-04-08 DIAGNOSIS — N184 Chronic kidney disease, stage 4 (severe): Secondary | ICD-10-CM | POA: Diagnosis not present

## 2021-04-08 DIAGNOSIS — I129 Hypertensive chronic kidney disease with stage 1 through stage 4 chronic kidney disease, or unspecified chronic kidney disease: Secondary | ICD-10-CM | POA: Diagnosis not present

## 2021-04-08 DIAGNOSIS — N2581 Secondary hyperparathyroidism of renal origin: Secondary | ICD-10-CM | POA: Diagnosis not present

## 2021-04-09 NOTE — Progress Notes (Signed)
Remote ICD transmission.   

## 2021-04-15 DIAGNOSIS — E7800 Pure hypercholesterolemia, unspecified: Secondary | ICD-10-CM | POA: Diagnosis not present

## 2021-04-15 DIAGNOSIS — I5020 Unspecified systolic (congestive) heart failure: Secondary | ICD-10-CM | POA: Diagnosis not present

## 2021-04-15 DIAGNOSIS — E1121 Type 2 diabetes mellitus with diabetic nephropathy: Secondary | ICD-10-CM | POA: Diagnosis not present

## 2021-04-15 DIAGNOSIS — N184 Chronic kidney disease, stage 4 (severe): Secondary | ICD-10-CM | POA: Diagnosis not present

## 2021-04-15 DIAGNOSIS — D696 Thrombocytopenia, unspecified: Secondary | ICD-10-CM | POA: Diagnosis not present

## 2021-04-15 DIAGNOSIS — I129 Hypertensive chronic kidney disease with stage 1 through stage 4 chronic kidney disease, or unspecified chronic kidney disease: Secondary | ICD-10-CM | POA: Diagnosis not present

## 2021-04-15 DIAGNOSIS — I483 Typical atrial flutter: Secondary | ICD-10-CM | POA: Diagnosis not present

## 2021-05-05 ENCOUNTER — Other Ambulatory Visit: Payer: Self-pay | Admitting: Cardiology

## 2021-05-05 NOTE — Telephone Encounter (Signed)
Prescription refill request received for warfarin Lov: 07/18/20 Lovena Le)  Next INR check: 05/02/21 Warfarin tablet strength: 5mg   Pt overdue for Anticoagulation Visit. Called to re-schedule, no answer. LMOM

## 2021-05-07 ENCOUNTER — Other Ambulatory Visit: Payer: Self-pay | Admitting: Cardiology

## 2021-05-07 DIAGNOSIS — I4819 Other persistent atrial fibrillation: Secondary | ICD-10-CM

## 2021-05-07 NOTE — Telephone Encounter (Signed)
Prescription refill request received for warfarin Lov: 07/18/20 Lovena Le)  Next INR check: 05/09/21 Warfarin tablet strength: 5mg   Pt overdue for INR check. Will refill after appt on 05/09/21

## 2021-05-07 NOTE — Telephone Encounter (Signed)
Called pt and scheduled an appt for Anticoagulation visit on 05/09/21. Pt stated he just received a refill of Warfarin and dose not need more at this time.

## 2021-05-09 ENCOUNTER — Ambulatory Visit (INDEPENDENT_AMBULATORY_CARE_PROVIDER_SITE_OTHER): Payer: Medicare Other | Admitting: *Deleted

## 2021-05-09 ENCOUNTER — Other Ambulatory Visit: Payer: Self-pay

## 2021-05-09 DIAGNOSIS — Z7901 Long term (current) use of anticoagulants: Secondary | ICD-10-CM | POA: Diagnosis not present

## 2021-05-09 DIAGNOSIS — I483 Typical atrial flutter: Secondary | ICD-10-CM

## 2021-05-09 LAB — POCT INR: INR: 2.7 (ref 2.0–3.0)

## 2021-05-09 NOTE — Telephone Encounter (Signed)
Pt needs to schedule an appt with Cardiologist, noted on next CVRR Appt. He was last seen on 04/29/2020 via Telemedicine Visit by Dr. Marlou Porch and was due to f/u in 6 months per follow up instructions. Warfarin refill sent and noted on next visit to take to checkout for an appt.

## 2021-05-09 NOTE — Patient Instructions (Addendum)
Description   Continue taking Warfarin 1/2 tablet daily except for 1 tablet on Monday, Wednesday and Fridays. Recheck INR in 4 weeks. Call coumadin clinic for any changes in medications or upcoming procedures. [facility] [phone].

## 2021-05-12 DIAGNOSIS — E1121 Type 2 diabetes mellitus with diabetic nephropathy: Secondary | ICD-10-CM | POA: Diagnosis not present

## 2021-05-12 DIAGNOSIS — N184 Chronic kidney disease, stage 4 (severe): Secondary | ICD-10-CM | POA: Diagnosis not present

## 2021-05-12 DIAGNOSIS — I5020 Unspecified systolic (congestive) heart failure: Secondary | ICD-10-CM | POA: Diagnosis not present

## 2021-05-12 DIAGNOSIS — E7800 Pure hypercholesterolemia, unspecified: Secondary | ICD-10-CM | POA: Diagnosis not present

## 2021-05-12 DIAGNOSIS — I129 Hypertensive chronic kidney disease with stage 1 through stage 4 chronic kidney disease, or unspecified chronic kidney disease: Secondary | ICD-10-CM | POA: Diagnosis not present

## 2021-06-06 ENCOUNTER — Other Ambulatory Visit: Payer: Self-pay

## 2021-06-06 ENCOUNTER — Ambulatory Visit (INDEPENDENT_AMBULATORY_CARE_PROVIDER_SITE_OTHER): Payer: Medicare Other | Admitting: *Deleted

## 2021-06-06 ENCOUNTER — Other Ambulatory Visit: Payer: Self-pay | Admitting: Cardiology

## 2021-06-06 DIAGNOSIS — I483 Typical atrial flutter: Secondary | ICD-10-CM

## 2021-06-06 DIAGNOSIS — Z7901 Long term (current) use of anticoagulants: Secondary | ICD-10-CM

## 2021-06-06 DIAGNOSIS — Z5181 Encounter for therapeutic drug level monitoring: Secondary | ICD-10-CM

## 2021-06-06 LAB — POCT INR: INR: 5.5 — AB (ref 2.0–3.0)

## 2021-06-06 NOTE — Patient Instructions (Signed)
Description   ?Hold warfarin today and tomorrow.  ?Then START taking warfarin 1/2 a tablet daily except for 1 tablet on Tuesday and Saturday. Eat an extra serving of greens tonight. Recheck INR in 1.5 weeks. [facility] Clinic [phone].  ?  ? ? ?

## 2021-06-11 ENCOUNTER — Ambulatory Visit: Payer: Medicare Other | Admitting: Cardiology

## 2021-06-16 ENCOUNTER — Ambulatory Visit (INDEPENDENT_AMBULATORY_CARE_PROVIDER_SITE_OTHER): Payer: Medicare Other

## 2021-06-16 ENCOUNTER — Other Ambulatory Visit: Payer: Self-pay

## 2021-06-16 DIAGNOSIS — Z7901 Long term (current) use of anticoagulants: Secondary | ICD-10-CM

## 2021-06-16 DIAGNOSIS — I483 Typical atrial flutter: Secondary | ICD-10-CM | POA: Diagnosis not present

## 2021-06-16 LAB — POCT INR: INR: 3 (ref 2.0–3.0)

## 2021-06-16 NOTE — Patient Instructions (Signed)
Description   ?Continue on same dosage of Warfarin 1/2 a tablet daily except for 1 tablet on Tuesdays and Saturdays. Eat an extra serving of greens tonight. Recheck INR in 2 weeks. [facility] [phone].  ?  ?  ?

## 2021-06-25 ENCOUNTER — Other Ambulatory Visit: Payer: Self-pay | Admitting: Cardiology

## 2021-06-26 DIAGNOSIS — N184 Chronic kidney disease, stage 4 (severe): Secondary | ICD-10-CM | POA: Diagnosis not present

## 2021-06-26 DIAGNOSIS — E1121 Type 2 diabetes mellitus with diabetic nephropathy: Secondary | ICD-10-CM | POA: Diagnosis not present

## 2021-06-26 DIAGNOSIS — E7800 Pure hypercholesterolemia, unspecified: Secondary | ICD-10-CM | POA: Diagnosis not present

## 2021-06-26 DIAGNOSIS — I129 Hypertensive chronic kidney disease with stage 1 through stage 4 chronic kidney disease, or unspecified chronic kidney disease: Secondary | ICD-10-CM | POA: Diagnosis not present

## 2021-06-30 ENCOUNTER — Other Ambulatory Visit: Payer: Self-pay

## 2021-06-30 ENCOUNTER — Ambulatory Visit (INDEPENDENT_AMBULATORY_CARE_PROVIDER_SITE_OTHER): Payer: Medicare Other

## 2021-06-30 DIAGNOSIS — I483 Typical atrial flutter: Secondary | ICD-10-CM | POA: Diagnosis not present

## 2021-06-30 DIAGNOSIS — Z7901 Long term (current) use of anticoagulants: Secondary | ICD-10-CM

## 2021-06-30 LAB — POCT INR: INR: 2.7 (ref 2.0–3.0)

## 2021-06-30 NOTE — Patient Instructions (Addendum)
Description   ?Continue on same dosage of Warfarin 1/2 a tablet daily except for 1 tablet on Tuesdays and Saturdays. Stay consistent with greens each week.  ?Recheck INR in 2 weeks.  ?[facility] Clinic [phone].  ?  ?   ?

## 2021-07-01 ENCOUNTER — Ambulatory Visit (INDEPENDENT_AMBULATORY_CARE_PROVIDER_SITE_OTHER): Payer: Medicare Other

## 2021-07-01 DIAGNOSIS — I472 Ventricular tachycardia, unspecified: Secondary | ICD-10-CM

## 2021-07-01 LAB — CUP PACEART REMOTE DEVICE CHECK
Battery Remaining Longevity: 17 mo
Battery Voltage: 2.92 V
Brady Statistic AP VP Percent: 93.61 %
Brady Statistic AP VS Percent: 0.11 %
Brady Statistic AS VP Percent: 6.24 %
Brady Statistic AS VS Percent: 0.04 %
Brady Statistic RA Percent Paced: 93.11 %
Brady Statistic RV Percent Paced: 99.13 %
Date Time Interrogation Session: 20230328022603
HighPow Impedance: 60 Ohm
Implantable Lead Implant Date: 20191023
Implantable Lead Implant Date: 20191023
Implantable Lead Implant Date: 20191023
Implantable Lead Location: 753858
Implantable Lead Location: 753859
Implantable Lead Location: 753860
Implantable Lead Model: 4598
Implantable Lead Model: 5076
Implantable Lead Model: 6935
Implantable Pulse Generator Implant Date: 20191023
Lead Channel Impedance Value: 156.606
Lead Channel Impedance Value: 156.606
Lead Channel Impedance Value: 160.941
Lead Channel Impedance Value: 166.114
Lead Channel Impedance Value: 166.114
Lead Channel Impedance Value: 304 Ohm
Lead Channel Impedance Value: 304 Ohm
Lead Channel Impedance Value: 323 Ohm
Lead Channel Impedance Value: 323 Ohm
Lead Channel Impedance Value: 342 Ohm
Lead Channel Impedance Value: 380 Ohm
Lead Channel Impedance Value: 399 Ohm
Lead Channel Impedance Value: 437 Ohm
Lead Channel Impedance Value: 532 Ohm
Lead Channel Impedance Value: 532 Ohm
Lead Channel Impedance Value: 532 Ohm
Lead Channel Impedance Value: 570 Ohm
Lead Channel Impedance Value: 570 Ohm
Lead Channel Pacing Threshold Amplitude: 0.75 V
Lead Channel Pacing Threshold Amplitude: 1.125 V
Lead Channel Pacing Threshold Amplitude: 1.125 V
Lead Channel Pacing Threshold Pulse Width: 0.4 ms
Lead Channel Pacing Threshold Pulse Width: 0.4 ms
Lead Channel Pacing Threshold Pulse Width: 0.8 ms
Lead Channel Sensing Intrinsic Amplitude: 12.625 mV
Lead Channel Sensing Intrinsic Amplitude: 12.625 mV
Lead Channel Sensing Intrinsic Amplitude: 2.375 mV
Lead Channel Sensing Intrinsic Amplitude: 2.375 mV
Lead Channel Setting Pacing Amplitude: 1.5 V
Lead Channel Setting Pacing Amplitude: 2.25 V
Lead Channel Setting Pacing Amplitude: 3.25 V
Lead Channel Setting Pacing Pulse Width: 0.4 ms
Lead Channel Setting Pacing Pulse Width: 0.8 ms
Lead Channel Setting Sensing Sensitivity: 0.3 mV

## 2021-07-11 ENCOUNTER — Other Ambulatory Visit: Payer: Self-pay | Admitting: Cardiology

## 2021-07-11 DIAGNOSIS — I4819 Other persistent atrial fibrillation: Secondary | ICD-10-CM

## 2021-07-11 NOTE — Progress Notes (Signed)
? ? ?Electrophysiology Office Note ?Date: 07/15/2021 ? ?ID:  Carlos Gibson, DOB 09-01-1940, MRN [id number] ? ?PCP: [name], MD ?Primary Cardiologist: [name], MD ?Electrophysiologist: [name], MD  ? ?CC: Routine ICD follow-up ? ?Carlos Gibson is a 81 y.o. male seen today for [name], MD for routine electrophysiology followup.  Since last being seen in our clinic the patient reports doing well overall.  he denies chest pain, palpitations, dyspnea, PND, orthopnea, nausea, vomiting, dizziness, syncope, edema, weight gain, or early satiety. He has not had ICD shocks.  ? ?Device History: ?Medtronic BiV ICD implanted 01/2018 for chronic systolic CHF ?History of appropriate therapy: Yes ?History of AAD therapy: Yes; currently on amiodarone  ? ?Past Medical History:  ?Diagnosis Date  ? Aortic regurgitation   ? a. mild AI by echo 01/2016.  ? Aortic root dilation (HCC)   ? Aortic valve regurgitation   ? Bifascicular block   ? Chronic systolic CHF (congestive heart failure) ([place])   ? CKD (chronic kidney disease), stage III ([place]) 01/01/2016  ? Diabetes ([place])   ? Ejection fraction < 50%   ? Frequent PVCs   ? Gilbert's syndrome   ? Hyperlipidemia   ? Mild diastolic dysfunction   ? Mitral regurgitation   ? a. mod by echo 10/207.  ? NICM (nonischemic cardiomyopathy) ([place])   ? Pericardial effusion   ? a. small by echo 01/2016.  ? Pulmonary hypertension ([place])   ? Right BBB/left post fasc block   ? Thrombocytopenia ([place])   ? ?Past Surgical History:  ?Procedure Laterality Date  ? BIV ICD INSERTION CRT-D N/A 01/26/2018  ? Procedure: BIV ICD INSERTION CRT-D;  Surgeon: Constance Haw, MD;  Location: [facility];  Service: Cardiovascular;  Laterality: N/A;  ? CARDIAC CATHETERIZATION N/A 12/20/2014  ? Procedure: Right/Left Heart Cath and Coronary Angiography;  Surgeon: Jerline Pain, MD;  Location: [facility];  Service: Cardiovascular;  Laterality: N/A;  ? CARDIOVERSION N/A 10/24/2018  ?  Procedure: CARDIOVERSION;  Surgeon: Thayer Headings, MD;  Location: [facility];  Service: Cardiovascular;  Laterality: N/A;  ? HERNIA REPAIR  06/2017  ? ? ?Current Outpatient Medications  ?Medication Sig Dispense Refill  ? acetaminophen (TYLENOL) 650 MG CR tablet Take 650 mg by mouth every 8 (eight) hours as needed for pain.    ? alfuzosin (UROXATRAL) 10 MG 24 hr tablet Take 10 mg by mouth at bedtime.   3  ? amiodarone (PACERONE) 200 MG tablet Take 1 tablet (200 mg total) by mouth daily. Pt needs to schedule appt with provider for further refills,  can only ok 15-day supply - 2nd attempt 15 tablet 0  ? atorvastatin (LIPITOR) 10 MG tablet Take 10 mg by mouth at bedtime.    ? bisoprolol (ZEBETA) 5 MG tablet Take 1 tablet (5 mg total) by mouth daily. 90 tablet 3  ? Cholecalciferol (VITAMIN D3) 25 MCG (1000 UT) CAPS Take 1,000 Units by mouth every other day.     ? finasteride (PROSCAR) 5 MG tablet Take 5 mg by mouth daily.   2  ? furosemide (LASIX) 40 MG tablet Take 40 mg by mouth as needed for fluid or edema.    ? glimepiride (AMARYL) 2 MG tablet Take 2 mg by mouth at bedtime.    ? hydrALAZINE (APRESOLINE) 25 MG tablet Take 25 mg by mouth 2 (two) times daily.    ? isosorbide mononitrate (IMDUR) 30 MG 24 hr tablet Take 30  mg by mouth daily.    ? potassium chloride (KLOR-CON) 10 MEQ tablet TAKE ONE TABLET BY MOUTH ONCE DAILY 15 tablet 0  ? Semaglutide (RYBELSUS) 7 MG TABS 1 tablet at least 30 minutes before first food, beverage or other oral medicine of the day    ? traMADol (ULTRAM) 50 MG tablet Take by mouth every 6 (six) hours as needed.    ? warfarin (COUMADIN) 5 MG tablet TAKE 1/2 TO 1 TABLET BY MOUTH DAILY AS DIRECTED BY ANTICOAGULATION CLINIC. 30 tablet 1  ? ?No current facility-administered medications for this visit.  ? ? ?Allergies:   Ace inhibitors, Coreg [carvedilol], and Toprol xl [metoprolol tartrate]  ? ?Social History: ?Social History  ? ?Socioeconomic History  ? Marital status: Married  ?  Spouse  name: Not on file  ? Number of children: Not on file  ? Years of education: Not on file  ? Highest education level: Not on file  ?Occupational History  ? Not on file  ?Tobacco Use  ? Smoking status: Never  ? Smokeless tobacco: Never  ?Vaping Use  ? Vaping Use: Never used  ?Substance and Sexual Activity  ? Alcohol use: No  ?  Alcohol/week: 0.0 standard drinks  ? Drug use: No  ? Sexual activity: Not on file  ?Other Topics Concern  ? Not on file  ?Social History Narrative  ? Not on file  ? ?Social Determinants of Health  ? ?Financial Resource Strain: Not on file  ?Food Insecurity: Not on file  ?Transportation Needs: Not on file  ?Physical Activity: Not on file  ?Stress: Not on file  ?Social Connections: Not on file  ?Intimate Partner Violence: Not on file  ? ? ?Family History: ?Family History  ?Problem Relation Age of Onset  ? Hypertension Mother   ? Stroke Mother   ? Irregular heart beat Father   ? Unexplained death Father   ? ? ?Review of Systems: ?All other systems reviewed and are otherwise negative except as noted above. ? ? ?Physical Exam: ?Vitals:  ? 07/15/21 1125  ?BP: (!) 98/58  ?Pulse: 70  ?SpO2: 96%  ?Weight: 184 lb (83.5 kg)  ?Height: 5\' 11"  (1.803 m)  ?  ? ?GEN- The patient is well appearing, alert and oriented x 3 today.   ?HEENT: normocephalic, atraumatic; sclera clear, conjunctiva pink; hearing intact; oropharynx clear; neck supple, no JVP ?Lymph- no cervical lymphadenopathy ?Lungs- Clear to ausculation bilaterally, normal work of breathing.  No wheezes, rales, rhonchi ?Heart- Regular rate and rhythm, no murmurs, rubs or gallops, PMI not laterally displaced ?GI- soft, non-tender, non-distended, bowel sounds present, no hepatosplenomegaly ?Extremities- no clubbing or cyanosis. No edema; DP/PT/radial pulses 2+ bilaterally ?MS- no significant deformity or atrophy ?Skin- warm and dry, no rash or lesion; ICD pocket well healed ?Psych- euthymic mood, full affect ?Neuro- strength and sensation are  intact ? ?ICD interrogation- reviewed in detail today,  See PACEART report ? ?EKG:  EKG is ordered today. ?Personal review of EKG ordered today shows AV dual (BiV pacing) at 70 bpm ? ?Recent Labs: ?No results found for requested labs within last 8760 hours.  ? ?Wt Readings from Last 3 Encounters:  ?07/15/21 184 lb (83.5 kg)  ?04/29/20 180 lb (81.6 kg)  ?02/16/20 186 lb (84.4 kg)  ?  ? ?Other studies Reviewed: ?Additional studies/ records that were reviewed today include: Previous EP office notes.  ? ?Assessment and Plan: ? ?1.  Chronic systolic dysfunction s/p Medtronic CRT-D  ?euvolemic today ?Stable on an  appropriate medical regimen ?Normal ICD function ?See Claudia Desanctis Art report ?No changes today ? ?2. HTN ?Stable on current regimen  ? ?3. VT ?Quiescent on amiodarone ?Surveillance labs today.  ? ?4. CKD III ?Labs today on Spiro and losartan. ? ?Current medicines are reviewed at length with the patient today.   ? ?Labs/ tests ordered today include:  ?Orders Placed This Encounter  ?Procedures  ? Comprehensive metabolic panel  ? CBC  ? TSH  ? T4, free  ? EKG 12-Lead  ? ? ?Disposition:   Follow up with Dr. Curt Bears in 6 months  ? ? ?Signed, ?Shirley Friar, PA-C  ?07/15/2021 ?11:51 AM ? ?[facility] ?[address] ?[address] ?[place] [address] ?[phone] (office) ?[phone] (fax)  ?

## 2021-07-11 NOTE — Telephone Encounter (Signed)
Prescription refill request received for warfarin ?Lov: 04/29/20 Carlos Gibson) ?Next INR check: 07/15/21 ?Warfarin tablet strength: 5mg  ? ?Overdue for office visit. Pt has scheduled appt with [facility] next week on 07/15/21. Appropriate dose and refill sent to requested pharmacy.  ? ? ?

## 2021-07-14 NOTE — Progress Notes (Signed)
Remote ICD transmission.   

## 2021-07-15 ENCOUNTER — Ambulatory Visit (INDEPENDENT_AMBULATORY_CARE_PROVIDER_SITE_OTHER): Payer: Medicare Other

## 2021-07-15 ENCOUNTER — Ambulatory Visit (INDEPENDENT_AMBULATORY_CARE_PROVIDER_SITE_OTHER): Payer: Medicare Other | Admitting: Student

## 2021-07-15 ENCOUNTER — Encounter: Payer: Self-pay | Admitting: Student

## 2021-07-15 VITALS — BP 98/58 | HR 70 | Ht 71.0 in | Wt 184.0 lb

## 2021-07-15 DIAGNOSIS — I10 Essential (primary) hypertension: Secondary | ICD-10-CM

## 2021-07-15 DIAGNOSIS — I472 Ventricular tachycardia, unspecified: Secondary | ICD-10-CM | POA: Diagnosis not present

## 2021-07-15 DIAGNOSIS — I428 Other cardiomyopathies: Secondary | ICD-10-CM | POA: Diagnosis not present

## 2021-07-15 DIAGNOSIS — Z7901 Long term (current) use of anticoagulants: Secondary | ICD-10-CM

## 2021-07-15 DIAGNOSIS — I483 Typical atrial flutter: Secondary | ICD-10-CM

## 2021-07-15 LAB — COMPREHENSIVE METABOLIC PANEL
ALT: 27 IU/L (ref 0–44)
AST: 25 IU/L (ref 0–40)
Albumin/Globulin Ratio: 1.9 (ref 1.2–2.2)
Albumin: 4 g/dL (ref 3.7–4.7)
Alkaline Phosphatase: 65 IU/L (ref 44–121)
BUN/Creatinine Ratio: 9 — ABNORMAL LOW (ref 10–24)
BUN: 16 mg/dL (ref 8–27)
Bilirubin Total: 1.4 mg/dL — ABNORMAL HIGH (ref 0.0–1.2)
CO2: 26 mmol/L (ref 20–29)
Calcium: 9.1 mg/dL (ref 8.6–10.2)
Chloride: 102 mmol/L (ref 96–106)
Creatinine, Ser: 1.81 mg/dL — ABNORMAL HIGH (ref 0.76–1.27)
Globulin, Total: 2.1 g/dL (ref 1.5–4.5)
Glucose: 122 mg/dL — ABNORMAL HIGH (ref 70–99)
Potassium: 4.2 mmol/L (ref 3.5–5.2)
Sodium: 139 mmol/L (ref 134–144)
Total Protein: 6.1 g/dL (ref 6.0–8.5)
eGFR: 37 mL/min/{1.73_m2} — ABNORMAL LOW (ref >59)

## 2021-07-15 LAB — CUP PACEART INCLINIC DEVICE CHECK
Battery Remaining Longevity: 17 mo
Battery Voltage: 2.92 V
Brady Statistic AP VP Percent: 95.47 %
Brady Statistic AP VS Percent: 0.1 %
Brady Statistic AS VP Percent: 4.39 %
Brady Statistic AS VS Percent: 0.04 %
Brady Statistic RA Percent Paced: 95.23 %
Brady Statistic RV Percent Paced: 99.4 %
Date Time Interrogation Session: 20230411115156
HighPow Impedance: 62 Ohm
Implantable Lead Implant Date: 20191023
Implantable Lead Implant Date: 20191023
Implantable Lead Implant Date: 20191023
Implantable Lead Location: 753858
Implantable Lead Location: 753859
Implantable Lead Location: 753860
Implantable Lead Model: 4598
Implantable Lead Model: 5076
Implantable Lead Model: 6935
Implantable Pulse Generator Implant Date: 20191023
Lead Channel Impedance Value: 161.5 Ohm
Lead Channel Impedance Value: 166.114
Lead Channel Impedance Value: 166.114
Lead Channel Impedance Value: 174.595
Lead Channel Impedance Value: 174.595
Lead Channel Impedance Value: 323 Ohm
Lead Channel Impedance Value: 323 Ohm
Lead Channel Impedance Value: 323 Ohm
Lead Channel Impedance Value: 342 Ohm
Lead Channel Impedance Value: 380 Ohm
Lead Channel Impedance Value: 456 Ohm
Lead Channel Impedance Value: 456 Ohm
Lead Channel Impedance Value: 456 Ohm
Lead Channel Impedance Value: 570 Ohm
Lead Channel Impedance Value: 570 Ohm
Lead Channel Impedance Value: 589 Ohm
Lead Channel Impedance Value: 589 Ohm
Lead Channel Impedance Value: 589 Ohm
Lead Channel Pacing Threshold Amplitude: 0.75 V
Lead Channel Pacing Threshold Amplitude: 1 V
Lead Channel Pacing Threshold Amplitude: 1.25 V
Lead Channel Pacing Threshold Pulse Width: 0.4 ms
Lead Channel Pacing Threshold Pulse Width: 0.4 ms
Lead Channel Pacing Threshold Pulse Width: 0.8 ms
Lead Channel Sensing Intrinsic Amplitude: 13.875 mV
Lead Channel Sensing Intrinsic Amplitude: 15.125 mV
Lead Channel Sensing Intrinsic Amplitude: 2.375 mV
Lead Channel Sensing Intrinsic Amplitude: 2.5 mV
Lead Channel Setting Pacing Amplitude: 1.5 V
Lead Channel Setting Pacing Amplitude: 2 V
Lead Channel Setting Pacing Amplitude: 3.25 V
Lead Channel Setting Pacing Pulse Width: 0.4 ms
Lead Channel Setting Pacing Pulse Width: 0.8 ms
Lead Channel Setting Sensing Sensitivity: 0.3 mV

## 2021-07-15 LAB — CBC
Hematocrit: 39.1 % (ref 37.5–51.0)
Hemoglobin: 13.3 g/dL (ref 13.0–17.7)
MCH: 29 pg (ref 26.6–33.0)
MCHC: 34 g/dL (ref 31.5–35.7)
MCV: 85 fL (ref 79–97)
Platelets: 119 10*3/uL — ABNORMAL LOW (ref 150–450)
RBC: 4.59 x10E6/uL (ref 4.14–5.80)
RDW: 14.1 % (ref 11.6–15.4)
WBC: 3.2 10*3/uL — ABNORMAL LOW (ref 3.4–10.8)

## 2021-07-15 LAB — TSH: TSH: 11.6 u[IU]/mL — ABNORMAL HIGH (ref 0.450–4.500)

## 2021-07-15 LAB — T4, FREE: Free T4: 1.32 ng/dL (ref 0.82–1.77)

## 2021-07-15 LAB — POCT INR: INR: 2.6 (ref 2.0–3.0)

## 2021-07-15 NOTE — Patient Instructions (Signed)
Description   ?Continue on same dosage of Warfarin 1/2 a tablet daily except for 1 tablet on Tuesdays and Saturdays. Stay consistent with greens each week. Recheck INR in 3 weeks. Coumadin Clinic [phone].  ?  ?  ?

## 2021-07-15 NOTE — Patient Instructions (Signed)
Medication Instructions:  ?Your physician recommends that you continue on your current medications as directed. Please refer to the Current Medication list given to you today. ? ?*If you need a refill on your cardiac medications before your next appointment, please call your pharmacy* ? ? ?Lab Work: ?TODAY: CMET, CBC, TSH, FreeT4 ? ?If you have labs (blood work) drawn today and your tests are completely normal, you will receive your results only by: ?MyChart Message (if you have MyChart) OR ?A paper copy in the mail ?If you have any lab test that is abnormal or we need to change your treatment, we will call you to review the results. ? ? ?Follow-Up: ?At [facility], you and your health needs are our priority.  As part of our continuing mission to provide you with exceptional heart care, we have created designated Provider Care Teams.  These Care Teams include your primary Cardiologist (physician) and Advanced Practice Providers (APPs -  Physician Assistants and Nurse Practitioners) who all work together to provide you with the care you need, when you need it. ? ?We recommend signing up for the patient portal called "MyChart".  Sign up information is provided on this After Visit Summary.  MyChart is used to connect with patients for Virtual Visits (Telemedicine).  Patients are able to view lab/test results, encounter notes, upcoming appointments, etc.  Non-urgent messages can be sent to your provider as well.   ?To learn more about what you can do with MyChart, go to [email or web address].   ? ?Your next appointment:   ?6 month(s) ? ?The format for your next appointment:   ?In Person ? ?Provider:   ?Allegra Lai, MD  ? ?Important Information About Sugar ? ? ? ? ?  ?

## 2021-07-18 DIAGNOSIS — I129 Hypertensive chronic kidney disease with stage 1 through stage 4 chronic kidney disease, or unspecified chronic kidney disease: Secondary | ICD-10-CM | POA: Diagnosis not present

## 2021-07-18 DIAGNOSIS — N1832 Chronic kidney disease, stage 3b: Secondary | ICD-10-CM | POA: Diagnosis not present

## 2021-07-18 DIAGNOSIS — N2581 Secondary hyperparathyroidism of renal origin: Secondary | ICD-10-CM | POA: Diagnosis not present

## 2021-07-18 DIAGNOSIS — D631 Anemia in chronic kidney disease: Secondary | ICD-10-CM | POA: Diagnosis not present

## 2021-07-18 DIAGNOSIS — E1122 Type 2 diabetes mellitus with diabetic chronic kidney disease: Secondary | ICD-10-CM | POA: Diagnosis not present

## 2021-07-18 DIAGNOSIS — I5020 Unspecified systolic (congestive) heart failure: Secondary | ICD-10-CM | POA: Diagnosis not present

## 2021-08-05 ENCOUNTER — Ambulatory Visit (INDEPENDENT_AMBULATORY_CARE_PROVIDER_SITE_OTHER): Payer: Medicare Other | Admitting: *Deleted

## 2021-08-05 DIAGNOSIS — I483 Typical atrial flutter: Secondary | ICD-10-CM | POA: Diagnosis not present

## 2021-08-05 DIAGNOSIS — Z7901 Long term (current) use of anticoagulants: Secondary | ICD-10-CM

## 2021-08-05 LAB — POCT INR: INR: 2.2 (ref 2.0–3.0)

## 2021-08-05 NOTE — Patient Instructions (Signed)
Description   ?Continue taking Warfarin 1/2 tablet daily except for 1 tablet on Tuesdays and Saturdays. Stay consistent with greens each week. Recheck INR in 5 weeks. [facility] Clinic [phone].  ?  ?  ?

## 2021-08-14 ENCOUNTER — Other Ambulatory Visit: Payer: Self-pay | Admitting: Cardiology

## 2021-09-03 ENCOUNTER — Other Ambulatory Visit: Payer: Self-pay | Admitting: Cardiology

## 2021-09-03 DIAGNOSIS — I4819 Other persistent atrial fibrillation: Secondary | ICD-10-CM

## 2021-09-03 NOTE — Telephone Encounter (Signed)
Prescription refill request received for warfarin Lov: 07/15/21 Chalmers Cater)  Next INR check: 09/09/21 Warfarin tablet strength: 5mg   Appropriate dose and refill sent to requested pharmacy.

## 2021-09-09 ENCOUNTER — Ambulatory Visit (INDEPENDENT_AMBULATORY_CARE_PROVIDER_SITE_OTHER): Payer: Medicare Other

## 2021-09-09 DIAGNOSIS — I483 Typical atrial flutter: Secondary | ICD-10-CM

## 2021-09-09 DIAGNOSIS — Z5181 Encounter for therapeutic drug level monitoring: Secondary | ICD-10-CM | POA: Diagnosis not present

## 2021-09-09 DIAGNOSIS — Z7901 Long term (current) use of anticoagulants: Secondary | ICD-10-CM | POA: Diagnosis not present

## 2021-09-09 LAB — POCT INR: INR: 1.4 — AB (ref 2.0–3.0)

## 2021-09-09 NOTE — Patient Instructions (Signed)
TAKE 2 TABLETS TONIGHT ONLY and then Continue taking Warfarin 1/2 tablet daily except for 1 tablet on Tuesdays and Saturdays. Stay consistent with greens each week. Recheck INR in 2 weeks. Cou[facility]in Clinic [phone].

## 2021-09-15 ENCOUNTER — Other Ambulatory Visit: Payer: Self-pay | Admitting: Cardiology

## 2021-09-26 ENCOUNTER — Ambulatory Visit (INDEPENDENT_AMBULATORY_CARE_PROVIDER_SITE_OTHER): Payer: Medicare Other

## 2021-09-26 DIAGNOSIS — Z5181 Encounter for therapeutic drug level monitoring: Secondary | ICD-10-CM | POA: Diagnosis not present

## 2021-09-26 DIAGNOSIS — Z7901 Long term (current) use of anticoagulants: Secondary | ICD-10-CM | POA: Diagnosis not present

## 2021-09-26 DIAGNOSIS — I483 Typical atrial flutter: Secondary | ICD-10-CM | POA: Diagnosis not present

## 2021-09-26 LAB — POCT INR: INR: 2.3 (ref 2.0–3.0)

## 2021-09-30 ENCOUNTER — Ambulatory Visit (INDEPENDENT_AMBULATORY_CARE_PROVIDER_SITE_OTHER): Payer: Medicare Other

## 2021-09-30 DIAGNOSIS — I428 Other cardiomyopathies: Secondary | ICD-10-CM

## 2021-09-30 LAB — CUP PACEART REMOTE DEVICE CHECK
Battery Remaining Longevity: 14 mo
Battery Voltage: 2.91 V
Brady Statistic AP VP Percent: 49.52 %
Brady Statistic AP VS Percent: 0.08 %
Brady Statistic AS VP Percent: 49.3 %
Brady Statistic AS VS Percent: 1.1 %
Brady Statistic RA Percent Paced: 49.41 %
Brady Statistic RV Percent Paced: 98.48 %
Date Time Interrogation Session: 20230627001704
HighPow Impedance: 59 Ohm
Implantable Lead Implant Date: 20191023
Implantable Lead Implant Date: 20191023
Implantable Lead Implant Date: 20191023
Implantable Lead Location: 753858
Implantable Lead Location: 753859
Implantable Lead Location: 753860
Implantable Lead Model: 4598
Implantable Lead Model: 5076
Implantable Lead Model: 6935
Implantable Pulse Generator Implant Date: 20191023
Lead Channel Impedance Value: 156.606
Lead Channel Impedance Value: 160.941
Lead Channel Impedance Value: 166.114
Lead Channel Impedance Value: 166.114
Lead Channel Impedance Value: 171 Ohm
Lead Channel Impedance Value: 304 Ohm
Lead Channel Impedance Value: 304 Ohm
Lead Channel Impedance Value: 323 Ohm
Lead Channel Impedance Value: 323 Ohm
Lead Channel Impedance Value: 342 Ohm
Lead Channel Impedance Value: 342 Ohm
Lead Channel Impedance Value: 380 Ohm
Lead Channel Impedance Value: 399 Ohm
Lead Channel Impedance Value: 532 Ohm
Lead Channel Impedance Value: 532 Ohm
Lead Channel Impedance Value: 570 Ohm
Lead Channel Impedance Value: 570 Ohm
Lead Channel Impedance Value: 589 Ohm
Lead Channel Pacing Threshold Amplitude: 0.5 V
Lead Channel Pacing Threshold Amplitude: 0.875 V
Lead Channel Pacing Threshold Amplitude: 1.25 V
Lead Channel Pacing Threshold Pulse Width: 0.4 ms
Lead Channel Pacing Threshold Pulse Width: 0.4 ms
Lead Channel Pacing Threshold Pulse Width: 0.8 ms
Lead Channel Sensing Intrinsic Amplitude: 1.875 mV
Lead Channel Sensing Intrinsic Amplitude: 1.875 mV
Lead Channel Sensing Intrinsic Amplitude: 7.875 mV
Lead Channel Sensing Intrinsic Amplitude: 7.875 mV
Lead Channel Setting Pacing Amplitude: 1.5 V
Lead Channel Setting Pacing Amplitude: 2 V
Lead Channel Setting Pacing Amplitude: 3.25 V
Lead Channel Setting Pacing Pulse Width: 0.4 ms
Lead Channel Setting Pacing Pulse Width: 0.8 ms
Lead Channel Setting Sensing Sensitivity: 0.3 mV

## 2021-10-13 ENCOUNTER — Other Ambulatory Visit: Payer: Self-pay | Admitting: Cardiology

## 2021-10-15 ENCOUNTER — Other Ambulatory Visit: Payer: Self-pay | Admitting: *Deleted

## 2021-10-15 MED ORDER — BISOPROLOL FUMARATE 5 MG PO TABS: 5.0000 mg | ORAL_TABLET | Freq: Every day | ORAL | 3 refills | Status: DC

## 2021-10-15 NOTE — Progress Notes (Signed)
Refill for Bisoprolol 5 mg daily to [facility]

## 2021-10-20 ENCOUNTER — Ambulatory Visit (HOSPITAL_COMMUNITY): Payer: Medicare Other | Admitting: Physician Assistant

## 2021-10-21 NOTE — Progress Notes (Signed)
Remote ICD transmission.   

## 2021-10-28 ENCOUNTER — Ambulatory Visit (INDEPENDENT_AMBULATORY_CARE_PROVIDER_SITE_OTHER): Payer: Medicare Other | Admitting: Cardiology

## 2021-10-28 ENCOUNTER — Encounter: Payer: Self-pay | Admitting: Cardiology

## 2021-10-28 ENCOUNTER — Ambulatory Visit (HOSPITAL_COMMUNITY): Payer: Medicare Other | Admitting: Physician Assistant

## 2021-10-28 VITALS — BP 90/50 | HR 80 | Ht 71.0 in | Wt 175.0 lb

## 2021-10-28 DIAGNOSIS — Z7901 Long term (current) use of anticoagulants: Secondary | ICD-10-CM

## 2021-10-28 DIAGNOSIS — I351 Nonrheumatic aortic (valve) insufficiency: Secondary | ICD-10-CM | POA: Diagnosis not present

## 2021-10-28 DIAGNOSIS — I5022 Chronic systolic (congestive) heart failure: Secondary | ICD-10-CM | POA: Diagnosis not present

## 2021-10-28 DIAGNOSIS — N1832 Chronic kidney disease, stage 3b: Secondary | ICD-10-CM

## 2021-10-28 NOTE — Patient Instructions (Signed)
Medication Instructions:  Your physician recommends that you continue on your current medications as directed. Please refer to the Current Medication list given to you today.  *If you need a refill on your cardiac medications before your next appointment, please call your pharmacy*  Testing/Procedures: Your physician has requested that you have an echocardiogram. Echocardiography is a painless test that uses sound waves to create images of your heart. It provides your doctor with information about the size and shape of your heart and how well your heart's chambers and valves are working. This procedure takes approximately one hour. There are no restrictions for this procedure.  Follow-Up: At [facility], you and your health needs are our priority.  As part of our continuing mission to provide you with exceptional heart care, we have created designated Provider Care Teams.  These Care Teams include your primary Cardiologist (physician) and Advanced Practice Providers (APPs -  Physician Assistants and Nurse Practitioners) who all work together to provide you with the care you need, when you need it.  Your next appointment:   6 month(s)  The format for your next appointment:   In Person  Provider:   APP   Important Information About Sugar

## 2021-10-28 NOTE — Progress Notes (Signed)
Cardiology Office Note:    Date:  10/28/2021   ID:  WASSIM KIRKSEY, DOB [birth year], MRN [id number]  PCP:  [name], MD   [facility] Providers Cardiologist:  [name], MD Cardiology APP:  Almyra Deforest, [place]  Electrophysiologist:  [name], MD     Referring MD: [name], MD    History of Present Illness:    Carlos Gibson is a 81 y.o. male here for the follow-up cardiomyopathy, Medtronic ICD biventricular implanted in 2019 for chronic systolic heart failure.  Followed by Dr. Curt Bears.  Cardiomyopathy nonischemic.  Cardiac catheterization in 2016.  Has been on amiodarone.  Aortic root dilatation  Ejection fraction 25 to 30% with aortic root size 40 mm.  Echo 2019 reviewed.  Feeling well class I symptoms.  On Coumadin. Rare SOB with walking.     Past Medical History:  Diagnosis Date   Aortic regurgitation    a. mild AI by echo 01/2016.   Aortic root dilation (HCC)    Aortic valve regurgitation    Bifascicular block    Chronic systolic CHF (congestive heart failure) (HCC)    CKD (chronic kidney disease), stage III ([place]) 01/01/2016   Diabetes (HCC)    Ejection fraction < 50%    Frequent PVCs    Gilbert's syndrome    Hyperlipidemia    Mild diastolic dysfunction    Mitral regurgitation    a. mod by echo 10/207.   NICM (nonischemic cardiomyopathy) ([place])    Pericardial effusion    a. small by echo 01/2016.   Pulmonary hypertension ([place])    Right BBB/left post fasc block    Thrombocytopenia [facility])     Past Surgical History:  Procedure Laterality Date   BIV ICD INSERTION CRT-D N/A 01/26/2018   Procedure: BIV ICD INSERTION CRT-D;  Surgeon: Constance Haw, MD;  Location: [place] CV LAB;  Service: Cardiovascular;  Laterality: N/A;   CARDIAC CATHETERIZATION N/A 12/20/2014   Procedure: Right/Left Heart Cath and Coronary Angiography;  Surgeon: Jerline Pain, MD;  Location: [place] [facility];  Service: Cardiovascular;  Laterality: N/A;    CARDIOVERSION N/A 10/24/2018   Procedure: CARDIOVERSION;  Surgeon: Thayer Headings, MD;  Location: [facility] ENDOSCOPY;  Service: Cardiovascular;  Laterality: N/A;   HERNIA REPAIR  06/2017    Current Medications: Current Meds  Medication Sig   acetaminophen (TYLENOL) 650 MG CR tablet Take 650 mg by mouth every 8 (eight) hours as needed for pain.   alfuzosin (UROXATRAL) 10 MG 24 hr tablet Take 10 mg by mouth at bedtime.    amiodarone (PACERONE) 200 MG tablet Take 1 tablet by mouth daily. Please call and schedule appt with Dr. Marlou Porch for future refills [phone]. Thank you. 3rd attempt   atorvastatin (LIPITOR) 10 MG tablet Take 10 mg by mouth at bedtime.   bisoprolol (ZEBETA) 5 MG tablet Take 1 tablet (5 mg total) by mouth daily.   Cholecalciferol (VITAMIN D3) 25 MCG (1000 UT) CAPS Take 1,000 Units by mouth every other day.    finasteride (PROSCAR) 5 MG tablet Take 5 mg by mouth daily.    furosemide (LASIX) 40 MG tablet Take 40 mg by mouth as needed for fluid or edema.   glimepiride (AMARYL) 2 MG tablet Take 2 mg by mouth at bedtime.   hydrALAZINE (APRESOLINE) 25 MG tablet Take 25 mg by mouth 2 (two) times daily.   isosorbide mononitrate (IMDUR) 30 MG 24 hr tablet Take 30 mg by mouth daily.  potassium chloride (KLOR-CON) 10 MEQ tablet TAKE ONE TABLET BY MOUTH ONCE DAILY   Semaglutide (RYBELSUS) 7 MG TABS 1 tablet at least 30 minutes before first food, beverage or other oral medicine of the day   traMADol (ULTRAM) 50 MG tablet Take by mouth every 6 (six) hours as needed.   warfarin (COUMADIN) 5 MG tablet TAKE 1/2 TO 1 TABLET BY MOUTH ONCE DAILY AS DIRECTED by anticoagulation clinic     Allergies:   Ace inhibitors, Coreg [carvedilol], and Toprol xl [metoprolol tartrate]   Social History   Socioeconomic History   Marital status: Married    Spouse name: Not on file   Number of children: Not on file   Years of education: Not on file   Highest education level: Not on file  Occupational  History   Not on file  Tobacco Use   Smoking status: Never   Smokeless tobacco: Never  Vaping Use   Vaping Use: Never used  Substance and Sexual Activity   Alcohol use: No    Alcohol/week: 0.0 standard drinks of alcohol   Drug use: No   Sexual activity: Not on file  Other Topics Concern   Not on file  Social History Narrative   Not on file   Social Determinants of Health   Financial Resource Strain: Not on file  Food Insecurity: Not on file  Transportation Needs: Not on file  Physical Activity: Not on file  Stress: Not on file  Social Connections: Not on file     Family History: The patient's family history includes Hypertension in his mother; Irregular heart beat in his father; Stroke in his mother; Unexplained death in his father.  ROS:   Please see the history of present illness.     All other systems reviewed and are negative.  EKGs/Labs/Other Studies Reviewed:    The following studies were reviewed today:   Cardiac cath 12/20/14 There is severe left ventricular systolic dysfunction. Ejection fraction between 25-30% with global hypokinesis Moderately elevated pulmonary pressures-mean PA pressure 35 mmHg consistent with secondary pulmonary hypertension as a result of increased left ventricular end-diastolic pressure No angiographically significant coronary artery disease present.   Nonischemic cardiomyopathy. Left ventricular ejection fraction 25-30%. Left ventricular end-diastolic pressure 24 mmHg. Cardiac output 3.7 L/m with cardiac index of 1.8.   ECHO 2019:  - Left ventricle: The cavity size was severely dilated. There was    mild concentric hypertrophy. Systolic function was severely    reduced. The estimated ejection fraction was in the range of 25%    to 30%. Severe diffuse hypokinesis with regional variations.  - Aortic valve: There was moderate regurgitation directed    eccentrically in the LVOT and towards the mitral anterior    leaflet. Regurgitation  pressure half-time: 300 ms.  - Aorta: Aortic root dimension: 40 mm (ED).  - Aortic root: The aortic root was mildly dilated.  - Mitral valve: There was moderate regurgitation directed    eccentrically and posteriorly.  - Left atrium: The atrium was severely dilated.  - Right ventricle: The cavity size was moderately dilated. Wall    thickness was normal. Systolic function was moderately to    severely reduced.  - Tricuspid valve: There was mild regurgitation.  - Pulmonic valve: There was moderate regurgitation.  - Pulmonary arteries: PA peak pressure: 38 mm Hg (S).  - Pericardium, extracardiac: A trivial, free-flowing pericardial    effusion was identified posterior to the heart and along the    right atrial  free wall.    Recent Labs: 07/15/2021: ALT 27; BUN 16; Creatinine, Ser 1.81; Hemoglobin 13.3; Platelets 119; Potassium 4.2; Sodium 139; TSH 11.600  Recent Lipid Panel No results found for: "CHOL", "TRIG", "HDL", "CHOLHDL", "VLDL", "LDLCALC", "LDLDIRECT"   Risk Assessment/Calculations:              Physical Exam:    VS:  BP (!) 90/50 (BP Location: Left Arm, Patient Position: Sitting, Cuff Size: Normal)   Pulse 80   Ht 5\' 11"  (1.803 m)   Wt 175 lb (79.4 kg)   SpO2 96%   BMI 24.41 kg/m     Wt Readings from Last 3 Encounters:  10/28/21 175 lb (79.4 kg)  07/15/21 184 lb (83.5 kg)  04/29/20 180 lb (81.6 kg)     GEN:  Well nourished, well developed in no acute distress HEENT: Normal NECK: No JVD; No carotid bruits LYMPHATICS: No lymphadenopathy CARDIAC: RRR, 3/6 diastolic murmur, no rubs, gallops RESPIRATORY:  Clear to auscultation without rales, wheezing or rhonchi  ABDOMEN: Soft, non-tender, non-distended MUSCULOSKELETAL:  No edema; No deformity  SKIN: Warm and dry NEUROLOGIC:  Alert and oriented x 3 PSYCHIATRIC:  Normal affect   ASSESSMENT:    1. Chronic systolic heart failure ([place])   2. Nonrheumatic aortic valve insufficiency   3. Stage 3b chronic kidney  disease ([place])   4. Long term (current) use of anticoagulants    PLAN:    In order of problems listed above:  Chronic systolic heart failure -Prior ejection fraction 25 to 30%, has Medtronic CRT defibrillator.  Seems to be functioning properly upon check with Dr. Curt Bears and EP team.  Continue with current goal-directed medical therapy.  This is limited by kidney function.   Chronic kidney disease stage IIIb -Creatinine at last check 1.8.  Stable.  Previously 2.18.  No longer on spironolactone.  Last potassium was 4.2.  With his furosemide 40 mg as needed for fluid he does take low-level potassium supplementation.   Persistent atrial fibrillation/flutter -INR has been checked by her Coumadin clinic, last at goal at 2.3.  This was reviewed from 09/26/2021.  Excellent.  No signs of bleeding.  Continue with this high risk medication.  Monitoring reviewed.   Chronic anticoagulation -Continue with Coumadin.  Nasal spray if necessary.  As above.   Ventricular tachycardia -Previously had antitachycardia pacing or ATP on 02/13/2019.  Dr. Curt Bears reviewed.  He remains on amiodarone because of this.  Continuing with high risk medication management.  Recent check of TSH 11.6, hemoglobin 13.3 creatinine 1.8 potassium 4.2 magnesium 2.2 ALT 27   Aortic regurgitation with aortic root dilatation - Aortic root dilatation 40 mm minimally dilated.  We will continue to monitor.  I will check an echocardiogram since he has moderate aortic regurgitation.  Murmur heard on exam.          Medication Adjustments/Labs and Tests Ordered: Current medicines are reviewed at length with the patient today.  Concerns regarding medicines are outlined above.  Orders Placed This Encounter  Procedures   ECHOCARDIOGRAM COMPLETE   No orders of the defined types were placed in this encounter.   Patient Instructions  Medication Instructions:  Your physician recommends that you continue on your current medications as  directed. Please refer to the Current Medication list given to you today.  *If you need a refill on your cardiac medications before your next appointment, please call your pharmacy*  Testing/Procedures: Your physician has requested that you have an echocardiogram. Echocardiography is a  painless test that uses sound waves to create images of your heart. It provides your doctor with information about the size and shape of your heart and how well your heart's chambers and valves are working. This procedure takes approximately one hour. There are no restrictions for this procedure.  Follow-Up: At [facility], you and your health needs are our priority.  As part of our continuing mission to provide you with exceptional heart care, we have created designated Provider Care Teams.  These Care Teams include your primary Cardiologist (physician) and Advanced Practice Providers (APPs -  Physician Assistants and Nurse Practitioners) who all work together to provide you with the care you need, when you need it.  Your next appointment:   6 month(s)  The format for your next appointment:   In Person  Provider:   APP   Important Information About Sugar         Signed, [name], MD  10/28/2021 2:20 PM    [place]

## 2021-11-02 ENCOUNTER — Other Ambulatory Visit: Payer: Self-pay | Admitting: Cardiology

## 2021-11-02 DIAGNOSIS — I4819 Other persistent atrial fibrillation: Secondary | ICD-10-CM

## 2021-11-03 NOTE — Telephone Encounter (Signed)
Pt overdue; pending appointment on 8/4

## 2021-11-06 ENCOUNTER — Ambulatory Visit (HOSPITAL_COMMUNITY): Payer: Medicare Other | Attending: Cardiology

## 2021-11-06 DIAGNOSIS — I351 Nonrheumatic aortic (valve) insufficiency: Secondary | ICD-10-CM | POA: Insufficient documentation

## 2021-11-06 LAB — ECHOCARDIOGRAM COMPLETE
Area-P 1/2: 5.97 cm2
MV M vel: 4.98 m/s
MV Peak grad: 99 mmHg
P 1/2 time: 290 msec
S' Lateral: 5.7 cm

## 2021-11-07 ENCOUNTER — Ambulatory Visit (INDEPENDENT_AMBULATORY_CARE_PROVIDER_SITE_OTHER): Payer: Medicare Other | Admitting: *Deleted

## 2021-11-07 DIAGNOSIS — I483 Typical atrial flutter: Secondary | ICD-10-CM

## 2021-11-07 DIAGNOSIS — Z7901 Long term (current) use of anticoagulants: Secondary | ICD-10-CM | POA: Diagnosis not present

## 2021-11-07 LAB — POCT INR: INR: 1.6 — AB (ref 2.0–3.0)

## 2021-11-07 NOTE — Patient Instructions (Addendum)
Description   Today take 1 tablet then continue taking Warfarin 1/2 tablet daily except for 1 tablet on Tuesdays and Saturdays. Stay consistent with greens each week. Recheck INR in 3 weeks (normally 6 weeks). Coumadin Clinic [phone].

## 2021-11-11 DIAGNOSIS — N184 Chronic kidney disease, stage 4 (severe): Secondary | ICD-10-CM | POA: Diagnosis not present

## 2021-11-11 DIAGNOSIS — E1121 Type 2 diabetes mellitus with diabetic nephropathy: Secondary | ICD-10-CM | POA: Diagnosis not present

## 2021-11-11 DIAGNOSIS — E7800 Pure hypercholesterolemia, unspecified: Secondary | ICD-10-CM | POA: Diagnosis not present

## 2021-11-11 DIAGNOSIS — I129 Hypertensive chronic kidney disease with stage 1 through stage 4 chronic kidney disease, or unspecified chronic kidney disease: Secondary | ICD-10-CM | POA: Diagnosis not present

## 2021-11-13 ENCOUNTER — Other Ambulatory Visit: Payer: Self-pay | Admitting: Cardiology

## 2021-11-18 ENCOUNTER — Other Ambulatory Visit: Payer: Self-pay | Admitting: Cardiology

## 2021-11-18 DIAGNOSIS — D6859 Other primary thrombophilia: Secondary | ICD-10-CM | POA: Insufficient documentation

## 2021-11-18 DIAGNOSIS — N2581 Secondary hyperparathyroidism of renal origin: Secondary | ICD-10-CM | POA: Insufficient documentation

## 2021-11-18 DIAGNOSIS — E7800 Pure hypercholesterolemia, unspecified: Secondary | ICD-10-CM | POA: Insufficient documentation

## 2021-11-18 DIAGNOSIS — I129 Hypertensive chronic kidney disease with stage 1 through stage 4 chronic kidney disease, or unspecified chronic kidney disease: Secondary | ICD-10-CM | POA: Insufficient documentation

## 2021-11-18 DIAGNOSIS — E1121 Type 2 diabetes mellitus with diabetic nephropathy: Secondary | ICD-10-CM | POA: Insufficient documentation

## 2021-11-18 NOTE — Telephone Encounter (Signed)
Per [facility] patient has not refilled potassium since 05/2021. They just received refill request and sent to our office. Per chart last refill was sent 06/25/21 #15, as patient needed f/u OV. Pt seen several times since then, but no refill requests were received.  Labs 07/15/21 show normal Potassium, unsure if patient was actually taking as directed as pharmacy had not filled in 64mos at that time.  Please advise on refill request, thank you!

## 2021-11-28 ENCOUNTER — Ambulatory Visit (INDEPENDENT_AMBULATORY_CARE_PROVIDER_SITE_OTHER): Payer: Medicare Other | Admitting: *Deleted

## 2021-11-28 DIAGNOSIS — Z7901 Long term (current) use of anticoagulants: Secondary | ICD-10-CM

## 2021-11-28 DIAGNOSIS — I483 Typical atrial flutter: Secondary | ICD-10-CM | POA: Diagnosis not present

## 2021-11-28 LAB — POCT INR: INR: 2.2 (ref 2.0–3.0)

## 2021-11-28 NOTE — Patient Instructions (Signed)
Description   Continue taking Warfarin 1/2 tablet daily except for 1 tablet on Tuesdays and Saturdays. Stay consistent with greens each week. Recheck INR in 4 weeks (normally 6 weeks). Coumadin Clinic [phone].

## 2021-12-03 ENCOUNTER — Other Ambulatory Visit: Payer: Self-pay | Admitting: Cardiology

## 2021-12-03 DIAGNOSIS — I4819 Other persistent atrial fibrillation: Secondary | ICD-10-CM

## 2021-12-03 NOTE — Telephone Encounter (Signed)
Refill request for warfarin:  Last INR was 2.2 on 11/28/21 Next INR due on 12/26/21 LOV was 10/28/21  Refill approved.

## 2021-12-04 ENCOUNTER — Telehealth: Payer: Self-pay | Admitting: Cardiology

## 2021-12-04 MED ORDER — POTASSIUM CHLORIDE ER 10 MEQ PO TBCR: 10.0000 meq | EXTENDED_RELEASE_TABLET | Freq: Every day | ORAL | 3 refills | Status: DC

## 2021-12-04 NOTE — Telephone Encounter (Signed)
Pt's medication was sent to pt's pharmacy as requested. Confirmation received.  °

## 2021-12-04 NOTE — Telephone Encounter (Signed)
*  STAT* If patient is at the pharmacy, call can be transferred to refill team.   1. Which medications need to be refilled? (please list name of each medication and dose if known)  potassium chloride (KLOR-CON) 10 MEQ tablet  2. Which pharmacy/location (including street and city if local pharmacy) is medication to be sent to? [facility] - [place], [place] - [place] [address]  3. Do they need a 30 day or 90 day supply? [place]

## 2021-12-25 NOTE — Telephone Encounter (Signed)
Refill already sent to pharmacy.

## 2021-12-26 ENCOUNTER — Ambulatory Visit: Payer: Medicare Other | Attending: Internal Medicine | Admitting: *Deleted

## 2021-12-26 DIAGNOSIS — Z7901 Long term (current) use of anticoagulants: Secondary | ICD-10-CM | POA: Diagnosis not present

## 2021-12-26 DIAGNOSIS — I483 Typical atrial flutter: Secondary | ICD-10-CM | POA: Insufficient documentation

## 2021-12-26 DIAGNOSIS — Z5181 Encounter for therapeutic drug level monitoring: Secondary | ICD-10-CM | POA: Insufficient documentation

## 2021-12-26 LAB — POCT INR: INR: 2 (ref 2.0–3.0)

## 2021-12-26 NOTE — Patient Instructions (Addendum)
Description   Take 1 tablet of warfarin today and then continue taking Warfarin 1/2 tablet daily except for 1 tablet on Tuesdays and Saturdays. Stay consistent with greens each week. Recheck INR in 6 weeks Coumadin Clinic [phone].

## 2021-12-29 DIAGNOSIS — I129 Hypertensive chronic kidney disease with stage 1 through stage 4 chronic kidney disease, or unspecified chronic kidney disease: Secondary | ICD-10-CM | POA: Diagnosis not present

## 2021-12-29 DIAGNOSIS — E7800 Pure hypercholesterolemia, unspecified: Secondary | ICD-10-CM | POA: Diagnosis not present

## 2021-12-29 DIAGNOSIS — Z23 Encounter for immunization: Secondary | ICD-10-CM | POA: Diagnosis not present

## 2021-12-29 DIAGNOSIS — Z79899 Other long term (current) drug therapy: Secondary | ICD-10-CM | POA: Diagnosis not present

## 2021-12-29 DIAGNOSIS — I351 Nonrheumatic aortic (valve) insufficiency: Secondary | ICD-10-CM | POA: Diagnosis not present

## 2021-12-29 DIAGNOSIS — Z0000 Encounter for general adult medical examination without abnormal findings: Secondary | ICD-10-CM | POA: Diagnosis not present

## 2021-12-29 DIAGNOSIS — I483 Typical atrial flutter: Secondary | ICD-10-CM | POA: Diagnosis not present

## 2021-12-29 DIAGNOSIS — I5020 Unspecified systolic (congestive) heart failure: Secondary | ICD-10-CM | POA: Diagnosis not present

## 2021-12-29 DIAGNOSIS — N184 Chronic kidney disease, stage 4 (severe): Secondary | ICD-10-CM | POA: Diagnosis not present

## 2021-12-29 DIAGNOSIS — E1121 Type 2 diabetes mellitus with diabetic nephropathy: Secondary | ICD-10-CM | POA: Diagnosis not present

## 2021-12-29 DIAGNOSIS — D696 Thrombocytopenia, unspecified: Secondary | ICD-10-CM | POA: Diagnosis not present

## 2021-12-29 DIAGNOSIS — Z1331 Encounter for screening for depression: Secondary | ICD-10-CM | POA: Diagnosis not present

## 2022-01-12 DIAGNOSIS — N189 Chronic kidney disease, unspecified: Secondary | ICD-10-CM | POA: Diagnosis not present

## 2022-01-12 DIAGNOSIS — D631 Anemia in chronic kidney disease: Secondary | ICD-10-CM | POA: Diagnosis not present

## 2022-01-12 DIAGNOSIS — I5020 Unspecified systolic (congestive) heart failure: Secondary | ICD-10-CM | POA: Diagnosis not present

## 2022-01-12 DIAGNOSIS — I129 Hypertensive chronic kidney disease with stage 1 through stage 4 chronic kidney disease, or unspecified chronic kidney disease: Secondary | ICD-10-CM | POA: Diagnosis not present

## 2022-01-12 DIAGNOSIS — N1832 Chronic kidney disease, stage 3b: Secondary | ICD-10-CM | POA: Diagnosis not present

## 2022-01-12 DIAGNOSIS — E7800 Pure hypercholesterolemia, unspecified: Secondary | ICD-10-CM | POA: Diagnosis not present

## 2022-01-12 DIAGNOSIS — N2581 Secondary hyperparathyroidism of renal origin: Secondary | ICD-10-CM | POA: Diagnosis not present

## 2022-01-12 DIAGNOSIS — E1122 Type 2 diabetes mellitus with diabetic chronic kidney disease: Secondary | ICD-10-CM | POA: Diagnosis not present

## 2022-01-30 IMAGING — US US RENAL
1 series · 14 of 25 positions shown · non-contrast
Comparison: 01/26/2006

CLINICAL DATA: Chronic renal disease

EXAM:
RENAL / URINARY TRACT ULTRASOUND COMPLETE

[Series 1: us renal · 0.23mm/px · 14 of 81 slices shown]
[im 1/81]
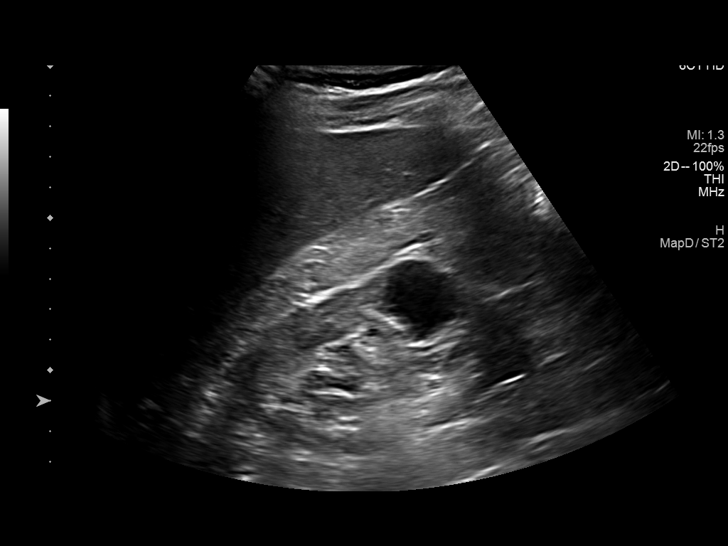
[im 7/81]
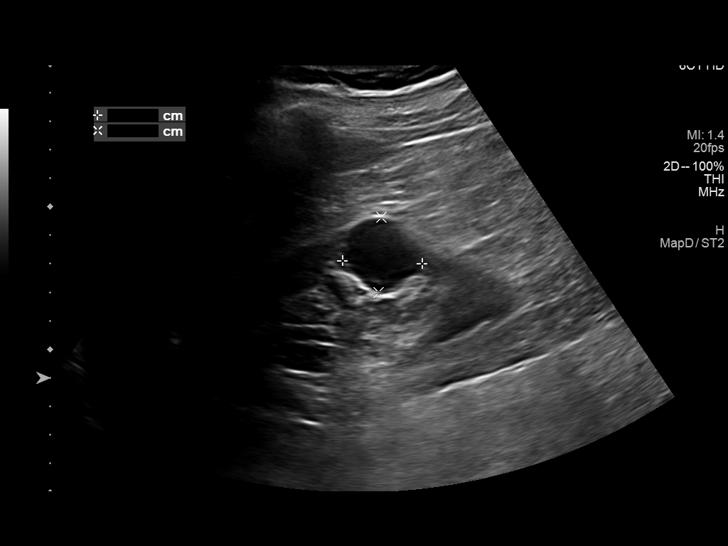
[im 14/81]
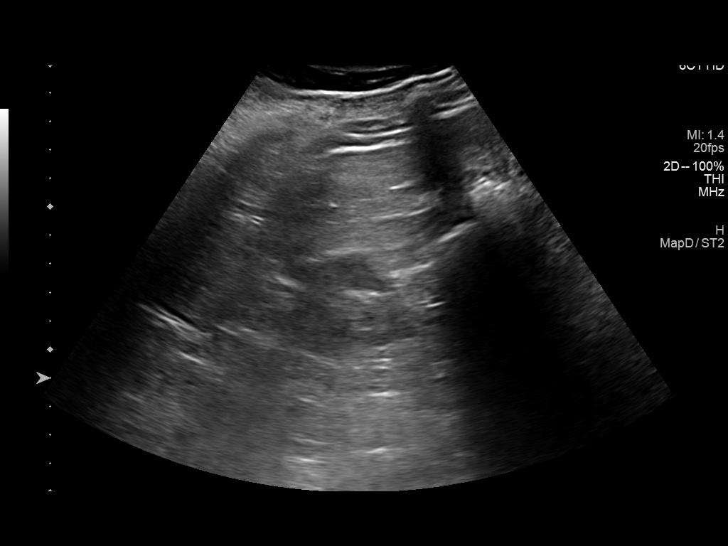
[im 21/81]
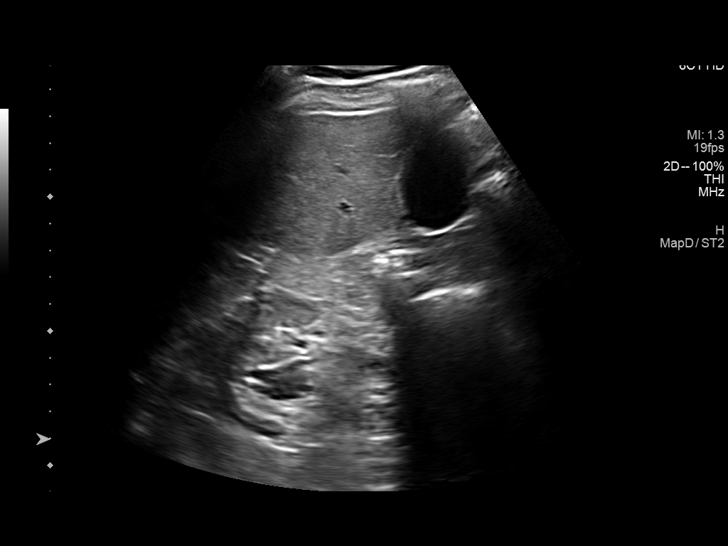
[im 27/81]
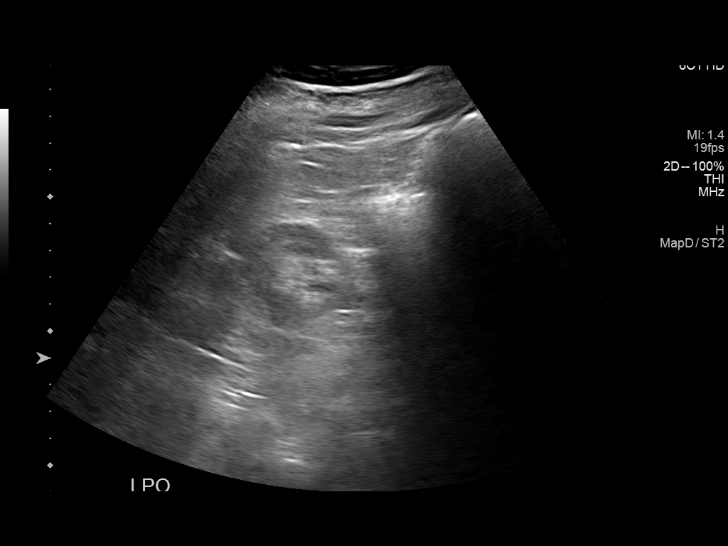
[im 31/81]
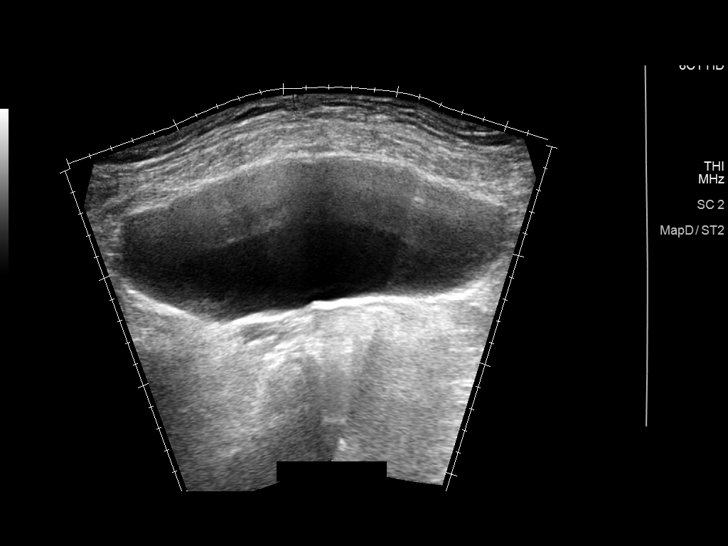
[im 37/81]
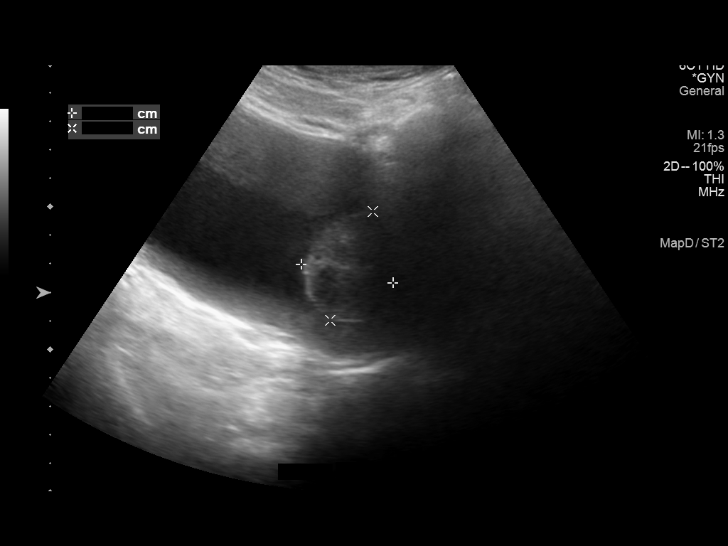
[im 44/81]
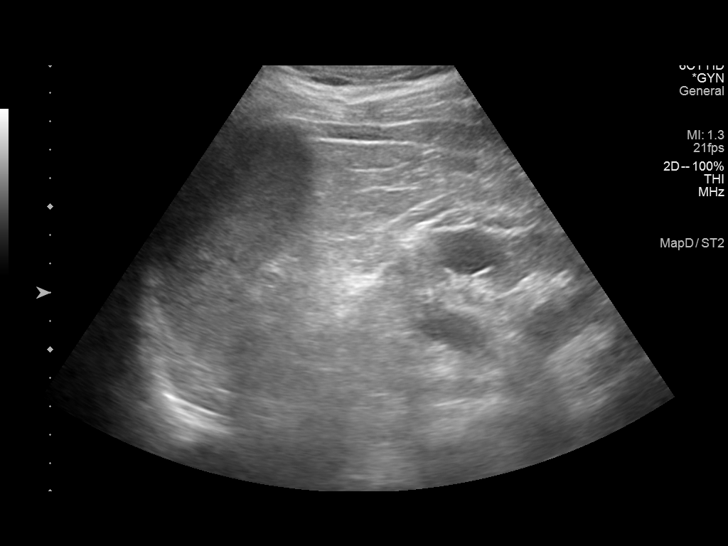
[im 51/81]
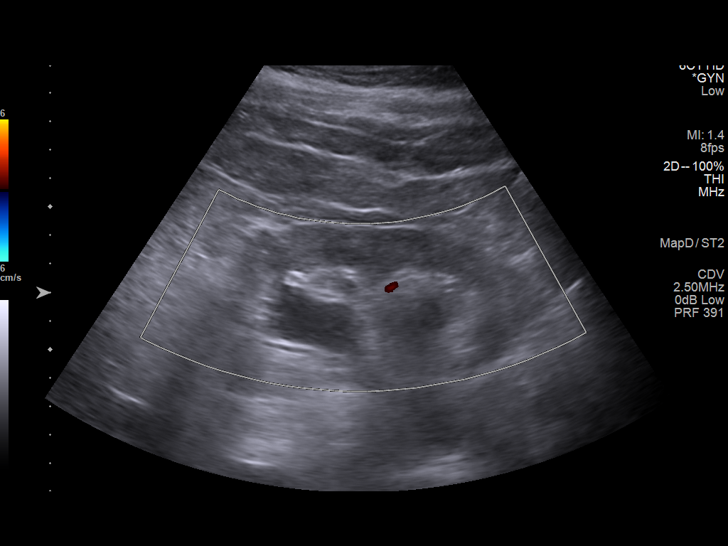
[im 54/81]
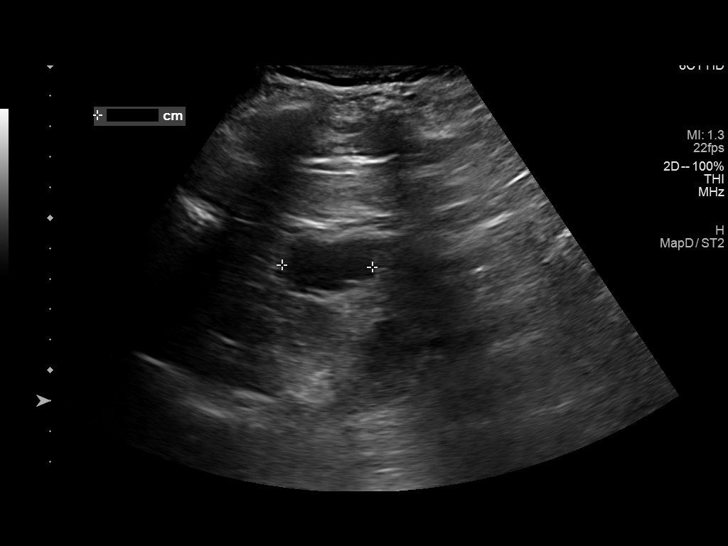
[im 61/81]
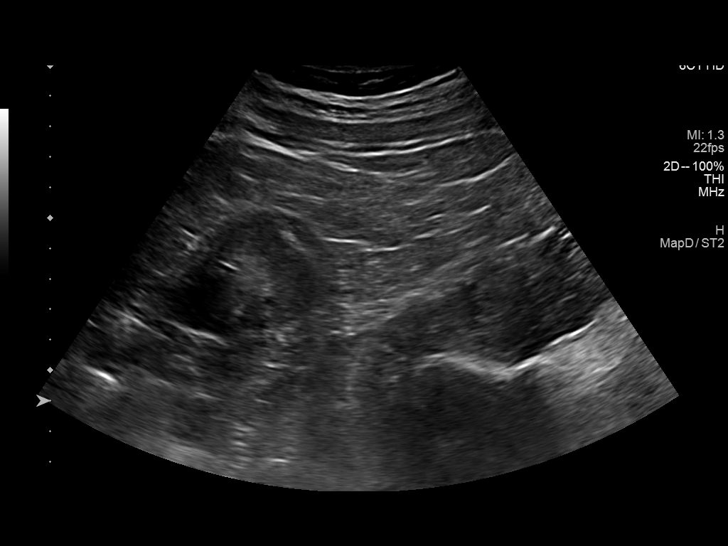
[im 67/81]
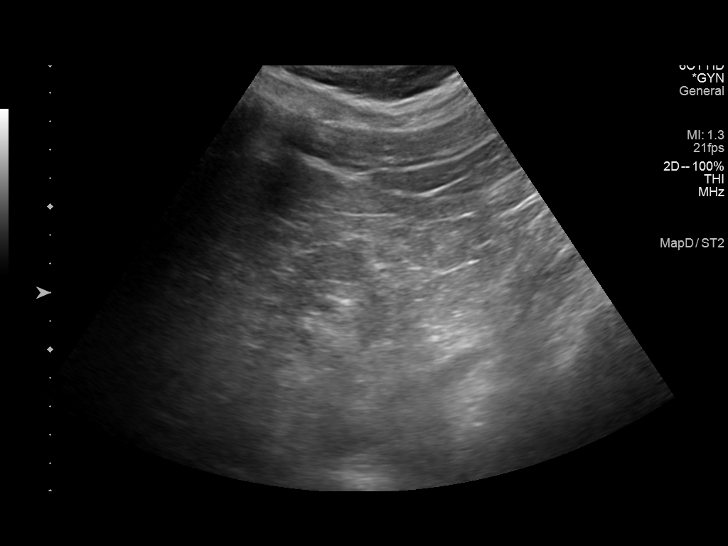
[im 74/81]
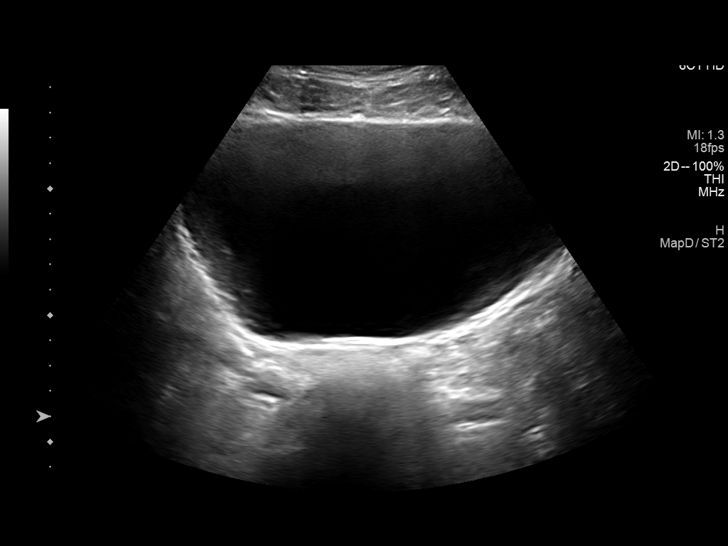
[im 81/81]
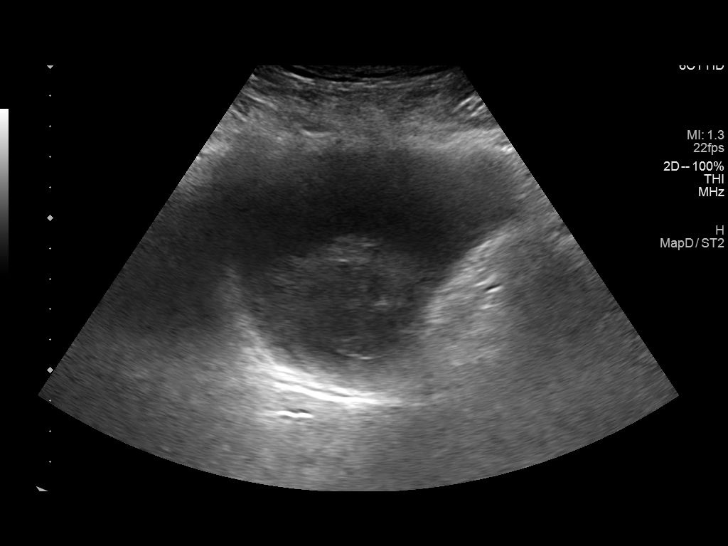

[14 of 25 positions shown; findings below may reference images not displayed]

FINDINGS: Right Kidney:

Renal measurements: 11.1 x 5.3 x 5.5 cm. = volume: 169 mL. 2.8 cm
cyst is noted in the midportion of the right kidney. No mass lesion
or hydronephrosis is noted.

Left Kidney:

Renal measurements: 12.8 x 5.7 x 5.4 cm. = volume: 207 mL. No mass
lesion or hydronephrosis is noted. Several cysts are noted within
the left kidney. Largest of these measures 3 cm in the midportion of
the left kidney.

Bladder:

Bladder is well distended. There is a filling defect along the
inferior aspect of the bladder. Although this may represent
hypertrophied prostate the possibility of bladder mass deserves
consideration. Direct visualization may be helpful.

Other:

None.
IMPRESSION: Bilateral renal cysts which appears stable from the prior exam.

Filling defect within the bladder inferiorly. Possibility of an
intraluminal bladder mass could not be totally excluded. Direct
visualization would be helpful.

## 2022-02-03 DIAGNOSIS — I129 Hypertensive chronic kidney disease with stage 1 through stage 4 chronic kidney disease, or unspecified chronic kidney disease: Secondary | ICD-10-CM | POA: Diagnosis not present

## 2022-02-03 DIAGNOSIS — E7800 Pure hypercholesterolemia, unspecified: Secondary | ICD-10-CM | POA: Diagnosis not present

## 2022-02-03 DIAGNOSIS — E1121 Type 2 diabetes mellitus with diabetic nephropathy: Secondary | ICD-10-CM | POA: Diagnosis not present

## 2022-02-03 DIAGNOSIS — N184 Chronic kidney disease, stage 4 (severe): Secondary | ICD-10-CM | POA: Diagnosis not present

## 2022-02-06 ENCOUNTER — Ambulatory Visit: Payer: Medicare Other | Attending: Internal Medicine

## 2022-02-06 DIAGNOSIS — Z7901 Long term (current) use of anticoagulants: Secondary | ICD-10-CM | POA: Diagnosis not present

## 2022-02-06 DIAGNOSIS — I483 Typical atrial flutter: Secondary | ICD-10-CM | POA: Diagnosis not present

## 2022-02-06 LAB — POCT INR: INR: 3.7 — AB (ref 2.0–3.0)

## 2022-02-06 NOTE — Patient Instructions (Signed)
HOLD TONIGHT ONLY and then continue taking Warfarin 1/2 tablet daily except for 1 tablet on Tuesdays and Saturdays. Stay consistent with greens each week. Recheck INR in 4 weeks Coumadin Clinic [phone]. EAT GREENS TONIGHT.

## 2022-03-06 ENCOUNTER — Telehealth: Payer: Self-pay | Admitting: Physician Assistant

## 2022-03-06 ENCOUNTER — Ambulatory Visit: Payer: Medicare Other | Attending: Cardiovascular Disease | Admitting: Pharmacist

## 2022-03-06 DIAGNOSIS — I483 Typical atrial flutter: Secondary | ICD-10-CM | POA: Diagnosis not present

## 2022-03-06 DIAGNOSIS — Z7901 Long term (current) use of anticoagulants: Secondary | ICD-10-CM | POA: Diagnosis not present

## 2022-03-06 LAB — POCT INR
INR: 2.5 (ref 2.0–3.0)
POC INR: 2.5

## 2022-03-06 NOTE — Patient Instructions (Signed)
Description   Continue taking Warfarin 1/2 tablet daily except for 1 tablet on Tuesdays and Saturdays. Stay consistent with greens each week. Recheck INR in 4 weeks Coumadin Clinic [phone].

## 2022-03-17 DIAGNOSIS — N1832 Chronic kidney disease, stage 3b: Secondary | ICD-10-CM | POA: Diagnosis not present

## 2022-03-31 ENCOUNTER — Ambulatory Visit (INDEPENDENT_AMBULATORY_CARE_PROVIDER_SITE_OTHER): Payer: Medicare Other

## 2022-03-31 DIAGNOSIS — I428 Other cardiomyopathies: Secondary | ICD-10-CM | POA: Diagnosis not present

## 2022-03-31 LAB — CUP PACEART REMOTE DEVICE CHECK
Battery Remaining Longevity: 10 mo
Battery Voltage: 2.86 V
Brady Statistic AP VP Percent: 11.18 %
Brady Statistic AP VS Percent: 0.04 %
Brady Statistic AS VP Percent: 85.03 %
Brady Statistic AS VS Percent: 3.75 %
Brady Statistic RA Percent Paced: 10.58 %
Brady Statistic RV Percent Paced: 96.13 %
Date Time Interrogation Session: 20231226064911
HighPow Impedance: 58 Ohm
Implantable Lead Connection Status: 753985
Implantable Lead Connection Status: 753985
Implantable Lead Connection Status: 753985
Implantable Lead Implant Date: 20191023
Implantable Lead Implant Date: 20191023
Implantable Lead Implant Date: 20191023
Implantable Lead Location: 753858
Implantable Lead Location: 753859
Implantable Lead Location: 753860
Implantable Lead Model: 4598
Implantable Lead Model: 5076
Implantable Lead Model: 6935
Implantable Pulse Generator Implant Date: 20191023
Lead Channel Impedance Value: 156.606
Lead Channel Impedance Value: 156.606
Lead Channel Impedance Value: 161.5 Ohm
Lead Channel Impedance Value: 161.5 Ohm
Lead Channel Impedance Value: 161.5 Ohm
Lead Channel Impedance Value: 266 Ohm
Lead Channel Impedance Value: 304 Ohm
Lead Channel Impedance Value: 323 Ohm
Lead Channel Impedance Value: 323 Ohm
Lead Channel Impedance Value: 323 Ohm
Lead Channel Impedance Value: 342 Ohm
Lead Channel Impedance Value: 380 Ohm
Lead Channel Impedance Value: 399 Ohm
Lead Channel Impedance Value: 513 Ohm
Lead Channel Impedance Value: 532 Ohm
Lead Channel Impedance Value: 532 Ohm
Lead Channel Impedance Value: 532 Ohm
Lead Channel Impedance Value: 532 Ohm
Lead Channel Pacing Threshold Amplitude: 0.625 V
Lead Channel Pacing Threshold Amplitude: 1.125 V
Lead Channel Pacing Threshold Amplitude: 1.375 V
Lead Channel Pacing Threshold Pulse Width: 0.4 ms
Lead Channel Pacing Threshold Pulse Width: 0.4 ms
Lead Channel Pacing Threshold Pulse Width: 0.8 ms
Lead Channel Sensing Intrinsic Amplitude: 2.375 mV
Lead Channel Sensing Intrinsic Amplitude: 2.375 mV
Lead Channel Sensing Intrinsic Amplitude: 8.125 mV
Lead Channel Sensing Intrinsic Amplitude: 8.125 mV
Lead Channel Setting Pacing Amplitude: 1.5 V
Lead Channel Setting Pacing Amplitude: 2.25 V
Lead Channel Setting Pacing Amplitude: 3.25 V
Lead Channel Setting Pacing Pulse Width: 0.4 ms
Lead Channel Setting Pacing Pulse Width: 0.8 ms
Lead Channel Setting Sensing Sensitivity: 0.3 mV
Zone Setting Status: 755011
Zone Setting Status: 755011

## 2022-04-09 ENCOUNTER — Ambulatory Visit: Payer: Medicare Other | Attending: Internal Medicine | Admitting: *Deleted

## 2022-04-09 DIAGNOSIS — Z7901 Long term (current) use of anticoagulants: Secondary | ICD-10-CM | POA: Diagnosis not present

## 2022-04-09 DIAGNOSIS — I483 Typical atrial flutter: Secondary | ICD-10-CM | POA: Diagnosis not present

## 2022-04-09 LAB — POCT INR: INR: 2.9 (ref 2.0–3.0)

## 2022-04-09 NOTE — Patient Instructions (Signed)
Description   Continue taking Warfarin 1/2 tablet daily except for 1 tablet on Tuesdays and Saturdays. Stay consistent with greens each week. Recheck INR in 5 weeks Coumadin Clinic [phone].

## 2022-04-14 ENCOUNTER — Telehealth: Payer: Self-pay | Admitting: Cardiology

## 2022-04-14 NOTE — Telephone Encounter (Signed)
Patient states he was returning call. Please advise ?

## 2022-04-16 NOTE — Telephone Encounter (Signed)
See device transmission for documentation on this.

## 2022-04-22 NOTE — Progress Notes (Signed)
Remote ICD transmission.   

## 2022-04-23 ENCOUNTER — Encounter: Payer: Self-pay | Admitting: Cardiology

## 2022-04-23 ENCOUNTER — Ambulatory Visit: Payer: Medicare Other | Attending: Cardiology | Admitting: Cardiology

## 2022-04-23 ENCOUNTER — Encounter: Payer: Self-pay | Admitting: *Deleted

## 2022-04-23 VITALS — BP 120/66 | HR 84 | Ht 71.0 in | Wt 181.0 lb

## 2022-04-23 DIAGNOSIS — Z01812 Encounter for preprocedural laboratory examination: Secondary | ICD-10-CM | POA: Diagnosis not present

## 2022-04-23 DIAGNOSIS — I493 Ventricular premature depolarization: Secondary | ICD-10-CM | POA: Diagnosis not present

## 2022-04-23 DIAGNOSIS — Z79899 Other long term (current) drug therapy: Secondary | ICD-10-CM | POA: Diagnosis not present

## 2022-04-23 DIAGNOSIS — I472 Ventricular tachycardia, unspecified: Secondary | ICD-10-CM | POA: Diagnosis not present

## 2022-04-23 DIAGNOSIS — I483 Typical atrial flutter: Secondary | ICD-10-CM

## 2022-04-23 DIAGNOSIS — I4819 Other persistent atrial fibrillation: Secondary | ICD-10-CM | POA: Diagnosis not present

## 2022-04-23 DIAGNOSIS — D6869 Other thrombophilia: Secondary | ICD-10-CM | POA: Diagnosis not present

## 2022-04-23 DIAGNOSIS — I5022 Chronic systolic (congestive) heart failure: Secondary | ICD-10-CM | POA: Diagnosis not present

## 2022-04-23 NOTE — Progress Notes (Signed)
Electrophysiology Office Note   Date:  04/23/2022   ID:  Carlos Gibson, DOB 07/17/1940, MRN [id number]  PCP:  [name], MD  Cardiologist:  Marlou Porch Primary Electrophysiologist:  Constance Haw, MD    No chief complaint on file.    History of Present Illness: Carlos Gibson is a 82 y.o. male who presents today for electrophysiology evaluation.     She is significant for chronic systolic heart failure due to nonischemic cardiomyopathy, hypertension, PVCs, aortic root dilation, trifascicular block, diabetes, Gilbert's syndrome, hyperlipidemia, CKD stage III.  He is post Medtronic CRT-D implanted 01/26/2018.  Today, denies symptoms of palpitations, chest pain,  orthopnea, PND, lower extremity edema, claudication, dizziness, presyncope, syncope, bleeding, or neurologic sequela. The patient is tolerating medications without difficulties.  At rest he feels well.  When he is exerting himself, he does feel short of breath and fatigue.  He has been feeling this way for the last few months.  He has no chest pain.  He continues to work doing concrete work.  He would like to get back into normal rhythm.   Past Medical History:  Diagnosis Date   Aortic regurgitation    a. mild AI by echo 01/2016.   Aortic root dilation (HCC)    Aortic valve regurgitation    Bifascicular block    Chronic systolic CHF (congestive heart failure) (HCC)    CKD (chronic kidney disease), stage III ([place]) 01/01/2016   Diabetes (HCC)    Ejection fraction < 50%    Frequent PVCs    Gilbert's syndrome    Hyperlipidemia    Mild diastolic dysfunction    Mitral regurgitation    a. mod by echo 10/207.   NICM (nonischemic cardiomyopathy) ([place])    Pericardial effusion    a. small by echo 01/2016.   Pulmonary hypertension ([place])    Right BBB/left post fasc block    Thrombocytopenia [facility])    Past Surgical History:  Procedure Laterality Date   BIV ICD INSERTION CRT-D N/A 01/26/2018   Procedure: BIV ICD INSERTION  CRT-D;  Surgeon: Constance Haw, MD;  Location: [place] [facility];  Service: Cardiovascular;  Laterality: N/A;   CARDIAC CATHETERIZATION N/A 12/20/2014   Procedure: Right/Left Heart Cath and Coronary Angiography;  Surgeon: Jerline Pain, MD;  Location: [facility] [facility];  Service: Cardiovascular;  Laterality: N/A;   CARDIOVERSION N/A 10/24/2018   Procedure: CARDIOVERSION;  Surgeon: Thayer Headings, MD;  Location: [facility] ENDOSCOPY;  Service: Cardiovascular;  Laterality: N/A;   HERNIA REPAIR  06/2017     Current Outpatient Medications  Medication Sig Dispense Refill   acetaminophen (TYLENOL) 650 MG CR tablet Take 650 mg by mouth every 8 (eight) hours as needed for pain.     alfuzosin (UROXATRAL) 10 MG 24 hr tablet Take 10 mg by mouth at bedtime.   3   amiodarone (PACERONE) 200 MG tablet Take 1 tablet (200 mg total) by mouth daily. 90 tablet 3   atorvastatin (LIPITOR) 10 MG tablet Take 10 mg by mouth at bedtime.     bisoprolol (ZEBETA) 5 MG tablet Take 1 tablet (5 mg total) by mouth daily. 90 tablet 3   Cholecalciferol (VITAMIN D3) 25 MCG (1000 UT) CAPS Take 1,000 Units by mouth every other day.      finasteride (PROSCAR) 5 MG tablet Take 5 mg by mouth daily.   2   furosemide (LASIX) 40 MG tablet Take 40 mg by mouth as needed for fluid or edema.  glimepiride (AMARYL) 2 MG tablet Take 2 mg by mouth at bedtime.     hydrALAZINE (APRESOLINE) 25 MG tablet Take 25 mg by mouth 2 (two) times daily.     isosorbide mononitrate (IMDUR) 30 MG 24 hr tablet Take 30 mg by mouth daily.     potassium chloride (KLOR-CON) 10 MEQ tablet Take 1 tablet (10 mEq total) by mouth daily. 90 tablet 3   Semaglutide (RYBELSUS) 7 MG TABS 1 tablet at least 30 minutes before first food, beverage or other oral medicine of the day     traMADol (ULTRAM) 50 MG tablet Take by mouth every 6 (six) hours as needed.     warfarin (COUMADIN) 5 MG tablet TAKE 1/2 TO 1 TABLET BY MOUTH ONCE DAILY AS DIRECTED by anticoagulation clinic  30 tablet 5   No current facility-administered medications for this visit.    Allergies:   Metformin hcl, Sulfa antibiotics, Ace inhibitors, Coreg [carvedilol], and Toprol xl [metoprolol tartrate]   Social History:  The patient  reports that he has never smoked. He has never used smokeless tobacco. He reports that he does not drink alcohol and does not use drugs.   Family History:  The patient's family history includes Hypertension in his mother; Irregular heart beat in his father; Stroke in his mother; Unexplained death in his father.   ROS:  Please see the history of present illness.   Otherwise, review of systems is positive for none.   All other systems are reviewed and negative.   PHYSICAL EXAM: VS:  BP 120/66   Pulse 84   Ht 5\' 11"  (1.803 m)   Wt 181 lb (82.1 kg)   SpO2 98%   BMI 25.24 kg/m  , BMI Body mass index is 25.24 kg/m. GEN: Well nourished, well developed, in no acute distress  HEENT: normal  Neck: no JVD, carotid bruits, or masses Cardiac: RRR; no murmurs, rubs, or gallops,no edema  Respiratory:  clear to auscultation bilaterally, normal work of breathing GI: soft, nontender, nondistended, + BS MS: no deformity or atrophy  Skin: warm and dry, device site well healed Neuro:  Strength and sensation are intact Psych: euthymic mood, full affect  EKG:  EKG is ordered today. Personal review of the ekg ordered shows A flutter, V pace  Personal review of the device interrogation today. Results in [place]: 07/15/2021: ALT 27; BUN 16; Creatinine, Ser 1.81; Hemoglobin 13.3; Platelets 119; Potassium 4.2; Sodium 139; TSH 11.600    Lipid Panel  No results found for: "CHOL", "TRIG", "HDL", "CHOLHDL", "VLDL", "LDLCALC", "LDLDIRECT"   Wt Readings from Last 3 Encounters:  04/23/22 181 lb (82.1 kg)  10/28/21 175 lb (79.4 kg)  07/15/21 184 lb (83.5 kg)      Other studies Reviewed: Additional studies/ records that were reviewed today include: TTE 11/06/21   Review of the above records today demonstrates:   1. Akinesis of the inferior and inferolateral walls and hypokinesis of  remaining walls; overall severe LV dysfunction.   2. Left ventricular ejection fraction, by estimation, is 25 to 30%. The  left ventricle has severely decreased function. The left ventricle  demonstrates regional wall motion abnormalities (see scoring  diagram/findings for description). The left  ventricular internal cavity size was severely dilated. Left ventricular  diastolic parameters are consistent with Grade II diastolic dysfunction  (pseudonormalization). Elevated left atrial pressure.   3. Right ventricular systolic function is mildly reduced. The right  ventricular size is mildly enlarged.   4.  Left atrial size was severely dilated.   5. Right atrial size was moderately dilated.   6. A small pericardial effusion is present.   7. The mitral valve is normal in structure. Severe mitral valve  regurgitation. No evidence of mitral stenosis.   8. Tricuspid valve regurgitation is moderate.   9. The aortic valve is tricuspid. Aortic valve regurgitation is moderate  to severe. No aortic stenosis is present.  10. Aortic dilatation noted. There is borderline dilatation of the aortic  root, measuring 39 mm.  11. The inferior vena cava is normal in size with greater than 50%  respiratory variability, suggesting right atrial pressure of 3 mmHg.   ASSESSMENT AND PLAN:  1.  Chronic systolic heart failure: Due to nonischemic cardiomyopathy.  Optimal medical therapy per primary cardiology.  Status post Medtronic CRT-D implanted 01/26/2018.  Device function appropriately.  No changes at this time.  2.  PVCs: Has decreased BiV pacing.  Currently on amiodarone 200 mg daily.  3.  Hypertension: Currently well-controlled  4.  Persistent atrial fibrillation/flutter: Currently on warfarin and amiodarone.  CHA2DS2-VASc of 4.  He is unfortunately in atrial fibrillation/flutter  today.  Feeling short of breath and fatigue.  Due to that, we Zaeem Kandel plan for cardioversion.  5.  Ventricular tachycardia: ATP 02/13/2019.  Remains on amiodarone.  Has had no further episodes.  6.  High risk medication monitoring: Currently on amiodarone for PVCs and atrial fibrillation as above.  EKG and labs without major abnormality.  Marielle Mantione check amiodarone monitoring labs today.   Current medicines are reviewed at length with the patient today.   The patient does not have concerns regarding his medicines.  The following changes were made today: None  Labs/ tests ordered today include:  Orders Placed This Encounter  Procedures   Protime-INR   TSH   Hepatic function panel   EKG 12-Lead    Disposition:   FU 3 months  Signed, Mister Krahenbuhl Meredith Leeds, MD  04/23/2022 3:56 PM     [facility] [address] [place] [address] [phone] (office) [phone] (fax)

## 2022-04-23 NOTE — Patient Instructions (Addendum)
Medication Instructions:  Your physician recommends that you continue on your current medications as directed. Please refer to the Current Medication list given to you today.  *If you need a refill on your cardiac medications before your next appointment, please call your pharmacy*   Lab Work: Today:  INR, TSH and LFTs.  If you have labs (blood work) drawn today and your tests are completely normal, you will receive your results only by: [place] (if you have MyChart) OR A paper copy in the mail If you have any lab test that is abnormal or we need to change your treatment, we will call you to review the results.   Testing/Procedures: Your physician has recommended that you have a Cardioversion (DCCV). Electrical Cardioversion uses a jolt of electricity to your heart either through paddles or wired patches attached to your chest. This is a controlled, usually prescheduled, procedure. Defibrillation is done under light anesthesia in the hospital, and you usually go home the day of the procedure. This is done to get your heart back into a normal rhythm. You are not awake for the procedure. Please see the instruction sheet given to you today.   Follow-Up: At [facility], you and your health needs are our priority.  As part of our continuing mission to provide you with exceptional heart care, we have created designated Provider Care Teams.  These Care Teams include your primary Cardiologist (physician) and Advanced Practice Providers (APPs -  Physician Assistants and Nurse Practitioners) who all work together to provide you with the care you need, when you need it.  We recommend signing up for the patient portal called "MyChart".  Sign up information is provided on this After Visit Summary.  MyChart is used to connect with patients for Virtual Visits (Telemedicine).  Patients are able to view lab/test results, encounter notes, upcoming appointments, etc.  Non-urgent messages can be sent to  your provider as well.   To learn more about what you can do with MyChart, go to [email or web address].    Remote monitoring is used to monitor your Pacemaker or ICD from home. This monitoring reduces the number of office visits required to check your device to one time per year. It allows [place] to keep an eye on the functioning of your device to ensure it is working properly. You are scheduled for a device check from home on 06/01/2022. You may send your transmission at any time that day. If you have a wireless device, the transmission will be sent automatically. After your physician reviews your transmission, you will receive a postcard with your next transmission date.  Your next appointment:   2 week(s) after your cardioversion on 05/13/2022  The format for your next appointment:   In Person  Provider:   You will follow up in the [facility] located at [facility]. Your provider will be: Roderic Palau, NP or Clint R. Fenton, PA-C    Thank you for choosing [facility]!!   Trinidad Curet, RN [phone]    Other Instructions

## 2022-04-23 NOTE — H&P (View-Only) (Signed)
Electrophysiology Office Note   Date:  04/23/2022   ID:  KAIMEN PEINE, DOB [birth year], MRN [id number]  PCP:  [name], MD  Cardiologist:  Marlou Porch Primary Electrophysiologist:  Constance Haw, MD    No chief complaint on file.    History of Present Illness: Carlos Gibson is a 82 y.o. male who presents today for electrophysiology evaluation.     She is significant for chronic systolic heart failure due to nonischemic cardiomyopathy, hypertension, PVCs, aortic root dilation, trifascicular block, diabetes, Gilbert's syndrome, hyperlipidemia, CKD stage III.  He is post Medtronic CRT-D implanted 01/26/2018.  Today, denies symptoms of palpitations, chest pain,  orthopnea, PND, lower extremity edema, claudication, dizziness, presyncope, syncope, bleeding, or neurologic sequela. The patient is tolerating medications without difficulties.  At rest he feels well.  When he is exerting himself, he does feel short of breath and fatigue.  He has been feeling this way for the last few months.  He has no chest pain.  He continues to work doing concrete work.  He would like to get back into normal rhythm.   Past Medical History:  Diagnosis Date   Aortic regurgitation    a. mild AI by echo 01/2016.   Aortic root dilation (HCC)    Aortic valve regurgitation    Bifascicular block    Chronic systolic CHF (congestive heart failure) (HCC)    CKD (chronic kidney disease), stage III ([place]) 01/01/2016   Diabetes (HCC)    Ejection fraction < 50%    Frequent PVCs    Gilbert's syndrome    Hyperlipidemia    Mild diastolic dysfunction    Mitral regurgitation    a. mod by echo 10/207.   NICM (nonischemic cardiomyopathy) ([place])    Pericardial effusion    a. small by echo 01/2016.   Pulmonary hypertension ([place])    Right BBB/left post fasc block    Thrombocytopenia [facility])    Past Surgical History:  Procedure Laterality Date   BIV ICD INSERTION CRT-D N/A 01/26/2018   Procedure: BIV ICD INSERTION  CRT-D;  Surgeon: Constance Haw, MD;  Location: [place] [facility];  Service: Cardiovascular;  Laterality: N/A;   CARDIAC CATHETERIZATION N/A 12/20/2014   Procedure: Right/Left Heart Cath and Coronary Angiography;  Surgeon: Jerline Pain, MD;  Location: [place] [facility];  Service: Cardiovascular;  Laterality: N/A;   CARDIOVERSION N/A 10/24/2018   Procedure: CARDIOVERSION;  Surgeon: Thayer Headings, MD;  Location: [facility] ENDOSCOPY;  Service: Cardiovascular;  Laterality: N/A;   HERNIA REPAIR  06/2017     Current Outpatient Medications  Medication Sig Dispense Refill   acetaminophen (TYLENOL) 650 MG CR tablet Take 650 mg by mouth every 8 (eight) hours as needed for pain.     alfuzosin (UROXATRAL) 10 MG 24 hr tablet Take 10 mg by mouth at bedtime.   3   amiodarone (PACERONE) 200 MG tablet Take 1 tablet (200 mg total) by mouth daily. 90 tablet 3   atorvastatin (LIPITOR) 10 MG tablet Take 10 mg by mouth at bedtime.     bisoprolol (ZEBETA) 5 MG tablet Take 1 tablet (5 mg total) by mouth daily. 90 tablet 3   Cholecalciferol (VITAMIN D3) 25 MCG (1000 UT) CAPS Take 1,000 Units by mouth every other day.      finasteride (PROSCAR) 5 MG tablet Take 5 mg by mouth daily.   2   furosemide (LASIX) 40 MG tablet Take 40 mg by mouth as needed for fluid or edema.  glimepiride (AMARYL) 2 MG tablet Take 2 mg by mouth at bedtime.     hydrALAZINE (APRESOLINE) 25 MG tablet Take 25 mg by mouth 2 (two) times daily.     isosorbide mononitrate (IMDUR) 30 MG 24 hr tablet Take 30 mg by mouth daily.     potassium chloride (KLOR-CON) 10 MEQ tablet Take 1 tablet (10 mEq total) by mouth daily. 90 tablet 3   Semaglutide (RYBELSUS) 7 MG TABS 1 tablet at least 30 minutes before first food, beverage or other oral medicine of the day     traMADol (ULTRAM) 50 MG tablet Take by mouth every 6 (six) hours as needed.     warfarin (COUMADIN) 5 MG tablet TAKE 1/2 TO 1 TABLET BY MOUTH ONCE DAILY AS DIRECTED by anticoagulation clinic  30 tablet 5   No current facility-administered medications for this visit.    Allergies:   Metformin hcl, Sulfa antibiotics, Ace inhibitors, Coreg [carvedilol], and Toprol xl [metoprolol tartrate]   Social History:  The patient  reports that he has never smoked. He has never used smokeless tobacco. He reports that he does not drink alcohol and does not use drugs.   Family History:  The patient's family history includes Hypertension in his mother; Irregular heart beat in his father; Stroke in his mother; Unexplained death in his father.   ROS:  Please see the history of present illness.   Otherwise, review of systems is positive for none.   All other systems are reviewed and negative.   PHYSICAL EXAM: VS:  BP 120/66   Pulse 84   Ht 5\' 11"  (1.803 m)   Wt 181 lb (82.1 kg)   SpO2 98%   BMI 25.24 kg/m  , BMI Body mass index is 25.24 kg/m. GEN: Well nourished, well developed, in no acute distress  HEENT: normal  Neck: no JVD, carotid bruits, or masses Cardiac: RRR; no murmurs, rubs, or gallops,no edema  Respiratory:  clear to auscultation bilaterally, normal work of breathing GI: soft, nontender, nondistended, + BS MS: no deformity or atrophy  Skin: warm and dry, device site well healed Neuro:  Strength and sensation are intact Psych: euthymic mood, full affect  EKG:  EKG is ordered today. Personal review of the ekg ordered shows A flutter, V pace  Personal review of the device interrogation today. Results in [place]: 07/15/2021: ALT 27; BUN 16; Creatinine, Ser 1.81; Hemoglobin 13.3; Platelets 119; Potassium 4.2; Sodium 139; TSH 11.600    Lipid Panel  No results found for: "CHOL", "TRIG", "HDL", "CHOLHDL", "VLDL", "LDLCALC", "LDLDIRECT"   Wt Readings from Last 3 Encounters:  04/23/22 181 lb (82.1 kg)  10/28/21 175 lb (79.4 kg)  07/15/21 184 lb (83.5 kg)      Other studies Reviewed: Additional studies/ records that were reviewed today include: TTE 11/06/21   Review of the above records today demonstrates:   1. Akinesis of the inferior and inferolateral walls and hypokinesis of  remaining walls; overall severe LV dysfunction.   2. Left ventricular ejection fraction, by estimation, is 25 to 30%. The  left ventricle has severely decreased function. The left ventricle  demonstrates regional wall motion abnormalities (see scoring  diagram/findings for description). The left  ventricular internal cavity size was severely dilated. Left ventricular  diastolic parameters are consistent with Grade II diastolic dysfunction  (pseudonormalization). Elevated left atrial pressure.   3. Right ventricular systolic function is mildly reduced. The right  ventricular size is mildly enlarged.   4.  Left atrial size was severely dilated.   5. Right atrial size was moderately dilated.   6. A small pericardial effusion is present.   7. The mitral valve is normal in structure. Severe mitral valve  regurgitation. No evidence of mitral stenosis.   8. Tricuspid valve regurgitation is moderate.   9. The aortic valve is tricuspid. Aortic valve regurgitation is moderate  to severe. No aortic stenosis is present.  10. Aortic dilatation noted. There is borderline dilatation of the aortic  root, measuring 39 mm.  11. The inferior vena cava is normal in size with greater than 50%  respiratory variability, suggesting right atrial pressure of 3 mmHg.   ASSESSMENT AND PLAN:  1.  Chronic systolic heart failure: Due to nonischemic cardiomyopathy.  Optimal medical therapy per primary cardiology.  Status post Medtronic CRT-D implanted 01/26/2018.  Device function appropriately.  No changes at this time.  2.  PVCs: Has decreased BiV pacing.  Currently on amiodarone 200 mg daily.  3.  Hypertension: Currently well-controlled  4.  Persistent atrial fibrillation/flutter: Currently on warfarin and amiodarone.  CHA2DS2-VASc of 4.  He is unfortunately in atrial fibrillation/flutter  today.  Feeling short of breath and fatigue.  Due to that, we Samanvitha Germany plan for cardioversion.  5.  Ventricular tachycardia: ATP 02/13/2019.  Remains on amiodarone.  Has had no further episodes.  6.  High risk medication monitoring: Currently on amiodarone for PVCs and atrial fibrillation as above.  EKG and labs without major abnormality.  Wrenly Lauritsen check amiodarone monitoring labs today.   Current medicines are reviewed at length with the patient today.   The patient does not have concerns regarding his medicines.  The following changes were made today: None  Labs/ tests ordered today include:  Orders Placed This Encounter  Procedures   Protime-INR   TSH   Hepatic function panel   EKG 12-Lead    Disposition:   FU 3 months  Signed, Lizabeth Fellner Meredith Leeds, MD  04/23/2022 3:56 PM     [facility] [address] [place] [address] [phone] (office) [phone] (fax)

## 2022-04-24 LAB — HEPATIC FUNCTION PANEL
ALT: 11 IU/L (ref 0–44)
AST: 13 IU/L (ref 0–40)
Albumin: 3.9 g/dL (ref 3.7–4.7)
Alkaline Phosphatase: 68 IU/L (ref 44–121)
Bilirubin Total: 1.4 mg/dL — ABNORMAL HIGH (ref 0.0–1.2)
Bilirubin, Direct: 0.39 mg/dL (ref 0.00–0.40)
Total Protein: 6.3 g/dL (ref 6.0–8.5)

## 2022-04-24 LAB — TSH: TSH: 12.1 u[IU]/mL — ABNORMAL HIGH (ref 0.450–4.500)

## 2022-04-24 LAB — PROTIME-INR
INR: 2 — ABNORMAL HIGH (ref 0.9–1.2)
Prothrombin Time: 20.4 s — ABNORMAL HIGH (ref 9.1–12.0)

## 2022-04-30 ENCOUNTER — Ambulatory Visit: Payer: No Typology Code available for payment source

## 2022-05-04 ENCOUNTER — Ambulatory Visit: Payer: No Typology Code available for payment source

## 2022-05-05 ENCOUNTER — Ambulatory Visit: Payer: Medicare Other | Attending: Cardiology | Admitting: *Deleted

## 2022-05-05 DIAGNOSIS — I483 Typical atrial flutter: Secondary | ICD-10-CM

## 2022-05-05 DIAGNOSIS — Z7901 Long term (current) use of anticoagulants: Secondary | ICD-10-CM

## 2022-05-05 LAB — POCT INR: INR: 2.1 (ref 2.0–3.0)

## 2022-05-05 NOTE — Patient Instructions (Addendum)
Description   Today take 1.5 tablets of warfarin then continue taking Warfarin 1/2 tablet daily except for 1 tablet on Tuesdays and Saturdays. Stay consistent with greens each week. Recheck INR in 1 week pending Cardioversion at 9am. Coumadin Clinic [phone].

## 2022-05-13 ENCOUNTER — Encounter (HOSPITAL_COMMUNITY): Payer: Self-pay | Admitting: Cardiology

## 2022-05-13 ENCOUNTER — Ambulatory Visit (HOSPITAL_COMMUNITY): Payer: Medicare Other | Admitting: Certified Registered"

## 2022-05-13 ENCOUNTER — Other Ambulatory Visit: Payer: Self-pay

## 2022-05-13 ENCOUNTER — Ambulatory Visit (INDEPENDENT_AMBULATORY_CARE_PROVIDER_SITE_OTHER): Payer: Medicare Other

## 2022-05-13 ENCOUNTER — Ambulatory Visit (HOSPITAL_BASED_OUTPATIENT_CLINIC_OR_DEPARTMENT_OTHER): Payer: Medicare Other | Admitting: Certified Registered"

## 2022-05-13 ENCOUNTER — Encounter (HOSPITAL_COMMUNITY)
Admission: RE | Disposition: A | Discharge status: Home / Self Care | Payer: Self-pay | Source: Home / Self Care | Attending: Cardiology

## 2022-05-13 ENCOUNTER — Ambulatory Visit (HOSPITAL_COMMUNITY)
Admission: RE | Admit: 2022-05-13 | Discharge: 2022-05-13 | Disposition: A | Discharge status: Home / Self Care | Payer: Medicare Other | Attending: Cardiology | Admitting: Cardiology

## 2022-05-13 DIAGNOSIS — Z79899 Other long term (current) drug therapy: Secondary | ICD-10-CM | POA: Diagnosis not present

## 2022-05-13 DIAGNOSIS — I083 Combined rheumatic disorders of mitral, aortic and tricuspid valves: Secondary | ICD-10-CM | POA: Diagnosis not present

## 2022-05-13 DIAGNOSIS — N183 Chronic kidney disease, stage 3 unspecified: Secondary | ICD-10-CM | POA: Insufficient documentation

## 2022-05-13 DIAGNOSIS — I509 Heart failure, unspecified: Secondary | ICD-10-CM

## 2022-05-13 DIAGNOSIS — E785 Hyperlipidemia, unspecified: Secondary | ICD-10-CM | POA: Diagnosis not present

## 2022-05-13 DIAGNOSIS — I428 Other cardiomyopathies: Secondary | ICD-10-CM | POA: Diagnosis not present

## 2022-05-13 DIAGNOSIS — Z7901 Long term (current) use of anticoagulants: Secondary | ICD-10-CM | POA: Diagnosis not present

## 2022-05-13 DIAGNOSIS — I5022 Chronic systolic (congestive) heart failure: Secondary | ICD-10-CM | POA: Diagnosis not present

## 2022-05-13 DIAGNOSIS — I272 Pulmonary hypertension, unspecified: Secondary | ICD-10-CM | POA: Insufficient documentation

## 2022-05-13 DIAGNOSIS — E119 Type 2 diabetes mellitus without complications: Secondary | ICD-10-CM | POA: Diagnosis not present

## 2022-05-13 DIAGNOSIS — I4819 Other persistent atrial fibrillation: Secondary | ICD-10-CM | POA: Diagnosis not present

## 2022-05-13 DIAGNOSIS — Z95810 Presence of automatic (implantable) cardiac defibrillator: Secondary | ICD-10-CM | POA: Insufficient documentation

## 2022-05-13 DIAGNOSIS — I130 Hypertensive heart and chronic kidney disease with heart failure and stage 1 through stage 4 chronic kidney disease, or unspecified chronic kidney disease: Secondary | ICD-10-CM | POA: Insufficient documentation

## 2022-05-13 DIAGNOSIS — I4892 Unspecified atrial flutter: Secondary | ICD-10-CM

## 2022-05-13 DIAGNOSIS — I110 Hypertensive heart disease with heart failure: Secondary | ICD-10-CM

## 2022-05-13 DIAGNOSIS — E1122 Type 2 diabetes mellitus with diabetic chronic kidney disease: Secondary | ICD-10-CM | POA: Diagnosis not present

## 2022-05-13 DIAGNOSIS — Z7984 Long term (current) use of oral hypoglycemic drugs: Secondary | ICD-10-CM | POA: Insufficient documentation

## 2022-05-13 DIAGNOSIS — I483 Typical atrial flutter: Secondary | ICD-10-CM

## 2022-05-13 DIAGNOSIS — I4891 Unspecified atrial fibrillation: Secondary | ICD-10-CM | POA: Diagnosis not present

## 2022-05-13 HISTORY — PX: CARDIOVERSION: SHX1299

## 2022-05-13 LAB — POCT I-STAT, CHEM 8
BUN: 20 mg/dL (ref 8–23)
Calcium, Ion: 1.16 mmol/L (ref 1.15–1.40)
Chloride: 106 mmol/L (ref 98–111)
Creatinine, Ser: 2 mg/dL — ABNORMAL HIGH (ref 0.61–1.24)
Glucose, Bld: 137 mg/dL — ABNORMAL HIGH (ref 70–99)
HCT: 37 % — ABNORMAL LOW (ref 39.0–52.0)
Hemoglobin: 12.6 g/dL — ABNORMAL LOW (ref 13.0–17.0)
Potassium: 3.4 mmol/L — ABNORMAL LOW (ref 3.5–5.1)
Sodium: 143 mmol/L (ref 135–145)
TCO2: 26 mmol/L (ref 22–32)

## 2022-05-13 LAB — POCT INR: INR: 3.2 — AB (ref 2.0–3.0)

## 2022-05-13 SURGERY — CARDIOVERSION
Anesthesia: Monitor Anesthesia Care

## 2022-05-13 MED ORDER — SODIUM CHLORIDE 0.9 % IV SOLN: INTRAVENOUS | Status: DC

## 2022-05-13 MED ORDER — PROPOFOL 10 MG/ML IV BOLUS
INTRAVENOUS | Status: DC | PRN
  Administered 2022-05-13 (×2): 20 mg via INTRAVENOUS
  Administered 2022-05-13: 30 mg via INTRAVENOUS

## 2022-05-13 NOTE — Discharge Instructions (Signed)

## 2022-05-13 NOTE — Anesthesia Procedure Notes (Signed)
Procedure Name: General with mask airway Date/Time: 05/13/2022 9:40 AM  Performed by: Griffin Dakin, CRNAPre-anesthesia Checklist: Patient identified, Emergency Drugs available, Suction available, Patient being monitored and Timeout performed Patient Re-evaluated:Patient Re-evaluated prior to induction Oxygen Delivery Method: Simple face mask Induction Type: IV induction Placement Confirmation: positive ETCO2 and breath sounds checked- equal and bilateral

## 2022-05-13 NOTE — Interval H&P Note (Signed)
History and Physical Interval Note:  05/13/2022 9:39 AM  Carlos Gibson  has presented today for surgery, with the diagnosis of AFIB AFLUTTER.  The various methods of treatment have been discussed with the patient and family. After consideration of risks, benefits and other options for treatment, the patient has consented to  Procedure(s): CARDIOVERSION (N/A) as a surgical intervention.  The patient's history has been reviewed, patient examined, no change in status, stable for surgery.  I have reviewed the patient's chart and labs.  Questions were answered to the patient's satisfaction.     Sumiye Hirth Harrell Gave

## 2022-05-13 NOTE — Transfer of Care (Signed)
Immediate Anesthesia Transfer of Care Note  Patient: Carlos Gibson  Procedure(s) Performed: CARDIOVERSION  Patient Location: Endoscopy Unit  Anesthesia Type:General  Level of Consciousness: awake, alert , and oriented  Airway & Oxygen Therapy: Patient Spontanous Breathing and Patient connected to face mask oxygen  Post-op Assessment: Report given to RN and Post -op Vital signs reviewed and stable  Post vital signs: Reviewed and stable  Last Vitals:  Vitals Value Taken Time  BP 101/72   Temp    Pulse 70   Resp 16   SpO2 99     Last Pain:  Vitals:   05/13/22 0907  TempSrc: Tympanic  PainSc: 0-No pain         Complications: No notable events documented.

## 2022-05-13 NOTE — Anesthesia Preprocedure Evaluation (Signed)
Anesthesia Evaluation  Patient identified by MRN, date of birth, ID band Patient awake    Reviewed: Allergy & Precautions, NPO status , Patient's Chart, lab work & pertinent test results  Airway Mallampati: II  TM Distance: >3 FB Neck ROM: Full    Dental  (+) Upper Dentures   Pulmonary neg pulmonary ROS   breath sounds clear to auscultation       Cardiovascular hypertension, Pt. on medications (-) angina +CHF  + dysrhythmias Atrial Fibrillation + pacemaker  Rhythm:Irregular  1. Akinesis of the inferior and inferolateral walls and hypokinesis of  remaining walls; overall severe LV dysfunction.   2. Left ventricular ejection fraction, by estimation, is 25 to 30%. The  left ventricle has severely decreased function. The left ventricle  demonstrates regional wall motion abnormalities (see scoring  diagram/findings for description). The left  ventricular internal cavity size was severely dilated. Left ventricular  diastolic parameters are consistent with Grade II diastolic dysfunction  (pseudonormalization). Elevated left atrial pressure.   3. Right ventricular systolic function is mildly reduced. The right  ventricular size is mildly enlarged.   4. Left atrial size was severely dilated.   5. Right atrial size was moderately dilated.   6. A small pericardial effusion is present.   7. The mitral valve is normal in structure. Severe mitral valve  regurgitation. No evidence of mitral stenosis.   8. Tricuspid valve regurgitation is moderate.   9. The aortic valve is tricuspid. Aortic valve regurgitation is moderate  to severe. No aortic stenosis is present.  10. Aortic dilatation noted. There is borderline dilatation of the aortic  root, measuring 39 mm.  11. The inferior vena cava is normal in size with greater than 50%  respiratory variability, suggesting right atrial pressure of 3 mmHg.     Neuro/Psych negative neurological ROS   negative psych ROS   GI/Hepatic negative GI ROS, Neg liver ROS,,,  Endo/Other  diabetes    Renal/GU Renal InsufficiencyRenal diseaseLab Results      Component                Value               Date                      CREATININE               2.00 (H)            05/13/2022            Lab Results      Component                Value               Date                      K                        3.4 (L)             05/13/2022                Musculoskeletal   Abdominal   Peds  Hematology  (+) Blood dyscrasia Lab Results      Component                Value  Date                      WBC                      3.2 (L)             07/15/2021                HGB                      12.6 (L)            05/13/2022                HCT                      37.0 (L)            05/13/2022                MCV                      85                  07/15/2021                PLT                      119 (L)             07/15/2021            coumadin   Anesthesia Other Findings   Reproductive/Obstetrics                             Anesthesia Physical Anesthesia Plan  ASA: 3  Anesthesia Plan:    Post-op Pain Management: Minimal or no pain anticipated   Induction: Intravenous  PONV Risk Score and Plan: 1 and Treatment may vary due to age or medical condition  Airway Management Planned: Mask  Additional Equipment: None  Intra-op Plan:   Post-operative Plan:   Informed Consent: I have reviewed the patients History and Physical, chart, labs and discussed the procedure including the risks, benefits and alternatives for the proposed anesthesia with the patient or authorized representative who has indicated his/her understanding and acceptance.     Dental advisory given  Plan Discussed with: CRNA  Anesthesia Plan Comments:        Anesthesia Quick Evaluation

## 2022-05-13 NOTE — Patient Instructions (Signed)
Description   Continue taking Warfarin 1/2 tablet daily except for 1 tablet on Tuesdays and Saturdays.  Stay consistent with greens each week.  Recheck INR in 1 week post procedure  Coumadin Clinic [phone].

## 2022-05-13 NOTE — CV Procedure (Signed)
Procedure:   DCCV  Indication:  Symptomatic atrial flutter  Procedure Note:  The patient signed informed consent.  They have had had therapeutic anticoagulation with coumadin greater than 3 weeks.  Anesthesia was administered by Dr. Ermalene Postin.  Patient received 0 mg IV lidocaine and 70 mg IV propofol.Adequate airway was maintained throughout and vital followed per protocol.  They were cardioverted x 1 with 120J of biphasic synchronized energy.  They converted to atrial paced, ventricular paced rhythm, confirmed at bedside.  There were no apparent complications.  The patient had normal neuro status and respiratory status post procedure with vitals stable as recorded elsewhere.    Follow up:  They will continue on current medical therapy and follow up with cardiology as scheduled.  Buford Dresser, MD PhD 05/13/2022 9:52 AM

## 2022-05-14 NOTE — Anesthesia Postprocedure Evaluation (Signed)
Anesthesia Post Note  Patient: Carlos Gibson  Procedure(s) Performed: CARDIOVERSION     Patient location during evaluation: Endoscopy Anesthesia Type: General Level of consciousness: awake and alert Pain management: pain level controlled Vital Signs Assessment: post-procedure vital signs reviewed and stable Respiratory status: spontaneous breathing, nonlabored ventilation and respiratory function stable Cardiovascular status: blood pressure returned to baseline and stable Postop Assessment: no apparent nausea or vomiting Anesthetic complications: no   No notable events documented.  Last Vitals:  Vitals:   05/13/22 1005 05/13/22 1015  BP: 126/64 129/62  Pulse: 69 71  Resp: (!) 22 16  Temp:    SpO2: 100% 98%    Last Pain:  Vitals:   05/13/22 1015  TempSrc:   PainSc: 0-No pain                 Cendy Oconnor

## 2022-05-16 ENCOUNTER — Encounter (HOSPITAL_COMMUNITY): Payer: Self-pay | Admitting: Cardiology

## 2022-05-20 ENCOUNTER — Ambulatory Visit: Payer: Medicare Other | Attending: Internal Medicine

## 2022-05-20 DIAGNOSIS — Z7901 Long term (current) use of anticoagulants: Secondary | ICD-10-CM | POA: Diagnosis not present

## 2022-05-20 DIAGNOSIS — I483 Typical atrial flutter: Secondary | ICD-10-CM | POA: Diagnosis not present

## 2022-05-20 LAB — POCT INR: INR: 4.3 — AB (ref 2.0–3.0)

## 2022-05-20 NOTE — Patient Instructions (Signed)
Description   Hold today's dose and eat greens and then continue taking Warfarin 1/2 tablet daily except for 1 tablet on Tuesdays and Saturdays.  Stay consistent with greens each week.  Recheck INR in 2 weeks Coumadin Clinic [phone].

## 2022-05-27 ENCOUNTER — Ambulatory Visit (HOSPITAL_COMMUNITY)
Admission: RE | Admit: 2022-05-27 | Discharge: 2022-05-27 | Disposition: A | Discharge status: Home / Self Care | Payer: Medicare Other | Source: Ambulatory Visit | Attending: Nurse Practitioner | Admitting: Nurse Practitioner

## 2022-05-27 ENCOUNTER — Encounter (HOSPITAL_COMMUNITY): Payer: Self-pay | Admitting: Nurse Practitioner

## 2022-05-27 VITALS — BP 120/64 | HR 70 | Ht 71.0 in | Wt 177.4 lb

## 2022-05-27 DIAGNOSIS — I5022 Chronic systolic (congestive) heart failure: Secondary | ICD-10-CM | POA: Diagnosis not present

## 2022-05-27 DIAGNOSIS — I110 Hypertensive heart disease with heart failure: Secondary | ICD-10-CM | POA: Diagnosis not present

## 2022-05-27 DIAGNOSIS — I4819 Other persistent atrial fibrillation: Secondary | ICD-10-CM | POA: Insufficient documentation

## 2022-05-27 NOTE — Progress Notes (Addendum)
Primary Care Physician: [name], MD Referring Physician: Dr. Curt Bears EP: Dr. Lysbeth Penner is a 82 y.o. male with a h/o afib on amiodarone that was seen by Dr. Curt Bears 04/23/22 and found to be in persistent afib. He was scheduled for CV 2/7 and now is here for f/u. He remains in [place] dual paced). He is feeling better with improvement in lightheadedness. He conatiues on warfarin, for CHA2DS2VASc  score of at least 5.   Today, he denies symptoms of palpitations, chest pain, shortness of breath, orthopnea, PND, lower extremity edema, dizziness, presyncope, syncope, or neurologic sequela. The patient is tolerating medications without difficulties and is otherwise without complaint today.   Past Medical History:  Diagnosis Date   Aortic regurgitation    a. mild AI by echo 01/2016.   Aortic root dilation (HCC)    Aortic valve regurgitation    Bifascicular block    Chronic systolic CHF (congestive heart failure) (HCC)    CKD (chronic kidney disease), stage III ([place]) 01/01/2016   Diabetes (HCC)    Ejection fraction < 50%    Frequent PVCs    Gilbert's syndrome    Hyperlipidemia    Mild diastolic dysfunction    Mitral regurgitation    a. mod by echo 10/207.   NICM (nonischemic cardiomyopathy) ([place])    Pericardial effusion    a. small by echo 01/2016.   Pulmonary hypertension ([place])    Right BBB/left post fasc block    Thrombocytopenia [facility])    Past Surgical History:  Procedure Laterality Date   BIV ICD INSERTION CRT-D N/A 01/26/2018   Procedure: BIV ICD INSERTION CRT-D;  Surgeon: Constance Haw, MD;  Location: [facility];  Service: Cardiovascular;  Laterality: N/A;   CARDIAC CATHETERIZATION N/A 12/20/2014   Procedure: Right/Left Heart Cath and Coronary Angiography;  Surgeon: Jerline Pain, MD;  Location: [facility];  Service: Cardiovascular;  Laterality: N/A;   CARDIOVERSION N/A 10/24/2018   Procedure: CARDIOVERSION;  Surgeon: Acie Fredrickson Wonda Cheng,  MD;  Location: [facility];  Service: Cardiovascular;  Laterality: N/A;   CARDIOVERSION N/A 05/13/2022   Procedure: CARDIOVERSION;  Surgeon: Buford Dresser, MD;  Location: [facility] ENDOSCOPY;  Service: Cardiovascular;  Laterality: N/A;   HERNIA REPAIR  06/2017    Current Outpatient Medications  Medication Sig Dispense Refill   alfuzosin (UROXATRAL) 10 MG 24 hr tablet Take 10 mg by mouth at bedtime.   3   amiodarone (PACERONE) 200 MG tablet Take 1 tablet (200 mg total) by mouth daily. 90 tablet 3   atorvastatin (LIPITOR) 10 MG tablet Take 10 mg by mouth at bedtime.     bisoprolol (ZEBETA) 5 MG tablet Take 1 tablet (5 mg total) by mouth daily. 90 tablet 3   finasteride (PROSCAR) 5 MG tablet Take 5 mg by mouth daily.   2   furosemide (LASIX) 40 MG tablet Take 40 mg by mouth as needed for fluid or edema.     hydrALAZINE (APRESOLINE) 25 MG tablet Take 25 mg by mouth 2 (two) times daily.     isosorbide mononitrate (IMDUR) 30 MG 24 hr tablet Take 30 mg by mouth daily.     potassium chloride (KLOR-CON) 10 MEQ tablet Take 1 tablet (10 mEq total) by mouth daily. 90 tablet 3   Semaglutide (RYBELSUS) 7 MG TABS Take 7 mg by mouth daily before breakfast.     traMADol (ULTRAM) 50 MG tablet Take 50 mg by mouth every 6 (six) hours  as needed for moderate pain.     warfarin (COUMADIN) 5 MG tablet TAKE 1/2 TO 1 TABLET BY MOUTH ONCE DAILY AS DIRECTED by anticoagulation clinic (Patient taking differently: Take 2.5-5 mg by mouth See admin instructions. Take 2.5 mg on Sun, Mon, Wed, Thurs, and Fri Take 5 mg on Tues and Sat) 30 tablet 5   No current facility-administered medications for this encounter.    Allergies  Allergen Reactions   Metformin Hcl Diarrhea   Sulfa Antibiotics     Unknown reaction   Ace Inhibitors Cough   Coreg [Carvedilol] Other (See Comments)    "feels bad"   Toprol Xl [Metoprolol Tartrate] Other (See Comments)    "feels bad"    Social History   Socioeconomic History   Marital  status: Married    Spouse name: Not on file   Number of children: Not on file   Years of education: Not on file   Highest education level: Not on file  Occupational History   Not on file  Tobacco Use   Smoking status: Never   Smokeless tobacco: Never  Vaping Use   Vaping Use: Never used  Substance and Sexual Activity   Alcohol use: No    Alcohol/week: 0.0 standard drinks of alcohol   Drug use: No   Sexual activity: Not on file  Other Topics Concern   Not on file  Social History Narrative   Not on file   Social Determinants of Health   Financial Resource Strain: Not on file  Food Insecurity: Not on file  Transportation Needs: Not on file  Physical Activity: Not on file  Stress: Not on file  Social Connections: Not on file  Intimate Partner Violence: Not on file    Family History  Problem Relation Age of Onset   Hypertension Mother    Stroke Mother    Irregular heart beat Father    Unexplained death Father     ROS- All systems are reviewed and negative except as per the HPI above  Physical Exam: Vitals:   05/27/22 1308  BP: 120/64  Pulse: 70  Weight: 80.5 kg  Height: 5' 11"$  (1.803 m)   Wt Readings from Last 3 Encounters:  05/27/22 80.5 kg  05/13/22 84.8 kg  04/23/22 82.1 kg    Labs: Lab Results  Component Value Date   NA 143 05/13/2022   K 3.4 (L) 05/13/2022   CL 106 05/13/2022   CO2 26 07/15/2021   GLUCOSE 137 (H) 05/13/2022   BUN 20 05/13/2022   CREATININE 2.00 (H) 05/13/2022   CALCIUM 9.1 07/15/2021   MG 2.2 04/12/2019   Lab Results  Component Value Date   INR 4.3 (A) 05/20/2022   No results found for: "CHOL", "HDL", "LDLCALC", "TRIG"   GEN- The patient is well appearing, alert and oriented x 3 today.   Head- normocephalic, atraumatic Eyes-  Sclera clear, conjunctiva pink Ears- hearing intact Oropharynx- clear Neck- supple, no JVP Lymph- no cervical lymphadenopathy Lungs- Clear to ausculation bilaterally, normal work of  breathing Heart- Regular rate and rhythm, no murmurs, rubs or gallops, PMI not laterally displaced GI- soft, NT, ND, + BS Extremities- no clubbing, cyanosis, or edema MS- no significant deformity or atrophy Skin- no rash or lesion Psych- euthymic mood, full affect Neuro- strength and sensation are intact  EKG-Vent. rate 70 BPM PR interval 166 ms QRS duration 252 ms QT/QTcB 590/637 ms P-R-T axes 38 -82 64 AV dual-paced rhythm Abnormal ECG When compared with ECG of  13-May-2022 09:55, PREVIOUS ECG IS PRESENT    Assessment and Plan:  1. Persistent afib Successfully cardioverted 2/7 and reamins in av dula paced rhythm He feel improved Continue amiodarone 200 mg daily  Continue bisoprolol 5 mg qd  2. CHA2DS2VASc  score of % Continue warfarin   3. HTN Stable   4. CHF Appears normovolemic   F/u with remote checks and Dr. Curt Bears as scheduled   Geroge Baseman. Aiyah Scarpelli, [facility] [address] [place], [place] [address] [phone]

## 2022-06-03 ENCOUNTER — Ambulatory Visit: Payer: Medicare Other | Attending: Cardiovascular Disease

## 2022-06-03 DIAGNOSIS — I483 Typical atrial flutter: Secondary | ICD-10-CM | POA: Diagnosis not present

## 2022-06-03 DIAGNOSIS — Z7901 Long term (current) use of anticoagulants: Secondary | ICD-10-CM | POA: Insufficient documentation

## 2022-06-03 LAB — POCT INR: INR: 2.9 (ref 2.0–3.0)

## 2022-06-03 NOTE — Patient Instructions (Signed)
Description   Continue taking Warfarin 1/2 tablet daily except for 1 tablet on Tuesdays and Saturdays.  Stay consistent with greens each week.  Recheck INR in 3 weeks Coumadin Clinic [phone].

## 2022-06-04 DIAGNOSIS — I129 Hypertensive chronic kidney disease with stage 1 through stage 4 chronic kidney disease, or unspecified chronic kidney disease: Secondary | ICD-10-CM | POA: Diagnosis not present

## 2022-06-04 DIAGNOSIS — E1121 Type 2 diabetes mellitus with diabetic nephropathy: Secondary | ICD-10-CM | POA: Diagnosis not present

## 2022-06-04 DIAGNOSIS — N184 Chronic kidney disease, stage 4 (severe): Secondary | ICD-10-CM | POA: Diagnosis not present

## 2022-06-04 DIAGNOSIS — I5020 Unspecified systolic (congestive) heart failure: Secondary | ICD-10-CM | POA: Diagnosis not present

## 2022-06-04 DIAGNOSIS — E7800 Pure hypercholesterolemia, unspecified: Secondary | ICD-10-CM | POA: Diagnosis not present

## 2022-06-05 DIAGNOSIS — N50812 Left testicular pain: Secondary | ICD-10-CM | POA: Diagnosis not present

## 2022-06-05 DIAGNOSIS — R3121 Asymptomatic microscopic hematuria: Secondary | ICD-10-CM | POA: Diagnosis not present

## 2022-06-23 DIAGNOSIS — R3121 Asymptomatic microscopic hematuria: Secondary | ICD-10-CM | POA: Diagnosis not present

## 2022-06-24 ENCOUNTER — Ambulatory Visit: Payer: No Typology Code available for payment source

## 2022-06-30 ENCOUNTER — Ambulatory Visit (INDEPENDENT_AMBULATORY_CARE_PROVIDER_SITE_OTHER): Payer: Medicare Other

## 2022-06-30 ENCOUNTER — Ambulatory Visit: Payer: Medicare Other | Attending: Cardiology

## 2022-06-30 DIAGNOSIS — I472 Ventricular tachycardia, unspecified: Secondary | ICD-10-CM

## 2022-06-30 DIAGNOSIS — Z7901 Long term (current) use of anticoagulants: Secondary | ICD-10-CM | POA: Diagnosis not present

## 2022-06-30 DIAGNOSIS — I483 Typical atrial flutter: Secondary | ICD-10-CM | POA: Insufficient documentation

## 2022-06-30 LAB — CUP PACEART REMOTE DEVICE CHECK
Battery Remaining Longevity: 8 mo
Battery Voltage: 2.85 V
Brady Statistic AP VP Percent: 98.11 %
Brady Statistic AP VS Percent: 0.13 %
Brady Statistic AS VP Percent: 1.73 %
Brady Statistic AS VS Percent: 0.03 %
Brady Statistic RA Percent Paced: 97.9 %
Brady Statistic RV Percent Paced: 99.53 %
Date Time Interrogation Session: 20240326043625
HighPow Impedance: 51 Ohm
Implantable Lead Connection Status: 753985
Implantable Lead Connection Status: 753985
Implantable Lead Connection Status: 753985
Implantable Lead Implant Date: 20191023
Implantable Lead Implant Date: 20191023
Implantable Lead Implant Date: 20191023
Implantable Lead Location: 753858
Implantable Lead Location: 753859
Implantable Lead Location: 753860
Implantable Lead Model: 4598
Implantable Lead Model: 5076
Implantable Lead Model: 6935
Implantable Pulse Generator Implant Date: 20191023
Lead Channel Impedance Value: 145.871
Lead Channel Impedance Value: 145.871
Lead Channel Impedance Value: 145.871
Lead Channel Impedance Value: 161.5 Ohm
Lead Channel Impedance Value: 161.5 Ohm
Lead Channel Impedance Value: 266 Ohm
Lead Channel Impedance Value: 266 Ohm
Lead Channel Impedance Value: 323 Ohm
Lead Channel Impedance Value: 323 Ohm
Lead Channel Impedance Value: 323 Ohm
Lead Channel Impedance Value: 342 Ohm
Lead Channel Impedance Value: 342 Ohm
Lead Channel Impedance Value: 399 Ohm
Lead Channel Impedance Value: 494 Ohm
Lead Channel Impedance Value: 513 Ohm
Lead Channel Impedance Value: 513 Ohm
Lead Channel Impedance Value: 570 Ohm
Lead Channel Impedance Value: 570 Ohm
Lead Channel Pacing Threshold Amplitude: 0.625 V
Lead Channel Pacing Threshold Amplitude: 0.875 V
Lead Channel Pacing Threshold Amplitude: 1.25 V
Lead Channel Pacing Threshold Pulse Width: 0.4 ms
Lead Channel Pacing Threshold Pulse Width: 0.4 ms
Lead Channel Pacing Threshold Pulse Width: 0.8 ms
Lead Channel Sensing Intrinsic Amplitude: 2.625 mV
Lead Channel Sensing Intrinsic Amplitude: 2.625 mV
Lead Channel Sensing Intrinsic Amplitude: 8.125 mV
Lead Channel Sensing Intrinsic Amplitude: 8.125 mV
Lead Channel Setting Pacing Amplitude: 1.5 V
Lead Channel Setting Pacing Amplitude: 2 V
Lead Channel Setting Pacing Amplitude: 3.25 V
Lead Channel Setting Pacing Pulse Width: 0.4 ms
Lead Channel Setting Pacing Pulse Width: 0.8 ms
Lead Channel Setting Sensing Sensitivity: 0.3 mV
Zone Setting Status: 755011
Zone Setting Status: 755011

## 2022-06-30 LAB — POCT INR: INR: 3.4 — AB (ref 2.0–3.0)

## 2022-06-30 NOTE — Patient Instructions (Signed)
Description   Skip today's dosage of Warfarin, then start taking Warfarin 1/2 tablet daily except for 1 tablet on Saturdays.  Stay consistent with greens each week.  Recheck INR in 3 weeks Coumadin Clinic [phone].

## 2022-07-13 DIAGNOSIS — N134 Hydroureter: Secondary | ICD-10-CM | POA: Diagnosis not present

## 2022-07-13 DIAGNOSIS — R3121 Asymptomatic microscopic hematuria: Secondary | ICD-10-CM | POA: Diagnosis not present

## 2022-07-13 DIAGNOSIS — N281 Cyst of kidney, acquired: Secondary | ICD-10-CM | POA: Diagnosis not present

## 2022-07-13 DIAGNOSIS — N1330 Unspecified hydronephrosis: Secondary | ICD-10-CM | POA: Diagnosis not present

## 2022-07-14 DIAGNOSIS — R338 Other retention of urine: Secondary | ICD-10-CM | POA: Diagnosis not present

## 2022-07-14 DIAGNOSIS — N130 Hydronephrosis with ureteropelvic junction obstruction: Secondary | ICD-10-CM | POA: Diagnosis not present

## 2022-07-14 DIAGNOSIS — N401 Enlarged prostate with lower urinary tract symptoms: Secondary | ICD-10-CM | POA: Diagnosis not present

## 2022-07-17 DIAGNOSIS — I4891 Unspecified atrial fibrillation: Secondary | ICD-10-CM | POA: Diagnosis not present

## 2022-07-17 DIAGNOSIS — E1122 Type 2 diabetes mellitus with diabetic chronic kidney disease: Secondary | ICD-10-CM | POA: Diagnosis not present

## 2022-07-17 DIAGNOSIS — I5020 Unspecified systolic (congestive) heart failure: Secondary | ICD-10-CM | POA: Diagnosis not present

## 2022-07-17 DIAGNOSIS — N400 Enlarged prostate without lower urinary tract symptoms: Secondary | ICD-10-CM | POA: Diagnosis not present

## 2022-07-17 DIAGNOSIS — N2581 Secondary hyperparathyroidism of renal origin: Secondary | ICD-10-CM | POA: Diagnosis not present

## 2022-07-17 DIAGNOSIS — D631 Anemia in chronic kidney disease: Secondary | ICD-10-CM | POA: Diagnosis not present

## 2022-07-17 DIAGNOSIS — N1832 Chronic kidney disease, stage 3b: Secondary | ICD-10-CM | POA: Diagnosis not present

## 2022-07-17 DIAGNOSIS — I129 Hypertensive chronic kidney disease with stage 1 through stage 4 chronic kidney disease, or unspecified chronic kidney disease: Secondary | ICD-10-CM | POA: Diagnosis not present

## 2022-07-19 LAB — LAB REPORT - SCANNED: EGFR: 39

## 2022-07-21 ENCOUNTER — Ambulatory Visit: Payer: Medicare Other | Attending: Internal Medicine

## 2022-07-21 DIAGNOSIS — I483 Typical atrial flutter: Secondary | ICD-10-CM

## 2022-07-21 DIAGNOSIS — Z7901 Long term (current) use of anticoagulants: Secondary | ICD-10-CM | POA: Insufficient documentation

## 2022-07-21 LAB — POCT INR: INR: 2.6 (ref 2.0–3.0)

## 2022-07-21 NOTE — Patient Instructions (Signed)
Description   Continue on same dosage of Warfarin 1/2 tablet daily except for 1 tablet on Saturdays.  Stay consistent with greens each week.  Recheck INR in 4 weeks Coumadin Clinic [phone].

## 2022-07-27 DIAGNOSIS — N401 Enlarged prostate with lower urinary tract symptoms: Secondary | ICD-10-CM | POA: Diagnosis not present

## 2022-07-27 DIAGNOSIS — R8279 Other abnormal findings on microbiological examination of urine: Secondary | ICD-10-CM | POA: Diagnosis not present

## 2022-07-27 DIAGNOSIS — R338 Other retention of urine: Secondary | ICD-10-CM | POA: Diagnosis not present

## 2022-08-04 ENCOUNTER — Encounter: Payer: Self-pay | Admitting: Nephrology

## 2022-08-11 NOTE — Progress Notes (Signed)
Remote ICD transmission.   

## 2022-08-18 ENCOUNTER — Ambulatory Visit: Payer: Medicare Other | Attending: Cardiology | Admitting: *Deleted

## 2022-08-18 DIAGNOSIS — Z7901 Long term (current) use of anticoagulants: Secondary | ICD-10-CM | POA: Insufficient documentation

## 2022-08-18 DIAGNOSIS — I483 Typical atrial flutter: Secondary | ICD-10-CM | POA: Diagnosis not present

## 2022-08-18 DIAGNOSIS — Z5181 Encounter for therapeutic drug level monitoring: Secondary | ICD-10-CM | POA: Diagnosis not present

## 2022-08-18 LAB — POCT INR: POC INR: 2.4

## 2022-08-18 NOTE — Patient Instructions (Addendum)
Description   Continue on same dosage of Warfarin 1/2 tablet daily except for 1 tablet on Saturdays.  Stay consistent with greens each week.  Recheck INR in 5 weeks Coumadin Clinic [phone].

## 2022-08-24 DIAGNOSIS — R338 Other retention of urine: Secondary | ICD-10-CM | POA: Diagnosis not present

## 2022-08-24 DIAGNOSIS — N130 Hydronephrosis with ureteropelvic junction obstruction: Secondary | ICD-10-CM | POA: Diagnosis not present

## 2022-09-07 DIAGNOSIS — R338 Other retention of urine: Secondary | ICD-10-CM | POA: Diagnosis not present

## 2022-09-18 DIAGNOSIS — E1121 Type 2 diabetes mellitus with diabetic nephropathy: Secondary | ICD-10-CM | POA: Diagnosis not present

## 2022-09-22 ENCOUNTER — Ambulatory Visit: Payer: Medicare Other | Attending: Internal Medicine

## 2022-09-22 DIAGNOSIS — I483 Typical atrial flutter: Secondary | ICD-10-CM | POA: Insufficient documentation

## 2022-09-22 DIAGNOSIS — Z7901 Long term (current) use of anticoagulants: Secondary | ICD-10-CM | POA: Insufficient documentation

## 2022-09-22 LAB — POCT INR: INR: 2.4 (ref 2.0–3.0)

## 2022-09-22 NOTE — Patient Instructions (Signed)
Continue on same dosage of Warfarin 1/2 tablet daily except for 1 tablet on Saturdays.  Stay consistent with greens each week.  Recheck INR in 6 weeks Coumadin Clinic [phone].

## 2022-09-25 DIAGNOSIS — N401 Enlarged prostate with lower urinary tract symptoms: Secondary | ICD-10-CM | POA: Diagnosis not present

## 2022-09-25 DIAGNOSIS — R338 Other retention of urine: Secondary | ICD-10-CM | POA: Diagnosis not present

## 2022-09-29 ENCOUNTER — Ambulatory Visit (INDEPENDENT_AMBULATORY_CARE_PROVIDER_SITE_OTHER): Payer: Medicare Other

## 2022-09-29 DIAGNOSIS — I4510 Unspecified right bundle-branch block: Secondary | ICD-10-CM

## 2022-09-30 LAB — CUP PACEART REMOTE DEVICE CHECK
Battery Remaining Longevity: 5 mo
Battery Voltage: 2.82 V
Brady Statistic AP VP Percent: 97.89 %
Brady Statistic AP VS Percent: 0.11 %
Brady Statistic AS VP Percent: 1.99 %
Brady Statistic AS VS Percent: 0.01 %
Brady Statistic RA Percent Paced: 97.68 %
Brady Statistic RV Percent Paced: 99.6 %
Date Time Interrogation Session: 20240625022503
HighPow Impedance: 62 Ohm
Implantable Lead Connection Status: 753985
Implantable Lead Connection Status: 753985
Implantable Lead Connection Status: 753985
Implantable Lead Implant Date: 20191023
Implantable Lead Implant Date: 20191023
Implantable Lead Implant Date: 20191023
Implantable Lead Location: 753858
Implantable Lead Location: 753859
Implantable Lead Location: 753860
Implantable Lead Model: 4598
Implantable Lead Model: 5076
Implantable Lead Model: 6935
Implantable Pulse Generator Implant Date: 20191023
Lead Channel Impedance Value: 166.114
Lead Channel Impedance Value: 166.114
Lead Channel Impedance Value: 166.114
Lead Channel Impedance Value: 171 Ohm
Lead Channel Impedance Value: 171 Ohm
Lead Channel Impedance Value: 304 Ohm
Lead Channel Impedance Value: 323 Ohm
Lead Channel Impedance Value: 342 Ohm
Lead Channel Impedance Value: 342 Ohm
Lead Channel Impedance Value: 342 Ohm
Lead Channel Impedance Value: 380 Ohm
Lead Channel Impedance Value: 399 Ohm
Lead Channel Impedance Value: 456 Ohm
Lead Channel Impedance Value: 570 Ohm
Lead Channel Impedance Value: 589 Ohm
Lead Channel Impedance Value: 589 Ohm
Lead Channel Impedance Value: 589 Ohm
Lead Channel Impedance Value: 627 Ohm
Lead Channel Pacing Threshold Amplitude: 0.5 V
Lead Channel Pacing Threshold Amplitude: 1.125 V
Lead Channel Pacing Threshold Amplitude: 1.375 V
Lead Channel Pacing Threshold Pulse Width: 0.4 ms
Lead Channel Pacing Threshold Pulse Width: 0.4 ms
Lead Channel Pacing Threshold Pulse Width: 0.8 ms
Lead Channel Sensing Intrinsic Amplitude: 3.25 mV
Lead Channel Sensing Intrinsic Amplitude: 3.25 mV
Lead Channel Sensing Intrinsic Amplitude: 9.625 mV
Lead Channel Sensing Intrinsic Amplitude: 9.625 mV
Lead Channel Setting Pacing Amplitude: 1.5 V
Lead Channel Setting Pacing Amplitude: 2.25 V
Lead Channel Setting Pacing Amplitude: 3.25 V
Lead Channel Setting Pacing Pulse Width: 0.4 ms
Lead Channel Setting Pacing Pulse Width: 0.8 ms
Lead Channel Setting Sensing Sensitivity: 0.3 mV
Zone Setting Status: 755011
Zone Setting Status: 755011

## 2022-10-01 DIAGNOSIS — N401 Enlarged prostate with lower urinary tract symptoms: Secondary | ICD-10-CM | POA: Diagnosis not present

## 2022-10-01 DIAGNOSIS — R338 Other retention of urine: Secondary | ICD-10-CM | POA: Diagnosis not present

## 2022-10-06 ENCOUNTER — Telehealth: Payer: Self-pay | Admitting: Cardiology

## 2022-10-06 NOTE — Telephone Encounter (Signed)
Patient with diagnosis of aflutter on warfarin for anticoagulation.    Procedure: Rezum BPH Date of procedure: 12/14/22  CHA2DS2-VASc Score = 5  This indicates a 7.2% annual risk of stroke. The patient's score is based upon: CHF History: 1 HTN History: 1 Diabetes History: 1 Stroke History: 0 Vascular Disease History: 0 Age Score: 2 Gender Score: 0   Underwent cardioversion 05/13/22.  CrCl 73mL/min Platelet count 119K  Per office protocol, patient can hold warfarin for up to 5 days prior to procedure. Patient will not need bridging with Lovenox around procedure.  **This guidance is not considered finalized until pre-operative APP has relayed final recommendations.**

## 2022-10-06 NOTE — Telephone Encounter (Signed)
   Pre-operative Risk Assessment    Patient Name: Carlos Gibson  DOB: [birth year] MRN: [id number]      Request for Surgical Clearance    Procedure:   Rezum BPH   Date of Surgery:  Clearance 12/14/22                                 Surgeon:  Dr. Modena Slater  Surgeon's Group or Practice Name:  [facility]  Phone number:  [phone] Fax number:  [phone]   Type of Clearance Requested:   - Pharmacy:  Hold Warfarin (Coumadin) 3-5 days prior to procedure    Type of Anesthesia:   Nitrous    Additional requests/questions:  Please advise surgeon/provider what medications should be held.  Signed, April Henson   10/06/2022, 2:49 PM

## 2022-10-06 NOTE — Telephone Encounter (Signed)
   Patient Name: Carlos Gibson  DOB: Dec 04, 1940 MRN: [id number]  Primary Cardiologist: Donato Schultz, MD  Clinical pharmacists have reviewed the patient's past medical history, labs, and current medications as part of preoperative protocol coverage. The following recommendations have been made:   Per office protocol, patient can hold warfarin for up to 5 days prior to procedure. Patient will not need bridging with Lovenox around procedure.    I will route this recommendation to the requesting party via Epic fax function and remove from pre-op pool.  Please call with questions.  Napoleon Form, Leodis Rains, NP 10/06/2022, 4:07 PM

## 2022-10-06 NOTE — Telephone Encounter (Signed)
Pharmacy please advise on holding Warfarin prior to Rezum BPH scheduled for 12/14/2022. Thank you.

## 2022-10-11 DIAGNOSIS — E1165 Type 2 diabetes mellitus with hyperglycemia: Secondary | ICD-10-CM | POA: Diagnosis not present

## 2022-10-11 DIAGNOSIS — R310 Gross hematuria: Secondary | ICD-10-CM | POA: Diagnosis not present

## 2022-10-11 DIAGNOSIS — E778 Other disorders of glycoprotein metabolism: Secondary | ICD-10-CM | POA: Diagnosis not present

## 2022-10-11 DIAGNOSIS — S3730XA Unspecified injury of urethra, initial encounter: Secondary | ICD-10-CM | POA: Diagnosis not present

## 2022-10-11 DIAGNOSIS — T83091A Other mechanical complication of indwelling urethral catheter, initial encounter: Secondary | ICD-10-CM | POA: Diagnosis not present

## 2022-10-11 DIAGNOSIS — N401 Enlarged prostate with lower urinary tract symptoms: Secondary | ICD-10-CM | POA: Diagnosis not present

## 2022-10-11 DIAGNOSIS — D696 Thrombocytopenia, unspecified: Secondary | ICD-10-CM | POA: Diagnosis not present

## 2022-10-11 DIAGNOSIS — E8809 Other disorders of plasma-protein metabolism, not elsewhere classified: Secondary | ICD-10-CM | POA: Diagnosis not present

## 2022-10-11 DIAGNOSIS — Z7901 Long term (current) use of anticoagulants: Secondary | ICD-10-CM | POA: Diagnosis not present

## 2022-10-11 DIAGNOSIS — X58XXXA Exposure to other specified factors, initial encounter: Secondary | ICD-10-CM | POA: Diagnosis not present

## 2022-10-11 DIAGNOSIS — T83098A Other mechanical complication of other indwelling urethral catheter, initial encounter: Secondary | ICD-10-CM | POA: Diagnosis not present

## 2022-10-11 DIAGNOSIS — R7989 Other specified abnormal findings of blood chemistry: Secondary | ICD-10-CM | POA: Diagnosis not present

## 2022-10-11 DIAGNOSIS — D649 Anemia, unspecified: Secondary | ICD-10-CM | POA: Diagnosis not present

## 2022-10-11 DIAGNOSIS — R338 Other retention of urine: Secondary | ICD-10-CM | POA: Diagnosis not present

## 2022-10-14 DIAGNOSIS — R338 Other retention of urine: Secondary | ICD-10-CM | POA: Diagnosis not present

## 2022-10-16 ENCOUNTER — Other Ambulatory Visit: Payer: Self-pay | Admitting: Cardiology

## 2022-10-21 NOTE — Progress Notes (Signed)
 Remote ICD transmission.   

## 2022-10-29 DIAGNOSIS — R338 Other retention of urine: Secondary | ICD-10-CM | POA: Diagnosis not present

## 2022-10-29 DIAGNOSIS — N401 Enlarged prostate with lower urinary tract symptoms: Secondary | ICD-10-CM | POA: Diagnosis not present

## 2022-11-02 ENCOUNTER — Ambulatory Visit (INDEPENDENT_AMBULATORY_CARE_PROVIDER_SITE_OTHER): Payer: Medicare Other

## 2022-11-02 DIAGNOSIS — I428 Other cardiomyopathies: Secondary | ICD-10-CM

## 2022-11-02 LAB — CUP PACEART REMOTE DEVICE CHECK
Battery Remaining Longevity: 4 mo
Battery Voltage: 2.81 V
Brady Statistic AP VP Percent: 98.79 %
Brady Statistic AP VS Percent: 0.15 %
Brady Statistic AS VP Percent: 1.05 %
Brady Statistic AS VS Percent: 0 %
Brady Statistic RA Percent Paced: 98.88 %
Brady Statistic RV Percent Paced: 99.78 %
Date Time Interrogation Session: 20240729033423
HighPow Impedance: 61 Ohm
Implantable Lead Connection Status: 753985
Implantable Lead Connection Status: 753985
Implantable Lead Connection Status: 753985
Implantable Lead Implant Date: 20191023
Implantable Lead Implant Date: 20191023
Implantable Lead Implant Date: 20191023
Implantable Lead Location: 753858
Implantable Lead Location: 753859
Implantable Lead Location: 753860
Implantable Lead Model: 4598
Implantable Lead Model: 5076
Implantable Lead Model: 6935
Implantable Pulse Generator Implant Date: 20191023
Lead Channel Impedance Value: 160.941
Lead Channel Impedance Value: 168.889
Lead Channel Impedance Value: 168.889
Lead Channel Impedance Value: 180 Ohm
Lead Channel Impedance Value: 180 Ohm
Lead Channel Impedance Value: 266 Ohm
Lead Channel Impedance Value: 304 Ohm
Lead Channel Impedance Value: 342 Ohm
Lead Channel Impedance Value: 342 Ohm
Lead Channel Impedance Value: 342 Ohm
Lead Channel Impedance Value: 380 Ohm
Lead Channel Impedance Value: 380 Ohm
Lead Channel Impedance Value: 456 Ohm
Lead Channel Impedance Value: 532 Ohm
Lead Channel Impedance Value: 532 Ohm
Lead Channel Impedance Value: 570 Ohm
Lead Channel Impedance Value: 589 Ohm
Lead Channel Impedance Value: 627 Ohm
Lead Channel Pacing Threshold Amplitude: 0.625 V
Lead Channel Pacing Threshold Amplitude: 1 V
Lead Channel Pacing Threshold Amplitude: 1 V
Lead Channel Pacing Threshold Pulse Width: 0.4 ms
Lead Channel Pacing Threshold Pulse Width: 0.4 ms
Lead Channel Pacing Threshold Pulse Width: 0.8 ms
Lead Channel Sensing Intrinsic Amplitude: 2.75 mV
Lead Channel Sensing Intrinsic Amplitude: 2.75 mV
Lead Channel Sensing Intrinsic Amplitude: 9.25 mV
Lead Channel Sensing Intrinsic Amplitude: 9.25 mV
Lead Channel Setting Pacing Amplitude: 1.5 V
Lead Channel Setting Pacing Amplitude: 2 V
Lead Channel Setting Pacing Amplitude: 3.25 V
Lead Channel Setting Pacing Pulse Width: 0.4 ms
Lead Channel Setting Pacing Pulse Width: 0.8 ms
Lead Channel Setting Sensing Sensitivity: 0.3 mV
Zone Setting Status: 755011
Zone Setting Status: 755011

## 2022-11-03 ENCOUNTER — Ambulatory Visit: Payer: Medicare Other | Attending: Internal Medicine | Admitting: *Deleted

## 2022-11-03 DIAGNOSIS — Z7901 Long term (current) use of anticoagulants: Secondary | ICD-10-CM | POA: Insufficient documentation

## 2022-11-03 DIAGNOSIS — I483 Typical atrial flutter: Secondary | ICD-10-CM | POA: Insufficient documentation

## 2022-11-03 LAB — POCT INR: INR: 1.6 — AB (ref 2.0–3.0)

## 2022-11-03 NOTE — Patient Instructions (Signed)
Description   Today take 1 tablet of warfarin then continue taking warfarin 1/2 tablet daily except for 1 tablet on Saturdays. Stay consistent with greens each week.  Recheck INR in 4 weeks (normally 6 weeks).  Coumadin Clinic [phone].

## 2022-11-13 DIAGNOSIS — R338 Other retention of urine: Secondary | ICD-10-CM | POA: Diagnosis not present

## 2022-11-16 ENCOUNTER — Other Ambulatory Visit: Payer: Self-pay | Admitting: Cardiology

## 2022-11-16 DIAGNOSIS — I4819 Other persistent atrial fibrillation: Secondary | ICD-10-CM

## 2022-11-17 NOTE — Telephone Encounter (Signed)
Refill request for warfarin:  Last INR was 1.6 on 11/03/22 Next INR due 12/01/22 LOV was 05/27/22  Refill approved.

## 2022-11-18 NOTE — Progress Notes (Signed)
Remote ICD transmission.   

## 2022-11-30 DIAGNOSIS — R338 Other retention of urine: Secondary | ICD-10-CM | POA: Diagnosis not present

## 2022-11-30 DIAGNOSIS — N401 Enlarged prostate with lower urinary tract symptoms: Secondary | ICD-10-CM | POA: Diagnosis not present

## 2022-12-01 ENCOUNTER — Ambulatory Visit: Payer: Medicare Other

## 2022-12-03 ENCOUNTER — Ambulatory Visit (INDEPENDENT_AMBULATORY_CARE_PROVIDER_SITE_OTHER): Payer: No Typology Code available for payment source

## 2022-12-03 DIAGNOSIS — I428 Other cardiomyopathies: Secondary | ICD-10-CM | POA: Diagnosis not present

## 2022-12-03 LAB — CUP PACEART REMOTE DEVICE CHECK
Battery Remaining Longevity: 4 mo
Battery Voltage: 2.8 V
Brady Statistic AP VP Percent: 99.57 %
Brady Statistic AP VS Percent: 0.17 %
Brady Statistic AS VP Percent: 0.26 %
Brady Statistic AS VS Percent: 0 %
Brady Statistic RA Percent Paced: 99.71 %
Brady Statistic RV Percent Paced: 99.77 %
Date Time Interrogation Session: 20240829033325
HighPow Impedance: 64 Ohm
Implantable Lead Connection Status: 753985
Implantable Lead Connection Status: 753985
Implantable Lead Connection Status: 753985
Implantable Lead Implant Date: 20191023
Implantable Lead Implant Date: 20191023
Implantable Lead Implant Date: 20191023
Implantable Lead Location: 753858
Implantable Lead Location: 753859
Implantable Lead Location: 753860
Implantable Lead Model: 4598
Implantable Lead Model: 5076
Implantable Lead Model: 6935
Implantable Pulse Generator Implant Date: 20191023
Lead Channel Impedance Value: 171 Ohm
Lead Channel Impedance Value: 180 Ohm
Lead Channel Impedance Value: 180 Ohm
Lead Channel Impedance Value: 184.154
Lead Channel Impedance Value: 184.154
Lead Channel Impedance Value: 304 Ohm
Lead Channel Impedance Value: 342 Ohm
Lead Channel Impedance Value: 342 Ohm
Lead Channel Impedance Value: 380 Ohm
Lead Channel Impedance Value: 380 Ohm
Lead Channel Impedance Value: 380 Ohm
Lead Channel Impedance Value: 399 Ohm
Lead Channel Impedance Value: 494 Ohm
Lead Channel Impedance Value: 589 Ohm
Lead Channel Impedance Value: 589 Ohm
Lead Channel Impedance Value: 589 Ohm
Lead Channel Impedance Value: 627 Ohm
Lead Channel Impedance Value: 627 Ohm
Lead Channel Pacing Threshold Amplitude: 0.625 V
Lead Channel Pacing Threshold Amplitude: 1 V
Lead Channel Pacing Threshold Amplitude: 1.375 V
Lead Channel Pacing Threshold Pulse Width: 0.4 ms
Lead Channel Pacing Threshold Pulse Width: 0.4 ms
Lead Channel Pacing Threshold Pulse Width: 0.8 ms
Lead Channel Sensing Intrinsic Amplitude: 3.25 mV
Lead Channel Sensing Intrinsic Amplitude: 3.25 mV
Lead Channel Sensing Intrinsic Amplitude: 9.125 mV
Lead Channel Sensing Intrinsic Amplitude: 9.125 mV
Lead Channel Setting Pacing Amplitude: 1.5 V
Lead Channel Setting Pacing Amplitude: 2.25 V
Lead Channel Setting Pacing Amplitude: 3.25 V
Lead Channel Setting Pacing Pulse Width: 0.4 ms
Lead Channel Setting Pacing Pulse Width: 0.8 ms
Lead Channel Setting Sensing Sensitivity: 0.3 mV
Zone Setting Status: 755011
Zone Setting Status: 755011

## 2022-12-08 ENCOUNTER — Ambulatory Visit: Payer: Medicare Other | Attending: Cardiology

## 2022-12-10 NOTE — Progress Notes (Signed)
Remote ICD transmission.   

## 2022-12-15 ENCOUNTER — Telehealth: Payer: Self-pay | Admitting: *Deleted

## 2022-12-15 NOTE — Telephone Encounter (Signed)
Called pt since he left a voicemail to call him back. There was no answer and his voicemail is full.   Returned call and spoke with pt. He inquired if his appt is today and advised he missed his appt last week. Rescheduled pt to 12/17/22 this week at 2pm and he confirmed read it back. He was thankful for the assistance.

## 2022-12-17 ENCOUNTER — Ambulatory Visit: Payer: Medicare Other | Attending: Internal Medicine | Admitting: *Deleted

## 2022-12-17 DIAGNOSIS — Z7901 Long term (current) use of anticoagulants: Secondary | ICD-10-CM | POA: Insufficient documentation

## 2022-12-17 DIAGNOSIS — I483 Typical atrial flutter: Secondary | ICD-10-CM | POA: Insufficient documentation

## 2022-12-17 LAB — POCT INR: INR: 1.6 — AB (ref 2.0–3.0)

## 2022-12-17 NOTE — Patient Instructions (Signed)
Description   Today take 1 tablet of warfarin then START taking warfarin 1/2 tablet daily except for 1 tablet on Tuesdays and Saturdays. Stay consistent with greens each week.  Recheck INR in 3 weeks (normally 6 weeks).  Coumadin Clinic [phone].

## 2023-01-08 ENCOUNTER — Ambulatory Visit: Payer: Medicare Other

## 2023-01-18 ENCOUNTER — Ambulatory Visit: Payer: Medicare Other | Attending: Cardiology | Admitting: *Deleted

## 2023-01-18 DIAGNOSIS — N2581 Secondary hyperparathyroidism of renal origin: Secondary | ICD-10-CM | POA: Diagnosis not present

## 2023-01-18 DIAGNOSIS — I483 Typical atrial flutter: Secondary | ICD-10-CM | POA: Insufficient documentation

## 2023-01-18 DIAGNOSIS — N189 Chronic kidney disease, unspecified: Secondary | ICD-10-CM | POA: Diagnosis not present

## 2023-01-18 DIAGNOSIS — Z7901 Long term (current) use of anticoagulants: Secondary | ICD-10-CM | POA: Diagnosis not present

## 2023-01-18 DIAGNOSIS — I5020 Unspecified systolic (congestive) heart failure: Secondary | ICD-10-CM | POA: Diagnosis not present

## 2023-01-18 DIAGNOSIS — I4891 Unspecified atrial fibrillation: Secondary | ICD-10-CM | POA: Diagnosis not present

## 2023-01-18 DIAGNOSIS — I129 Hypertensive chronic kidney disease with stage 1 through stage 4 chronic kidney disease, or unspecified chronic kidney disease: Secondary | ICD-10-CM | POA: Diagnosis not present

## 2023-01-18 DIAGNOSIS — E1122 Type 2 diabetes mellitus with diabetic chronic kidney disease: Secondary | ICD-10-CM | POA: Diagnosis not present

## 2023-01-18 DIAGNOSIS — D631 Anemia in chronic kidney disease: Secondary | ICD-10-CM | POA: Diagnosis not present

## 2023-01-18 DIAGNOSIS — N1832 Chronic kidney disease, stage 3b: Secondary | ICD-10-CM | POA: Diagnosis not present

## 2023-01-18 DIAGNOSIS — N400 Enlarged prostate without lower urinary tract symptoms: Secondary | ICD-10-CM | POA: Diagnosis not present

## 2023-01-18 LAB — POCT INR: INR: 1.9 — AB (ref 2.0–3.0)

## 2023-01-18 NOTE — Patient Instructions (Signed)
Description   Today take 1 tablet of warfarin then START taking warfarin 1/2 tablet daily except for 1 tablet on Tuesdays, Thursdays, and Saturdays. Stay consistent with greens each week.  Recheck INR in 3 weeks (normally 6 weeks).  Coumadin Clinic [phone].

## 2023-01-19 LAB — LAB REPORT - SCANNED: EGFR: 32

## 2023-01-25 DIAGNOSIS — R338 Other retention of urine: Secondary | ICD-10-CM | POA: Diagnosis not present

## 2023-01-25 DIAGNOSIS — N401 Enlarged prostate with lower urinary tract symptoms: Secondary | ICD-10-CM | POA: Diagnosis not present

## 2023-02-04 ENCOUNTER — Ambulatory Visit (INDEPENDENT_AMBULATORY_CARE_PROVIDER_SITE_OTHER): Payer: No Typology Code available for payment source

## 2023-02-04 DIAGNOSIS — I428 Other cardiomyopathies: Secondary | ICD-10-CM

## 2023-02-07 LAB — CUP PACEART REMOTE DEVICE CHECK
Battery Remaining Longevity: 2 mo
Battery Voltage: 2.76 V
Brady Statistic AP VP Percent: 97.95 %
Brady Statistic AP VS Percent: 0.13 %
Brady Statistic AS VP Percent: 1.9 %
Brady Statistic AS VS Percent: 0.01 %
Brady Statistic RA Percent Paced: 97.86 %
Brady Statistic RV Percent Paced: 99.62 %
Date Time Interrogation Session: 20241101125520
HighPow Impedance: 49 Ohm
Implantable Lead Connection Status: 753985
Implantable Lead Connection Status: 753985
Implantable Lead Connection Status: 753985
Implantable Lead Implant Date: 20191023
Implantable Lead Implant Date: 20191023
Implantable Lead Implant Date: 20191023
Implantable Lead Location: 753858
Implantable Lead Location: 753859
Implantable Lead Location: 753860
Implantable Lead Model: 4598
Implantable Lead Model: 5076
Implantable Lead Model: 6935
Implantable Pulse Generator Implant Date: 20191023
Lead Channel Impedance Value: 160.941
Lead Channel Impedance Value: 168.889
Lead Channel Impedance Value: 179.282
Lead Channel Impedance Value: 191.854
Lead Channel Impedance Value: 203.256
Lead Channel Impedance Value: 266 Ohm
Lead Channel Impedance Value: 304 Ohm
Lead Channel Impedance Value: 342 Ohm
Lead Channel Impedance Value: 342 Ohm
Lead Channel Impedance Value: 380 Ohm
Lead Channel Impedance Value: 380 Ohm
Lead Channel Impedance Value: 437 Ohm
Lead Channel Impedance Value: 456 Ohm
Lead Channel Impedance Value: 570 Ohm
Lead Channel Impedance Value: 570 Ohm
Lead Channel Impedance Value: 646 Ohm
Lead Channel Impedance Value: 665 Ohm
Lead Channel Impedance Value: 703 Ohm
Lead Channel Pacing Threshold Amplitude: 0.75 V
Lead Channel Pacing Threshold Amplitude: 1.125 V
Lead Channel Pacing Threshold Amplitude: 1.375 V
Lead Channel Pacing Threshold Pulse Width: 0.4 ms
Lead Channel Pacing Threshold Pulse Width: 0.4 ms
Lead Channel Pacing Threshold Pulse Width: 0.8 ms
Lead Channel Sensing Intrinsic Amplitude: 2.75 mV
Lead Channel Sensing Intrinsic Amplitude: 2.75 mV
Lead Channel Sensing Intrinsic Amplitude: 6.5 mV
Lead Channel Sensing Intrinsic Amplitude: 6.5 mV
Lead Channel Setting Pacing Amplitude: 1.5 V
Lead Channel Setting Pacing Amplitude: 2.25 V
Lead Channel Setting Pacing Amplitude: 3.25 V
Lead Channel Setting Pacing Pulse Width: 0.4 ms
Lead Channel Setting Pacing Pulse Width: 0.8 ms
Lead Channel Setting Sensing Sensitivity: 0.3 mV
Zone Setting Status: 755011
Zone Setting Status: 755011

## 2023-02-17 NOTE — Progress Notes (Signed)
Remote ICD transmission.   

## 2023-02-19 ENCOUNTER — Ambulatory Visit: Payer: Medicare Other

## 2023-02-26 ENCOUNTER — Ambulatory Visit: Payer: Medicare Other | Attending: Internal Medicine

## 2023-02-26 DIAGNOSIS — I483 Typical atrial flutter: Secondary | ICD-10-CM | POA: Insufficient documentation

## 2023-02-26 DIAGNOSIS — Z7901 Long term (current) use of anticoagulants: Secondary | ICD-10-CM | POA: Diagnosis not present

## 2023-02-26 LAB — POCT INR
INR: 3.5 — AB (ref 2.0–3.0)
INR: 3.5 — AB (ref 2.0–3.0)

## 2023-02-26 NOTE — Patient Instructions (Signed)
Description   Skip today's dosage of Warfarin, then resume same dosage of warfarin 1/2 tablet daily except for 1 tablet on Tuesdays, Thursdays, and Saturdays. Stay consistent with greens each week.  Recheck INR in 4 weeks (normally 6 weeks).  Coumadin Clinic [phone].

## 2023-03-01 ENCOUNTER — Ambulatory Visit: Payer: Medicare Other

## 2023-03-08 ENCOUNTER — Ambulatory Visit (INDEPENDENT_AMBULATORY_CARE_PROVIDER_SITE_OTHER): Payer: No Typology Code available for payment source

## 2023-03-08 DIAGNOSIS — I428 Other cardiomyopathies: Secondary | ICD-10-CM

## 2023-03-08 LAB — CUP PACEART REMOTE DEVICE CHECK
Battery Remaining Longevity: 2 mo
Battery Voltage: 2.74 V
Brady Statistic AP VP Percent: 97.47 %
Brady Statistic AP VS Percent: 0.29 %
Brady Statistic AS VP Percent: 2.23 %
Brady Statistic AS VS Percent: 0.01 %
Brady Statistic RA Percent Paced: 97.43 %
Brady Statistic RV Percent Paced: 99.44 %
Date Time Interrogation Session: 20241202193725
HighPow Impedance: 59 Ohm
Implantable Lead Connection Status: 753985
Implantable Lead Connection Status: 753985
Implantable Lead Connection Status: 753985
Implantable Lead Implant Date: 20191023
Implantable Lead Implant Date: 20191023
Implantable Lead Implant Date: 20191023
Implantable Lead Location: 753858
Implantable Lead Location: 753859
Implantable Lead Location: 753860
Implantable Lead Model: 4598
Implantable Lead Model: 5076
Implantable Lead Model: 6935
Implantable Pulse Generator Implant Date: 20191023
Lead Channel Impedance Value: 171 Ohm
Lead Channel Impedance Value: 184.154
Lead Channel Impedance Value: 184.154
Lead Channel Impedance Value: 184.154
Lead Channel Impedance Value: 184.154
Lead Channel Impedance Value: 304 Ohm
Lead Channel Impedance Value: 342 Ohm
Lead Channel Impedance Value: 342 Ohm
Lead Channel Impedance Value: 399 Ohm
Lead Channel Impedance Value: 399 Ohm
Lead Channel Impedance Value: 399 Ohm
Lead Channel Impedance Value: 399 Ohm
Lead Channel Impedance Value: 513 Ohm
Lead Channel Impedance Value: 627 Ohm
Lead Channel Impedance Value: 627 Ohm
Lead Channel Impedance Value: 627 Ohm
Lead Channel Impedance Value: 627 Ohm
Lead Channel Impedance Value: 646 Ohm
Lead Channel Pacing Threshold Amplitude: 0.75 V
Lead Channel Pacing Threshold Amplitude: 0.875 V
Lead Channel Pacing Threshold Amplitude: 1.5 V
Lead Channel Pacing Threshold Pulse Width: 0.4 ms
Lead Channel Pacing Threshold Pulse Width: 0.4 ms
Lead Channel Pacing Threshold Pulse Width: 0.8 ms
Lead Channel Sensing Intrinsic Amplitude: 2 mV
Lead Channel Sensing Intrinsic Amplitude: 2 mV
Lead Channel Sensing Intrinsic Amplitude: 7.375 mV
Lead Channel Sensing Intrinsic Amplitude: 7.375 mV
Lead Channel Setting Pacing Amplitude: 1.5 V
Lead Channel Setting Pacing Amplitude: 2 V
Lead Channel Setting Pacing Amplitude: 3.25 V
Lead Channel Setting Pacing Pulse Width: 0.4 ms
Lead Channel Setting Pacing Pulse Width: 0.8 ms
Lead Channel Setting Sensing Sensitivity: 0.3 mV
Zone Setting Status: 755011
Zone Setting Status: 755011

## 2023-03-12 DIAGNOSIS — Z23 Encounter for immunization: Secondary | ICD-10-CM | POA: Diagnosis not present

## 2023-03-24 ENCOUNTER — Telehealth: Payer: Self-pay

## 2023-03-24 NOTE — Telephone Encounter (Signed)
Outreach made to Pt.  Call went to VM.  Unable to leave message. Attempted to contact granddaughter EC-unable to contact. Attempted to contact wife per DPR-phone no longer in service.  Left message on appt notes for upcoming coumadin visit to get phone number to contact Pt.  Alert received from [employer]:  Device alert for RRT reached 12/18 - route to triage AFL in progress, controlled rates, burden 88%, Warfarin per PA report

## 2023-03-26 ENCOUNTER — Ambulatory Visit: Payer: Medicare Other | Attending: Cardiovascular Disease

## 2023-03-26 DIAGNOSIS — Z7901 Long term (current) use of anticoagulants: Secondary | ICD-10-CM | POA: Diagnosis not present

## 2023-03-26 DIAGNOSIS — I483 Typical atrial flutter: Secondary | ICD-10-CM | POA: Insufficient documentation

## 2023-03-26 LAB — POCT INR: INR: 3.2 — AB (ref 2.0–3.0)

## 2023-03-26 NOTE — Patient Instructions (Signed)
Continue 1/2 tablet daily except for 1 tablet on Tuesdays, Thursdays, and Saturdays. Stay consistent with greens each week.  Recheck INR in 4 weeks (normally 6 weeks).  Coumadin Clinic [phone].

## 2023-03-26 NOTE — Telephone Encounter (Signed)
Spoke to patient about ICD @ RRT. Advised pt need for apt to discuss gen change. Pt voiced understanding. May call pt at [phone] .

## 2023-04-08 ENCOUNTER — Ambulatory Visit (INDEPENDENT_AMBULATORY_CARE_PROVIDER_SITE_OTHER): Payer: No Typology Code available for payment source

## 2023-04-08 DIAGNOSIS — I472 Ventricular tachycardia, unspecified: Secondary | ICD-10-CM

## 2023-04-08 LAB — CUP PACEART REMOTE DEVICE CHECK
Battery Remaining Longevity: 1 mo — CL
Battery Voltage: 2.71 V
Brady Statistic RA Percent Paced: 7.24 %
Brady Statistic RV Percent Paced: 99.23 %
Date Time Interrogation Session: 20250102012404
HighPow Impedance: 59 Ohm
Implantable Lead Connection Status: 753985
Implantable Lead Connection Status: 753985
Implantable Lead Connection Status: 753985
Implantable Lead Implant Date: 20191023
Implantable Lead Implant Date: 20191023
Implantable Lead Implant Date: 20191023
Implantable Lead Location: 753858
Implantable Lead Location: 753859
Implantable Lead Location: 753860
Implantable Lead Model: 4598
Implantable Lead Model: 5076
Implantable Lead Model: 6935
Implantable Pulse Generator Implant Date: 20191023
Lead Channel Impedance Value: 171 Ohm
Lead Channel Impedance Value: 180 Ohm
Lead Channel Impedance Value: 180 Ohm
Lead Channel Impedance Value: 180 Ohm
Lead Channel Impedance Value: 190 Ohm
Lead Channel Impedance Value: 304 Ohm
Lead Channel Impedance Value: 342 Ohm
Lead Channel Impedance Value: 342 Ohm
Lead Channel Impedance Value: 380 Ohm
Lead Channel Impedance Value: 380 Ohm
Lead Channel Impedance Value: 380 Ohm
Lead Channel Impedance Value: 399 Ohm
Lead Channel Impedance Value: 494 Ohm
Lead Channel Impedance Value: 589 Ohm
Lead Channel Impedance Value: 589 Ohm
Lead Channel Impedance Value: 589 Ohm
Lead Channel Impedance Value: 627 Ohm
Lead Channel Impedance Value: 646 Ohm
Lead Channel Pacing Threshold Amplitude: 0.75 V
Lead Channel Pacing Threshold Amplitude: 0.875 V
Lead Channel Pacing Threshold Amplitude: 1.375 V
Lead Channel Pacing Threshold Pulse Width: 0.4 ms
Lead Channel Pacing Threshold Pulse Width: 0.4 ms
Lead Channel Pacing Threshold Pulse Width: 0.8 ms
Lead Channel Sensing Intrinsic Amplitude: 2.125 mV
Lead Channel Sensing Intrinsic Amplitude: 2.125 mV
Lead Channel Sensing Intrinsic Amplitude: 6 mV
Lead Channel Sensing Intrinsic Amplitude: 6 mV
Lead Channel Setting Pacing Amplitude: 1.5 V
Lead Channel Setting Pacing Amplitude: 2 V
Lead Channel Setting Pacing Amplitude: 3.25 V
Lead Channel Setting Pacing Pulse Width: 0.4 ms
Lead Channel Setting Pacing Pulse Width: 0.8 ms
Lead Channel Setting Sensing Sensitivity: 0.3 mV
Zone Setting Status: 755011
Zone Setting Status: 755011

## 2023-04-11 NOTE — Progress Notes (Signed)
 Cardiology Office Note:  .   Date:  04/11/2023  ID:  Carlos Gibson, DOB 1940-10-25, MRN [id number] PCP: Merlene Laughter, MD (Inactive)  [facility] Providers Cardiologist:  Donato Schultz, MD Cardiology APP:  Azalee Course, [place]  Electrophysiologist:  Will Jorja Loa, MD {  History of Present Illness: .   Carlos Gibson is a 83 y.o. male w/PMHx of NICM, Chronic CHF (systolic), PVCs, conduction system disease (trifascicular block), CKD (III), Gilbert's syndrome, ICD, Afib/flutter, DM  He saw Dr. Elberta Fortis 04/23/22, doing OK, some DOE/exertional fatigue, not new.  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 No bleeding or signs of bleeding  He is back in AFlutter, unaware (paced)   Device information MDT CRT-D, implanted 04/28/2017  Arrhythmia/AAD hx AFib looks to go back to 2020 Amiodarone started 2020  Studies Reviewed: [name]    EKG done today and reviewed by myself:  AFlutter/bive paced 84bpm, PVC  DEVICE interrogation done today and reviewed by myself RRT reached 03/24/23 Lead measurements ar good AF burden reported 9.9% Appears he has been in AFib since early December No VT Underlying AFlutter, V rate right about 40   TTE 11/06/21   1. Akinesis of the inferior and inferolateral walls and hypokinesis of  remaining walls; overall severe LV dysfunction.   2. Left ventricular ejection fraction, by estimation, is 25 to 30%. The  left ventricle has severely decreased function. The left ventricle  demonstrates regional wall motion abnormalities (see scoring  diagram/findings for description). The left  ventricular internal cavity size  was severely dilated. Left ventricular  diastolic parameters are consistent with Grade II diastolic dysfunction  (pseudonormalization). Elevated left atrial pressure.   3. Right ventricular systolic function is mildly reduced. The right  ventricular size is mildly enlarged.   4. Left atrial size was severely dilated.   5. Right atrial size was moderately dilated.   6. A small pericardial effusion is present.   7. The mitral valve is normal in structure. Severe mitral valve  regurgitation. No evidence of mitral stenosis.   8. Tricuspid valve regurgitation is moderate.   9. The aortic valve is tricuspid. Aortic valve regurgitation is moderate  to severe. No aortic stenosis is present.  10. Aortic dilatation noted. There is borderline dilatation of the aortic  root, measuring 39 mm.  11. The inferior vena cava is normal in size with greater than 50%  respiratory variability, suggesting right atrial pressure of 3 mmHg.      Risk Assessment/Calculations:    Physical Exam:   VS:  There were no vitals taken for this visit.   Wt Readings from Last 3 Encounters:  05/27/22 177 lb 6.4 oz (80.5 kg)  05/13/22 187 lb (84.8 kg)  04/23/22 181 lb (82.1 kg)    GEN: Well nourished, well developed in no acute distress NECK: No JVD; No carotid bruits CARDIAC: RRR, no murmurs, rubs, gallops RESPIRATORY:  CTA b/l without rales, wheezing or rhonchi  ABDOMEN: Soft, non-tender, non-distended EXTREMITIES:  No edema; No deformity   ICD site: is stable, no thinning, fluctuation, tethering  ASSESSMENT AND PLAN: .    CRT-D RRT on 03/24/23  Recommend  replacing w/same > CRT-D despite advanced age, very active, still working Discussed generator replacement procedure, potential risks/benefits, he would like to proceed  Will make warfain recs pending lab result Scheduled for the 13th  NICM Chronic CHF Volume stable by exam and OptiVol On BB, diuretics, nitrate, hydralazine  Cough w/ACE C/w Dr.  Lalla Brothers   persistent AFib/flutter CHA2DS2Vasc is 4, on warfarin, managed by his PMD Back in AFlutter about a month Ideally INR could stay therapeutic, though will plan perhaps DCCV once pocket is healed up and no potential need to hold his warfarin for any length of time.   Secondary hypercoagulable state 2/2 AFib     Dispo: usual post procedure f/u  Signed, Sheilah Pigeon, PA-C

## 2023-04-11 NOTE — H&P (View-Only) (Signed)
Cardiology Office Note:  .   Date:  04/11/2023  ID:  Carlos Gibson, DOB 1940-10-25, MRN [id number] PCP: Merlene Laughter, MD (Inactive)  [facility] Providers Cardiologist:  Donato Schultz, MD Cardiology APP:  Azalee Course, [place]  Electrophysiologist:  Will Jorja Loa, MD {  History of Present Illness: .   Carlos Gibson is a 83 y.o. male w/PMHx of NICM, Chronic CHF (systolic), PVCs, conduction system disease (trifascicular block), CKD (III), Gilbert's syndrome, ICD, Afib/flutter, DM  He saw Dr. Elberta Fortis 04/23/22, doing OK, some DOE/exertional fatigue, not new.  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 No bleeding or signs of bleeding  He is back in AFlutter, unaware (paced)   Device information MDT CRT-D, implanted 04/28/2017  Arrhythmia/AAD hx AFib looks to go back to 2020 Amiodarone started 2020  Studies Reviewed: [name]    EKG done today and reviewed by myself:  AFlutter/bive paced 84bpm, PVC  DEVICE interrogation done today and reviewed by myself RRT reached 03/24/23 Lead measurements ar good AF burden reported 9.9% Appears he has been in AFib since early December No VT Underlying AFlutter, V rate right about 40   TTE 11/06/21   1. Akinesis of the inferior and inferolateral walls and hypokinesis of  remaining walls; overall severe LV dysfunction.   2. Left ventricular ejection fraction, by estimation, is 25 to 30%. The  left ventricle has severely decreased function. The left ventricle  demonstrates regional wall motion abnormalities (see scoring  diagram/findings for description). The left  ventricular internal cavity size  was severely dilated. Left ventricular  diastolic parameters are consistent with Grade II diastolic dysfunction  (pseudonormalization). Elevated left atrial pressure.   3. Right ventricular systolic function is mildly reduced. The right  ventricular size is mildly enlarged.   4. Left atrial size was severely dilated.   5. Right atrial size was moderately dilated.   6. A small pericardial effusion is present.   7. The mitral valve is normal in structure. Severe mitral valve  regurgitation. No evidence of mitral stenosis.   8. Tricuspid valve regurgitation is moderate.   9. The aortic valve is tricuspid. Aortic valve regurgitation is moderate  to severe. No aortic stenosis is present.  10. Aortic dilatation noted. There is borderline dilatation of the aortic  root, measuring 39 mm.  11. The inferior vena cava is normal in size with greater than 50%  respiratory variability, suggesting right atrial pressure of 3 mmHg.      Risk Assessment/Calculations:    Physical Exam:   VS:  There were no vitals taken for this visit.   Wt Readings from Last 3 Encounters:  05/27/22 177 lb 6.4 oz (80.5 kg)  05/13/22 187 lb (84.8 kg)  04/23/22 181 lb (82.1 kg)    GEN: Well nourished, well developed in no acute distress NECK: No JVD; No carotid bruits CARDIAC: RRR, no murmurs, rubs, gallops RESPIRATORY:  CTA b/l without rales, wheezing or rhonchi  ABDOMEN: Soft, non-tender, non-distended EXTREMITIES:  No edema; No deformity   ICD site: is stable, no thinning, fluctuation, tethering  ASSESSMENT AND PLAN: .    CRT-D RRT on 03/24/23  Recommend  replacing w/same > CRT-D despite advanced age, very active, still working Discussed generator replacement procedure, potential risks/benefits, he would like to proceed  Will make warfain recs pending lab result Scheduled for the 13th  NICM Chronic CHF Volume stable by exam and OptiVol On BB, diuretics, nitrate, hydralazine  Cough w/ACE C/w Dr.  Lalla Brothers   persistent AFib/flutter CHA2DS2Vasc is 4, on warfarin, managed by his PMD Back in AFlutter about a month Ideally INR could stay therapeutic, though will plan perhaps DCCV once pocket is healed up and no potential need to hold his warfarin for any length of time.   Secondary hypercoagulable state 2/2 AFib     Dispo: usual post procedure f/u  Signed, Sheilah Pigeon, PA-C

## 2023-04-12 ENCOUNTER — Encounter: Payer: Self-pay | Admitting: Physician Assistant

## 2023-04-12 ENCOUNTER — Ambulatory Visit: Payer: Medicare Other | Attending: Physician Assistant | Admitting: Physician Assistant

## 2023-04-12 VITALS — BP 112/68 | HR 84 | Ht 71.0 in | Wt 171.6 lb

## 2023-04-12 DIAGNOSIS — I483 Typical atrial flutter: Secondary | ICD-10-CM | POA: Diagnosis not present

## 2023-04-12 DIAGNOSIS — D6869 Other thrombophilia: Secondary | ICD-10-CM | POA: Insufficient documentation

## 2023-04-12 DIAGNOSIS — Z95810 Presence of automatic (implantable) cardiac defibrillator: Secondary | ICD-10-CM | POA: Diagnosis not present

## 2023-04-12 DIAGNOSIS — I4892 Unspecified atrial flutter: Secondary | ICD-10-CM | POA: Diagnosis not present

## 2023-04-12 DIAGNOSIS — I5022 Chronic systolic (congestive) heart failure: Secondary | ICD-10-CM | POA: Insufficient documentation

## 2023-04-12 DIAGNOSIS — I428 Other cardiomyopathies: Secondary | ICD-10-CM | POA: Diagnosis not present

## 2023-04-12 DIAGNOSIS — I4819 Other persistent atrial fibrillation: Secondary | ICD-10-CM | POA: Insufficient documentation

## 2023-04-12 LAB — CUP PACEART INCLINIC DEVICE CHECK
Battery Remaining Longevity: 1 mo — CL
Battery Voltage: 2.7 V
Brady Statistic AP VP Percent: 88.41 %
Brady Statistic AP VS Percent: 0.14 %
Brady Statistic AS VP Percent: 11.34 %
Brady Statistic AS VS Percent: 0.12 %
Brady Statistic RA Percent Paced: 88.02 %
Brady Statistic RV Percent Paced: 99.54 %
Date Time Interrogation Session: 20250106180658
HighPow Impedance: 62 Ohm
Implantable Lead Connection Status: 753985
Implantable Lead Connection Status: 753985
Implantable Lead Connection Status: 753985
Implantable Lead Implant Date: 20191023
Implantable Lead Implant Date: 20191023
Implantable Lead Implant Date: 20191023
Implantable Lead Location: 753858
Implantable Lead Location: 753859
Implantable Lead Location: 753860
Implantable Lead Model: 4598
Implantable Lead Model: 5076
Implantable Lead Model: 6935
Implantable Pulse Generator Implant Date: 20191023
Lead Channel Impedance Value: 160.941
Lead Channel Impedance Value: 168.889
Lead Channel Impedance Value: 172.541
Lead Channel Impedance Value: 184.154
Lead Channel Impedance Value: 194.634
Lead Channel Impedance Value: 304 Ohm
Lead Channel Impedance Value: 323 Ohm
Lead Channel Impedance Value: 342 Ohm
Lead Channel Impedance Value: 380 Ohm
Lead Channel Impedance Value: 399 Ohm
Lead Channel Impedance Value: 399 Ohm
Lead Channel Impedance Value: 437 Ohm
Lead Channel Impedance Value: 494 Ohm
Lead Channel Impedance Value: 589 Ohm
Lead Channel Impedance Value: 627 Ohm
Lead Channel Impedance Value: 627 Ohm
Lead Channel Impedance Value: 646 Ohm
Lead Channel Impedance Value: 665 Ohm
Lead Channel Pacing Threshold Amplitude: 0.75 V
Lead Channel Pacing Threshold Amplitude: 0.875 V
Lead Channel Pacing Threshold Amplitude: 1.5 V
Lead Channel Pacing Threshold Pulse Width: 0.4 ms
Lead Channel Pacing Threshold Pulse Width: 0.4 ms
Lead Channel Pacing Threshold Pulse Width: 0.8 ms
Lead Channel Sensing Intrinsic Amplitude: 10.25 mV
Lead Channel Sensing Intrinsic Amplitude: 2.25 mV
Lead Channel Sensing Intrinsic Amplitude: 3.25 mV
Lead Channel Sensing Intrinsic Amplitude: 6 mV
Lead Channel Setting Pacing Amplitude: 1.5 V
Lead Channel Setting Pacing Amplitude: 2 V
Lead Channel Setting Pacing Amplitude: 3.25 V
Lead Channel Setting Pacing Pulse Width: 0.4 ms
Lead Channel Setting Pacing Pulse Width: 0.8 ms
Lead Channel Setting Sensing Sensitivity: 0.3 mV
Zone Setting Status: 755011
Zone Setting Status: 755011

## 2023-04-12 NOTE — Patient Instructions (Addendum)
 Medication Instructions:   Your physician recommends that you continue on your current medications as directed. Please refer to the Current Medication list given to you today.   *If you need a refill on your cardiac medications before your next appointment, please call your pharmacy*   Lab Work:   PLEASE GO DOWN STAIRS FIRST FLOOR  [address] :   BMET CBC AND PT INR TODAY      If you have labs (blood work) drawn today and your tests are completely normal, you will receive your results only by: MyChart Message (if you have MyChart) OR A paper copy in the mail If you have any lab test that is abnormal or we need to change your treatment, we will call you to review the results.   Testing/Procedures:  SEE LETTER BELOW FOR  DEVICE CHANGE LETTER      Follow-Up: At [facility], you and your health needs are our priority.  As part of our continuing mission to provide you with exceptional heart care, we have created designated Provider Care Teams.  These Care Teams include your primary Cardiologist (physician) and Advanced Practice Providers (APPs -  Physician Assistants and Nurse Practitioners) who all work together to provide you with the care you need, when you need it.  We recommend signing up for the patient portal called MyChart.  Sign up information is provided on this After Visit Summary.  MyChart is used to connect with patients for Virtual Visits (Telemedicine).  Patients are able to view lab/test results, encounter notes, upcoming appointments, etc.  Non-urgent messages can be sent to your provider as well.   To learn more about what you can do with MyChart, go to [email or web address].    Your next appointment:  AFTER   04-19-23  : 10-14 DAY WOUND CHECK DEVICE CLINIC   3 month(s)  Provider:   You may see Will Gladis Norton, MD  PHYS DEFIB [facility]    Other Instructions      Implantable Device Instructions    Carlos Gibson 04/12/2023  You are scheduled for a  PPM generator change on Monday, January 13 with Dr. Soyla Norton.  1. Pre procedure Lab testing:  [address] FIRST FLOOR [address]  FOR LABS : CMET CBC PT INR     2. Please arrive at the [facility] (Main Entrance A) at [facility]: [address] [place], [place] [address] at 3:00 PM (This time is 2 hour(s) before your procedure to ensure your preparation).   Free valet parking service is available. You will check in at ADMITTING. The support person will be asked to wait in the waiting room.  It is OK to have someone drop you off and come back when you are ready to be discharged.          Special note: Every effort is made to have your procedure done on time. Please understand that emergencies sometimes delay  scheduled procedures.  3.  No eating or drinking after midnight prior to procedure.     4.  Medication instructions:  On the morning of your procedure, NOTHING TO EAT  ONLY YOU MAY SIP WATER WITH MORNING MEDICINES: YOU WILL BE CONTACT BACK ON BLOOD THINNER INSTRUCTIONS.     5.  The night before your procedure and the morning of your procedure scrub your neck/chest with CHG surgical scrub. See instruction letter.  6. Plan to go home the same day, you will only stay overnight  if medically necessary. 7. You MUST have a responsible adult to drive you home. 8. An adult MUST be with you the first 24 hours after you arrive home. 9. Bring a current list of your medications, and the last time and date medication taken. 10. Bring ID and current insurance cards. 11. Please wear clothes that are easy to get on and off and wear slip-on shoes.    You will follow up with the [facility] Device clinic 10-14 days after your procedure.  You will follow up with Dr. Soyla Norton 91 days after your procedure.  These appointments will be made for you.   * If you have ANY questions after you get home, please call the office at [phone] or send a MyChart message.  FYI: For your safety,  and to allow us  to monitor your vital signs accurately during the surgery/procedure we request that if you have artificial nails, gel coating, SNS etc. Please have those removed prior to your surgery/procedure. Not having the nail coverings /polish removed may result in cancellation or delay of your surgery/procedure.    [facility] - Preparing For Surgery    Before surgery, you can play an important role. Because skin is not sterile, your skin needs to be as free of germs as possible. You can reduce the number of germs on your skin by washing with CHG (chlorahexidine gluconate) Soap before surgery.  CHG is an antiseptic cleaner which kills germs and bonds with the skin to continue killing germs even after washing.  Please do not use if you have an allergy to CHG or antibacterial soaps.  If your skin becomes reddened/irritated stop using the CHG.   Do not shave (including legs and underarms) for at least 48 hours prior to first CHG shower.  It is OK to shave your face.  Please follow these instructions carefully:  1.  Shower the night before surgery and the morning of surgery with CHG.  2.  If you choose to wash your hair, wash your hair first as usual with your normal shampoo.  3.  After you shampoo, rinse your hair and body thoroughly to remove the shampoo.  4.  Use CHG as you would any other liquid soap.  You can apply CHG directly to the skin and wash gently with a clean washcloth. 5.  Apply the CHG Soap to your body ONLY FROM THE NECK DOWN.  Do not use on open wounds or open sores.  Avoid contact with your eyes, ears, mouth and genitals (private parts).  Wash genitals (private parts) with your normal soap.  6.  Wash thoroughly, paying special attention to the area where your surgery will be performed.  7.  Thoroughly rinse your body with warm water from the neck down.   8.  DO NOT shower/wash with your normal soap after using and rinsing off the CHG soap.  9.  Pat yourself dry with a clean  towel.           10.  Wear clean pajamas.           11.  Place clean sheets on your bed the night of your first shower and do not sleep with pets.  Day of Surgery: Do not apply any deodorants/lotions.  Please wear clean clothes to the hospital/surgery center.

## 2023-04-13 LAB — CBC
Hematocrit: 41.5 % (ref 37.5–51.0)
Hemoglobin: 13.3 g/dL (ref 13.0–17.7)
MCH: 28.5 pg (ref 26.6–33.0)
MCHC: 32 g/dL (ref 31.5–35.7)
MCV: 89 fL (ref 79–97)
Platelets: 144 10*3/uL — ABNORMAL LOW (ref 150–450)
RBC: 4.67 x10E6/uL (ref 4.14–5.80)
RDW: 13.8 % (ref 11.6–15.4)
WBC: 3.9 10*3/uL (ref 3.4–10.8)

## 2023-04-13 LAB — COMPREHENSIVE METABOLIC PANEL
ALT: 16 [IU]/L (ref 0–44)
AST: 16 [IU]/L (ref 0–40)
Albumin: 3.7 g/dL (ref 3.7–4.7)
Alkaline Phosphatase: 89 [IU]/L (ref 44–121)
BUN/Creatinine Ratio: 10 (ref 10–24)
BUN: 24 mg/dL (ref 8–27)
Bilirubin Total: 1.5 mg/dL — ABNORMAL HIGH (ref 0.0–1.2)
CO2: 24 mmol/L (ref 20–29)
Calcium: 8.9 mg/dL (ref 8.6–10.2)
Chloride: 108 mmol/L — ABNORMAL HIGH (ref 96–106)
Creatinine, Ser: 2.33 mg/dL — ABNORMAL HIGH (ref 0.76–1.27)
Globulin, Total: 2.8 g/dL (ref 1.5–4.5)
Glucose: 110 mg/dL — ABNORMAL HIGH (ref 70–99)
Potassium: 4.3 mmol/L (ref 3.5–5.2)
Sodium: 143 mmol/L (ref 134–144)
Total Protein: 6.5 g/dL (ref 6.0–8.5)
eGFR: 27 mL/min/{1.73_m2} — ABNORMAL LOW (ref >59)

## 2023-04-13 LAB — PROTIME-INR
INR: 2.2 — ABNORMAL HIGH (ref 0.9–1.2)
Prothrombin Time: 23.4 s — ABNORMAL HIGH (ref 9.1–12.0)

## 2023-04-14 ENCOUNTER — Encounter: Payer: Self-pay | Admitting: General Practice

## 2023-04-14 ENCOUNTER — Telehealth: Payer: Self-pay | Admitting: *Deleted

## 2023-04-14 NOTE — Telephone Encounter (Signed)
 Spoke with patient who is aware of his procedure rescheduled date 04-29-23 to arrive at 1:30 pm. Patient aware all instruction with medications and scrub are the same a told to him.

## 2023-04-19 DIAGNOSIS — N1832 Chronic kidney disease, stage 3b: Secondary | ICD-10-CM

## 2023-04-19 DIAGNOSIS — Z01810 Encounter for preprocedural cardiovascular examination: Secondary | ICD-10-CM

## 2023-04-19 DIAGNOSIS — Z79899 Other long term (current) drug therapy: Secondary | ICD-10-CM

## 2023-04-22 DIAGNOSIS — N1832 Chronic kidney disease, stage 3b: Secondary | ICD-10-CM | POA: Diagnosis not present

## 2023-04-22 DIAGNOSIS — Z23 Encounter for immunization: Secondary | ICD-10-CM | POA: Diagnosis not present

## 2023-04-22 DIAGNOSIS — N2581 Secondary hyperparathyroidism of renal origin: Secondary | ICD-10-CM | POA: Diagnosis not present

## 2023-04-22 DIAGNOSIS — I129 Hypertensive chronic kidney disease with stage 1 through stage 4 chronic kidney disease, or unspecified chronic kidney disease: Secondary | ICD-10-CM | POA: Diagnosis not present

## 2023-04-22 DIAGNOSIS — E559 Vitamin D deficiency, unspecified: Secondary | ICD-10-CM | POA: Diagnosis not present

## 2023-04-22 DIAGNOSIS — I351 Nonrheumatic aortic (valve) insufficiency: Secondary | ICD-10-CM | POA: Diagnosis not present

## 2023-04-22 DIAGNOSIS — D696 Thrombocytopenia, unspecified: Secondary | ICD-10-CM | POA: Diagnosis not present

## 2023-04-22 DIAGNOSIS — I5020 Unspecified systolic (congestive) heart failure: Secondary | ICD-10-CM | POA: Diagnosis not present

## 2023-04-22 DIAGNOSIS — E7800 Pure hypercholesterolemia, unspecified: Secondary | ICD-10-CM | POA: Diagnosis not present

## 2023-04-22 DIAGNOSIS — Z1331 Encounter for screening for depression: Secondary | ICD-10-CM | POA: Diagnosis not present

## 2023-04-22 DIAGNOSIS — D6869 Other thrombophilia: Secondary | ICD-10-CM | POA: Diagnosis not present

## 2023-04-22 DIAGNOSIS — Z0000 Encounter for general adult medical examination without abnormal findings: Secondary | ICD-10-CM | POA: Diagnosis not present

## 2023-04-22 DIAGNOSIS — E1121 Type 2 diabetes mellitus with diabetic nephropathy: Secondary | ICD-10-CM | POA: Diagnosis not present

## 2023-04-22 DIAGNOSIS — I483 Typical atrial flutter: Secondary | ICD-10-CM | POA: Diagnosis not present

## 2023-04-23 ENCOUNTER — Ambulatory Visit: Payer: Medicare Other | Attending: Cardiovascular Disease | Admitting: *Deleted

## 2023-04-23 DIAGNOSIS — I483 Typical atrial flutter: Secondary | ICD-10-CM | POA: Diagnosis not present

## 2023-04-23 DIAGNOSIS — Z7901 Long term (current) use of anticoagulants: Secondary | ICD-10-CM

## 2023-04-23 LAB — POCT INR: INR: 2.9 (ref 2.0–3.0)

## 2023-04-23 NOTE — Patient Instructions (Signed)
Description   Continue taking warfarin 1/2 tablet daily except for 1 tablet on Tuesdays, Thursdays, and Saturdays. Stay consistent with greens each week.  Recheck INR in 1 week post poricedure (normally 6 weeks).  Coumadin Clinic [phone].

## 2023-04-27 ENCOUNTER — Telehealth: Payer: Self-pay | Admitting: *Deleted

## 2023-04-27 NOTE — Telephone Encounter (Signed)
Spoke with patient per asking for someone to help him remember his medications steps. Patient aware today warfarin dose will be the last dose  until after procedure 04-29-23 and remaining medicines with a sip of water morning of procedure.

## 2023-04-28 DIAGNOSIS — N401 Enlarged prostate with lower urinary tract symptoms: Secondary | ICD-10-CM | POA: Diagnosis not present

## 2023-04-28 DIAGNOSIS — R3912 Poor urinary stream: Secondary | ICD-10-CM | POA: Diagnosis not present

## 2023-04-28 DIAGNOSIS — R351 Nocturia: Secondary | ICD-10-CM | POA: Diagnosis not present

## 2023-04-28 NOTE — Pre-Procedure Instructions (Signed)
Instructed patient on the following items: Arrival time 1:00 Nothing to eat or drink after midnight No meds AM of procedure Responsible person to drive you home and stay with you for 24 hrs Wash with special soap night before and morning of procedure

## 2023-04-29 ENCOUNTER — Other Ambulatory Visit: Payer: Self-pay

## 2023-04-29 ENCOUNTER — Encounter (HOSPITAL_COMMUNITY)
Admission: RE | Disposition: A | Discharge status: Home / Self Care | Payer: Self-pay | Source: Home / Self Care | Attending: Cardiology

## 2023-04-29 ENCOUNTER — Encounter (HOSPITAL_COMMUNITY): Payer: Self-pay | Admitting: Cardiology

## 2023-04-29 ENCOUNTER — Ambulatory Visit (HOSPITAL_COMMUNITY)
Admission: RE | Admit: 2023-04-29 | Discharge: 2023-04-29 | Disposition: A | Discharge status: Home / Self Care | Payer: Medicare Other | Attending: Cardiology | Admitting: Cardiology

## 2023-04-29 ENCOUNTER — Ambulatory Visit: Payer: Medicare Other

## 2023-04-29 DIAGNOSIS — I447 Left bundle-branch block, unspecified: Secondary | ICD-10-CM | POA: Insufficient documentation

## 2023-04-29 DIAGNOSIS — D6869 Other thrombophilia: Secondary | ICD-10-CM | POA: Diagnosis not present

## 2023-04-29 DIAGNOSIS — I429 Cardiomyopathy, unspecified: Secondary | ICD-10-CM

## 2023-04-29 DIAGNOSIS — Z4502 Encounter for adjustment and management of automatic implantable cardiac defibrillator: Secondary | ICD-10-CM | POA: Insufficient documentation

## 2023-04-29 DIAGNOSIS — I4892 Unspecified atrial flutter: Secondary | ICD-10-CM | POA: Diagnosis not present

## 2023-04-29 DIAGNOSIS — N1832 Chronic kidney disease, stage 3b: Secondary | ICD-10-CM

## 2023-04-29 DIAGNOSIS — Z7901 Long term (current) use of anticoagulants: Secondary | ICD-10-CM | POA: Insufficient documentation

## 2023-04-29 DIAGNOSIS — I2510 Atherosclerotic heart disease of native coronary artery without angina pectoris: Secondary | ICD-10-CM | POA: Insufficient documentation

## 2023-04-29 DIAGNOSIS — I5022 Chronic systolic (congestive) heart failure: Secondary | ICD-10-CM | POA: Diagnosis not present

## 2023-04-29 DIAGNOSIS — Z79899 Other long term (current) drug therapy: Secondary | ICD-10-CM | POA: Diagnosis not present

## 2023-04-29 DIAGNOSIS — E1122 Type 2 diabetes mellitus with diabetic chronic kidney disease: Secondary | ICD-10-CM | POA: Insufficient documentation

## 2023-04-29 DIAGNOSIS — N183 Chronic kidney disease, stage 3 unspecified: Secondary | ICD-10-CM | POA: Insufficient documentation

## 2023-04-29 DIAGNOSIS — I453 Trifascicular block: Secondary | ICD-10-CM | POA: Insufficient documentation

## 2023-04-29 DIAGNOSIS — I428 Other cardiomyopathies: Secondary | ICD-10-CM | POA: Insufficient documentation

## 2023-04-29 DIAGNOSIS — Z01810 Encounter for preprocedural cardiovascular examination: Secondary | ICD-10-CM

## 2023-04-29 DIAGNOSIS — I4819 Other persistent atrial fibrillation: Secondary | ICD-10-CM | POA: Diagnosis not present

## 2023-04-29 HISTORY — PX: BIV ICD GENERATOR CHANGEOUT: EP1194

## 2023-04-29 LAB — BASIC METABOLIC PANEL
Anion gap: 11 (ref 5–15)
BUN: 25 mg/dL — ABNORMAL HIGH (ref 8–23)
CO2: 25 mmol/L (ref 22–32)
Calcium: 9.5 mg/dL (ref 8.9–10.3)
Chloride: 106 mmol/L (ref 98–111)
Creatinine, Ser: 2.16 mg/dL — ABNORMAL HIGH (ref 0.61–1.24)
GFR, Estimated: 30 mL/min — ABNORMAL LOW (ref >60)
Glucose, Bld: 86 mg/dL (ref 70–99)
Potassium: 3.9 mmol/L (ref 3.5–5.1)
Sodium: 142 mmol/L (ref 135–145)

## 2023-04-29 LAB — PROTIME-INR
INR: 1.6 — ABNORMAL HIGH (ref 0.8–1.2)
Prothrombin Time: 19.2 s — ABNORMAL HIGH (ref 11.4–15.2)

## 2023-04-29 LAB — TSH: TSH: 9.354 u[IU]/mL — ABNORMAL HIGH (ref 0.350–4.500)

## 2023-04-29 LAB — GLUCOSE, CAPILLARY: Glucose-Capillary: 78 mg/dL (ref 70–99)

## 2023-04-29 SURGERY — BIV ICD GENERATOR CHANGEOUT

## 2023-04-29 MED ORDER — SODIUM CHLORIDE 0.9 % IV SOLN: 80.0000 mg | INTRAVENOUS | Status: AC

## 2023-04-29 MED ORDER — LIDOCAINE HCL 1 % IJ SOLN
INTRAMUSCULAR | Status: AC
  Filled 2023-04-29: qty 60

## 2023-04-29 MED ORDER — CEFAZOLIN SODIUM-DEXTROSE 2-3 GM-%(50ML) IV SOLR: INTRAVENOUS | Status: DC | PRN

## 2023-04-29 MED ORDER — SODIUM CHLORIDE 0.9 % IV SOLN
INTRAVENOUS | Status: AC
  Administered 2023-04-29: 80 mg
  Filled 2023-04-29: qty 2

## 2023-04-29 MED ORDER — CHLORHEXIDINE GLUCONATE 4 % EX SOLN
4.0000 | Freq: Once | CUTANEOUS | Status: DC
  Filled 2023-04-29: qty 60

## 2023-04-29 MED ORDER — FENTANYL CITRATE (PF) 100 MCG/2ML IJ SOLN
INTRAMUSCULAR | Status: AC
  Filled 2023-04-29: qty 2

## 2023-04-29 MED ORDER — MIDAZOLAM HCL 5 MG/5ML IJ SOLN
INTRAMUSCULAR | Status: DC | PRN
  Administered 2023-04-29: 1 mg via INTRAVENOUS

## 2023-04-29 MED ORDER — SODIUM CHLORIDE 0.9% FLUSH
3.0000 mL | Freq: Two times a day (BID) | INTRAVENOUS | Status: DC
Start: 2023-04-29 — End: 2023-04-29

## 2023-04-29 MED ORDER — SODIUM CHLORIDE 0.9% FLUSH: 3.0000 mL | INTRAVENOUS | Status: DC | PRN

## 2023-04-29 MED ORDER — LIDOCAINE HCL (PF) 1 % IJ SOLN
INTRAMUSCULAR | Status: DC | PRN
  Administered 2023-04-29: 50 mL

## 2023-04-29 MED ORDER — SODIUM CHLORIDE 0.9 % IV SOLN: 250.0000 mL | INTRAVENOUS | Status: DC

## 2023-04-29 MED ORDER — CEFAZOLIN SODIUM-DEXTROSE 2-4 GM/100ML-% IV SOLN
INTRAVENOUS | Status: AC
  Administered 2023-04-29: 2 g via INTRAVENOUS
  Filled 2023-04-29: qty 100

## 2023-04-29 MED ORDER — ONDANSETRON HCL 4 MG/2ML IJ SOLN: 4.0000 mg | Freq: Four times a day (QID) | INTRAMUSCULAR | Status: DC | PRN

## 2023-04-29 MED ORDER — MIDAZOLAM HCL 5 MG/5ML IJ SOLN
INTRAMUSCULAR | Status: AC
  Filled 2023-04-29: qty 5

## 2023-04-29 MED ORDER — CEFAZOLIN SODIUM-DEXTROSE 2-4 GM/100ML-% IV SOLN: 2.0000 g | INTRAVENOUS | Status: AC

## 2023-04-29 MED ORDER — FENTANYL CITRATE (PF) 100 MCG/2ML IJ SOLN
INTRAMUSCULAR | Status: DC | PRN
  Administered 2023-04-29: 25 ug via INTRAVENOUS

## 2023-04-29 SURGICAL SUPPLY — 6 items
CABLE SURGICAL S-101-97-12 (CABLE) ×2 IMPLANT
ICD COBALT XT QUAD CRT DTPA2QQ (ICD Generator) IMPLANT
PAD DEFIB RADIO PHYSIO CONN (PAD) ×2 IMPLANT
POUCH AIGIS-R ANTIBACT ICD (Mesh General) ×1 IMPLANT
POUCH AIGIS-R ANTIBACT ICD LRG (Mesh General) IMPLANT
TRAY PACEMAKER INSERTION (PACKS) ×2 IMPLANT

## 2023-04-29 NOTE — Interval H&P Note (Signed)
History and Physical Interval Note:  04/29/2023 1:26 PM  Carlos Gibson  has presented today for surgery, with the diagnosis of eri.  The various methods of treatment have been discussed with the patient and family. After consideration of risks, benefits and other options for treatment, the patient has consented to  Procedure(s): BIV ICD GENERATOR CHANGEOUT (N/A) as a surgical intervention.  The patient's history has been reviewed, patient examined, no change in status, stable for surgery.  I have reviewed the patient's chart and labs.  Questions were answered to the patient's satisfaction.     Kupono Marling [employer]

## 2023-04-29 NOTE — Discharge Instructions (Signed)

## 2023-05-06 ENCOUNTER — Ambulatory Visit: Payer: Medicare Other | Attending: Cardiology | Admitting: Pharmacist

## 2023-05-06 DIAGNOSIS — I483 Typical atrial flutter: Secondary | ICD-10-CM | POA: Insufficient documentation

## 2023-05-06 DIAGNOSIS — I4819 Other persistent atrial fibrillation: Secondary | ICD-10-CM | POA: Insufficient documentation

## 2023-05-06 DIAGNOSIS — Z7901 Long term (current) use of anticoagulants: Secondary | ICD-10-CM | POA: Diagnosis not present

## 2023-05-06 LAB — POCT INR: INR: 2.1 (ref 2.0–3.0)

## 2023-05-06 NOTE — Patient Instructions (Signed)
Description   Continue taking warfarin 1/2 tablet daily except for 1 tablet on Tuesdays, Thursdays, and Saturdays. Stay consistent with greens each week.   Coumadin Clinic [phone].

## 2023-05-07 ENCOUNTER — Ambulatory Visit: Payer: Medicare Other

## 2023-05-07 DIAGNOSIS — I428 Other cardiomyopathies: Secondary | ICD-10-CM | POA: Diagnosis not present

## 2023-05-12 ENCOUNTER — Ambulatory Visit: Payer: Medicare Other | Attending: Internal Medicine

## 2023-05-12 ENCOUNTER — Telehealth: Payer: Self-pay

## 2023-05-12 DIAGNOSIS — I4510 Unspecified right bundle-branch block: Secondary | ICD-10-CM | POA: Diagnosis not present

## 2023-05-12 LAB — CUP PACEART INCLINIC DEVICE CHECK
Date Time Interrogation Session: 20250205155254
Implantable Lead Connection Status: 753985
Implantable Lead Connection Status: 753985
Implantable Lead Connection Status: 753985
Implantable Lead Implant Date: 20191023
Implantable Lead Implant Date: 20191023
Implantable Lead Implant Date: 20191023
Implantable Lead Location: 753858
Implantable Lead Location: 753859
Implantable Lead Location: 753860
Implantable Lead Model: 4598
Implantable Lead Model: 5076
Implantable Lead Model: 6935
Implantable Pulse Generator Implant Date: 20250123

## 2023-05-12 NOTE — Telephone Encounter (Signed)
 Unscheduled remote transmission: Scheduled non-billable. Normal device function. Ongoing persistent AF since 03/22/23 per prior device reports, V rates primarily blunted, on Warfarin per Epic. 1226 V. Sensing episodes averaging 3 hrs per day, longest 15 min on 05/05/23 at 23:44, EGMs c/w AF with triggered pacing. VP 86.9%, Effective CRT Pacing 69.5%; previously VP 98.0% with Effective Pacing 97.2% on 04/13/23 prior to Gen change, routed to clinic for review. Follow up as scheduled. MC, CVRS  Patient coming in today for 14 day post op device/wound check.  Will address at that visit.

## 2023-05-12 NOTE — Progress Notes (Signed)
 Normal ICD wound check. Wound well healed. Thresholds, sensing, and impedances consistent with implant measurements with at chronic outputs. LV pulse width increased to 0.1ms. LRL increased to 70 bpm d/t previous device programmed at that rate in hopes to increase Bi-V pacing percentage. Reviewed shock plan.  Pt enrolled in remote follow-up.

## 2023-05-12 NOTE — Patient Instructions (Addendum)
   After Your ICD (Implantable Cardiac Defibrillator)    Monitor your defibrillator site for redness, swelling, and drainage. Call the device clinic at [phone] if you experience these symptoms or fever/chills.  Your incision was closed with Steri-strips or staples:  You may shower 7 days after your procedure and wash your incision with soap and water. Avoid lotions, ointments, or perfumes over your incision until it is well-healed.  You may use a hot tub or a pool after your wound check appointment if the incision is completely closed.   Your ICD is designed to protect you from life threatening heart rhythms. Because of this, you may receive a shock.   1 shock with no symptoms:  Call the office during business hours. 1 shock with symptoms (chest pain, chest pressure, dizziness, lightheadedness, shortness of breath, overall feeling unwell):  Call 911. If you experience 2 or more shocks in 24 hours:  Call 911. If you receive a shock, you should not drive.  [place] DMV - no driving for 6 months if you receive appropriate therapy from your ICD.   ICD Alerts:  Some alerts are vibratory and others beep. These are NOT emergencies. Please call our office to let us know. If this occurs at night or on weekends, it can wait until the next business day. Send a remote transmission.  If your device is capable of reading fluid status (for heart failure), you will be offered monthly monitoring to review this with you.   Remote monitoring is used to monitor your ICD from home. This monitoring is scheduled every 91 days by our office. It allows us to keep an eye on the functioning of your device to ensure it is working properly. You will routinely see your Electrophysiologist annually (more often if necessary).  

## 2023-05-17 ENCOUNTER — Encounter (HOSPITAL_COMMUNITY): Payer: Self-pay | Admitting: Physician Assistant

## 2023-05-17 ENCOUNTER — Telehealth: Payer: Self-pay

## 2023-05-17 ENCOUNTER — Ambulatory Visit (HOSPITAL_COMMUNITY)
Admission: RE | Admit: 2023-05-17 | Discharge: 2023-05-17 | Disposition: A | Discharge status: Home / Self Care | Payer: Medicare Other | Source: Ambulatory Visit | Attending: Physician Assistant | Admitting: Physician Assistant

## 2023-05-17 VITALS — BP 128/80 | HR 86 | Ht 71.0 in | Wt 168.6 lb

## 2023-05-17 DIAGNOSIS — I5022 Chronic systolic (congestive) heart failure: Secondary | ICD-10-CM | POA: Diagnosis not present

## 2023-05-17 DIAGNOSIS — I484 Atypical atrial flutter: Secondary | ICD-10-CM

## 2023-05-17 DIAGNOSIS — D6869 Other thrombophilia: Secondary | ICD-10-CM | POA: Diagnosis not present

## 2023-05-17 DIAGNOSIS — E1122 Type 2 diabetes mellitus with diabetic chronic kidney disease: Secondary | ICD-10-CM | POA: Diagnosis not present

## 2023-05-17 DIAGNOSIS — I130 Hypertensive heart and chronic kidney disease with heart failure and stage 1 through stage 4 chronic kidney disease, or unspecified chronic kidney disease: Secondary | ICD-10-CM | POA: Diagnosis not present

## 2023-05-17 DIAGNOSIS — I4819 Other persistent atrial fibrillation: Secondary | ICD-10-CM | POA: Diagnosis not present

## 2023-05-17 DIAGNOSIS — Z5181 Encounter for therapeutic drug level monitoring: Secondary | ICD-10-CM

## 2023-05-17 DIAGNOSIS — Z7901 Long term (current) use of anticoagulants: Secondary | ICD-10-CM | POA: Diagnosis not present

## 2023-05-17 DIAGNOSIS — Z95810 Presence of automatic (implantable) cardiac defibrillator: Secondary | ICD-10-CM | POA: Diagnosis not present

## 2023-05-17 DIAGNOSIS — N183 Chronic kidney disease, stage 3 unspecified: Secondary | ICD-10-CM | POA: Diagnosis not present

## 2023-05-17 DIAGNOSIS — Z79899 Other long term (current) drug therapy: Secondary | ICD-10-CM | POA: Insufficient documentation

## 2023-05-17 NOTE — Telephone Encounter (Signed)
-----   Message from Nurse Stacy C sent at 05/17/2023  3:28 PM EST ----- Regarding: weekly INRs for DCCV Pt needs to start weekly INR checks in preparation for DCCV to be scheduled. Thank you ! Stacy RN AFib Clinic

## 2023-05-17 NOTE — Telephone Encounter (Signed)
 I tried calling patient to schedule INR appt for this week, but mailbox full.

## 2023-05-17 NOTE — Progress Notes (Signed)
 Primary Care Physician: Benedetta Bradley, MD Referring Physician: Dr. 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 Patient has been maintained on amiodarone  for rhythm control. He underwent ICD replacement on 04/29/23 with Dr Lawana Pray. His device has shown persistent afib since 03/22/23. He does report symptoms of fatigue with exertion and intermittent dizziness.   Today, he denies symptoms of palpitations, chest pain, shortness of breath, orthopnea, PND, lower extremity edema, presyncope, syncope, snoring, daytime somnolence, bleeding, or neurologic sequela. The patient is tolerating medications without difficulties and is otherwise without complaint today.    Past Medical History:  Diagnosis Date   Aortic regurgitation    a. mild AI by echo 01/2016.   Aortic root dilation (HCC)    Aortic valve regurgitation    Bifascicular block    Chronic systolic CHF (congestive heart failure) (HCC)    CKD (chronic kidney disease), stage III (HCC) 01/01/2016   Diabetes (HCC)    Ejection fraction < 50%    Frequent PVCs    Gilbert's syndrome    Hyperlipidemia    Mild diastolic dysfunction    Mitral regurgitation    a. mod by echo 10/207.   NICM (nonischemic cardiomyopathy) (HCC)    Pericardial effusion    a. small by echo 01/2016.   Pulmonary hypertension (HCC)    Right BBB/left post fasc block    Thrombocytopenia (HCC)     Current Outpatient Medications  Medication Sig Dispense Refill   amiodarone  (PACERONE ) 200 MG tablet TAKE ONE TABLET BY MOUTH ONCE DAILY 30 tablet 0   atorvastatin  (LIPITOR) 10 MG tablet Take 10 mg by mouth at bedtime.     bisoprolol  (ZEBETA ) 5 MG tablet TAKE ONE TABLET BY MOUTH DAILY 90 tablet 3   furosemide  (LASIX ) 40 MG tablet Take 40 mg by  mouth as needed for fluid or edema.     hydrALAZINE (APRESOLINE) 25 MG tablet Take 25 mg by mouth 2 (two) times daily.     isosorbide mononitrate (IMDUR) 30 MG 24 hr tablet Take 30 mg by mouth daily.     warfarin (COUMADIN ) 5 MG tablet TAKE 1/2 TO 1 TABLET BY MOUTH ONCE DAILY AS DIRECTED by anticoagulation clinic (Patient taking differently: Take 2.5-5 mg by mouth See admin instructions. Take 2.5 mg Tues., Thurs., and Sat. All the other days take 5 mg at bedtime) 30 tablet 5   No current facility-administered medications for this encounter.    ROS- All systems are reviewed and negative except as per the HPI above  Physical Exam: Vitals:   05/17/23 1459  BP: 128/80  Pulse: 86  Weight: 76.5 kg  Height: 5\' 11"  (1.803 m)   Wt Readings from Last 3 Encounters:  05/17/23 76.5 kg  04/29/23 80.7 kg  04/12/23 77.8 kg    GEN: Well nourished, well developed in no acute distress CARDIAC: Regular rate and rhythm, no murmurs, rubs, gallops RESPIRATORY:  Clear to auscultation without rales, wheezing or rhonchi  ABDOMEN: Soft, non-tender, non-distended EXTREMITIES:  No edema; No deformity    EKG today demonstrates V pacing with underlying atrial flutter Vent. rate 86 BPM PR interval * ms QRS duration 192 ms QT/QTcB 492/588 ms   Echo 11/06/21  1. Akinesis of the inferior and inferolateral walls and hypokinesis of  remaining walls; overall  severe LV dysfunction.   2. Left ventricular ejection fraction, by estimation, is 25 to 30%. The  left ventricle has severely decreased function. The left ventricle  demonstrates regional wall motion abnormalities (see scoring  diagram/findings for description). The left  ventricular internal cavity size was severely dilated. Left ventricular  diastolic parameters are consistent with Grade II diastolic dysfunction  (pseudonormalization). Elevated left atrial pressure.   3. Right ventricular systolic function is mildly reduced. The right  ventricular  size is mildly enlarged.   4. Left atrial size was severely dilated.   5. Right atrial size was moderately dilated.   6. A small pericardial effusion is present.   7. The mitral valve is normal in structure. Severe mitral valve  regurgitation. No evidence of mitral stenosis.   8. Tricuspid valve regurgitation is moderate.   9. The aortic valve is tricuspid. Aortic valve regurgitation is moderate  to severe. No aortic stenosis is present.  10. Aortic dilatation noted. There is borderline dilatation of the aortic  root, measuring 39 mm.  11. The inferior vena cava is normal in size with greater than 50%  respiratory variability, suggesting right atrial pressure of 3 mmHg.     CHA2DS2-VASc Score = 5  The patient's score is based upon: CHF History: 1 HTN History: 1 Diabetes History: 1 Stroke History: 0 Vascular Disease History: 0 Age Score: 2 Gender Score: 0       ASSESSMENT AND PLAN: Persistent Atrial Fibrillation/atypical atrial flutter  The patient's CHA2DS2-VASc score is 5, indicating a 7.2% annual risk of stroke.   Persistent atrial flutter since 03/22/23 We discussed rhythm control options today. Will plan for DCCV once he has had 4 therapeutic weekly INRs.  Continue amiodarone  200 mg daily Continue bisoprolol  5 mg daily Continue warfarin  Secondary Hypercoagulable State (ICD10:  D68.69) The patient is at significant risk for stroke/thromboembolism based upon his CHA2DS2-VASc Score of 5.  Continue Warfarin (Coumadin ).   High Risk Medication Monitoring (ICD 10: Z79.899) Intervals on ECG appropriate for amiodarone  monitoring.    HTN Stable on current regimen  Chronic systolic CHF EF 25-30%, s/p ICD GDMT per primary cardiology team Fluid status appears stable   Follow up with Dr Lawana Pray as scheduled.    Informed Consent   Shared Decision Making/Informed Consent The risks (stroke, cardiac arrhythmias rarely resulting in the need for a temporary or permanent  pacemaker, skin irritation or burns and complications associated with conscious sedation including aspiration, arrhythmia, respiratory failure and death), benefits (restoration of normal sinus rhythm) and alternatives of a direct current cardioversion were explained in detail to Mr. Carlos Gibson and he agrees to proceed.        Myrtha Ates PA-C Afib Clinic [facility] [address] [place], [place] [address] [phone]

## 2023-05-17 NOTE — Telephone Encounter (Signed)
 Called and spoke with pt. Scheduled INR check with anticoagulation clinic on 05/21/23 at 3:15pm. Pt is aware he will need weekly INR checks for upcoming DCCV.

## 2023-05-17 NOTE — Patient Instructions (Signed)
 Will need 4 weekly (within range) INRs checks before cardioversion   Once you have had 2 INRs in a row -- call Karn Derk to set up a cardioversion date [phone]

## 2023-05-19 NOTE — Progress Notes (Signed)
Remote ICD transmission.

## 2023-05-21 ENCOUNTER — Ambulatory Visit: Payer: Medicare Other | Attending: Internal Medicine

## 2023-05-21 DIAGNOSIS — Z79899 Other long term (current) drug therapy: Secondary | ICD-10-CM | POA: Insufficient documentation

## 2023-05-21 DIAGNOSIS — Z7901 Long term (current) use of anticoagulants: Secondary | ICD-10-CM | POA: Insufficient documentation

## 2023-05-21 DIAGNOSIS — Z5181 Encounter for therapeutic drug level monitoring: Secondary | ICD-10-CM | POA: Diagnosis present

## 2023-05-21 DIAGNOSIS — I483 Typical atrial flutter: Secondary | ICD-10-CM | POA: Diagnosis not present

## 2023-05-21 LAB — POCT INR: INR: 3.1 — AB (ref 2.0–3.0)

## 2023-05-21 NOTE — Patient Instructions (Signed)
Continue taking warfarin 1/2 tablet daily except for 1 tablet on Tuesdays, Thursdays, and Saturdays. Stay consistent with greens each week. DCCV TBD; Weekly INRs;  INR in 1 week  Coumadin Clinic [phone].

## 2023-05-28 ENCOUNTER — Ambulatory Visit: Payer: Medicare Other | Attending: Internal Medicine

## 2023-05-28 DIAGNOSIS — I483 Typical atrial flutter: Secondary | ICD-10-CM | POA: Diagnosis not present

## 2023-05-28 DIAGNOSIS — Z7901 Long term (current) use of anticoagulants: Secondary | ICD-10-CM | POA: Insufficient documentation

## 2023-05-28 LAB — POCT INR: INR: 3.3 — AB (ref 2.0–3.0)

## 2023-05-28 NOTE — Patient Instructions (Signed)
Description   Eat a serving of greens today and continue taking warfarin 1/2 tablet daily except for 1 tablet on Tuesdays, Thursdays, and Saturdays.  Stay consistent with greens each week. DCCV TBD; Weekly INRs - Recheck INR in 1 week Coumadin Clinic [phone].

## 2023-06-03 ENCOUNTER — Ambulatory Visit: Payer: Medicare Other | Attending: Cardiology

## 2023-06-03 ENCOUNTER — Telehealth: Payer: Self-pay | Admitting: Cardiology

## 2023-06-03 DIAGNOSIS — Z7901 Long term (current) use of anticoagulants: Secondary | ICD-10-CM | POA: Diagnosis not present

## 2023-06-03 DIAGNOSIS — I483 Typical atrial flutter: Secondary | ICD-10-CM | POA: Diagnosis not present

## 2023-06-03 LAB — POCT INR: INR: 3.9 — AB (ref 2.0–3.0)

## 2023-06-03 MED ORDER — FUROSEMIDE 40 MG PO TABS: 40.0000 mg | ORAL_TABLET | ORAL | 3 refills | Status: DC | PRN

## 2023-06-03 NOTE — Patient Instructions (Signed)
 Description   START taking warfarin 1/2 tablet daily except for 1 tablet on Tuesdays and Saturdays.  Stay consistent with greens each week. DCCV TBD; Weekly INRs  Recheck INR in 1 week Coumadin Clinic [phone].

## 2023-06-03 NOTE — Telephone Encounter (Signed)
*  STAT* If patient is at the pharmacy, call can be transferred to refill team.   1. Which medications need to be refilled? (please list name of each medication and dose if known)  furosemide (LASIX) 40 MG tablet  2. Which pharmacy/location (including street and city if local pharmacy) is medication to be sent to? [id number] - [place], [place] - [address] AT [address]  3. Do they need a 30 day or 90 day supply? 90  

## 2023-06-10 ENCOUNTER — Ambulatory Visit: Payer: Medicare Other | Attending: Cardiology | Admitting: *Deleted

## 2023-06-10 DIAGNOSIS — Z5181 Encounter for therapeutic drug level monitoring: Secondary | ICD-10-CM | POA: Insufficient documentation

## 2023-06-10 DIAGNOSIS — Z7901 Long term (current) use of anticoagulants: Secondary | ICD-10-CM | POA: Insufficient documentation

## 2023-06-10 DIAGNOSIS — I483 Typical atrial flutter: Secondary | ICD-10-CM | POA: Insufficient documentation

## 2023-06-10 LAB — POCT INR: POC INR: 2.6

## 2023-06-10 NOTE — Patient Instructions (Signed)
 Description   Continue taking warfarin 1/2 tablet daily except for 1 tablet on Tuesdays and Saturdays.  Stay consistent with greens each week. DCCV TBD; Weekly INRs  Recheck INR in 1 week [facility] [phone].

## 2023-06-11 ENCOUNTER — Other Ambulatory Visit: Payer: Self-pay | Admitting: *Deleted

## 2023-06-11 ENCOUNTER — Telehealth (HOSPITAL_COMMUNITY): Payer: Self-pay | Admitting: *Deleted

## 2023-06-11 ENCOUNTER — Telehealth: Payer: Self-pay | Admitting: Cardiology

## 2023-06-11 MED ORDER — FUROSEMIDE 40 MG PO TABS: 40.0000 mg | ORAL_TABLET | Freq: Every day | ORAL | 0 refills | Status: DC | PRN

## 2023-06-11 NOTE — Telephone Encounter (Signed)
 This is a shared pt of Dr. Elberta Fortis and Dr. Anne Fu. Since the pt hasn't been seen since 7/23 by Dr. Anne Fu and Dr. Elberta Fortis saw him in 01/25, would Dr. Elberta Fortis be willing to refill this RX?

## 2023-06-11 NOTE — Telephone Encounter (Signed)
*  STAT* If patient is at the pharmacy, call can be transferred to refill team.   1. Which medications need to be refilled? (please list name of each medication and dose if known) Furosemide   2. Would you like to learn more about the convenience, safety, & potential cost savings by using the [facility]?      3. Are you open to using the [facility] (Type [facility].   4. Which pharmacy/location (including street and city if local pharmacy) is medication to be sent to? [facility] and [facility]   5. Do they need a 30 day or 90 day supply? 90 days and refills- please call this in today(out of medicine(

## 2023-06-11 NOTE — Progress Notes (Signed)
 Remote ICD transmission.

## 2023-06-11 NOTE — Telephone Encounter (Signed)
 This should go through general cardiologist Elberta Fortis has never filled this medication, only as an IP).  I did speak w/ pharmacy -- when Rx sent in will need frequency added (ex:  once daily, etc)

## 2023-06-11 NOTE — Telephone Encounter (Signed)
 Reach out to pt to schedule cardioversion. Mailbox is full unable to leave message. Will attempt to contact pt again.

## 2023-06-11 NOTE — Telephone Encounter (Signed)
 OK to refill per Dr Anne Fu

## 2023-06-11 NOTE — Telephone Encounter (Signed)
 Patient called in and stated pharmacy said we sent in wrong Rx.  Spoke with Trinna Post at [facility] to verify RX is lasix 40 mg daily as needed for fluid . He states he will get thr RX updated

## 2023-06-15 ENCOUNTER — Telehealth: Payer: Self-pay | Admitting: Cardiology

## 2023-06-15 NOTE — Telephone Encounter (Signed)
 Attempted to contact pt.  No answer. Mailbox is full and unable to leave message.  Of note - this is not a medication cardiology ordered in the past or would typically ever order. For BPH and/or hair loss.

## 2023-06-15 NOTE — Telephone Encounter (Signed)
 Pt c/o medication issue:  1. Name of Medication: finasteride (PROSCAR) 5 MG tablet  2. How are you currently taking this medication (dosage and times per day)? Take 5 mg by mouth daily.   3. Are you having a reaction (difficulty breathing--STAT)? No  4. What is your medication issue? Pt is requesting a callback regarding him wanting to be back on this medication. It was discontinued due to pt's preference but he stated Dr. Elberta Fortis or Dr. Anne Fu advised him to be placed back on it so he needs a refill. Pt would like a callback to discuss further. Please advise

## 2023-06-17 ENCOUNTER — Ambulatory Visit: Attending: Cardiology | Admitting: *Deleted

## 2023-06-17 DIAGNOSIS — Z5181 Encounter for therapeutic drug level monitoring: Secondary | ICD-10-CM | POA: Insufficient documentation

## 2023-06-17 DIAGNOSIS — I483 Typical atrial flutter: Secondary | ICD-10-CM | POA: Diagnosis not present

## 2023-06-17 DIAGNOSIS — Z7901 Long term (current) use of anticoagulants: Secondary | ICD-10-CM | POA: Insufficient documentation

## 2023-06-17 LAB — POCT INR: POC INR: 2.2

## 2023-06-17 NOTE — Telephone Encounter (Signed)
 Spoke with pt and advised to contact Dr Modena Slater to disgust restarting if needed.

## 2023-06-17 NOTE — Patient Instructions (Signed)
 Description   Continue taking warfarin 1/2 tablet daily except for 1 tablet on Tuesdays and Saturdays.  Stay consistent with greens each week. DCCV TBD; Weekly INRs  Recheck INR in 1 week [facility] [phone].

## 2023-06-18 ENCOUNTER — Ambulatory Visit (HOSPITAL_COMMUNITY)
Admission: RE | Admit: 2023-06-18 | Discharge: 2023-06-18 | Disposition: A | Discharge status: Home / Self Care | Source: Ambulatory Visit | Attending: Physician Assistant | Admitting: Physician Assistant

## 2023-06-18 ENCOUNTER — Encounter (HOSPITAL_COMMUNITY): Payer: Self-pay | Admitting: Physician Assistant

## 2023-06-18 VITALS — BP 118/70 | HR 87 | Ht 71.0 in | Wt 165.4 lb

## 2023-06-18 DIAGNOSIS — Z79899 Other long term (current) drug therapy: Secondary | ICD-10-CM | POA: Insufficient documentation

## 2023-06-18 DIAGNOSIS — I130 Hypertensive heart and chronic kidney disease with heart failure and stage 1 through stage 4 chronic kidney disease, or unspecified chronic kidney disease: Secondary | ICD-10-CM | POA: Insufficient documentation

## 2023-06-18 DIAGNOSIS — Z5181 Encounter for therapeutic drug level monitoring: Secondary | ICD-10-CM | POA: Diagnosis not present

## 2023-06-18 DIAGNOSIS — I484 Atypical atrial flutter: Secondary | ICD-10-CM | POA: Insufficient documentation

## 2023-06-18 DIAGNOSIS — Z7901 Long term (current) use of anticoagulants: Secondary | ICD-10-CM | POA: Diagnosis not present

## 2023-06-18 DIAGNOSIS — I4819 Other persistent atrial fibrillation: Secondary | ICD-10-CM | POA: Diagnosis not present

## 2023-06-18 DIAGNOSIS — I5022 Chronic systolic (congestive) heart failure: Secondary | ICD-10-CM | POA: Diagnosis not present

## 2023-06-18 DIAGNOSIS — N183 Chronic kidney disease, stage 3 unspecified: Secondary | ICD-10-CM | POA: Insufficient documentation

## 2023-06-18 DIAGNOSIS — Z95810 Presence of automatic (implantable) cardiac defibrillator: Secondary | ICD-10-CM | POA: Diagnosis not present

## 2023-06-18 DIAGNOSIS — D6869 Other thrombophilia: Secondary | ICD-10-CM | POA: Insufficient documentation

## 2023-06-18 DIAGNOSIS — E1122 Type 2 diabetes mellitus with diabetic chronic kidney disease: Secondary | ICD-10-CM | POA: Insufficient documentation

## 2023-06-18 LAB — BASIC METABOLIC PANEL
Anion gap: 6 (ref 5–15)
BUN: 24 mg/dL — ABNORMAL HIGH (ref 8–23)
CO2: 24 mmol/L (ref 22–32)
Calcium: 9 mg/dL (ref 8.9–10.3)
Chloride: 108 mmol/L (ref 98–111)
Creatinine, Ser: 2.19 mg/dL — ABNORMAL HIGH (ref 0.61–1.24)
GFR, Estimated: 29 mL/min — ABNORMAL LOW (ref >60)
Glucose, Bld: 142 mg/dL — ABNORMAL HIGH (ref 70–99)
Potassium: 3.6 mmol/L (ref 3.5–5.1)
Sodium: 138 mmol/L (ref 135–145)

## 2023-06-18 LAB — CBC
HCT: 44 % (ref 39.0–52.0)
Hemoglobin: 14.1 g/dL (ref 13.0–17.0)
MCH: 29 pg (ref 26.0–34.0)
MCHC: 32 g/dL (ref 30.0–36.0)
MCV: 90.3 fL (ref 80.0–100.0)
Platelets: 154 10*3/uL (ref 150–400)
RBC: 4.87 MIL/uL (ref 4.22–5.81)
RDW: 14.2 % (ref 11.5–15.5)
WBC: 4.4 10*3/uL (ref 4.0–10.5)
nRBC: 0 % (ref 0.0–0.2)

## 2023-06-18 NOTE — Progress Notes (Signed)
 Primary Care Physician: Emilio Aspen, MD Referring Physician: Dr. 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 Patient has been maintained on amiodarone for rhythm control. He underwent ICD replacement on 04/29/23 with Dr Elberta Fortis. His device has shown persistent afib since 03/22/23. He does report symptoms of fatigue with exertion and intermittent dizziness.   Patient returns for follow up for atrial fibrillation and amiodarone monitoring. He remains in persistent afib with symptoms of fatigue with exertion and intermittent dizziness. He is scheduled for DCCV on 06/24/23. He denies any bleeding issues on anticoagulation.   Today, he denies symptoms of palpitations, chest pain, shortness of breath, orthopnea, PND, lower extremity edema, presyncope, syncope, snoring, daytime somnolence, bleeding, or neurologic sequela. The patient is tolerating medications without difficulties and is otherwise without complaint today.    Past Medical History:  Diagnosis Date   Aortic regurgitation    a. mild AI by echo 01/2016.   Aortic root dilation (HCC)    Aortic valve regurgitation    Bifascicular block    Chronic systolic CHF (congestive heart failure) (HCC)    CKD (chronic kidney disease), stage III (HCC) 01/01/2016   Diabetes (HCC)    Ejection fraction < 50%    Frequent PVCs    Gilbert's syndrome    Hyperlipidemia    Mild diastolic dysfunction    Mitral regurgitation    a. mod by echo 10/207.   NICM (nonischemic cardiomyopathy) (HCC)    Pericardial effusion    a. small by echo 01/2016.   Pulmonary hypertension (HCC)    Right BBB/left post fasc block    Thrombocytopenia (HCC)     Current Outpatient Medications  Medication Sig Dispense Refill   amiodarone  (PACERONE) 200 MG tablet TAKE ONE TABLET BY MOUTH ONCE DAILY 30 tablet 0   atorvastatin (LIPITOR) 10 MG tablet Take 10 mg by mouth at bedtime.     bisoprolol (ZEBETA) 5 MG tablet TAKE ONE TABLET BY MOUTH DAILY 90 tablet 3   finasteride (PROSCAR) 5 MG tablet Take 5 mg by mouth daily.     furosemide (LASIX) 40 MG tablet Take 1 tablet (40 mg total) by mouth daily as needed for fluid. Must have office visit with Dr Anne Fu for further refills or obtain from PCP 90 tablet 0   hydrALAZINE (APRESOLINE) 25 MG tablet Take 25 mg by mouth 2 (two) times daily.     isosorbide mononitrate (IMDUR) 30 MG 24 hr tablet Take 30 mg by mouth daily.     warfarin (COUMADIN) 5 MG tablet TAKE 1/2 TO 1 TABLET BY MOUTH ONCE DAILY AS DIRECTED by anticoagulation clinic (Patient taking differently: Take 2.5-5 mg by mouth See admin instructions. Take 2.5 mg Tues., Thurs., and Sat. All the other days take 5 mg at bedtime) 30 tablet 5   No current facility-administered medications for this encounter.    ROS- All systems are reviewed and negative except as per the HPI above  Physical Exam: Vitals:   06/18/23 0847  BP: 118/70  Pulse: 87  Weight: 75 kg  Height: 5\' 11"  (1.803 m)    Wt Readings from Last 3 Encounters:  06/18/23 75 kg  05/17/23 76.5 kg  04/29/23 80.7 kg    GEN: Well nourished, well developed in no acute distress NECK: No JVD;  No carotid bruits CARDIAC: Regular rate and rhythm, no murmurs, rubs, gallops RESPIRATORY:  Clear to auscultation without rales, wheezing or rhonchi  ABDOMEN: Soft, non-tender, non-distended EXTREMITIES:  No edema; No deformity    EKG today demonstrates V pacing with underlying atypical atrial flutter Vent. rate 87 BPM PR interval * ms QRS duration 196 ms QT/QTcB 478/575 ms   Echo 11/06/21  1. Akinesis of the inferior and inferolateral walls and hypokinesis of  remaining walls; overall severe LV dysfunction.   2. Left ventricular ejection fraction, by estimation, is 25 to  30%. The  left ventricle has severely decreased function. The left ventricle  demonstrates regional wall motion abnormalities (see scoring  diagram/findings for description). The left  ventricular internal cavity size was severely dilated. Left ventricular  diastolic parameters are consistent with Grade II diastolic dysfunction  (pseudonormalization). Elevated left atrial pressure.   3. Right ventricular systolic function is mildly reduced. The right  ventricular size is mildly enlarged.   4. Left atrial size was severely dilated.   5. Right atrial size was moderately dilated.   6. A small pericardial effusion is present.   7. The mitral valve is normal in structure. Severe mitral valve  regurgitation. No evidence of mitral stenosis.   8. Tricuspid valve regurgitation is moderate.   9. The aortic valve is tricuspid. Aortic valve regurgitation is moderate  to severe. No aortic stenosis is present.  10. Aortic dilatation noted. There is borderline dilatation of the aortic  root, measuring 39 mm.  11. The inferior vena cava is normal in size with greater than 50%  respiratory variability, suggesting right atrial pressure of 3 mmHg.     CHA2DS2-VASc Score = 5  The patient's score is based upon: CHF History: 1 HTN History: 1 Diabetes History: 1 Stroke History: 0 Vascular Disease History: 0 Age Score: 2 Gender Score: 0       ASSESSMENT AND PLAN: Persistent Atrial Fibrillation (ICD10:  I48.19) The patient's CHA2DS2-VASc score is 5, indicating a 7.2% annual risk of stroke.   Persistent atrial flutter starting 03/22/23 Patient has had at least 4 therapeutic INR levels, scheduled for DCCV on 06/24/23. Check bmet/cbc today  Continue amiodarone 200 mg daily Continue bisoprolol 5 mg daily Continue warfarin  Secondary Hypercoagulable State (ICD10:  D68.69) The patient is at significant risk for stroke/thromboembolism based upon his CHA2DS2-VASc Score of 5.  Continue Warfarin  (Coumadin). No bleeding issues.   High Risk Medication Monitoring (ICD 10: Z79.899) Intervals on ECG acceptable for amiodarone monitoring in setting of V pacing.   HTN Stable on current regimen  Chronic HFrEF EF 25-30%, s/p ICD GDMT per primary cardiology team Fluid status appears stable today   Follow up with Dr Elberta Fortis as scheduled.    Informed Consent   Shared Decision Making/Informed Consent The risks (stroke, cardiac arrhythmias rarely resulting in the need for a temporary or permanent pacemaker, skin irritation or burns and complications associated with conscious sedation including aspiration, arrhythmia, respiratory failure and death), benefits (restoration of normal sinus rhythm) and alternatives of a direct current cardioversion were explained in detail to Mr. Maertens and he agrees to proceed.       Jorja Loa PA-C Af[facility] [address] [place], [place] [address] [phone]

## 2023-06-18 NOTE — Patient Instructions (Addendum)
 Cardioversion scheduled for: Thursday, March 20th   Have INR checked at 845am at coumadin clinic   - Arrive at the [facility] and go to admitting at 10:00am   - Do not eat or drink anything after midnight the night prior to your procedure.   - Take all your morning medication (except diabetic medications) with a sip of water prior to arrival.  - You will not be able to drive home after your procedure.    - Do NOT miss any doses of your blood thinner - if you should miss a dose please notify our office immediately.   - If you feel as if you go back into normal rhythm prior to scheduled cardioversion, please notify our office immediately.   If your procedure is canceled in the cardioversion suite you will be charged a cancellation fee.

## 2023-06-23 NOTE — Progress Notes (Signed)
 Spoke to patient and instructed them to come at 10:30  and to be NPO after 0000. Medications reviewed.   Confirmed that patient will have a ride home and someone to stay with them for 24 hours after the procedure.

## 2023-06-24 ENCOUNTER — Encounter (HOSPITAL_COMMUNITY)
Admission: RE | Disposition: A | Discharge status: Home / Self Care | Payer: Self-pay | Source: Home / Self Care | Attending: Cardiology

## 2023-06-24 ENCOUNTER — Ambulatory Visit (HOSPITAL_COMMUNITY)
Admission: RE | Admit: 2023-06-24 | Discharge: 2023-06-24 | Disposition: A | Discharge status: Home / Self Care | Attending: Cardiology | Admitting: Cardiology

## 2023-06-24 ENCOUNTER — Encounter

## 2023-06-24 ENCOUNTER — Other Ambulatory Visit: Payer: Self-pay

## 2023-06-24 ENCOUNTER — Ambulatory Visit (HOSPITAL_COMMUNITY): Admitting: Anesthesiology

## 2023-06-24 ENCOUNTER — Encounter (HOSPITAL_COMMUNITY): Payer: Self-pay | Admitting: Cardiology

## 2023-06-24 ENCOUNTER — Ambulatory Visit

## 2023-06-24 DIAGNOSIS — I5023 Acute on chronic systolic (congestive) heart failure: Secondary | ICD-10-CM

## 2023-06-24 DIAGNOSIS — I4891 Unspecified atrial fibrillation: Secondary | ICD-10-CM | POA: Diagnosis not present

## 2023-06-24 DIAGNOSIS — Z7901 Long term (current) use of anticoagulants: Secondary | ICD-10-CM | POA: Insufficient documentation

## 2023-06-24 DIAGNOSIS — I4819 Other persistent atrial fibrillation: Secondary | ICD-10-CM | POA: Insufficient documentation

## 2023-06-24 DIAGNOSIS — Z95810 Presence of automatic (implantable) cardiac defibrillator: Secondary | ICD-10-CM | POA: Insufficient documentation

## 2023-06-24 DIAGNOSIS — I130 Hypertensive heart and chronic kidney disease with heart failure and stage 1 through stage 4 chronic kidney disease, or unspecified chronic kidney disease: Secondary | ICD-10-CM | POA: Insufficient documentation

## 2023-06-24 DIAGNOSIS — N183 Chronic kidney disease, stage 3 unspecified: Secondary | ICD-10-CM

## 2023-06-24 DIAGNOSIS — E1122 Type 2 diabetes mellitus with diabetic chronic kidney disease: Secondary | ICD-10-CM | POA: Diagnosis not present

## 2023-06-24 DIAGNOSIS — D6869 Other thrombophilia: Secondary | ICD-10-CM | POA: Insufficient documentation

## 2023-06-24 DIAGNOSIS — I272 Pulmonary hypertension, unspecified: Secondary | ICD-10-CM | POA: Insufficient documentation

## 2023-06-24 DIAGNOSIS — Z79899 Other long term (current) drug therapy: Secondary | ICD-10-CM | POA: Diagnosis not present

## 2023-06-24 DIAGNOSIS — I5022 Chronic systolic (congestive) heart failure: Secondary | ICD-10-CM | POA: Diagnosis not present

## 2023-06-24 HISTORY — PX: CARDIOVERSION: EP1203

## 2023-06-24 LAB — PROTIME-INR
INR: 2.2 — ABNORMAL HIGH (ref 0.8–1.2)
Prothrombin Time: 24.6 s — ABNORMAL HIGH (ref 11.4–15.2)

## 2023-06-24 SURGERY — CARDIOVERSION (CATH LAB)
Anesthesia: General

## 2023-06-24 MED ORDER — SODIUM CHLORIDE 0.9% FLUSH: 3.0000 mL | Freq: Two times a day (BID) | INTRAVENOUS | Status: DC

## 2023-06-24 MED ORDER — PROPOFOL 10 MG/ML IV BOLUS
INTRAVENOUS | Status: DC | PRN
  Administered 2023-06-24: 50 mg via INTRAVENOUS

## 2023-06-24 MED ORDER — SODIUM CHLORIDE 0.9% FLUSH
3.0000 mL | INTRAVENOUS | Status: DC | PRN
  Administered 2023-06-24: 10 mL via INTRAVENOUS

## 2023-06-24 MED ORDER — LIDOCAINE 2% (20 MG/ML) 5 ML SYRINGE
INTRAMUSCULAR | Status: DC | PRN
  Administered 2023-06-24: 50 mg via INTRAVENOUS

## 2023-06-24 SURGICAL SUPPLY — 1 items: PAD DEFIB RADIO PHYSIO CONN (PAD) ×2 IMPLANT

## 2023-06-24 NOTE — H&P (Signed)
 ATRIAL FIB OFFICE VISIT 06/18/2023 [place] [facility] at [facility]    [place], [place], [place] [facility] Atypical atrial flutter [facility]) +3 more Dx Referred by Emilio Aspen, MD Reason for Visit   Additional Documentation  Vitals: BP 118/70   Pulse 87   Ht 5\' 11"  (1.803 m)   Wt 75 kg   BMI 23.07 kg/m   BSA 1.94 m  Flowsheets: NEWS,   MEWS Score,   Vital Signs,   Anthropometrics,   Method of Visit  Encounter Info: Billing Info,   History,   Allergies,   Detailed Report   All Notes   Progress Notes by Danice Goltz, PA at 06/18/2023 9:00 AM  Author: Danice Goltz, PA Author Type: Physician Assistant Filed: 06/18/2023  9:10 AM  Note Status: Signed Cosign: Cosign Not Required Date of Service: 06/18/2023  9:00 AM  Editor: Danice Goltz, PA (Physician Assistant)             Expand All Collapse All    Primary Care Physician: Emilio Aspen, MD Referring Physician: Dr. 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 Patient has been maintained on amiodarone for rhythm control. He underwent ICD replacement on 04/29/23 with Dr Elberta Fortis. His device has shown persistent afib since 03/22/23. He does report symptoms of fatigue with exertion and intermittent dizziness.    Patient returns for follow up for atrial fibrillation and amiodarone monitoring. He remains in persistent afib with symptoms of fatigue with exertion and intermittent dizziness. He is scheduled for DCCV on 06/24/23. He denies any bleeding issues on anticoagulation.    Today, he denies symptoms of palpitations, chest pain, shortness of breath, orthopnea, PND, lower extremity edema, presyncope, syncope, snoring, daytime somnolence, bleeding, or  neurologic sequela. The patient is tolerating medications without difficulties and is otherwise without complaint today.          Past Medical History:  Diagnosis Date   Aortic regurgitation      a. mild AI by echo 01/2016.   Aortic root dilation (HCC)     Aortic valve regurgitation     Bifascicular block     Chronic systolic CHF (congestive heart failure) (HCC)     CKD (chronic kidney disease), stage III (HCC) 01/01/2016   Diabetes (HCC)     Ejection fraction < 50%     Frequent PVCs     Gilbert's syndrome     Hyperlipidemia     Mild diastolic dysfunction     Mitral regurgitation      a. mod by echo 10/207.   NICM (nonischemic cardiomyopathy) (HCC)     Pericardial effusion      a. small by echo 01/2016.   Pulmonary hypertension (HCC)     Right BBB/left post fasc block     Thrombocytopenia (HCC)                  Current Outpatient Medications  Medication Sig Dispense Refill   amiodarone (PACERONE) 200 MG tablet TAKE ONE TABLET BY MOUTH ONCE DAILY 30 tablet 0   atorvastatin (LIPITOR) 10 MG tablet Take 10 mg by mouth at bedtime.       bisoprolol (ZEBETA) 5 MG tablet TAKE ONE TABLET BY MOUTH DAILY 90 tablet 3  finasteride (PROSCAR) 5 MG tablet Take 5 mg by mouth daily.       furosemide (LASIX) 40 MG tablet Take 1 tablet (40 mg total) by mouth daily as needed for fluid. Must have office visit with Dr Anne Fu for further refills or obtain from PCP 90 tablet 0   hydrALAZINE (APRESOLINE) 25 MG tablet Take 25 mg by mouth 2 (two) times daily.       isosorbide mononitrate (IMDUR) 30 MG 24 hr tablet Take 30 mg by mouth daily.       warfarin (COUMADIN) 5 MG tablet TAKE 1/2 TO 1 TABLET BY MOUTH ONCE DAILY AS DIRECTED by anticoagulation clinic (Patient taking differently: Take 2.5-5 mg by mouth See admin instructions. Take 2.5 mg Tues., Thurs., and Sat. All the other days take 5 mg at bedtime) 30 tablet 5      No current facility-administered medications for this encounter.         ROS- All systems are reviewed and negative except as per the HPI above   Physical Exam:    Vitals:    06/18/23 0847  BP: 118/70  Pulse: 87  Weight: 75 kg  Height: 5\' 11"  (1.803 m)         Wt Readings from Last 3 Encounters:  06/18/23 75 kg  05/17/23 76.5 kg  04/29/23 80.7 kg      GEN: Well nourished, well developed in no acute distress NECK: No JVD; No carotid bruits CARDIAC: Regular rate and rhythm, no murmurs, rubs, gallops RESPIRATORY:  Clear to auscultation without rales, wheezing or rhonchi  ABDOMEN: Soft, non-tender, non-distended EXTREMITIES:  No edema; No deformity      EKG today demonstrates V pacing with underlying atypical atrial flutter Vent. rate 87 BPM PR interval * ms QRS duration 196 ms QT/QTcB 478/575 ms     Echo 11/06/21  1. Akinesis of the inferior and inferolateral walls and hypokinesis of  remaining walls; overall severe LV dysfunction.   2. Left ventricular ejection fraction, by estimation, is 25 to 30%. The  left ventricle has severely decreased function. The left ventricle  demonstrates regional wall motion abnormalities (see scoring  diagram/findings for description). The left  ventricular internal cavity size was severely dilated. Left ventricular  diastolic parameters are consistent with Grade II diastolic dysfunction  (pseudonormalization). Elevated left atrial pressure.   3. Right ventricular systolic function is mildly reduced. The right  ventricular size is mildly enlarged.   4. Left atrial size was severely dilated.   5. Right atrial size was moderately dilated.   6. A small pericardial effusion is present.   7. The mitral valve is normal in structure. Severe mitral valve  regurgitation. No evidence of mitral stenosis.   8. Tricuspid valve regurgitation is moderate.   9. The aortic valve is tricuspid. Aortic valve regurgitation is moderate  to severe. No aortic stenosis is present.  10. Aortic dilatation noted. There is borderline  dilatation of the aortic  root, measuring 39 mm.  11. The inferior vena cava is normal in size with greater than 50%  respiratory variability, suggesting right atrial pressure of 3 mmHg.        CHA2DS2-VASc Score = 5  The patient's score is based upon: CHF History: 1 HTN History: 1 Diabetes History: 1 Stroke History: 0 Vascular Disease History: 0 Age Score: 2 Gender Score: 0         ASSESSMENT AND PLAN: Persistent Atrial Fibrillation (ICD10:  I48.19) The patient's CHA2DS2-VASc score is 5, indicating  a 7.2% annual risk of stroke.   Persistent atrial flutter starting 03/22/23 Patient has had at least 4 therapeutic INR levels, scheduled for DCCV on 06/24/23. Check bmet/cbc today  Continue amiodarone 200 mg daily Continue bisoprolol 5 mg daily Continue warfarin   Secondary Hypercoagulable State (ICD10:  D68.69) The patient is at significant risk for stroke/thromboembolism based upon his CHA2DS2-VASc Score of 5.  Continue Warfarin (Coumadin). No bleeding issues.    High Risk Medication Monitoring (ICD 10: Z79.899) Intervals on ECG acceptable for amiodarone monitoring in setting of V pacing.    HTN Stable on current regimen   Chronic HFrEF EF 25-30%, s/p ICD GDMT per primary cardiology team Fluid status appears stable today     Follow up with Dr Elberta Fortis as scheduled.      Informed Consent Shared Decision Making/Informed Consent The risks (stroke, cardiac arrhythmias rarely resulting in the need for a temporary or permanent pacemaker, skin irritation or burns and complications associated with conscious sedation including aspiration, arrhythmia, respiratory failure and death), benefits (restoration of normal sinus rhythm) and alternatives of a direct current cardioversion were explained in detail to Mr. Piech and he agrees to proceed.          Jorja Loa PA-C Afib Clinic [facility] [address] [place], [place] [address] [phone]       For DCCV;  INR therapeutic since 05/06/23; no changes. [name]

## 2023-06-24 NOTE — Interval H&P Note (Signed)
 History and Physical Interval Note:  06/24/2023 10:37 AM  Carlos Gibson  has presented today for surgery, with the diagnosis of AFIB.  The various methods of treatment have been discussed with the patient and family. After consideration of risks, benefits and other options for treatment, the patient has consented to  Procedure(s): CARDIOVERSION (N/A) as a surgical intervention.  The patient's history has been reviewed, patient examined, no change in status, stable for surgery.  I have reviewed the patient's chart and labs.  Questions were answered to the patient's satisfaction.     [name]

## 2023-06-24 NOTE — Transfer of Care (Signed)
 Immediate Anesthesia Transfer of Care Note  Patient: Carlos Gibson  Procedure(s) Performed: CARDIOVERSION  Patient Location: PACU  Anesthesia Type:MAC  Level of Consciousness: awake and sedated  Airway & Oxygen Therapy: Patient Spontanous Breathing and Patient connected to nasal cannula oxygen  Post-op Assessment: Report given to RN and Post -op Vital signs reviewed and stable  Post vital signs: Reviewed and stable  Last Vitals:  Vitals Value Taken Time  BP    Temp    Pulse    Resp    SpO2      Last Pain:  Vitals:   06/24/23 1009  TempSrc: Temporal         Complications: No notable events documented.

## 2023-06-24 NOTE — Anesthesia Preprocedure Evaluation (Addendum)
 Anesthesia Evaluation  Patient identified by MRN, date of birth, ID band Patient awake    Reviewed: Allergy & Precautions, NPO status , Patient's Chart, lab work & pertinent test results, reviewed documented beta blocker date and time   Airway Mallampati: II  TM Distance: >3 FB Neck ROM: Full    Dental  (+) Edentulous Upper, Upper Dentures, Dental Advisory Given   Pulmonary neg pulmonary ROS   Pulmonary exam normal breath sounds clear to auscultation       Cardiovascular hypertension, Pt. on home beta blockers and Pt. on medications pulmonary hypertension+CHF  Normal cardiovascular exam+ dysrhythmias Atrial Fibrillation + Cardiac Defibrillator + Valvular Problems/Murmurs AI and MR  Rhythm:Irregular Rate:Normal  TTE 2023 1. Akinesis of the inferior and inferolateral walls and hypokinesis of  remaining walls; overall severe LV dysfunction.   2. Left ventricular ejection fraction, by estimation, is 25 to 30%. The  left ventricle has severely decreased function. The left ventricle  demonstrates regional wall motion abnormalities (see scoring  diagram/findings for description). The left  ventricular internal cavity size was severely dilated. Left ventricular  diastolic parameters are consistent with Grade II diastolic dysfunction  (pseudonormalization). Elevated left atrial pressure.   3. Right ventricular systolic function is mildly reduced. The right  ventricular size is mildly enlarged.   4. Left atrial size was severely dilated.   5. Right atrial size was moderately dilated.   6. A small pericardial effusion is present.   7. The mitral valve is normal in structure. Severe mitral valve  regurgitation. No evidence of mitral stenosis.   8. Tricuspid valve regurgitation is moderate.   9. The aortic valve is tricuspid. Aortic valve regurgitation is moderate  to severe. No aortic stenosis is present.  10. Aortic dilatation noted.  There is borderline dilatation of the aortic  root, measuring 39 mm.  11. The inferior vena cava is normal in size with greater than 50%  respiratory variability, suggesting right atrial pressure of 3 mmHg.     Neuro/Psych negative neurological ROS  negative psych ROS   GI/Hepatic negative GI ROS, Neg liver ROS,,,  Endo/Other  diabetes    Renal/GU Renal InsufficiencyRenal disease  negative genitourinary   Musculoskeletal negative musculoskeletal ROS (+)    Abdominal   Peds  Hematology  (+) Blood dyscrasia (coumadin)   Anesthesia Other Findings   Reproductive/Obstetrics                             Anesthesia Physical Anesthesia Plan  ASA: 4  Anesthesia Plan: General   Post-op Pain Management:    Induction: Intravenous  PONV Risk Score and Plan: Propofol infusion and Treatment may vary due to age or medical condition  Airway Management Planned: Natural Airway  Additional Equipment:   Intra-op Plan:   Post-operative Plan:   Informed Consent: I have reviewed the patients History and Physical, chart, labs and discussed the procedure including the risks, benefits and alternatives for the proposed anesthesia with the patient or authorized representative who has indicated his/her understanding and acceptance.     Dental advisory given  Plan Discussed with: CRNA  Anesthesia Plan Comments:        Anesthesia Quick Evaluation

## 2023-06-24 NOTE — Procedures (Signed)
 Electrical Cardioversion Procedure Note [name] [id number] March 31, 1941  Procedure: Electrical Cardioversion Indications:  Atrial Fibrillation  Procedure Details Consent: Risks of procedure as well as the alternatives and risks of each were explained to the (patient/caregiver).  Consent for procedure obtained. Time Out: Verified patient identification, verified procedure, site/side was marked, verified correct patient position, special equipment/implants available, medications/allergies/relevent history reviewed, required imaging and test results available.  Performed  Patient placed on cardiac monitor, pulse oximetry, supplemental oxygen as necessary.  Sedation given:  Pt sedated by anesthesia with lidocaine 50 mg and diprovan 50 mg IV. Pacer pads placed anterior and posterior chest.  Cardioverted 1 time(s).  Cardioverted at 200J.  Evaluation Findings: Post procedure EKG shows: AV paced rhythm. Complications: None Patient did tolerate procedure well.   Carlos Gibson 06/24/2023, 10:36 AM

## 2023-06-25 ENCOUNTER — Encounter (HOSPITAL_COMMUNITY): Payer: Self-pay | Admitting: Cardiology

## 2023-06-25 ENCOUNTER — Other Ambulatory Visit: Payer: Self-pay

## 2023-06-25 DIAGNOSIS — I4819 Other persistent atrial fibrillation: Secondary | ICD-10-CM

## 2023-06-25 MED ORDER — WARFARIN SODIUM 5 MG PO TABS: ORAL_TABLET | ORAL | 5 refills | Status: DC

## 2023-06-25 NOTE — Anesthesia Postprocedure Evaluation (Signed)
 Anesthesia Post Note  Patient: Carlos Gibson  Procedure(s) Performed: CARDIOVERSION     Patient location during evaluation: Cath Lab Anesthesia Type: General Level of consciousness: awake and alert Pain management: pain level controlled Vital Signs Assessment: post-procedure vital signs reviewed and stable Respiratory status: spontaneous breathing, nonlabored ventilation, respiratory function stable and patient connected to nasal cannula oxygen Cardiovascular status: blood pressure returned to baseline and stable Postop Assessment: no apparent nausea or vomiting Anesthetic complications: no  No notable events documented.  Last Vitals:  Vitals:   06/24/23 1210 06/24/23 1217  BP: 113/64 (!) 119/49  Pulse: 70 68  Resp: 17 14  Temp:    SpO2: 99% 95%    Last Pain:  Vitals:   06/24/23 1147  TempSrc: Temporal  PainSc: 0-No pain                 Sherica Paternostro L Lonnette Shrode

## 2023-06-30 ENCOUNTER — Ambulatory Visit: Attending: Cardiology

## 2023-06-30 DIAGNOSIS — Z7901 Long term (current) use of anticoagulants: Secondary | ICD-10-CM | POA: Insufficient documentation

## 2023-06-30 DIAGNOSIS — I483 Typical atrial flutter: Secondary | ICD-10-CM | POA: Insufficient documentation

## 2023-06-30 LAB — POCT INR: INR: 1.8 — AB (ref 2.0–3.0)

## 2023-06-30 NOTE — Patient Instructions (Addendum)
 Description   Take 1 tablet today and 1 tablet tomorrow and then continue taking warfarin 1/2 tablet daily except for 1 tablet on Tuesdays and Saturdays.  Recheck INR in 2 weeks.  Stay consistent with greens each week. Coumadin Clinic [phone].

## 2023-07-05 DIAGNOSIS — I483 Typical atrial flutter: Secondary | ICD-10-CM | POA: Diagnosis not present

## 2023-07-05 DIAGNOSIS — N1832 Chronic kidney disease, stage 3b: Secondary | ICD-10-CM | POA: Diagnosis not present

## 2023-07-05 DIAGNOSIS — E7800 Pure hypercholesterolemia, unspecified: Secondary | ICD-10-CM | POA: Diagnosis not present

## 2023-07-05 DIAGNOSIS — I5020 Unspecified systolic (congestive) heart failure: Secondary | ICD-10-CM | POA: Diagnosis not present

## 2023-07-14 ENCOUNTER — Encounter: Payer: Medicare Other | Admitting: Cardiology

## 2023-07-14 ENCOUNTER — Ambulatory Visit

## 2023-07-16 ENCOUNTER — Ambulatory Visit: Attending: Internal Medicine

## 2023-07-16 DIAGNOSIS — Z7901 Long term (current) use of anticoagulants: Secondary | ICD-10-CM | POA: Insufficient documentation

## 2023-07-16 DIAGNOSIS — Z79899 Other long term (current) drug therapy: Secondary | ICD-10-CM

## 2023-07-16 DIAGNOSIS — I483 Typical atrial flutter: Secondary | ICD-10-CM | POA: Insufficient documentation

## 2023-07-16 DIAGNOSIS — Z5181 Encounter for therapeutic drug level monitoring: Secondary | ICD-10-CM

## 2023-07-16 LAB — POCT INR: INR: 2.2 (ref 2.0–3.0)

## 2023-07-16 NOTE — Patient Instructions (Signed)
 continue taking warfarin 1/2 tablet daily except for 1 tablet on Tuesdays and Saturdays.  Recheck INR in 6 weeks. Stay consistent with greens each week. Coumadin Clinic [phone].

## 2023-08-01 NOTE — Progress Notes (Unsigned)
 Electrophysiology Office Note:   Date:  08/02/2023  ID:  Carlos Gibson, DOB January 19, 1941, MRN [id number]  Primary Cardiologist: Dorothye Gathers, MD Primary Heart Failure: None Electrophysiologist: Arasely Akkerman Cortland Ding, MD  Dizzy need annual labs.  History of Present Illness:   Carlos Gibson is a 83 y.o. male with h/o chronic systolic heart failure, PVCs, CKD stage III, Gill Bears syndrome, atrial fibrillation/flutter, diabetes seen today for routine electrophysiology followup.   Since last being seen in our clinic the patient reports doing well.  He has no chest pain or shortness of breath.  He is able to do all of his daily activities.  He feels mildly dizzy today, but this is abnormal for him.  He is able to do all of his daily activities without issues.  he denies chest pain, palpitations, dyspnea, PND, orthopnea, nausea, vomiting, dizziness, syncope, edema, weight gain, or early satiety.   Review of systems complete and found to be negative unless listed in HPI.      EP Information / Studies Reviewed:    EKG is ordered today. Personal review as below.  EKG Interpretation Date/Time:  Monday August 02 2023 14:34:35 EDT Ventricular Rate:  75 PR Interval:  166 QRS Duration:  188 QT Interval:  528 QTC Calculation: 589 R Axis:   -68  Text Interpretation: AV dual-paced rhythm with frequent ventricular-paced complexes and with occasional Premature ventricular complexes When compared with ECG of 24-Jun-2023 11:59, Premature ventricular complexes are now Present Vent. rate has increased BY   5 BPM Confirmed by Edwyna Dangerfield ([id number]) on 08/02/2023 2:37:53 PM   ICD Interrogation-  reviewed in detail today,  See PACEART report.  Device History: Medtronic BiV ICD implanted 04/28/2017 for chronic systolic heart failure History of appropriate therapy: No History of AAD therapy: Yes; currently on amiodarone     Risk Assessment/Calculations:    CHA2DS2-VASc Score = 5   This indicates a 7.2% annual  risk of stroke. The patient's score is based upon: CHF History: 1 HTN History: 1 Diabetes History: 1 Stroke History: 0 Vascular Disease History: 0 Age Score: 2 Gender Score: 0            Physical Exam:   VS:  BP 104/60 (BP Location: Right Arm, Patient Position: Sitting, Cuff Size: Normal)   Pulse 75   Ht 5\' 11"  (1.803 m)   Wt 178 lb (80.7 kg)   SpO2 98%   BMI 24.83 kg/m    Wt Readings from Last 3 Encounters:  08/02/23 178 lb (80.7 kg)  06/18/23 165 lb 6.4 oz (75 kg)  05/17/23 168 lb 9.6 oz (76.5 kg)     GEN: Well nourished, well developed in no acute distress NECK: No JVD; No carotid bruits CARDIAC: Regular rate and rhythm, no murmurs, rubs, gallops RESPIRATORY:  Clear to auscultation without rales, wheezing or rhonchi  ABDOMEN: Soft, non-tender, non-distended EXTREMITIES:  No edema; No deformity   ASSESSMENT AND PLAN:    Chronic systolic dysfunction s/p Medtronic CRT-D  euvolemic today Stable on an appropriate medical regimen Normal ICD function See Pace Art report Sensing, threshold, impedance within normal limits Programming reviewed and stable for patient No changes today  2.  Persistent atrial fibrillation/flutter: On amiodarone .  Recent cardioversion.  He has been doing well without complaint.  Ahmaya Ostermiller continue with current management.  3.  Secondary hypercoagulable state: On Eliquis  for atrial fibrillation  4.  High risk medication monitoring: On amiodarone .  LFTs stable.  TSH elevated chronically.  Karley Pho need follow-up with  primary physician.  Disposition:   Follow up with EP APP in 6 months   Signed, Rumeal Cullipher Cortland Ding, MD

## 2023-08-02 ENCOUNTER — Encounter: Payer: Self-pay | Admitting: Cardiology

## 2023-08-02 ENCOUNTER — Ambulatory Visit: Payer: Medicare Other | Attending: Cardiology | Admitting: Cardiology

## 2023-08-02 VITALS — BP 104/60 | HR 75 | Ht 71.0 in | Wt 178.0 lb

## 2023-08-02 DIAGNOSIS — I4510 Unspecified right bundle-branch block: Secondary | ICD-10-CM | POA: Insufficient documentation

## 2023-08-02 DIAGNOSIS — Z79899 Other long term (current) drug therapy: Secondary | ICD-10-CM | POA: Insufficient documentation

## 2023-08-02 DIAGNOSIS — I472 Ventricular tachycardia, unspecified: Secondary | ICD-10-CM | POA: Insufficient documentation

## 2023-08-02 DIAGNOSIS — I4819 Other persistent atrial fibrillation: Secondary | ICD-10-CM | POA: Insufficient documentation

## 2023-08-02 DIAGNOSIS — D6869 Other thrombophilia: Secondary | ICD-10-CM | POA: Diagnosis not present

## 2023-08-02 DIAGNOSIS — I483 Typical atrial flutter: Secondary | ICD-10-CM | POA: Insufficient documentation

## 2023-08-02 DIAGNOSIS — I493 Ventricular premature depolarization: Secondary | ICD-10-CM | POA: Insufficient documentation

## 2023-08-02 MED ORDER — AMIODARONE HCL 200 MG PO TABS: 200.0000 mg | ORAL_TABLET | Freq: Every day | ORAL | 11 refills | Status: AC

## 2023-08-02 NOTE — Patient Instructions (Addendum)
 Medication Instructions:  Your physician recommends that you continue on your current medications as directed. Please refer to the Current Medication list given to you today.  *If you need a refill on your cardiac medications before your next appointment, please call your pharmacy*   Lab Work: None ordered   Testing/Procedures: None ordered   Follow-Up: At [facility], you and your health needs are our priority.  As part of our continuing mission to provide you with exceptional heart care, we have created designated Provider Care Teams.  These Care Teams include your primary Cardiologist (physician) and Advanced Practice Providers (APPs -  Physician Assistants and Nurse Practitioners) who all work together to provide you with the care you need, when you need it.  We recommend signing up for the patient portal called "MyChart".  Sign up information is provided on this After Visit Summary.  MyChart is used to connect with patients for Virtual Visits (Telemedicine).  Patients are able to view lab/test results, encounter notes, upcoming appointments, etc.  Non-urgent messages can be sent to your provider as well.   To learn more about what you can do with MyChart, go to [email or web address].    Remote monitoring is used to monitor your Pacemaker or ICD from home. This monitoring reduces the number of office visits required to check your device to one time per year. It allows us  to keep an eye on the functioning of your device to ensure it is working properly. You are scheduled for a device check from home on 08/06/23. You may send your transmission at any time that day. If you have a wireless device, the transmission will be sent automatically. After your physician reviews your transmission, you will receive a postcard with your next transmission date.  Your next appointment:   6 month(s)  The format for your next appointment:   In Person  Provider:   You will see one of the following  Advanced Practice Providers on your designated Care Team:   Mertha Abrahams, Kennard Pea [address] [place], PA-C Suzann Riddle, NP Creighton Doffing, NP    Thank you for choosing [facility]!!   Reece Cane, RN [phone]

## 2023-08-06 ENCOUNTER — Ambulatory Visit (INDEPENDENT_AMBULATORY_CARE_PROVIDER_SITE_OTHER): Payer: Medicare Other

## 2023-08-06 DIAGNOSIS — I4510 Unspecified right bundle-branch block: Secondary | ICD-10-CM | POA: Diagnosis not present

## 2023-08-06 LAB — CUP PACEART REMOTE DEVICE CHECK
Battery Remaining Longevity: 92 mo
Battery Voltage: 3.04 V
Brady Statistic AP VP Percent: 83.85 %
Brady Statistic AP VS Percent: 0.26 %
Brady Statistic AS VP Percent: 14.72 %
Brady Statistic AS VS Percent: 1.17 %
Brady Statistic RA Percent Paced: 84.46 %
Brady Statistic RV Percent Paced: 98.56 %
Date Time Interrogation Session: 20250502143530
HighPow Impedance: 62 Ohm
Implantable Lead Connection Status: 753985
Implantable Lead Connection Status: 753985
Implantable Lead Connection Status: 753985
Implantable Lead Implant Date: 20191023
Implantable Lead Implant Date: 20191023
Implantable Lead Implant Date: 20191023
Implantable Lead Location: 753858
Implantable Lead Location: 753859
Implantable Lead Location: 753860
Implantable Lead Model: 4598
Implantable Lead Model: 5076
Implantable Lead Model: 6935
Implantable Pulse Generator Implant Date: 20250123
Lead Channel Impedance Value: 304 Ohm
Lead Channel Impedance Value: 361 Ohm
Lead Channel Impedance Value: 380 Ohm
Lead Channel Impedance Value: 380 Ohm
Lead Channel Impedance Value: 399 Ohm
Lead Channel Impedance Value: 437 Ohm
Lead Channel Impedance Value: 456 Ohm
Lead Channel Impedance Value: 608 Ohm
Lead Channel Impedance Value: 665 Ohm
Lead Channel Impedance Value: 684 Ohm
Lead Channel Impedance Value: 684 Ohm
Lead Channel Impedance Value: 741 Ohm
Lead Channel Impedance Value: 760 Ohm
Lead Channel Pacing Threshold Amplitude: 0.625 V
Lead Channel Pacing Threshold Amplitude: 0.875 V
Lead Channel Pacing Threshold Amplitude: 1.25 V
Lead Channel Pacing Threshold Pulse Width: 0.4 ms
Lead Channel Pacing Threshold Pulse Width: 0.4 ms
Lead Channel Pacing Threshold Pulse Width: 0.8 ms
Lead Channel Sensing Intrinsic Amplitude: 3.6 mV
Lead Channel Sensing Intrinsic Amplitude: 8 mV
Lead Channel Setting Pacing Amplitude: 1.5 V
Lead Channel Setting Pacing Amplitude: 2 V
Lead Channel Setting Pacing Amplitude: 2.5 V
Lead Channel Setting Pacing Pulse Width: 0.4 ms
Lead Channel Setting Pacing Pulse Width: 0.8 ms
Lead Channel Setting Sensing Sensitivity: 0.3 mV
Zone Setting Status: 755011
Zone Setting Status: 755011

## 2023-08-12 DIAGNOSIS — I129 Hypertensive chronic kidney disease with stage 1 through stage 4 chronic kidney disease, or unspecified chronic kidney disease: Secondary | ICD-10-CM | POA: Diagnosis not present

## 2023-08-12 DIAGNOSIS — I10 Essential (primary) hypertension: Secondary | ICD-10-CM | POA: Diagnosis not present

## 2023-08-12 DIAGNOSIS — E1122 Type 2 diabetes mellitus with diabetic chronic kidney disease: Secondary | ICD-10-CM | POA: Diagnosis not present

## 2023-08-12 DIAGNOSIS — R319 Hematuria, unspecified: Secondary | ICD-10-CM | POA: Diagnosis not present

## 2023-08-12 DIAGNOSIS — N2581 Secondary hyperparathyroidism of renal origin: Secondary | ICD-10-CM | POA: Diagnosis not present

## 2023-08-12 DIAGNOSIS — N1832 Chronic kidney disease, stage 3b: Secondary | ICD-10-CM | POA: Diagnosis not present

## 2023-08-12 DIAGNOSIS — N189 Chronic kidney disease, unspecified: Secondary | ICD-10-CM | POA: Diagnosis not present

## 2023-08-12 DIAGNOSIS — D631 Anemia in chronic kidney disease: Secondary | ICD-10-CM | POA: Diagnosis not present

## 2023-08-12 DIAGNOSIS — I5020 Unspecified systolic (congestive) heart failure: Secondary | ICD-10-CM | POA: Diagnosis not present

## 2023-08-12 DIAGNOSIS — E1129 Type 2 diabetes mellitus with other diabetic kidney complication: Secondary | ICD-10-CM | POA: Diagnosis not present

## 2023-08-27 ENCOUNTER — Ambulatory Visit: Attending: Internal Medicine | Admitting: *Deleted

## 2023-08-27 DIAGNOSIS — I483 Typical atrial flutter: Secondary | ICD-10-CM | POA: Insufficient documentation

## 2023-08-27 DIAGNOSIS — Z5181 Encounter for therapeutic drug level monitoring: Secondary | ICD-10-CM | POA: Diagnosis not present

## 2023-08-27 DIAGNOSIS — Z79899 Other long term (current) drug therapy: Secondary | ICD-10-CM | POA: Insufficient documentation

## 2023-08-27 DIAGNOSIS — Z7901 Long term (current) use of anticoagulants: Secondary | ICD-10-CM | POA: Diagnosis not present

## 2023-08-27 LAB — POCT INR: INR: 1.3 — AB (ref 2.0–3.0)

## 2023-08-27 NOTE — Patient Instructions (Signed)
 Description   Today take 1 tablet of warfarin and tomorrow take 1.5 tablets of warfarin then continue taking warfarin 1/2 tablet daily except for 1 tablet on Tuesdays and Saturdays.  Recheck INR in 1 week. Stay consistent with greens each week. Coumadin  Clinic [phone].

## 2023-09-03 ENCOUNTER — Ambulatory Visit: Attending: Internal Medicine

## 2023-09-03 ENCOUNTER — Other Ambulatory Visit: Payer: Self-pay | Admitting: Cardiology

## 2023-09-03 DIAGNOSIS — Z7901 Long term (current) use of anticoagulants: Secondary | ICD-10-CM | POA: Diagnosis not present

## 2023-09-03 DIAGNOSIS — I483 Typical atrial flutter: Secondary | ICD-10-CM | POA: Diagnosis not present

## 2023-09-03 LAB — POCT INR: INR: 1.8 — AB (ref 2.0–3.0)

## 2023-09-03 NOTE — Patient Instructions (Addendum)
 Description   Today take 1 tablet of warfarin and then START taking warfarin 1/2 tablet daily except for 1 tablet on Tuesdays, Thursdays, and Saturdays.  Recheck INR in 2 weeks. Stay consistent with greens each week. Coumadin  Clinic [phone].

## 2023-09-15 ENCOUNTER — Ambulatory Visit

## 2023-09-15 NOTE — Progress Notes (Signed)
 Remote ICD transmission.

## 2023-09-23 ENCOUNTER — Ambulatory Visit: Attending: Internal Medicine | Admitting: *Deleted

## 2023-09-23 DIAGNOSIS — I483 Typical atrial flutter: Secondary | ICD-10-CM | POA: Diagnosis not present

## 2023-09-23 DIAGNOSIS — Z5181 Encounter for therapeutic drug level monitoring: Secondary | ICD-10-CM | POA: Diagnosis not present

## 2023-09-23 DIAGNOSIS — Z7901 Long term (current) use of anticoagulants: Secondary | ICD-10-CM

## 2023-09-23 LAB — POCT INR: POC INR: 2.5

## 2023-09-23 NOTE — Patient Instructions (Addendum)
 Description   Continue taking warfarin 1/2 tablet daily except for 1 tablet on Tuesdays, Thursdays, and Saturdays.  Recheck INR in 4 weeks. Stay consistent with greens each week. Coumadin  Clinic [phone].

## 2023-09-23 NOTE — Progress Notes (Signed)
Please see anticoagulation encounter.

## 2023-10-04 ENCOUNTER — Other Ambulatory Visit: Payer: Self-pay | Admitting: Cardiology

## 2023-10-05 ENCOUNTER — Telehealth: Payer: Self-pay | Admitting: Cardiology

## 2023-10-05 NOTE — Telephone Encounter (Signed)
 Called and spoke with patient who states about a week ago, he developed SOB w/exertion. Sitting is fine. No cough, no CP, no nasal congestion or pressure. He would like an inhaler, but educated him that it likely wouldn't help, or only minimally. His last ECHO was 11/2021 and showed severe MR. Educated that this likely is the cause and it may be time to repeat the ECHO and look at his valve. Will route to [place], MD for advisement.

## 2023-10-05 NOTE — Telephone Encounter (Signed)
 Pt c/o Shortness Of Breath: STAT if SOB developed within the last 24 hours or pt is noticeably SOB on the phone  1. Are you currently SOB (can you hear that pt is SOB on the phone)? No  2. How long have you been experiencing SOB? Started last week  3. Are you SOB when sitting or when up moving around? Moving around   4. Are you currently experiencing any other symptoms? N/a  Patient is requesting to be prescribed an inhaler. Patient stated he can't walk 5 step without getting out of breath. Please advise.

## 2023-10-06 NOTE — Telephone Encounter (Signed)
 Spoke with pt who reports he feels alittle better today and that he can walk about 25 steps before having shortness of breath.  He reports he has been taking his Furosemide  40 mg daily for 3 weeks now and no improvement.  Attempted to scheduled him in office for tomorrow and pt reports he is going out of town for the holiday and will not be back before Monday.  Advised pt to continue taking medication as instructed and limit his sodium intake as much as possible over the next several days.  Advised strongly against eating out esp at fast food or family gatherings where he does know how much NA+ things were cooked with.  He states understanding.  Pt is aware I will forward this information to Dr Jeffrie for his knowledge and will call back with any other orders.  Will also look for a appt for him for next week.

## 2023-10-06 NOTE — Telephone Encounter (Signed)
 Patient was returning call. Please advise ?

## 2023-10-06 NOTE — Telephone Encounter (Signed)
 Pt calling again, requesting cb

## 2023-10-06 NOTE — Telephone Encounter (Signed)
 Attempted to contact pt at home number.  No answer and voicemail if full.  Will attempt to contact again in the morning.

## 2023-10-13 NOTE — Telephone Encounter (Signed)
 Called to follow up with pt who reports he is feeling much better.  His shortness of breath is better.  The only concern/complaint he is having now is that he had blood in his urine yesterday x 1.  None today.  He denies any other symptoms but will continue to monitor.  He did just return from his trip yesterday.  Advised if hematuria returns to contact PCP for urinalysis and further evaluation/treatment.  Also advised if bleeding reoccurs, he should contact the Anti-coag clinic to have his INR checked.  He states understanding and will call back if any further questions or concerns.

## 2023-10-21 ENCOUNTER — Ambulatory Visit: Attending: Internal Medicine | Admitting: *Deleted

## 2023-10-21 DIAGNOSIS — Z7901 Long term (current) use of anticoagulants: Secondary | ICD-10-CM | POA: Diagnosis not present

## 2023-10-21 DIAGNOSIS — I483 Typical atrial flutter: Secondary | ICD-10-CM | POA: Diagnosis not present

## 2023-10-21 DIAGNOSIS — Z5181 Encounter for therapeutic drug level monitoring: Secondary | ICD-10-CM | POA: Diagnosis not present

## 2023-10-21 LAB — POCT INR: POC INR: 3.6

## 2023-10-21 NOTE — Progress Notes (Signed)
Please see anticoagulation encounter.

## 2023-10-21 NOTE — Patient Instructions (Signed)
 Description   Hold warfarin today and then continue taking warfarin 1/2 tablet daily except for 1 tablet on Tuesdays, Thursdays, and Saturdays.  Recheck INR in 3 weeks. Stay consistent with greens each week. Coumadin  Clinic [phone].

## 2023-10-22 ENCOUNTER — Telehealth: Payer: Self-pay | Admitting: Cardiology

## 2023-10-22 ENCOUNTER — Telehealth: Payer: Self-pay | Admitting: *Deleted

## 2023-10-22 NOTE — Telephone Encounter (Signed)
 Occurring for the last week and half.  Pt advised to send in a transmission to see if he is experiencing abn rhythm/elevated rates.  Pt agreeable to plan. Forwarding to device clinic to look out for transmission and let me know findings....SABRASABRA

## 2023-10-22 NOTE — Telephone Encounter (Addendum)
 Called patient back to assess symptoms and after reviewing unscheduled remote transmission in Paceart:  Optivol status indicates probable fluid accumulation with trend line above threshold and thoracic impedance trend below reference line  Patient stated that because of his SOB symptoms he took, 2 of my fluid pills today.   This RN asked if he had been urinating after taking the extra Lasix . Patient confirmed an increase in urination volume and frequency and then stated that he was feeling better already.   This RN advised patient to limit his fluid intake to 1, 8 oz glass of water for the rest of the evening if possible and to also avoid processed foods over the weekend that contain lots of sodium which could worsen his symptoms  Patient told to call 911 if his SOB symptoms worsen over the weekend, and don't improve after taking an additional fluid pill because it could be signs that he is having a heart failure exacerbation and needs emergent medical attention.  Patient verbalized understanding of all the instructions and education provided to him by this RN and was appreciative of the call back  All questions and concerns addressed at this time. DW, RN  Unscheduled remote transmission: PII, c/o SOB Normal device function.  AFL in progress from 7/11, overall controlled rates, burden 46.8%, Warfarin per EPIC - route to triage HF diagnostics currenlty abnormal correlating with onset of AFL _________________________________________________________________________

## 2023-10-22 NOTE — Telephone Encounter (Signed)
 Entered in error

## 2023-10-22 NOTE — Telephone Encounter (Signed)
 Pt c/o Shortness Of Breath: STAT if SOB developed within the last 24 hours or pt is noticeably SOB on the phone  1. Are you currently SOB (can you hear that pt is SOB on the phone)? no  2. How long have you been experiencing SOB? Patient states for the last 4-5 days  3. Are you SOB when sitting or when up moving around? both  4. Are you currently experiencing any other symptoms? Patient states his heart is beating fast, but his hasn't check his heart rate.  He said he can feel it beating fast.

## 2023-10-25 ENCOUNTER — Ambulatory Visit: Admitting: Physician Assistant

## 2023-11-05 ENCOUNTER — Ambulatory Visit (INDEPENDENT_AMBULATORY_CARE_PROVIDER_SITE_OTHER): Payer: Medicare Other

## 2023-11-05 DIAGNOSIS — I483 Typical atrial flutter: Secondary | ICD-10-CM | POA: Diagnosis not present

## 2023-11-05 LAB — CUP PACEART REMOTE DEVICE CHECK
Battery Remaining Longevity: 90 mo
Battery Voltage: 2.99 V
Brady Statistic RV Percent Paced: 98.7 %
Date Time Interrogation Session: 20250801043912
HighPow Impedance: 58 Ohm
Implantable Lead Connection Status: 753985
Implantable Lead Connection Status: 753985
Implantable Lead Connection Status: 753985
Implantable Lead Implant Date: 20191023
Implantable Lead Implant Date: 20191023
Implantable Lead Implant Date: 20191023
Implantable Lead Location: 753858
Implantable Lead Location: 753859
Implantable Lead Location: 753860
Implantable Lead Model: 4598
Implantable Lead Model: 5076
Implantable Lead Model: 6935
Implantable Pulse Generator Implant Date: 20250123
Lead Channel Impedance Value: 285 Ohm
Lead Channel Impedance Value: 361 Ohm
Lead Channel Impedance Value: 361 Ohm
Lead Channel Impedance Value: 418 Ohm
Lead Channel Impedance Value: 418 Ohm
Lead Channel Impedance Value: 456 Ohm
Lead Channel Impedance Value: 456 Ohm
Lead Channel Impedance Value: 627 Ohm
Lead Channel Impedance Value: 741 Ohm
Lead Channel Impedance Value: 760 Ohm
Lead Channel Impedance Value: 760 Ohm
Lead Channel Impedance Value: 817 Ohm
Lead Channel Impedance Value: 855 Ohm
Lead Channel Pacing Threshold Amplitude: 0.75 V
Lead Channel Pacing Threshold Amplitude: 0.875 V
Lead Channel Pacing Threshold Amplitude: 1 V
Lead Channel Pacing Threshold Pulse Width: 0.4 ms
Lead Channel Pacing Threshold Pulse Width: 0.4 ms
Lead Channel Pacing Threshold Pulse Width: 0.8 ms
Lead Channel Sensing Intrinsic Amplitude: 2.9 mV
Lead Channel Sensing Intrinsic Amplitude: 24 mV
Lead Channel Setting Pacing Amplitude: 1.5 V
Lead Channel Setting Pacing Amplitude: 2 V
Lead Channel Setting Pacing Amplitude: 2.5 V
Lead Channel Setting Pacing Pulse Width: 0.4 ms
Lead Channel Setting Pacing Pulse Width: 0.8 ms
Lead Channel Setting Sensing Sensitivity: 0.3 mV
Zone Setting Status: 755011
Zone Setting Status: 755011

## 2023-11-06 ENCOUNTER — Ambulatory Visit: Payer: Self-pay | Admitting: Cardiology

## 2023-11-10 ENCOUNTER — Ambulatory Visit (HOSPITAL_COMMUNITY)
Admission: RE | Admit: 2023-11-10 | Discharge: 2023-11-10 | Disposition: A | Discharge status: Home / Self Care | Source: Ambulatory Visit | Attending: Internal Medicine | Admitting: Internal Medicine

## 2023-11-10 ENCOUNTER — Encounter (HOSPITAL_COMMUNITY): Payer: Self-pay | Admitting: Internal Medicine

## 2023-11-10 VITALS — BP 114/70 | HR 79 | Ht 71.0 in | Wt 160.8 lb

## 2023-11-10 DIAGNOSIS — I484 Atypical atrial flutter: Secondary | ICD-10-CM | POA: Diagnosis not present

## 2023-11-10 DIAGNOSIS — D6869 Other thrombophilia: Secondary | ICD-10-CM

## 2023-11-10 DIAGNOSIS — Z79899 Other long term (current) drug therapy: Secondary | ICD-10-CM

## 2023-11-10 DIAGNOSIS — Z5181 Encounter for therapeutic drug level monitoring: Secondary | ICD-10-CM | POA: Diagnosis not present

## 2023-11-10 NOTE — Progress Notes (Signed)
 Primary Care Physician: Charlott Dorn LABOR, MD Referring Physician: Dr. Inocencio EP: Dr. Inocencio Lytle Carlos Gibson is a 83 y.o. male with a history of Chronic systolic CHF, conduction system disease (trifascicular block), CKD, Gilbert's syndrome, ICD, Afib/flutter, DM who presents for follow up in the [facility] Atrial Fibrillation clinic. Patient has been maintained on amiodarone  for rhythm control. He underwent ICD replacement on 04/29/23 with Dr Inocencio. His device has shown persistent afib since 03/22/23. He does report symptoms of fatigue with exertion and intermittent dizziness.   Patient returns for follow up for atrial fibrillation and amiodarone  monitoring. He remains in persistent afib with symptoms of fatigue with exertion and intermittent dizziness. He is scheduled for DCCV on 06/24/23. He denies any bleeding issues on anticoagulation.   On follow up 11/10/23, patient is currently in V paced rhythm. Device interrogation by Dr. Inocencio on 8/2 showed patient is in Afib. He takes amiodarone  200 mg once daily. He is on coumadin . Patient has noted recently he has been having hematuria; lately he also noted to be urinating blood clots.   Today, he denies symptoms of palpitations, chest pain, shortness of breath, orthopnea, PND, lower extremity edema, presyncope, syncope, snoring, daytime somnolence, bleeding, or neurologic sequela. The patient is tolerating medications without difficulties and is otherwise without complaint today.    Past Medical History:  Diagnosis Date   Aortic regurgitation    a. mild AI by echo 01/2016.   Aortic root dilation (HCC)    Aortic valve regurgitation    Bifascicular block    Chronic systolic CHF (congestive heart failure) (HCC)    CKD (chronic kidney disease), stage III (HCC) 01/01/2016   Diabetes (HCC)    Ejection fraction < 50%    Frequent PVCs    Gilbert's syndrome    Hyperlipidemia    Mild diastolic dysfunction    Mitral regurgitation    a. mod  by echo 10/207.   NICM (nonischemic cardiomyopathy) (HCC)    Pericardial effusion    a. small by echo 01/2016.   Pulmonary hypertension (HCC)    Right BBB/left post fasc block    Thrombocytopenia (HCC)     Current Outpatient Medications  Medication Sig Dispense Refill   amiodarone  (PACERONE ) 200 MG tablet Take 1 tablet (200 mg total) by mouth daily. 30 tablet 11   atorvastatin  (LIPITOR) 10 MG tablet Take 10 mg by mouth at bedtime.     bisoprolol  (ZEBETA ) 5 MG tablet TAKE 1 TABLET BY MOUTH ONCE DAILY *PATIENT NEEDS APPOINTMENT FOR FURTHER REFILLS [phone]* 30 tablet 5   cholecalciferol  (VITAMIN D3) 25 MCG (1000 UNIT) tablet Take 1,000 Units by mouth daily.     cyanocobalamin (VITAMIN B12) 1000 MCG tablet Take 1,000 mcg by mouth daily.     finasteride  (PROSCAR ) 5 MG tablet Take 5 mg by mouth daily.     furosemide  (LASIX ) 40 MG tablet Take 1 tablet (40 mg total) by mouth daily as needed for fluid. Must have office visit with Dr Jeffrie for further refills or obtain from PCP 90 tablet 0   hydrALAZINE (APRESOLINE) 25 MG tablet Take 25 mg by mouth 2 (two) times daily.     isosorbide mononitrate (IMDUR) 30 MG 24 hr tablet Take 30 mg by mouth daily.     warfarin (COUMADIN ) 5 MG tablet TAKE 1/2 TO 1 TABLET BY MOUTH ONCE DAILY AS DIRECTED by anticoagulation clinic 30 tablet 5   No current facility-administered medications for this encounter.    ROS- All  systems are reviewed and negative except as per the HPI above  Physical Exam: Vitals:   11/10/23 1330  BP: 114/70  Pulse: 79  Weight: 72.9 kg  Height: 5' 11 (1.803 m)     Wt Readings from Last 3 Encounters:  11/10/23 72.9 kg  08/02/23 80.7 kg  06/18/23 75 kg    GEN- The patient is well appearing, alert and oriented x 3 today.   Neck - no JVD or carotid bruit noted Lungs- Clear to ausculation bilaterally, normal work of breathing Heart- Regular rate (V paced rhythm), no murmurs, rubs or gallops, PMI not laterally  displaced Extremities- no clubbing, cyanosis, or edema Skin - no rash or ecchymosis noted   EKG today demonstrates Vent. rate 79 BPM PR interval * ms QRS duration 132 ms QT/QTcB 452/518 ms P-R-T axes * -72 100 Ventricular-paced rhythm Abnormal ECG When compared with ECG of 02-Aug-2023 14:34, Premature ventricular complexes are no longer Present Vent. rate has increased BY 4 BPM  Echo 11/06/21  1. Akinesis of the inferior and inferolateral walls and hypokinesis of  remaining walls; overall severe LV dysfunction.   2. Left ventricular ejection fraction, by estimation, is 25 to 30%. The  left ventricle has severely decreased function. The left ventricle  demonstrates regional wall motion abnormalities (see scoring  diagram/findings for description). The left  ventricular internal cavity size was severely dilated. Left ventricular  diastolic parameters are consistent with Grade II diastolic dysfunction  (pseudonormalization). Elevated left atrial pressure.   3. Right ventricular systolic function is mildly reduced. The right  ventricular size is mildly enlarged.   4. Left atrial size was severely dilated.   5. Right atrial size was moderately dilated.   6. A small pericardial effusion is present.   7. The mitral valve is normal in structure. Severe mitral valve  regurgitation. No evidence of mitral stenosis.   8. Tricuspid valve regurgitation is moderate.   9. The aortic valve is tricuspid. Aortic valve regurgitation is moderate  to severe. No aortic stenosis is present.  10. Aortic dilatation noted. There is borderline dilatation of the aortic  root, measuring 39 mm.  11. The inferior vena cava is normal in size with greater than 50%  respiratory variability, suggesting right atrial pressure of 3 mmHg.   CHA2DS2-VASc Score = 5  The patient's score is based upon: CHF History: 1 HTN History: 1 Diabetes History: 1 Stroke History: 0 Vascular Disease History: 0 Age Score:  2 Gender Score: 0       ASSESSMENT AND PLAN: Persistent Atrial Fibrillation (ICD10:  I48.19) The patient's CHA2DS2-VASc score is 5, indicating a 7.2% annual risk of stroke.   Persistent atrial flutter starting 03/22/23 S/p DCCV on 06/24/23.   Patient is currently in V paced rhythm. I spoke to patient about amiodarone  reload and repeat cardioversion with the necessity of performing weekly INR checks. However, patient has noted to me today he has been having hematuria and urinating blood clots. Due to this, I will temporarily postpone management of arrhythmia and recommend he seek urgent follow up with PCP for further investigation of new onset hematuria / urinating blood clots. I will have patient follow up in 1 month for reassessment.  Secondary Hypercoagulable State (ICD10:  D68.69) The patient is at significant risk for stroke/thromboembolism based upon his CHA2DS2-VASc Score of 5.  Continue Warfarin (Coumadin ).    High risk medication monitoring (ICD10: J342684) Patient requires ongoing monitoring for anti-arrhythmic medication which has the potential to cause life threatening  arrhythmias or AV block. Qtc stable. Continue amiodarone  200 mg daily.   HTN Stable today.  Chronic HFrEF EF 25-30%, s/p ICD GDMT per primary cardiology team Fluid status appears stable   Follow up in 1 month with Daril Kicks, PA-C.    Dorn Heinrich, PA-C Afib Clinic [facility] [address] [place], [place] [address] [phone]

## 2023-11-10 NOTE — Patient Instructions (Signed)
 [phone] -- Call Dr Charlott as soon as possible

## 2023-11-11 ENCOUNTER — Ambulatory Visit

## 2023-11-11 DIAGNOSIS — N3001 Acute cystitis with hematuria: Secondary | ICD-10-CM | POA: Diagnosis not present

## 2023-11-11 DIAGNOSIS — R3914 Feeling of incomplete bladder emptying: Secondary | ICD-10-CM | POA: Diagnosis not present

## 2023-11-18 ENCOUNTER — Ambulatory Visit: Attending: Internal Medicine

## 2023-11-18 DIAGNOSIS — I483 Typical atrial flutter: Secondary | ICD-10-CM

## 2023-11-18 DIAGNOSIS — Z7901 Long term (current) use of anticoagulants: Secondary | ICD-10-CM

## 2023-11-18 LAB — POCT INR: INR: 2.5 (ref 2.0–3.0)

## 2023-11-18 NOTE — Progress Notes (Signed)
 INR 2.5. Please see anticoagulation encounter

## 2023-11-18 NOTE — Patient Instructions (Signed)
 Description   Continue taking warfarin 1/2 tablet daily except for 1 tablet on Tuesdays, Thursdays, and Saturdays.  Recheck INR in 4 weeks. Stay consistent with greens each week. Coumadin  Clinic [phone].

## 2023-11-24 DIAGNOSIS — N3001 Acute cystitis with hematuria: Secondary | ICD-10-CM | POA: Diagnosis not present

## 2023-11-24 DIAGNOSIS — R3914 Feeling of incomplete bladder emptying: Secondary | ICD-10-CM | POA: Diagnosis not present

## 2023-12-09 DIAGNOSIS — R350 Frequency of micturition: Secondary | ICD-10-CM | POA: Diagnosis not present

## 2023-12-09 DIAGNOSIS — R310 Gross hematuria: Secondary | ICD-10-CM | POA: Diagnosis not present

## 2023-12-09 DIAGNOSIS — R3912 Poor urinary stream: Secondary | ICD-10-CM | POA: Diagnosis not present

## 2023-12-09 DIAGNOSIS — R3914 Feeling of incomplete bladder emptying: Secondary | ICD-10-CM | POA: Diagnosis not present

## 2023-12-09 DIAGNOSIS — R3915 Urgency of urination: Secondary | ICD-10-CM | POA: Diagnosis not present

## 2023-12-15 ENCOUNTER — Ambulatory Visit (HOSPITAL_COMMUNITY)
Admission: RE | Admit: 2023-12-15 | Discharge: 2023-12-15 | Disposition: A | Discharge status: Home / Self Care | Source: Ambulatory Visit | Attending: Physician Assistant | Admitting: Physician Assistant

## 2023-12-15 VITALS — BP 118/56 | HR 70 | Ht 71.0 in | Wt 166.2 lb

## 2023-12-15 DIAGNOSIS — I484 Atypical atrial flutter: Secondary | ICD-10-CM

## 2023-12-15 DIAGNOSIS — I4891 Unspecified atrial fibrillation: Secondary | ICD-10-CM

## 2023-12-15 DIAGNOSIS — D6869 Other thrombophilia: Secondary | ICD-10-CM | POA: Diagnosis not present

## 2023-12-15 DIAGNOSIS — Z79899 Other long term (current) drug therapy: Secondary | ICD-10-CM

## 2023-12-15 DIAGNOSIS — Z5181 Encounter for therapeutic drug level monitoring: Secondary | ICD-10-CM

## 2023-12-15 NOTE — Progress Notes (Signed)
 Primary Care Physician: Charlott Dorn LABOR, MD Referring Physician: Dr. Inocencio EP: Dr. Inocencio Lytle Carlos Gibson is a 83 y.o. male with a history of Chronic systolic CHF, conduction system disease (trifascicular block), CKD, Gilbert's syndrome, ICD, Afib/flutter, DM who presents for follow up in the [facility] Atrial Fibrillation clinic. Patient has been maintained on amiodarone  for rhythm control. He underwent ICD replacement on 04/29/23 with Dr Inocencio. His device has shown persistent afib since 03/22/23. He does report symptoms of fatigue with exertion and intermittent dizziness. S/p DCCV on 06/24/23.   He was seen in clinic 11/10/23 with persistent atrial flutter starting 10/15/23. DCCV deferred at that time due to hematuria.   Patient returns for follow up for atrial fibrillation and amiodarone  monitoring. He reports that he feels well today with more energy than at his last visit. He has converted to an AV paced rhythm. His hematuria has resolved and he is having a CT scan per urology to investigate.   Today, he  denies symptoms of palpitations, chest pain, shortness of breath, orthopnea, PND, lower extremity edema, dizziness, presyncope, syncope, snoring, daytime somnolence, or neurologic sequela. The patient is tolerating medications without difficulties and is otherwise without complaint today.    Past Medical History:  Diagnosis Date   Aortic regurgitation    a. mild AI by echo 01/2016.   Aortic root dilation (HCC)    Aortic valve regurgitation    Bifascicular block    Chronic systolic CHF (congestive heart failure) (HCC)    CKD (chronic kidney disease), stage III (HCC) 01/01/2016   Diabetes (HCC)    Ejection fraction < 50%    Frequent PVCs    Gilbert's syndrome    Hyperlipidemia    Mild diastolic dysfunction    Mitral regurgitation    a. mod by echo 10/207.   NICM (nonischemic cardiomyopathy) (HCC)    Pericardial effusion    a. small by echo 01/2016.   Pulmonary  hypertension (HCC)    Right BBB/left post fasc block    Thrombocytopenia (HCC)     Current Outpatient Medications  Medication Sig Dispense Refill   alfuzosin  (UROXATRAL ) 10 MG 24 hr tablet Take 10 mg by mouth daily.     amiodarone  (PACERONE ) 200 MG tablet Take 1 tablet (200 mg total) by mouth daily. 30 tablet 11   atorvastatin  (LIPITOR) 10 MG tablet Take 10 mg by mouth at bedtime.     bisoprolol  (ZEBETA ) 5 MG tablet TAKE 1 TABLET BY MOUTH ONCE DAILY *PATIENT NEEDS APPOINTMENT FOR FURTHER REFILLS [phone]* 30 tablet 5   cholecalciferol  (VITAMIN D3) 25 MCG (1000 UNIT) tablet Take 1,000 Units by mouth daily. (Patient taking differently: Take 1,000 Units by mouth every other day.)     cyanocobalamin (VITAMIN B12) 1000 MCG tablet Take 1,000 mcg by mouth daily. (Patient taking differently: Take 1,000 mcg by mouth every other day.)     finasteride  (PROSCAR ) 5 MG tablet Take 5 mg by mouth daily.     furosemide  (LASIX ) 40 MG tablet Take 1 tablet (40 mg total) by mouth daily as needed for fluid. Must have office visit with Dr Jeffrie for further refills or obtain from PCP (Patient taking differently: Take 40 mg by mouth as needed for fluid. Must have office visit with Dr Jeffrie for further refills or obtain from PCP) 90 tablet 0   hydrALAZINE (APRESOLINE) 25 MG tablet Take 25 mg by mouth 2 (two) times daily.     isosorbide mononitrate (IMDUR) 30 MG 24  hr tablet Take 30 mg by mouth daily.     warfarin (COUMADIN ) 5 MG tablet TAKE 1/2 TO 1 TABLET BY MOUTH ONCE DAILY AS DIRECTED by anticoagulation clinic 30 tablet 5   No current facility-administered medications for this encounter.    ROS- All systems are reviewed and negative except as per the HPI above  Physical Exam: Vitals:   12/15/23 1336  BP: (!) 118/56  Pulse: 70  Weight: 75.4 kg  Height: 5' 11 (1.803 m)     Wt Readings from Last 3 Encounters:  12/15/23 75.4 kg  11/10/23 72.9 kg  08/02/23 80.7 kg    GEN: Well nourished, well  developed in no acute distress CARDIAC: Regular rate and rhythm, no murmurs, rubs, gallops RESPIRATORY:  Clear to auscultation without rales, wheezing or rhonchi  ABDOMEN: Soft, non-tender, non-distended EXTREMITIES:  No edema; No deformity    EKG today demonstrates AV dual paced rhythm Vent. rate 70 BPM PR interval 208 ms QRS duration 160 ms QT/QTcB 510/550 ms   Echo 11/06/21  1. Akinesis of the inferior and inferolateral walls and hypokinesis of  remaining walls; overall severe LV dysfunction.   2. Left ventricular ejection fraction, by estimation, is 25 to 30%. The  left ventricle has severely decreased function. The left ventricle  demonstrates regional wall motion abnormalities (see scoring  diagram/findings for description). The left  ventricular internal cavity size was severely dilated. Left ventricular  diastolic parameters are consistent with Grade II diastolic dysfunction  (pseudonormalization). Elevated left atrial pressure.   3. Right ventricular systolic function is mildly reduced. The right  ventricular size is mildly enlarged.   4. Left atrial size was severely dilated.   5. Right atrial size was moderately dilated.   6. A small pericardial effusion is present.   7. The mitral valve is normal in structure. Severe mitral valve  regurgitation. No evidence of mitral stenosis.   8. Tricuspid valve regurgitation is moderate.   9. The aortic valve is tricuspid. Aortic valve regurgitation is moderate  to severe. No aortic stenosis is present.  10. Aortic dilatation noted. There is borderline dilatation of the aortic  root, measuring 39 mm.  11. The inferior vena cava is normal in size with greater than 50%  respiratory variability, suggesting right atrial pressure of 3 mmHg.    CHA2DS2-VASc Score = 5  The patient's score is based upon: CHF History: 1 HTN History: 1 Diabetes History: 1 Stroke History: 0 Vascular Disease History: 0 Age Score: 2 Gender Score: 0        ASSESSMENT AND PLAN: Persistent Atrial Fibrillation (ICD10:  I48.19) The patient's CHA2DS2-VASc score is 5, indicating a 7.2% annual risk of stroke.   Patient has converted to AV dual paced rhythm, will not pursue DCCV.  Continue amiodarone  200 mg daily Continue bisoprolol  5 mg daily Continue warfarin  Secondary Hypercoagulable State (ICD10:  D68.69) The patient is at significant risk for stroke/thromboembolism based upon his CHA2DS2-VASc Score of 5.  Continue Warfarin (Coumadin ). Patient having CT to investigate hematuria per urology.   High Risk Medication Monitoring (ICD 10: [id number]) Patient requires ongoing monitoring for anti-arrhythmic medication which has the potential to cause life threatening arrhythmias. Intervals on ECG acceptable for amiodarone  monitoring. Check cmet/TSH today.   HTN Stable on current regimen  Chronic HFrEF EF 25-30%, s/p ICD followed by Dr Inocencio  Fluid status appears stable today   Follow up in the AF clinic in 6 months.    Daril Kicks PA-C Afib Clinic  [facility] [address] [place], [place] [address] [phone]

## 2023-12-16 ENCOUNTER — Ambulatory Visit: Attending: Internal Medicine | Admitting: *Deleted

## 2023-12-16 ENCOUNTER — Ambulatory Visit (HOSPITAL_COMMUNITY): Payer: Self-pay | Admitting: Physician Assistant

## 2023-12-16 DIAGNOSIS — Z5181 Encounter for therapeutic drug level monitoring: Secondary | ICD-10-CM

## 2023-12-16 DIAGNOSIS — I483 Typical atrial flutter: Secondary | ICD-10-CM | POA: Diagnosis not present

## 2023-12-16 DIAGNOSIS — Z7901 Long term (current) use of anticoagulants: Secondary | ICD-10-CM

## 2023-12-16 LAB — COMPREHENSIVE METABOLIC PANEL WITH GFR
ALT: 8 IU/L (ref 0–44)
AST: 12 IU/L (ref 0–40)
Albumin: 3.7 g/dL (ref 3.7–4.7)
Alkaline Phosphatase: 81 IU/L (ref 44–121)
BUN/Creatinine Ratio: 11 (ref 10–24)
BUN: 19 mg/dL (ref 8–27)
Bilirubin Total: 1.3 mg/dL — ABNORMAL HIGH (ref 0.0–1.2)
CO2: 25 mmol/L (ref 20–29)
Calcium: 8.8 mg/dL (ref 8.6–10.2)
Chloride: 104 mmol/L (ref 96–106)
Creatinine, Ser: 1.74 mg/dL — ABNORMAL HIGH (ref 0.76–1.27)
Globulin, Total: 2.5 g/dL (ref 1.5–4.5)
Glucose: 91 mg/dL (ref 70–99)
Potassium: 3.8 mmol/L (ref 3.5–5.2)
Sodium: 142 mmol/L (ref 134–144)
Total Protein: 6.2 g/dL (ref 6.0–8.5)
eGFR: 38 mL/min/1.73 — ABNORMAL LOW (ref >59)

## 2023-12-16 LAB — POCT INR: POC INR: 3.7

## 2023-12-16 LAB — TSH: TSH: 7.45 u[IU]/mL — ABNORMAL HIGH (ref 0.450–4.500)

## 2023-12-16 NOTE — Patient Instructions (Signed)
 Description   Hold warfarin today then continue taking warfarin 1/2 tablet daily except for 1 tablet on Tuesdays, Thursdays, and Saturdays.  Recheck INR in 3 weeks. Stay consistent with greens each week. Coumadin  Clinic [phone].

## 2023-12-16 NOTE — Progress Notes (Signed)
 INR 3.7; Please see anticoagulation encounter  Lab Results  Component Value Date   INR 3.7 12/16/2023   INR 2.5 11/18/2023   INR 3.6 10/21/2023    Description   Hold warfarin today then continue taking warfarin 1/2 tablet daily except for 1 tablet on Tuesdays, Thursdays, and Saturdays.  Recheck INR in 3 weeks. Stay consistent with greens each week. Coumadin  Clinic [phone].

## 2023-12-20 DIAGNOSIS — R310 Gross hematuria: Secondary | ICD-10-CM | POA: Diagnosis not present

## 2023-12-23 DIAGNOSIS — R310 Gross hematuria: Secondary | ICD-10-CM | POA: Diagnosis not present

## 2023-12-29 NOTE — Progress Notes (Signed)
Remote ICD Transmission.

## 2024-01-06 ENCOUNTER — Ambulatory Visit: Attending: Internal Medicine | Admitting: *Deleted

## 2024-01-06 DIAGNOSIS — I483 Typical atrial flutter: Secondary | ICD-10-CM | POA: Diagnosis not present

## 2024-01-06 DIAGNOSIS — Z7901 Long term (current) use of anticoagulants: Secondary | ICD-10-CM

## 2024-01-06 LAB — POCT INR: POC INR: 2.9

## 2024-01-06 NOTE — Patient Instructions (Signed)
 Description   Continue taking warfarin 1/2 tablet daily except for 1 tablet on Tuesdays, Thursdays, and Saturdays.  Recheck INR in 4 weeks. Stay consistent with greens each week. Coumadin  Clinic [phone].

## 2024-01-06 NOTE — Progress Notes (Signed)
 Lab Results  Component Value Date   INR 2.9 01/06/2024   INR 3.7 12/16/2023   INR 2.5 11/18/2023    Description   Continue taking warfarin 1/2 tablet daily except for 1 tablet on Tuesdays, Thursdays, and Saturdays.  Recheck INR in 4 weeks. Stay consistent with greens each week. Coumadin  Clinic [phone].

## 2024-01-18 DIAGNOSIS — Z23 Encounter for immunization: Secondary | ICD-10-CM | POA: Diagnosis not present

## 2024-02-01 DIAGNOSIS — R310 Gross hematuria: Secondary | ICD-10-CM | POA: Diagnosis not present

## 2024-02-01 DIAGNOSIS — R3914 Feeling of incomplete bladder emptying: Secondary | ICD-10-CM | POA: Diagnosis not present

## 2024-02-03 ENCOUNTER — Ambulatory Visit: Attending: Internal Medicine

## 2024-02-03 DIAGNOSIS — Z79899 Other long term (current) drug therapy: Secondary | ICD-10-CM

## 2024-02-03 DIAGNOSIS — Z5181 Encounter for therapeutic drug level monitoring: Secondary | ICD-10-CM | POA: Diagnosis not present

## 2024-02-03 DIAGNOSIS — Z7901 Long term (current) use of anticoagulants: Secondary | ICD-10-CM | POA: Diagnosis not present

## 2024-02-03 DIAGNOSIS — I483 Typical atrial flutter: Secondary | ICD-10-CM | POA: Diagnosis not present

## 2024-02-03 LAB — POCT INR: INR: 2.7 (ref 2.0–3.0)

## 2024-02-03 NOTE — Progress Notes (Signed)
 INR 2.7 Please see anticoagulation encounter Continue taking warfarin 1/2 tablet daily except for 1 tablet on Tuesdays, Thursdays, and Saturdays.  Recheck INR in 6 weeks. Stay consistent with greens each week. Coumadin  Clinic [phone].

## 2024-02-03 NOTE — Patient Instructions (Signed)
 Continue taking warfarin 1/2 tablet daily except for 1 tablet on Tuesdays, Thursdays, and Saturdays.  Recheck INR in 6 weeks. Stay consistent with greens each week. Coumadin  Clinic [phone].

## 2024-02-04 ENCOUNTER — Ambulatory Visit: Payer: Medicare Other

## 2024-02-04 DIAGNOSIS — I483 Typical atrial flutter: Secondary | ICD-10-CM | POA: Diagnosis not present

## 2024-02-05 LAB — CUP PACEART REMOTE DEVICE CHECK
Battery Remaining Longevity: 84 mo
Battery Voltage: 2.98 V
Brady Statistic RV Percent Paced: 97.22 %
Date Time Interrogation Session: 20251031010014
HighPow Impedance: 55 Ohm
Implantable Lead Connection Status: 753985
Implantable Lead Connection Status: 753985
Implantable Lead Connection Status: 753985
Implantable Lead Implant Date: 20191023
Implantable Lead Implant Date: 20191023
Implantable Lead Implant Date: 20191023
Implantable Lead Location: 753858
Implantable Lead Location: 753859
Implantable Lead Location: 753860
Implantable Lead Model: 4598
Implantable Lead Model: 5076
Implantable Lead Model: 6935
Implantable Pulse Generator Implant Date: 20250123
Lead Channel Impedance Value: 247 Ohm
Lead Channel Impedance Value: 323 Ohm
Lead Channel Impedance Value: 323 Ohm
Lead Channel Impedance Value: 380 Ohm
Lead Channel Impedance Value: 418 Ohm
Lead Channel Impedance Value: 437 Ohm
Lead Channel Impedance Value: 456 Ohm
Lead Channel Impedance Value: 608 Ohm
Lead Channel Impedance Value: 703 Ohm
Lead Channel Impedance Value: 722 Ohm
Lead Channel Impedance Value: 741 Ohm
Lead Channel Impedance Value: 798 Ohm
Lead Channel Impedance Value: 817 Ohm
Lead Channel Pacing Threshold Amplitude: 0.625 V
Lead Channel Pacing Threshold Amplitude: 1 V
Lead Channel Pacing Threshold Amplitude: 1.5 V
Lead Channel Pacing Threshold Pulse Width: 0.4 ms
Lead Channel Pacing Threshold Pulse Width: 0.4 ms
Lead Channel Pacing Threshold Pulse Width: 0.8 ms
Lead Channel Sensing Intrinsic Amplitude: 1.6 mV
Lead Channel Sensing Intrinsic Amplitude: 7.8 mV
Lead Channel Setting Pacing Amplitude: 1.5 V
Lead Channel Setting Pacing Amplitude: 2 V
Lead Channel Setting Pacing Amplitude: 2.5 V
Lead Channel Setting Pacing Pulse Width: 0.4 ms
Lead Channel Setting Pacing Pulse Width: 0.8 ms
Lead Channel Setting Sensing Sensitivity: 0.3 mV
Zone Setting Status: 755011
Zone Setting Status: 755011

## 2024-02-06 ENCOUNTER — Ambulatory Visit: Payer: Self-pay | Admitting: Cardiology

## 2024-02-09 NOTE — Progress Notes (Signed)
 Remote ICD Transmission

## 2024-03-01 ENCOUNTER — Telehealth: Payer: Self-pay | Admitting: Cardiology

## 2024-03-01 ENCOUNTER — Other Ambulatory Visit: Payer: Self-pay | Admitting: Cardiology

## 2024-03-01 NOTE — Telephone Encounter (Addendum)
 Remote transmission received. Normal device function. No episodes noted. Presenting rhythm AP/BVP 70 bpm. VP 96.7%, CRT pacing is 79.0%. Optivol was previously elevated however as returned to normal. PA/PVC is noted on presenting rhythm. PVC counter does reflect slight increase, see below highlighted page. Patient has been taking Lasix    Routing to Dr. Inocencio to review for recommendations.

## 2024-03-01 NOTE — Telephone Encounter (Signed)
 Spoke with pt he is sending in transmission and would like a call back

## 2024-03-01 NOTE — Telephone Encounter (Signed)
 Patient c/o Palpitations:  STAT if patient reporting lightheadedness, shortness of breath, or chest pain  How long have you had palpitations/irregular HR/ Afib? Are you having the symptoms now? Pt states fast HR for about a week   Are you currently experiencing lightheadedness, SOB or CP? No lightheadedness or CP but states SOB only with exertion   Do you have a history of afib (atrial fibrillation) or irregular heart rhythm? Yes   Have you checked your BP or HR? (document readings if available): no   Are you experiencing any other symptoms? Headache

## 2024-03-01 NOTE — Telephone Encounter (Signed)
 Patient states for the past 1.5 weeks he has felt his heart beating fast. He has lightheadedness and dizziness when this occurs. He is experiencing shortness of breath when he climbs upstairs or is up moving around.  Feels OK when sitting, no symptoms at rest. Denies any chest pain.  Advised patient to call Afib Clinic at [phone] to discuss symptoms and see what recommendations they have.  Scheduled patient for overdue 6 month F/U with EP APP on 03/31/24 at 3:10 PM.  Will forward to Device Clinic and Dr. Inocencio to review.

## 2024-03-01 NOTE — Telephone Encounter (Signed)
 Called patient inquire about remote transmission, states he sent earlier but will send another one.

## 2024-03-16 ENCOUNTER — Ambulatory Visit: Attending: Internal Medicine

## 2024-03-16 DIAGNOSIS — Z7901 Long term (current) use of anticoagulants: Secondary | ICD-10-CM

## 2024-03-16 DIAGNOSIS — Z5181 Encounter for therapeutic drug level monitoring: Secondary | ICD-10-CM

## 2024-03-16 DIAGNOSIS — Z79899 Other long term (current) drug therapy: Secondary | ICD-10-CM | POA: Diagnosis not present

## 2024-03-16 DIAGNOSIS — I483 Typical atrial flutter: Secondary | ICD-10-CM | POA: Diagnosis not present

## 2024-03-16 LAB — POCT INR: INR: 2.8 (ref 2.0–3.0)

## 2024-03-16 NOTE — Patient Instructions (Signed)
 Continue taking warfarin 1/2 tablet daily except for 1 tablet on Tuesdays, Thursdays, and Saturdays.  Recheck INR in 6 weeks. Stay consistent with greens each week. Coumadin  Clinic [phone].

## 2024-03-16 NOTE — Progress Notes (Signed)
 INR 2.8 Please see anticoagulation encounter Continue taking warfarin 1/2 tablet daily except for 1 tablet on Tuesdays, Thursdays, and Saturdays.  Recheck INR in 6 weeks. Stay consistent with greens each week. Coumadin  Clinic [phone].

## 2024-03-17 ENCOUNTER — Inpatient Hospital Stay (HOSPITAL_COMMUNITY)
Admission: EM | Admit: 2024-03-17 | Discharge: 2024-03-21 | DRG: 291 | Disposition: A | Attending: Family Medicine | Admitting: Family Medicine

## 2024-03-17 ENCOUNTER — Emergency Department (HOSPITAL_COMMUNITY)

## 2024-03-17 ENCOUNTER — Inpatient Hospital Stay (HOSPITAL_COMMUNITY)

## 2024-03-17 ENCOUNTER — Encounter (HOSPITAL_COMMUNITY): Payer: Self-pay

## 2024-03-17 ENCOUNTER — Other Ambulatory Visit: Payer: Self-pay

## 2024-03-17 DIAGNOSIS — I4819 Other persistent atrial fibrillation: Secondary | ICD-10-CM | POA: Diagnosis present

## 2024-03-17 DIAGNOSIS — Z7901 Long term (current) use of anticoagulants: Secondary | ICD-10-CM

## 2024-03-17 DIAGNOSIS — I5023 Acute on chronic systolic (congestive) heart failure: Secondary | ICD-10-CM | POA: Diagnosis present

## 2024-03-17 DIAGNOSIS — Z882 Allergy status to sulfonamides status: Secondary | ICD-10-CM

## 2024-03-17 DIAGNOSIS — E1165 Type 2 diabetes mellitus with hyperglycemia: Secondary | ICD-10-CM | POA: Diagnosis present

## 2024-03-17 DIAGNOSIS — I10 Essential (primary) hypertension: Secondary | ICD-10-CM | POA: Diagnosis present

## 2024-03-17 DIAGNOSIS — E7849 Other hyperlipidemia: Secondary | ICD-10-CM | POA: Diagnosis present

## 2024-03-17 DIAGNOSIS — Z823 Family history of stroke: Secondary | ICD-10-CM

## 2024-03-17 DIAGNOSIS — I130 Hypertensive heart and chronic kidney disease with heart failure and stage 1 through stage 4 chronic kidney disease, or unspecified chronic kidney disease: Secondary | ICD-10-CM | POA: Diagnosis present

## 2024-03-17 DIAGNOSIS — I493 Ventricular premature depolarization: Secondary | ICD-10-CM | POA: Diagnosis present

## 2024-03-17 DIAGNOSIS — I509 Heart failure, unspecified: Secondary | ICD-10-CM | POA: Diagnosis present

## 2024-03-17 DIAGNOSIS — E1169 Type 2 diabetes mellitus with other specified complication: Secondary | ICD-10-CM | POA: Diagnosis present

## 2024-03-17 DIAGNOSIS — E1122 Type 2 diabetes mellitus with diabetic chronic kidney disease: Secondary | ICD-10-CM | POA: Diagnosis present

## 2024-03-17 DIAGNOSIS — I4892 Unspecified atrial flutter: Secondary | ICD-10-CM | POA: Diagnosis present

## 2024-03-17 DIAGNOSIS — I453 Trifascicular block: Secondary | ICD-10-CM | POA: Diagnosis present

## 2024-03-17 DIAGNOSIS — R338 Other retention of urine: Secondary | ICD-10-CM | POA: Diagnosis present

## 2024-03-17 DIAGNOSIS — N1330 Unspecified hydronephrosis: Secondary | ICD-10-CM | POA: Diagnosis present

## 2024-03-17 DIAGNOSIS — D696 Thrombocytopenia, unspecified: Secondary | ICD-10-CM | POA: Diagnosis present

## 2024-03-17 DIAGNOSIS — Z91119 Patient's noncompliance with dietary regimen due to unspecified reason: Secondary | ICD-10-CM | POA: Diagnosis not present

## 2024-03-17 DIAGNOSIS — N5089 Other specified disorders of the male genital organs: Secondary | ICD-10-CM | POA: Diagnosis not present

## 2024-03-17 DIAGNOSIS — Z79899 Other long term (current) drug therapy: Secondary | ICD-10-CM

## 2024-03-17 DIAGNOSIS — Z888 Allergy status to other drugs, medicaments and biological substances status: Secondary | ICD-10-CM

## 2024-03-17 DIAGNOSIS — N401 Enlarged prostate with lower urinary tract symptoms: Secondary | ICD-10-CM | POA: Diagnosis present

## 2024-03-17 DIAGNOSIS — Z8249 Family history of ischemic heart disease and other diseases of the circulatory system: Secondary | ICD-10-CM | POA: Diagnosis not present

## 2024-03-17 DIAGNOSIS — I081 Rheumatic disorders of both mitral and tricuspid valves: Secondary | ICD-10-CM | POA: Diagnosis present

## 2024-03-17 DIAGNOSIS — I428 Other cardiomyopathies: Secondary | ICD-10-CM | POA: Diagnosis present

## 2024-03-17 DIAGNOSIS — I483 Typical atrial flutter: Secondary | ICD-10-CM | POA: Diagnosis not present

## 2024-03-17 DIAGNOSIS — N1832 Chronic kidney disease, stage 3b: Secondary | ICD-10-CM | POA: Diagnosis present

## 2024-03-17 DIAGNOSIS — I5021 Acute systolic (congestive) heart failure: Secondary | ICD-10-CM | POA: Diagnosis not present

## 2024-03-17 DIAGNOSIS — E785 Hyperlipidemia, unspecified: Secondary | ICD-10-CM | POA: Diagnosis not present

## 2024-03-17 DIAGNOSIS — Z95810 Presence of automatic (implantable) cardiac defibrillator: Secondary | ICD-10-CM | POA: Diagnosis not present

## 2024-03-17 DIAGNOSIS — I351 Nonrheumatic aortic (valve) insufficiency: Secondary | ICD-10-CM | POA: Diagnosis present

## 2024-03-17 DIAGNOSIS — N179 Acute kidney failure, unspecified: Secondary | ICD-10-CM | POA: Diagnosis present

## 2024-03-17 DIAGNOSIS — I272 Pulmonary hypertension, unspecified: Secondary | ICD-10-CM | POA: Diagnosis present

## 2024-03-17 DIAGNOSIS — I5043 Acute on chronic combined systolic (congestive) and diastolic (congestive) heart failure: Secondary | ICD-10-CM | POA: Diagnosis not present

## 2024-03-17 LAB — CBC
HCT: 35.9 % — ABNORMAL LOW (ref 39.0–52.0)
Hemoglobin: 11.7 g/dL — ABNORMAL LOW (ref 13.0–17.0)
MCH: 29.5 pg (ref 26.0–34.0)
MCHC: 32.6 g/dL (ref 30.0–36.0)
MCV: 90.4 fL (ref 80.0–100.0)
Platelets: 106 K/uL — ABNORMAL LOW (ref 150–400)
RBC: 3.97 MIL/uL — ABNORMAL LOW (ref 4.22–5.81)
RDW: 14.2 % (ref 11.5–15.5)
WBC: 3.7 K/uL — ABNORMAL LOW (ref 4.0–10.5)
nRBC: 0 % (ref 0.0–0.2)

## 2024-03-17 LAB — BASIC METABOLIC PANEL WITH GFR
Anion gap: 9 (ref 5–15)
BUN: 30 mg/dL — ABNORMAL HIGH (ref 8–23)
CO2: 24 mmol/L (ref 22–32)
Calcium: 8.4 mg/dL — ABNORMAL LOW (ref 8.9–10.3)
Chloride: 106 mmol/L (ref 98–111)
Creatinine, Ser: 2.53 mg/dL — ABNORMAL HIGH (ref 0.61–1.24)
GFR, Estimated: 25 mL/min — ABNORMAL LOW (ref >60)
Glucose, Bld: 228 mg/dL — ABNORMAL HIGH (ref 70–99)
Potassium: 3.5 mmol/L (ref 3.5–5.1)
Sodium: 139 mmol/L (ref 135–145)

## 2024-03-17 LAB — PROTIME-INR
INR: 3 — ABNORMAL HIGH (ref 0.8–1.2)
Prothrombin Time: 32.5 s — ABNORMAL HIGH (ref 11.4–15.2)

## 2024-03-17 LAB — BRAIN NATRIURETIC PEPTIDE: B Natriuretic Peptide: 3081.4 pg/mL — ABNORMAL HIGH (ref 0.0–100.0)

## 2024-03-17 MED ORDER — WARFARIN SODIUM 2 MG PO TABS
2.0000 mg | ORAL_TABLET | Freq: Once | ORAL | Status: AC
  Administered 2024-03-18: 2 mg via ORAL
  Filled 2024-03-17: qty 1

## 2024-03-17 MED ORDER — ALFUZOSIN HCL ER 10 MG PO TB24
10.0000 mg | ORAL_TABLET | Freq: Every day | ORAL | Status: DC
  Administered 2024-03-18 – 2024-03-21 (×4): 10 mg via ORAL
  Filled 2024-03-17 (×4): qty 1

## 2024-03-17 MED ORDER — WARFARIN SODIUM 2.5 MG PO TABS: 2.5000 mg | ORAL_TABLET | Freq: Every day | ORAL | Status: DC

## 2024-03-17 MED ORDER — WARFARIN - PHARMACIST DOSING INPATIENT: Freq: Every day | Status: DC

## 2024-03-17 MED ORDER — ATORVASTATIN CALCIUM 10 MG PO TABS
10.0000 mg | ORAL_TABLET | Freq: Every day | ORAL | Status: DC
  Administered 2024-03-18 – 2024-03-20 (×4): 10 mg via ORAL
  Filled 2024-03-17 (×4): qty 1

## 2024-03-17 MED ORDER — ACETAMINOPHEN 325 MG PO TABS: 650.0000 mg | ORAL_TABLET | ORAL | Status: DC | PRN

## 2024-03-17 MED ORDER — AMIODARONE HCL 200 MG PO TABS
200.0000 mg | ORAL_TABLET | Freq: Every day | ORAL | Status: DC
  Administered 2024-03-18 – 2024-03-21 (×4): 200 mg via ORAL
  Filled 2024-03-17 (×4): qty 1

## 2024-03-17 MED ORDER — ISOSORBIDE MONONITRATE ER 30 MG PO TB24
30.0000 mg | ORAL_TABLET | Freq: Every day | ORAL | Status: DC
  Administered 2024-03-18 – 2024-03-21 (×4): 30 mg via ORAL
  Filled 2024-03-17 (×4): qty 1

## 2024-03-17 MED ORDER — FUROSEMIDE 10 MG/ML IJ SOLN
40.0000 mg | Freq: Once | INTRAMUSCULAR | Status: AC
  Administered 2024-03-17: 40 mg via INTRAVENOUS
  Filled 2024-03-17: qty 4

## 2024-03-17 MED ORDER — FINASTERIDE 5 MG PO TABS
5.0000 mg | ORAL_TABLET | Freq: Every day | ORAL | Status: DC
  Administered 2024-03-18 – 2024-03-21 (×4): 5 mg via ORAL
  Filled 2024-03-17 (×4): qty 1

## 2024-03-17 MED ORDER — VITAMIN B-12 1000 MCG PO TABS
1000.0000 ug | ORAL_TABLET | Freq: Every day | ORAL | Status: DC
  Administered 2024-03-18 – 2024-03-21 (×4): 1000 ug via ORAL
  Filled 2024-03-17 (×4): qty 1

## 2024-03-17 MED ORDER — SODIUM CHLORIDE 0.9% FLUSH: 3.0000 mL | INTRAVENOUS | Status: DC | PRN

## 2024-03-17 MED ORDER — BISOPROLOL FUMARATE 5 MG PO TABS
5.0000 mg | ORAL_TABLET | Freq: Every day | ORAL | Status: DC
  Administered 2024-03-18 – 2024-03-21 (×4): 5 mg via ORAL
  Filled 2024-03-17 (×4): qty 1

## 2024-03-17 MED ORDER — HYDRALAZINE HCL 25 MG PO TABS
25.0000 mg | ORAL_TABLET | Freq: Two times a day (BID) | ORAL | Status: DC
  Administered 2024-03-18 – 2024-03-21 (×7): 25 mg via ORAL
  Filled 2024-03-17 (×8): qty 1

## 2024-03-17 MED ORDER — FUROSEMIDE 10 MG/ML IJ SOLN
40.0000 mg | Freq: Two times a day (BID) | INTRAMUSCULAR | Status: DC
  Administered 2024-03-18 – 2024-03-20 (×5): 40 mg via INTRAVENOUS
  Filled 2024-03-17 (×5): qty 4

## 2024-03-17 MED ORDER — SODIUM CHLORIDE 0.9 % IV SOLN: 250.0000 mL | INTRAVENOUS | Status: AC | PRN

## 2024-03-17 MED ORDER — SODIUM CHLORIDE 0.9% FLUSH
3.0000 mL | Freq: Two times a day (BID) | INTRAVENOUS | Status: DC
  Administered 2024-03-18 (×2): 3 mL via INTRAVENOUS

## 2024-03-17 MED ORDER — INSULIN ASPART 100 UNIT/ML IJ SOLN
0.0000 [IU] | Freq: Three times a day (TID) | INTRAMUSCULAR | Status: DC
  Administered 2024-03-18 – 2024-03-19 (×2): 1 [IU] via SUBCUTANEOUS
  Administered 2024-03-19: 2 [IU] via SUBCUTANEOUS
  Administered 2024-03-20: 07:00:00 1 [IU] via SUBCUTANEOUS
  Administered 2024-03-20 – 2024-03-21 (×2): 2 [IU] via SUBCUTANEOUS
  Filled 2024-03-17 (×3): qty 1
  Filled 2024-03-17: qty 2
  Filled 2024-03-17 (×3): qty 1
  Filled 2024-03-17: qty 3

## 2024-03-17 MED ORDER — ONDANSETRON HCL 4 MG/2ML IJ SOLN: 4.0000 mg | Freq: Four times a day (QID) | INTRAMUSCULAR | Status: DC | PRN

## 2024-03-17 NOTE — ED Notes (Signed)
 CCMD called.

## 2024-03-17 NOTE — TOC CM/SW Note (Signed)
 TOC consult received for d/c planning needs. Follow-up to be completed with patient as appropriate.   Merilee Batty, MSN, RN Case Management [phone]

## 2024-03-17 NOTE — ED Triage Notes (Signed)
 Pt c.o Carlos Gibson x 2 weeks, denies chest pain or lower extremity edema. Pt also c.o feeling like he is in an irregular heart rhythm, has an ICD Resp e.u at this time

## 2024-03-17 NOTE — ED Provider Triage Note (Signed)
 Emergency Medicine Provider Triage Evaluation Note  Carlos Gibson , a 83 y.o. male  was evaluated in triage.  Pt complains of worsening dyspnea over the past 2 weeks.  Patient has a history of CHF, taking Lasix .  He denies recent change in medication and denies any missed doses of Lasix .  He reports dyspnea has been progressively worsening.  Last night he was unable to sleep all laying flat and had to sleep in a recliner to avoid shortness of breath.  He denies swelling of the abdomen or lower extremities.  He denies chest pain, dizziness, or syncope.  Review of Systems  Positive: Dyspnea with exertion Negative: Chest pain, dizziness, syncope  Physical Exam  BP 115/67 (BP Location: Right Arm)   Pulse 69   Temp 97.8 F (36.6 C) (Oral)   Resp 17   Ht 5' 11 (1.803 m)   Wt 83 kg   SpO2 100%   BMI 25.52 kg/m  Gen:   Awake, no distress.  Able to speak in complete sentences without issue. Resp:  Normal effort with clear lung sounds throughout and no obvious Rales. MSK:   Moves extremities without difficulty  Other:  No lower extremity swelling or obvious abdominal swelling.  Medical Decision Making  Medically screening exam initiated at 5:24 PM.  Appropriate orders placed.  RAMONA SLINGER was informed that the remainder of the evaluation will be completed by another provider, this initial triage assessment does not replace that evaluation, and the importance of remaining in the ED until their evaluation is complete.   Rosina Almarie LABOR, PA-C 03/17/24 1727

## 2024-03-17 NOTE — H&P (Signed)
 History and Physical    Patient: Carlos Gibson FMW:[id number] DOB: May 05, 1940 DOA: 03/17/2024 DOS: the patient was seen and examined on 03/17/2024 PCP: Charlott Dorn LABOR, MD  Patient coming from: Home  Chief Complaint:  Chief Complaint  Patient presents with   Shortness of Breath   HPI: Carlos Gibson is a 83 y.o. male with medical history significant of chronic systolic CHF attributed to be secondary to nonischemic cardiomyopathy, history of trifascicular block status AICD, persistent A-fib/flutter on anticoagulation, chronic kidney disease stage IIIb, diabetes mellitus, pulmonary hypertension, hypertension, dyslipidemia.  He follows up with Dr. Inocencio.  He has been in his usual state of health until over the past couple of weeks, he has noted worsening exertional dyspnea.  This has progressed to shortness of breath at rest.  He admitted to recent orthopnea and paroxysmal nocturnal dyspnea.  He had sit up in his recliner to sleep last night which is unusual from his baseline.  He admits to being compliant with his medications including doubling up on his furosemide  when symptoms worsen.  He admitted to eating some salt over the Thanksgiving holidays.  He denies any associated chest pain or palpitations.  Denies any nausea or vomiting.  Review of Systems: As mentioned in the history of present illness. All other systems reviewed and are negative. Past Medical History:  Diagnosis Date   Aortic regurgitation    a. mild AI by echo 01/2016.   Aortic root dilation    Aortic valve regurgitation    Bifascicular block    Chronic systolic CHF (congestive heart failure) (HCC)    CKD (chronic kidney disease), stage III (HCC) 01/01/2016   Diabetes (HCC)    Ejection fraction < 50%    Frequent PVCs    Gilbert's syndrome    Hyperlipidemia    Mild diastolic dysfunction    Mitral regurgitation    a. mod by echo 10/207.   NICM (nonischemic cardiomyopathy) (HCC)    Pericardial effusion    a.  small by echo 01/2016.   Pulmonary hypertension (HCC)    Right BBB/left post fasc block    Thrombocytopenia    Past Surgical History:  Procedure Laterality Date   BIV ICD GENERATOR CHANGEOUT N/A 04/29/2023   Procedure: BIV ICD GENERATOR CHANGEOUT;  Surgeon: Inocencio Soyla Lunger, MD;  Location: [facility] INVASIVE CV LAB;  Service: Cardiovascular;  Laterality: N/A;   BIV ICD INSERTION CRT-D N/A 01/26/2018   Procedure: BIV ICD INSERTION CRT-D;  Surgeon: Inocencio Soyla Lunger, MD;  Location: [facility] INVASIVE CV LAB;  Service: Cardiovascular;  Laterality: N/A;   CARDIAC CATHETERIZATION N/A 12/20/2014   Procedure: Right/Left Heart Cath and Coronary Angiography;  Surgeon: Oneil JAYSON Parchment, MD;  Location: [facility] INVASIVE CV LAB;  Service: Cardiovascular;  Laterality: N/A;   CARDIOVERSION N/A 10/24/2018   Procedure: CARDIOVERSION;  Surgeon: Alveta Aleene JINNY, MD;  Location: [facility] ENDOSCOPY;  Service: Cardiovascular;  Laterality: N/A;   CARDIOVERSION N/A 05/13/2022   Procedure: CARDIOVERSION;  Surgeon: Lonni Slain, MD;  Location: [facility] ENDOSCOPY;  Service: Cardiovascular;  Laterality: N/A;   CARDIOVERSION N/A 06/24/2023   Procedure: CARDIOVERSION;  Surgeon: Pietro Redell RAMAN, MD;  Location: [facility] INVASIVE CV LAB;  Service: Cardiovascular;  Laterality: N/A;   HERNIA REPAIR  06/2017   Social History:  reports that he has never smoked. He has been exposed to tobacco smoke. He has never used smokeless tobacco. He reports that he does not drink alcohol and does not use drugs.  Allergies[1]  Family History  Problem Relation  Age of Onset   Hypertension Mother    Stroke Mother    Irregular heart beat Father    Unexplained death Father     Prior to Admission medications  Medication Sig Start Date End Date Taking? Authorizing Provider  alfuzosin  (UROXATRAL ) 10 MG 24 hr tablet Take 10 mg by mouth daily. 11/11/23   [provider]  amiodarone  (PACERONE ) 200 MG tablet Take 1 tablet (200 mg total) by mouth daily. 08/02/23    Camnitz, Soyla Lunger, MD  atorvastatin  (LIPITOR) 10 MG tablet Take 10 mg by mouth at bedtime.    [provider]  bisoprolol  (ZEBETA ) 5 MG tablet Take 1 tablet (5 mg total) by mouth daily. 03/07/24   Camnitz, Soyla Lunger, MD  cholecalciferol  (VITAMIN D3) 25 MCG (1000 UNIT) tablet Take 1,000 Units by mouth daily. Patient taking differently: Take 1,000 Units by mouth every other day.    [provider]  cyanocobalamin (VITAMIN B12) 1000 MCG tablet Take 1,000 mcg by mouth daily. Patient taking differently: Take 1,000 mcg by mouth every other day.    [provider]  finasteride  (PROSCAR ) 5 MG tablet Take 5 mg by mouth daily. 04/28/23   [provider]  furosemide  (LASIX ) 40 MG tablet Take 1 tablet (40 mg total) by mouth daily as needed for fluid. Must have office visit with Dr Jeffrie for further refills or obtain from PCP Patient taking differently: Take 40 mg by mouth as needed for fluid. Must have office visit with Dr Jeffrie for further refills or obtain from PCP 06/11/23   Jeffrie Oneil BROCKS, MD  hydrALAZINE (APRESOLINE) 25 MG tablet Take 25 mg by mouth 2 (two) times daily.    [provider]  isosorbide mononitrate (IMDUR) 30 MG 24 hr tablet Take 30 mg by mouth daily.    [provider]  warfarin (COUMADIN ) 5 MG tablet TAKE 1/2 TO 1 TABLET BY MOUTH ONCE DAILY AS DIRECTED by anticoagulation clinic 06/25/23   Jeffrie Oneil BROCKS, MD    Physical Exam: Vitals:   03/17/24 1644 03/17/24 1706 03/17/24 1710 03/17/24 1854  BP: 115/67   (!) 151/116  Pulse: 69   70  Resp: 17   19  Temp:   97.8 F (36.6 C) 97.7 F (36.5 C)  TempSrc:   Oral   SpO2: 100%   98%  Weight:  83 kg    Height:  5' 11 (1.803 m)     General: Patient is a well-looking elderly gentleman who was seen sitting up comfortably in bed.  Not in any acute distress.  Daughters at bedside. HEENT: Head is atraumatic.  Unable to visualize oral mucosa due to mask. Neck: Supple with no significant  JVD. Chest: Clinically diminished bilaterally without any added sounds. Abdomen flat soft nontender with no organomegaly. Extremities without any pedal edema Skin: Negative for any new rash Gait could not be assessed at this time. CNS shows no focal deficits. Data Reviewed: {Sodium is 139, potassium 3.5, chloride 106, bicarb 24, glucose is 228, BUN 30, creatinine 2.53, calcium  8.4, BNP 3081 WBC is 3.7, hemoglobin is 11.7, hematocrit 36, platelet count is 106   Chest x-ray with findings of left-sided multi lead cardiac pacing device. Cardiomegaly with central congestion. Small bilateral effusions. Aortic atherosclerosis   IMPRESSION: Cardiomegaly with central congestion and small bilateral effusions.  Assessment and Plan: 83 year old male with history of nonischemic cardiomyopathy, HFrEF, persistent A-fib/flutter who presents with worsening congestive heart failure and renal failure.  Acute on chronic systolic CHF  exacerbation: Last known ejection fraction of 25 to 30%.  Known history of nonischemic cardiomyopathy.  Status post AICD.  Patient has been noncompliant with diet over the holidays.  Patient will be optimized with IV Lasix .  Daily weight monitoring.  Free water restriction with 1200 cc.  Acute on chronic kidney injury stage 3b: Likely cardiorenal in etiology.  Patient will be optimized with IV Lasix .  Will consult with nephrology in the morning for optimization of patient's symptoms.  Renal ultrasound ordered to rule out any postobstructive causes.  Persistent atrial flutter/fibrillation: Rate controlled on amiodarone .  Will continue current dose.  Patient is also anticoagulated with Coumadin .  INR was noted to be 3.  Thrombocytopenia: Asymptomatic at this time.  Patient is on Coumadin  at baseline.  INR therapeutic at 3.  Diabetes mellitus with hyperglycemia: Patient is not on any chronic medications.  Fingerstick glucose monitoring as per protocol.  Insulin  sliding scale will  be instituted.  Will send for A1c.  Abnormal TSH from labs of 12/2023.  Likely may be related to amiodarone .  Consider repeating TSH during the course of stay.   Advance Care Planning:   Code Status: Full Code   Consults: Cardiology and nephrology consult  Family Communication: Daughter  at  Severity of Illness: The appropriate patient status for this patient is INPATIENT. Inpatient status is judged to be reasonable and necessary in order to provide the required intensity of service to ensure the patient's safety. The patient's presenting symptoms, physical exam findings, and initial radiographic and laboratory data in the context of their chronic comorbidities is felt to place them at high risk for further clinical deterioration. Furthermore, it is not anticipated that the patient will be medically stable for discharge from the hospital within 2 midnights of admission.   * I certify that at the point of admission it is my clinical judgment that the patient will require inpatient hospital care spanning beyond 2 midnights from the point of admission due to high intensity of service, high risk for further deterioration and high frequency of surveillance required.*  Author: Maude MARLA Dart, MD 03/17/2024 10:24 PM  For on call review [email or web address].      [1]  Allergies Allergen Reactions   Metformin  Hcl Diarrhea   Sulfa Antibiotics     Unknown reaction   Ace Inhibitors Cough   Coreg  [Carvedilol ] Other (See Comments)    feels bad   Toprol  Xl [Metoprolol  Tartrate] Other (See Comments)    feels bad

## 2024-03-17 NOTE — Progress Notes (Signed)
 ANTICOAGULATION CONSULT NOTE  Pharmacy Consult for Warfarin Indication: atrial flutter  Allergies[1]  Patient Measurements: Height: 5' 11 (180.3 cm) Weight: 83 kg (183 lb) IBW/kg (Calculated) : 75.3  Vital Signs: Temp: 97.7 F (36.5 C) (12/12 1854) Temp Source: Oral (12/12 1710) BP: 151/116 (12/12 1854) Pulse Rate: 70 (12/12 1854)  Labs: Recent Labs    03/16/24 1518 03/17/24 1708 03/17/24 2138  HGB  --  11.7*  --   HCT  --  35.9*  --   PLT  --  106*  --   LABPROT  --   --  32.5*  INR 2.8  --  3.0*  CREATININE  --  2.53*  --     Estimated Creatinine Clearance: 23.6 mL/min (A) (by C-G formula based on SCr of 2.53 mg/dL (H)).   Medical History: Past Medical History:  Diagnosis Date   Aortic regurgitation    a. mild AI by echo 01/2016.   Aortic root dilation    Aortic valve regurgitation    Bifascicular block    Chronic systolic CHF (congestive heart failure) (HCC)    CKD (chronic kidney disease), stage III (HCC) 01/01/2016   Diabetes (HCC)    Ejection fraction < 50%    Frequent PVCs    Gilbert's syndrome    Hyperlipidemia    Mild diastolic dysfunction    Mitral regurgitation    a. mod by echo 10/207.   NICM (nonischemic cardiomyopathy) (HCC)    Pericardial effusion    a. small by echo 01/2016.   Pulmonary hypertension (HCC)    Right BBB/left post fasc block    Thrombocytopenia     Medications:  (Not in a hospital admission)  Scheduled:   [START ON 03/18/2024] alfuzosin   10 mg Oral Daily   [START ON 03/18/2024] amiodarone   200 mg Oral Daily   atorvastatin   10 mg Oral QHS   [START ON 03/18/2024] bisoprolol   5 mg Oral Daily   [START ON 03/18/2024] cyanocobalamin  1,000 mcg Oral Daily   [START ON 03/18/2024] finasteride   5 mg Oral Daily   furosemide   40 mg Intravenous Q12H   hydrALAZINE  25 mg Oral BID   [START ON 03/18/2024] insulin  aspart  0-9 Units Subcutaneous TID WC   [START ON 03/18/2024] isosorbide mononitrate  30 mg Oral Daily   sodium  chloride flush  3 mL Intravenous Q12H   Infusions:   sodium chloride      PRN: sodium chloride , acetaminophen , ondansetron  (ZOFRAN ) IV, sodium chloride  flush  Assessment: 83 yom with a history of AF on warfarin, HF, ICD, HTN. Patient is presenting with SOB. Warfarin per pharmacy consult placed for atrial flutter.  Patient taking warfarin prior to arrival. Home dose is 5 mg (5 mg x 1) every Tue, Thu, Sat; 2.5 mg (5 mg x 0.5) all other days. Last taken 12/11 evening per patient.  PT / INR today is 32.5 / 3, which is upper margin of therapeutic Hgb 11.7; plt 106  Goal of Therapy:  INR Goal 2-3 Monitor platelets by anticoagulation protocol: Yes   Plan:  Give 2 mg warfarin tonight - repeat dosing per INR Monitor for s/s of hemorrhage, daily INR, CBC Watch for new DDIs  Dorn Buttner, PharmD, BCPS 03/17/2024 10:19 PM ED Clinical Pharmacist -  [phone]        [1]  Allergies Allergen Reactions   Metformin  Hcl Diarrhea   Sulfa Antibiotics     Unknown reaction   Ace Inhibitors Cough   Coreg  [Carvedilol ] Other (See  Comments)    feels bad   Toprol  Xl [Metoprolol  Tartrate] Other (See Comments)    feels bad

## 2024-03-17 NOTE — ED Triage Notes (Signed)
 Pt reports SHOB for 2 weeks, worse in the past few days. HX CHF, has been taking his lasix  as prescribed. Says he feels [facility] even when he is brushing his teeth

## 2024-03-17 NOTE — ED Provider Notes (Signed)
 [place] EMERGENCY DEPARTMENT AT [facility] Provider Note   CSN: [id number] Arrival date & time: 03/17/24  1640     Patient presents with: Shortness of Breath   Carlos Gibson is a 83 y.o. male.   Patient is an 83 year old male with past medical history of A-fib on Coumadin , CHF with ICD in place, hypertension presenting to the emergency department with shortness of breath.  The patient states that he has had worsening shortness of breath over the last few weeks however last night he was unable to sleep or lie flat because he was so short of breath which prompted him to come to the emergency department.  He states he gets short of breath even with brushing his teeth.  The patient denies any associated chest pain, fever or cough or any significant lower extremity swelling.  He states that he has been taking all his medications as prescribed.  The history is provided by the patient and a relative.  Shortness of Breath      Prior to Admission medications  Medication Sig Start Date End Date Taking? Authorizing Provider  alfuzosin  (UROXATRAL ) 10 MG 24 hr tablet Take 10 mg by mouth daily. 11/11/23   [provider]  amiodarone  (PACERONE ) 200 MG tablet Take 1 tablet (200 mg total) by mouth daily. 08/02/23   Camnitz, Soyla Lunger, MD  atorvastatin  (LIPITOR) 10 MG tablet Take 10 mg by mouth at bedtime.    [provider]  bisoprolol  (ZEBETA ) 5 MG tablet Take 1 tablet (5 mg total) by mouth daily. 03/07/24   Camnitz, Soyla Lunger, MD  cholecalciferol  (VITAMIN D3) 25 MCG (1000 UNIT) tablet Take 1,000 Units by mouth daily. Patient taking differently: Take 1,000 Units by mouth every other day.    [provider]  cyanocobalamin (VITAMIN B12) 1000 MCG tablet Take 1,000 mcg by mouth daily. Patient taking differently: Take 1,000 mcg by mouth every other day.    [provider]  finasteride  (PROSCAR ) 5 MG tablet Take 5 mg by mouth daily. 04/28/23   [provider]  furosemide  (LASIX ) 40 MG tablet Take 1 tablet (40 mg total) by mouth daily as needed for fluid. Must have office visit with Dr Jeffrie for further refills or obtain from PCP Patient taking differently: Take 40 mg by mouth as needed for fluid. Must have office visit with Dr Jeffrie for further refills or obtain from PCP 06/11/23   Jeffrie Oneil BROCKS, MD  hydrALAZINE (APRESOLINE) 25 MG tablet Take 25 mg by mouth 2 (two) times daily.    [provider]  isosorbide mononitrate (IMDUR) 30 MG 24 hr tablet Take 30 mg by mouth daily.    [provider]  warfarin (COUMADIN ) 5 MG tablet TAKE 1/2 TO 1 TABLET BY MOUTH ONCE DAILY AS DIRECTED by anticoagulation clinic 06/25/23   Jeffrie Oneil BROCKS, MD    Allergies: Metformin  hcl, Sulfa antibiotics, Ace inhibitors, Coreg  [carvedilol ], and Toprol  xl [metoprolol  tartrate]    Review of Systems  Respiratory:  Positive for shortness of breath.     Updated Vital Signs BP (!) 151/116 (BP Location: Right Arm)   Pulse 70   Temp 97.7 F (36.5 C)   Resp 19   Ht 5' 11 (1.803 m)   Wt 83 kg   SpO2 98%   BMI 25.52 kg/m   Physical Exam Vitals and nursing note reviewed.  Constitutional:      General: He is not in acute distress.    Appearance: He  is well-developed.  HENT:     Mouth/Throat:     Mouth: Mucous membranes are moist.  Eyes:     Extraocular Movements: Extraocular movements intact.  Neck:     Vascular: JVD present.  Cardiovascular:     Rate and Rhythm: Normal rate and regular rhythm.  Pulmonary:     Effort: Pulmonary effort is normal.     Breath sounds: Normal breath sounds.  Abdominal:     Palpations: Abdomen is soft.  Musculoskeletal:        General: Normal range of motion.     Cervical back: Normal range of motion and neck supple.     Right lower leg: Edema (1+ to shins) present.     Left lower leg: Edema (1+ to shins) present.  Skin:    General: Skin is warm and dry.  Neurological:     General: No focal deficit  present.     Mental Status: He is alert and oriented to person, place, and time.  Psychiatric:        Mood and Affect: Mood normal.        Behavior: Behavior normal.     (all labs ordered are listed, but only abnormal results are displayed) Labs Reviewed  BASIC METABOLIC PANEL WITH GFR - Abnormal; Notable for the following components:      Result Value   Glucose, Bld 228 (*)    BUN 30 (*)    Creatinine, Ser 2.53 (*)    Calcium  8.4 (*)    GFR, Estimated 25 (*)    All other components within normal limits  CBC - Abnormal; Notable for the following components:   WBC 3.7 (*)    RBC 3.97 (*)    Hemoglobin 11.7 (*)    HCT 35.9 (*)    Platelets 106 (*)    All other components within normal limits  BRAIN NATRIURETIC PEPTIDE - Abnormal; Notable for the following components:   B Natriuretic Peptide 3,081.4 (*)    All other components within normal limits  PROTIME-INR    EKG: EKG Interpretation Date/Time:  Friday March 17 2024 16:52:09 EST Ventricular Rate:  74 PR Interval:  236 QRS Duration:  158 QT Interval:  502 QTC Calculation: 557 R Axis:   257  Text Interpretation: AV dual-paced rhythm with prolonged AV conduction with occasional ventricular-paced complexes Abnormal ECG When compared with ECG of 15-Dec-2023 13:43, PREVIOUS ECG IS PRESENT Confirmed by Ellouise Fine ([id number]) on 03/17/2024 8:39:00 PM  Radiology: DG Chest 2 View Result Date: 03/17/2024 CLINICAL DATA:  Shortness of breath EXAM: CHEST - 2 VIEW COMPARISON:  01/27/2018 FINDINGS: Left-sided multi lead cardiac pacing device. Cardiomegaly with central congestion. Small bilateral effusions. Aortic atherosclerosis IMPRESSION: Cardiomegaly with central congestion and small bilateral effusions. Electronically Signed   By: Luke Bun M.D.   On: 03/17/2024 18:58     Procedures   Medications Ordered in the ED  furosemide  (LASIX ) injection 40 mg (has no administration in time range)                                     Medical Decision Making This patient presents to the ED with chief complaint(s) of SOB with pertinent past medical history of CHF with ICD in place, A fib on coumadin , HTN, DM which further complicates the presenting complaint. The complaint involves an extensive differential diagnosis and also carries with it a high risk of complications  and morbidity.    The differential diagnosis includes ACS, arrhythmia, anemia, pneumonia, pneumothorax, pulmonary edema, pleural effusion, viral syndrome  Additional history obtained: Additional history obtained from family Records reviewed outpatient cardiology records  ED Course and Reassessment: On patient's arrival he is hemodynamically stable in no acute distress.  He had EKG and labs and chest x-ray performed in triage.  EKG showed AV dual paced rhythm, labs does show significantly elevated BNP and a mild AKI on CKD.  Chest x-ray does show increased vascular congestion and he does have JVD and pitting edema on exam concerning for CHF exacerbation.  Patient will be started on IV Lasix  and was recommended admission for diuresis.  Independent labs interpretation:  The following labs were independently interpreted: Significantly elevated BNP, AKI on CKD  Independent visualization of imaging: - I independently visualized the following imaging with scope of interpretation limited to determining acute life threatening conditions related to emergency care: Chest x-ray, which revealed increased vascular congestion  Consultation: - Consulted or discussed management/test interpretation w/ external professional: Hospitalist  Consideration for admission or further workup: Patient requires admission for diuresis Social Determinants of health: N/A    Amount and/or Complexity of Data Reviewed Labs: ordered. Radiology: ordered.  Risk Prescription drug management.       Final diagnoses:  Acute on chronic congestive heart failure, unspecified  heart failure type [facility])    ED Discharge Orders     None          Kingsley, Huber Mathers K, DO 03/17/24 2120

## 2024-03-18 ENCOUNTER — Inpatient Hospital Stay (HOSPITAL_COMMUNITY)

## 2024-03-18 DIAGNOSIS — I5043 Acute on chronic combined systolic (congestive) and diastolic (congestive) heart failure: Secondary | ICD-10-CM | POA: Diagnosis not present

## 2024-03-18 DIAGNOSIS — I4819 Other persistent atrial fibrillation: Secondary | ICD-10-CM | POA: Diagnosis not present

## 2024-03-18 DIAGNOSIS — I509 Heart failure, unspecified: Secondary | ICD-10-CM | POA: Diagnosis not present

## 2024-03-18 DIAGNOSIS — I5021 Acute systolic (congestive) heart failure: Secondary | ICD-10-CM | POA: Diagnosis not present

## 2024-03-18 DIAGNOSIS — I5023 Acute on chronic systolic (congestive) heart failure: Secondary | ICD-10-CM | POA: Diagnosis not present

## 2024-03-18 LAB — BASIC METABOLIC PANEL WITH GFR
Anion gap: 7 (ref 5–15)
BUN: 28 mg/dL — ABNORMAL HIGH (ref 8–23)
CO2: 27 mmol/L (ref 22–32)
Calcium: 8.7 mg/dL — ABNORMAL LOW (ref 8.9–10.3)
Chloride: 105 mmol/L (ref 98–111)
Creatinine, Ser: 2.29 mg/dL — ABNORMAL HIGH (ref 0.61–1.24)
GFR, Estimated: 28 mL/min — ABNORMAL LOW (ref >60)
Glucose, Bld: 177 mg/dL — ABNORMAL HIGH (ref 70–99)
Potassium: 3.6 mmol/L (ref 3.5–5.1)
Sodium: 139 mmol/L (ref 135–145)

## 2024-03-18 LAB — ECHOCARDIOGRAM COMPLETE
Area-P 1/2: 3.68 cm2
Calc EF: 32.3 %
Height: 71 in
MV M vel: 3.69 m/s
MV Peak grad: 54.5 mmHg
Radius: 0.6 cm
S' Lateral: 6.7 cm
Single Plane A2C EF: 34.9 %
Single Plane A4C EF: 27.7 %
Weight: 2928 [oz_av]

## 2024-03-18 LAB — PROTIME-INR
INR: 2.8 — ABNORMAL HIGH (ref 0.8–1.2)
Prothrombin Time: 30.8 s — ABNORMAL HIGH (ref 11.4–15.2)

## 2024-03-18 LAB — CBC
HCT: 35.3 % — ABNORMAL LOW (ref 39.0–52.0)
Hemoglobin: 11.6 g/dL — ABNORMAL LOW (ref 13.0–17.0)
MCH: 29.3 pg (ref 26.0–34.0)
MCHC: 32.9 g/dL (ref 30.0–36.0)
MCV: 89.1 fL (ref 80.0–100.0)
Platelets: 102 K/uL — ABNORMAL LOW (ref 150–400)
RBC: 3.96 MIL/uL — ABNORMAL LOW (ref 4.22–5.81)
RDW: 14.3 % (ref 11.5–15.5)
WBC: 4.3 K/uL (ref 4.0–10.5)
nRBC: 0 % (ref 0.0–0.2)

## 2024-03-18 LAB — CBG MONITORING, ED
Glucose-Capillary: 201 mg/dL — ABNORMAL HIGH (ref 70–99)
Glucose-Capillary: 128 mg/dL — ABNORMAL HIGH (ref 70–99)

## 2024-03-18 LAB — GLUCOSE, CAPILLARY
Glucose-Capillary: 135 mg/dL — ABNORMAL HIGH (ref 70–99)
Glucose-Capillary: 132 mg/dL — ABNORMAL HIGH (ref 70–99)

## 2024-03-18 LAB — HEMOGLOBIN A1C
Hgb A1c MFr Bld: 7.5 % — ABNORMAL HIGH (ref 4.8–5.6)
Mean Plasma Glucose: 168.55 mg/dL

## 2024-03-18 MED ORDER — WARFARIN SODIUM 5 MG PO TABS
5.0000 mg | ORAL_TABLET | Freq: Once | ORAL | Status: AC
  Administered 2024-03-18: 5 mg via ORAL
  Filled 2024-03-18 (×2): qty 1

## 2024-03-18 NOTE — Progress Notes (Signed)
°  Echocardiogram 2D Echocardiogram has been performed.  Carlos Gibson 03/18/2024, 3:51 PM

## 2024-03-18 NOTE — Progress Notes (Signed)
 ANTICOAGULATION CONSULT NOTE  Pharmacy Consult for Warfarin Indication: atrial flutter  Allergies[1]  Patient Measurements: Height: 5' 11 (180.3 cm) Weight: 83 kg (183 lb) IBW/kg (Calculated) : 75.3  Vital Signs: Temp: 97.6 F (36.4 C) (12/13 0716) Temp Source: Oral (12/13 0716) BP: 116/63 (12/13 0730) Pulse Rate: 69 (12/13 0730)  Labs: Recent Labs    03/16/24 1518 03/17/24 1708 03/17/24 2138 03/18/24 0028 03/18/24 0356  HGB  --  11.7*  --  11.6*  --   HCT  --  35.9*  --  35.3*  --   PLT  --  106*  --  102*  --   LABPROT  --   --  32.5* 30.8*  --   INR 2.8  --  3.0* 2.8*  --   CREATININE  --  2.53*  --   --  2.29*    Estimated Creatinine Clearance: 26 mL/min (A) (by C-G formula based on SCr of 2.29 mg/dL (H)).   Medical History: Past Medical History:  Diagnosis Date   Aortic regurgitation    a. mild AI by echo 01/2016.   Aortic root dilation    Aortic valve regurgitation    Bifascicular block    Chronic systolic CHF (congestive heart failure) (HCC)    CKD (chronic kidney disease), stage III (HCC) 01/01/2016   Diabetes (HCC)    Ejection fraction < 50%    Frequent PVCs    Gilbert's syndrome    Hyperlipidemia    Mild diastolic dysfunction    Mitral regurgitation    a. mod by echo 10/207.   NICM (nonischemic cardiomyopathy) (HCC)    Pericardial effusion    a. small by echo 01/2016.   Pulmonary hypertension (HCC)    Right BBB/left post fasc block    Thrombocytopenia     Medications:  (Not in a hospital admission)  Scheduled:   alfuzosin   10 mg Oral Daily   amiodarone   200 mg Oral Daily   atorvastatin   10 mg Oral QHS   bisoprolol   5 mg Oral Daily   cyanocobalamin   1,000 mcg Oral Daily   finasteride   5 mg Oral Daily   furosemide   40 mg Intravenous Q12H   hydrALAZINE   25 mg Oral BID   insulin  aspart  0-9 Units Subcutaneous TID WC   isosorbide  mononitrate  30 mg Oral Daily   Warfarin - Pharmacist Dosing Inpatient   Does not apply q1600    Infusions:   sodium chloride      PRN: sodium chloride , acetaminophen , ondansetron  (ZOFRAN ) IV  Assessment: 42 yom with a history of AF on warfarin, HF, ICD, HTN. Patient is presenting with SOB. Warfarin per pharmacy consult placed for atrial flutter.  Patient taking warfarin prior to arrival. Home dose is 5 mg (5 mg x 1) every Tue, Thu, Sat; 2.5 mg (5 mg x 0.5) all other days. Last taken 12/11 evening per patient.  INR this AM therapeutic at 2.8  Goal of Therapy:  INR Goal 2-3 Monitor platelets by anticoagulation protocol: Yes   Plan:  Warfarin 5mg  x 1 today Daily INR, s/s bleeding  Dorn Poot, PharmD, [facility] Clinical Pharmacist ED Pharmacist Phone # [phone] 03/18/2024 10:05 AM         [1]  Allergies Allergen Reactions   Ace Inhibitors Cough   Coreg  [Carvedilol ] Other (See Comments)    feels bad   Metformin  Hcl Diarrhea   Sulfa Antibiotics Other (See Comments)    Patient cannot recall symptoms   Toprol  Xl [Metoprolol  Tartrate]  Other (See Comments)    feels bad

## 2024-03-18 NOTE — Progress Notes (Signed)
 [facility]  PROGRESS NOTE  Carlos Gibson FMW:[id number] DOB: [birth year] DOA: 03/17/2024 PCP: Charlott Dorn LABOR, MD   Brief HPI:    83 y.o. male with medical history significant of chronic systolic CHF attributed to be secondary to nonischemic cardiomyopathy, history of trifascicular block status AICD, persistent A-fib/flutter on anticoagulation, chronic kidney disease stage IIIb, diabetes mellitus, pulmonary hypertension, hypertension, dyslipidemia.  He follows up with Dr. Inocencio.  He has been in his usual state of health until over the past couple of weeks, he has noted worsening exertional dyspnea.  This has progressed to shortness of breath at rest.  He admitted to recent orthopnea and paroxysmal nocturnal dyspnea.  He had sit up in his recliner to sleep last night which is unusual from his baseline.  He admits to being compliant with his medications including doubling up on his furosemide  when symptoms worsen.  He admitted to eating some salt over the Thanksgiving holidays.     Assessment/Plan:    Acute on chronic systolic CHF exacerbation:  Last known ejection fraction of 25 to 30%.   Known history of nonischemic cardiomyopathy.  Status post AICD.   Patient has been noncompliant with diet over the holidays.  Started on IV Lasix  40 mg every 12 hours Cardiology consulted    Acute on chronic kidney injury stage 3b:  Likely cardiorenal and postobstructive in etiology Renal ultrasound showed bilateral hydronephrosis with prostamegaly Insert Foley catheter Follow BMP in am   Persistent atrial flutter/fibrillation:  Rate controlled on amiodarone .   Patient is also anticoagulated with Coumadin .   INR was noted to be 3.   Thrombocytopenia:  Asymptomatic at this time.   Patient is on Coumadin  at baseline.   INR therapeutic at 3.   Diabetes mellitus with hyperglycemia:  Patient is not on any chronic medications.   Sliding scale insulin  NovoLog  Hemoglobin A1c  7.5  Abnormal TSH from labs of 12/2023.   Likely may be related to amiodarone .   Will check TSH      DVT prophylaxis: Warfarin  Medications     alfuzosin   10 mg Oral Daily   amiodarone   200 mg Oral Daily   atorvastatin   10 mg Oral QHS   bisoprolol   5 mg Oral Daily   cyanocobalamin   1,000 mcg Oral Daily   finasteride   5 mg Oral Daily   furosemide   40 mg Intravenous Q12H   hydrALAZINE   25 mg Oral BID   insulin  aspart  0-9 Units Subcutaneous TID WC   isosorbide  mononitrate  30 mg Oral Daily   sodium chloride  flush  3 mL Intravenous Q12H   Warfarin - Pharmacist Dosing Inpatient   Does not apply q1600     Data Reviewed:   CBG:  Recent Labs  Lab 03/18/24 0811  GLUCAP 201*    SpO2: 100 %    Vitals:   03/18/24 0252 03/18/24 0343 03/18/24 0716 03/18/24 0730  BP:  110/67 112/61 116/63  Pulse: 70 69 70 69  Resp: 19 11 15  (!) 24  Temp: 97.9 F (36.6 C)  97.6 F (36.4 C)   TempSrc: Oral  Oral   SpO2: 100% 100% 100% 100%  Weight:      Height:          Data Reviewed:  Basic Metabolic Panel: Recent Labs  Lab 03/17/24 1708 03/18/24 0356  NA 139 139  K 3.5 3.6  CL 106 105  CO2 24 27  GLUCOSE 228* 177*  BUN 30* 28*  CREATININE 2.53*  2.29*  CALCIUM  8.4* 8.7*    CBC: Recent Labs  Lab 03/17/24 1708 03/18/24 0028  WBC 3.7* 4.3  HGB 11.7* 11.6*  HCT 35.9* 35.3*  MCV 90.4 89.1  PLT 106* 102*    LFT No results for input(s): AST, ALT, ALKPHOS, BILITOT, PROT, ALBUMIN in the last 168 hours.   Antibiotics: Anti-infectives (From admission, onward)    None        CONSULTS cardiology  Code Status: Full code  Family Communication: Discussed with patient's daughter      Subjective   Denies chest pain or shortness of breath   Objective    Physical Examination:   General-appears in no acute distress Heart-S1-S2, regular, no murmur auscultated Lungs-decreased breath sounds at lung bases Abdomen-soft, nontender, no  organomegaly Extremities-no edema in the lower extremities Neuro-alert, oriented x3, no focal deficit noted            Kiyomi Pallo S Shameka Aggarwal   [facility] If 7PM-7AM, please contact night-coverage at [email or web address], Office  [phone]   03/18/2024, 8:42 AM  LOS: 1 day

## 2024-03-18 NOTE — Consult Note (Addendum)
 As below, patient seen and examined.  Briefly he is an 83 year old male with past medical history of nonischemic cardiomyopathy, paroxysmal atrial fibrillation/flutter, mitral regurgitation, prior ICD, chronic stage III kidney disease for evaluation of acute on chronic combined systolic/diastolic congestive heart failure.  Patient states that over the past 2 weeks he has noticed progressive dyspnea on exertion.  Also with orthopnea and minimal pedal edema.  He denies weight gain.  He denies chest pain, palpitations, syncope or bleeding.  He has been admitted and cardiology asked to evaluate.  Significant mitral regurgitation murmur on examination.  Chest x-ray shows central congestion and bilateral pleural effusions.  Creatinine 2.29, potassium 3.6, hemoglobin 11.6, INR 2.8, ECG shows AV pacing.  BNP 3081.  1 acute on chronic combined systolic/diastolic congestive heart failure-patient presents with CHF symptoms and BNP elevated with small pleural effusions noted on chest x-ray.  Continue Lasix  40 mg IV twice daily.  Follow renal function closely.  Will add Jardiance  10 mg daily.  2 nonischemic cardiomyopathy-we will plan repeat echocardiogram.  Continue bisoprolol .  He is not on ARB or Entresto  due to severity of renal insufficiency.  Continue hydralazine /nitrates.  3 mitral regurgitation murmur-repeat echocardiogram to reassess MR.  4 chronic stage III kidney disease-follow renal function closely with diuresis.  5 paroxysmal atrial fibrillation-continue amiodarone  and Coumadin  with goal INR 2-3.  Patient is in AV paced rhythm at present.  6 history of ICD  Redell Shallow, MD   Cardiology Consultation   Patient ID: Carlos Gibson MRN: [id number]; DOB: [birth year]  Admit date: 03/17/2024 Date of Consult: 03/18/2024  PCP:  Charlott Dorn LABOR, MD   [facility] Providers Cardiologist:  Oneil Parchment, MD  Cardiology APP:  Janene Boer, [place]  Electrophysiologist:  Will Gladis Norton,  MD       Patient Profile: Carlos Gibson is a 83 y.o. male with a hx of persistent Afib/flutter, chronic anticoagulation, gilbert's syndrome, MR, chronic systolic heart failure secondary to NICM, trifascicular block, ICD in place, CKD 3 who is being seen 03/18/2024 for the evaluation of CHF at the request of Dr. Drusilla.  History of Present Illness: [name] has a history of chronic systolic heart failure.echo 10/2014 demonstrated LVEF 40% with mild to moderate MR, PASP 60 mmHg, trivial pericardial effusion.  Left atrium is severely dilated.  He underwent right left heart catheterization 12/20/2014 that showed an LVEF 25-30% with global hypokinesis, moderately elevated pulmonary pressures and no angiographically significant CAD.  He was felt to have a nonischemic cardiomyopathy.  Follow-up echocardiogram 05/2015 continued to show LVEF 35-40%.  Cardiac MRI on 12/2017 showed severely dilated LV with systolic function 34%, diffuse hypokinesis no LGE and severe biatrial dilation.  Moderate MR was noted.  Given persistent systolic dysfunction he underwent BiV ICD insertion (CRT-D) on 01/26/2018 with gen change on 04/2023.   VT was noted on device interrogation 03/2019.  Last echo 11/2021 with LVEF 30% and severe functional MR.   He has had persistent Afb noted on ICD since 03/22/23. He underwent DCCV on 06/24/23. Persistent atrial flutter noted 10/15/23. DCCV at that time was deferred due to ongoing hematuria. He converted to AV dual paced rhythm and DCCV was not pursued.   He is maintained on 200 mg amiodarone  daily and 5 mg bisoprolol  daily.  He is anticoagulated with coumadin .   He presented to [facility] 03/17/24 with SOB that progressed to orthopnea despite medication compliance.   Workup revealed acute on chronic kidney injury with  sCr 2.53 --> 2.29 (  1.74), K 3.5. BNP 3081 INR 3.0  A1c 7.5%  Hb 11.6  Has received 40 mg IV lasix  x 2 doses. Foley placed for suspected outlet obstruction/retention. I&Os  inaccurate. He reports improved breathing after lasix . He denies chest pain but does report palpitations that wake him from sleep.    Past Medical History:  Diagnosis Date   Aortic regurgitation    a. mild AI by echo 01/2016.   Aortic root dilation    Aortic valve regurgitation    Bifascicular block    Chronic systolic CHF (congestive heart failure) (HCC)    CKD (chronic kidney disease), stage III (HCC) 01/01/2016   Diabetes (HCC)    Ejection fraction < 50%    Frequent PVCs    Gilbert's syndrome    Hyperlipidemia    Mild diastolic dysfunction    Mitral regurgitation    a. mod by echo 10/207.   NICM (nonischemic cardiomyopathy) (HCC)    Pericardial effusion    a. small by echo 01/2016.   Pulmonary hypertension (HCC)    Right BBB/left post fasc block    Thrombocytopenia     Past Surgical History:  Procedure Laterality Date   BIV ICD GENERATOR CHANGEOUT N/A 04/29/2023   Procedure: BIV ICD GENERATOR CHANGEOUT;  Surgeon: Inocencio Soyla Lunger, MD;  Location: [facility] INVASIVE CV LAB;  Service: Cardiovascular;  Laterality: N/A;   BIV ICD INSERTION CRT-D N/A 01/26/2018   Procedure: BIV ICD INSERTION CRT-D;  Surgeon: Inocencio Soyla Lunger, MD;  Location: [facility] INVASIVE CV LAB;  Service: Cardiovascular;  Laterality: N/A;   CARDIAC CATHETERIZATION N/A 12/20/2014   Procedure: Right/Left Heart Cath and Coronary Angiography;  Surgeon: Oneil JAYSON Parchment, MD;  Location: [facility] INVASIVE CV LAB;  Service: Cardiovascular;  Laterality: N/A;   CARDIOVERSION N/A 10/24/2018   Procedure: CARDIOVERSION;  Surgeon: Alveta Aleene PARAS, MD;  Location: [facility] ENDOSCOPY;  Service: Cardiovascular;  Laterality: N/A;   CARDIOVERSION N/A 05/13/2022   Procedure: CARDIOVERSION;  Surgeon: Lonni Slain, MD;  Location: [facility] ENDOSCOPY;  Service: Cardiovascular;  Laterality: N/A;   CARDIOVERSION N/A 06/24/2023   Procedure: CARDIOVERSION;  Surgeon: Pietro Redell RAMAN, MD;  Location: [facility] INVASIVE CV LAB;  Service: Cardiovascular;  Laterality:  N/A;   HERNIA REPAIR  06/2017     Home Medications:  Prior to Admission medications  Medication Sig Start Date End Date Taking? Authorizing Provider  alfuzosin  (UROXATRAL ) 10 MG 24 hr tablet Take 10 mg by mouth daily. 11/11/23  Yes [provider]  amiodarone  (PACERONE ) 200 MG tablet Take 1 tablet (200 mg total) by mouth daily. 08/02/23  Yes Camnitz, Will Lunger, MD  atorvastatin  (LIPITOR) 10 MG tablet Take 10 mg by mouth in the morning.   Yes [provider]  bisoprolol  (ZEBETA ) 5 MG tablet Take 1 tablet (5 mg total) by mouth daily. 03/07/24  Yes Camnitz, Soyla Lunger, MD  cholecalciferol  (VITAMIN D3) 25 MCG (1000 UNIT) tablet Take 1,000 Units by mouth daily. Patient taking differently: Take 1,000 Units by mouth every other day.   Yes [provider]  cyanocobalamin  (VITAMIN B12) 1000 MCG tablet Take 1,000 mcg by mouth daily. Patient taking differently: Take 1,000 mcg by mouth every other day.   Yes [provider]  finasteride  (PROSCAR ) 5 MG tablet Take 5 mg by mouth in the morning. 04/28/23  Yes [provider]  furosemide  (LASIX ) 40 MG tablet Take 1 tablet (40 mg total) by mouth daily as needed for fluid. Must have office visit with Dr Parchment for further refills or  obtain from PCP 06/11/23  Yes Jeffrie Oneil BROCKS, MD  hydrALAZINE  (APRESOLINE ) 25 MG tablet Take 25 mg by mouth 2 (two) times daily.   Yes [provider]  isosorbide  mononitrate (IMDUR ) 30 MG 24 hr tablet Take 30 mg by mouth daily.   Yes [provider]  warfarin (COUMADIN ) 5 MG tablet TAKE 1/2 TO 1 TABLET BY MOUTH ONCE DAILY AS DIRECTED by anticoagulation clinic Patient taking differently: Take 2.5-5 mg by mouth at bedtime. 5 mg (5 mg x 1) every Tue, Thu, Sat; 2.5 mg (5 mg x 0.5) all other days 06/25/23  Yes Jeffrie Oneil BROCKS, MD    Scheduled Meds:  alfuzosin   10 mg Oral Daily   amiodarone   200 mg Oral Daily   atorvastatin   10 mg Oral QHS   bisoprolol   5 mg Oral Daily    cyanocobalamin   1,000 mcg Oral Daily   finasteride   5 mg Oral Daily   furosemide   40 mg Intravenous Q12H   hydrALAZINE   25 mg Oral BID   insulin  aspart  0-9 Units Subcutaneous TID WC   isosorbide  mononitrate  30 mg Oral Daily   sodium chloride  flush  3 mL Intravenous Q12H   Warfarin - Pharmacist Dosing Inpatient   Does not apply q1600   Continuous Infusions:  sodium chloride      PRN Meds: sodium chloride , acetaminophen , ondansetron  (ZOFRAN ) IV, sodium chloride  flush  Allergies:   Allergies[1]  Social History:   Social History   Socioeconomic History   Marital status: Married    Spouse name: Not on file   Number of children: Not on file   Years of education: Not on file   Highest education level: Not on file  Occupational History   Not on file  Tobacco Use   Smoking status: Never    Passive exposure: Past   Smokeless tobacco: Never   Tobacco comments:    Never smoked 05/17/23  Vaping Use   Vaping status: Never Used  Substance and Sexual Activity   Alcohol use: No    Alcohol/week: 0.0 standard drinks of alcohol   Drug use: No   Sexual activity: Not on file  Other Topics Concern   Not on file  Social History Narrative   Not on file   Social Drivers of Health   Tobacco Use: Low Risk (03/17/2024)   Patient History    Smoking Tobacco Use: Never    Smokeless Tobacco Use: Never    Passive Exposure: Past  Financial Resource Strain: Not on file  Food Insecurity: Not on file  Transportation Needs: Not on file  Physical Activity: Not on file  Stress: Not on file  Social Connections: Not on file  Intimate Partner Violence: Not on file  Depression (EYV7-0): Not on file  Alcohol Screen: Not on file  Housing: Not on file  Utilities: Not on file  Health Literacy: Not on file    Family History:    Family History  Problem Relation Age of Onset   Hypertension Mother    Stroke Mother    Irregular heart beat Father    Unexplained death Father      ROS:  Please  see the history of present illness.   All other ROS reviewed and negative.     Physical Exam/Data: Vitals:   03/17/24 2300 03/18/24 0252 03/18/24 0343 03/18/24 0716  BP: 117/63  110/67 112/61  Pulse: 69 70 69 70  Resp: (!) 25 19 11 15   Temp:  97.9 F (36.6  C)  97.6 F (36.4 C)  TempSrc:  Oral  Oral  SpO2: 100% 100% 100% 100%  Weight:      Height:        Intake/Output Summary (Last 24 hours) at 03/18/2024 0741 Last data filed at 03/18/2024 0300 Gross per 24 hour  Intake --  Output 375 ml  Net -375 ml      03/17/2024    5:06 PM 12/15/2023    1:36 PM 11/10/2023    1:30 PM  Last 3 Weights  Weight (lbs) 183 lb 166 lb 3.2 oz 160 lb 12.8 oz  Weight (kg) 83.008 kg 75.388 kg 72.938 kg     Body mass index is 25.52 kg/m.  General:  Well nourished, well developed, in no acute distress HEENT: normal Neck: no JVD Vascular: No carotid bruits; Distal pulses 2+ bilaterally Cardiac:  regular rhythm, regular rate Lungs:  diminished in bases  Abd: soft, nontender, no hepatomegaly  Ext: no edema Musculoskeletal:  No deformities, BUE and BLE strength normal and equal Skin: warm and dry  Neuro:  CNs 2-12 intact, no focal abnormalities noted Psych:  Normal affect   EKG:  The EKG was personally reviewed and demonstrates:  Paced rhythm Telemetry:  Telemetry was personally reviewed and demonstrates:  paced rhythm  Relevant CV Studies:  Echo pending  Laboratory Data: High Sensitivity Troponin:  No results for input(s): TROPONINIHS in the last 720 hours. No results for input(s): TRNPT in the last 720 hours.    Chemistry Recent Labs  Lab 03/17/24 1708 03/18/24 0356  NA 139 139  K 3.5 3.6  CL 106 105  CO2 24 27  GLUCOSE 228* 177*  BUN 30* 28*  CREATININE 2.53* 2.29*  CALCIUM  8.4* 8.7*  GFRNONAA 25* 28*  ANIONGAP 9 7    No results for input(s): PROT, ALBUMIN, AST, ALT, ALKPHOS, BILITOT in the last 168 hours. Lipids No results for input(s): CHOL, TRIG,  HDL, LABVLDL, LDLCALC, CHOLHDL in the last 168 hours.  Hematology Recent Labs  Lab 03/17/24 1708 03/18/24 0028  WBC 3.7* 4.3  RBC 3.97* 3.96*  HGB 11.7* 11.6*  HCT 35.9* 35.3*  MCV 90.4 89.1  MCH 29.5 29.3  MCHC 32.6 32.9  RDW 14.2 14.3  PLT 106* 102*   Thyroid  No results for input(s): TSH, FREET4 in the last 168 hours.  BNP Recent Labs  Lab 03/17/24 1708  BNP 3,081.4*    DDimer No results for input(s): DDIMER in the last 168 hours.  Radiology/Studies:  US  RENAL Result Date: 03/18/2024 EXAM: US  Retroperitoneum Complete, Renal. 03/18/2024 01:47:21 AM TECHNIQUE: Real-time ultrasonography of the retroperitoneum renal was performed. COMPARISON: CT abdomen/pelvis dated 12/23/2023 CLINICAL HISTORY: Acute renal failure (ARF) FINDINGS: FINDINGS: RIGHT KIDNEY/URETER: The right kidney measures 10.4 x 5.3 x 5.4 cm with a volume of 157 mL. Normal cortical echogenicity. Mild hydronephrosis. No calculus. Simple 2.1 cm interpolar right renal cyst. LEFT KIDNEY/URETER: The left kidney measures 11.4 x 6.1 x 5.8 cm with a volume of 212 mL. Normal cortical echogenicity. Mild hydronephrosis. No calculus. Simple 3.2 cm interpolar left renal cyst. BLADDER: Post-void residual volume is 649 mL. Prostatomegaly, suggesting BPH. Unremarkable appearance of the bladder. IMPRESSION: 1. Mild bilateral hydronephrosis, likely secondary to bladder outlet obstruction. 2. Post-void bladder residual of 649 mL. 3. Prostatomegaly, suggesting benign prostatic hyperplasia. Electronically signed by: Pinkie Pebbles MD 03/18/2024 01:53 AM EST RP Workstation: [id number]   DG Chest 2 View Result Date: 03/17/2024 CLINICAL DATA:  Shortness of breath EXAM: CHEST - 2 VIEW  COMPARISON:  01/27/2018 FINDINGS: Left-sided multi lead cardiac pacing device. Cardiomegaly with central congestion. Small bilateral effusions. Aortic atherosclerosis IMPRESSION: Cardiomegaly with central congestion and small bilateral effusions.  Electronically Signed   By: Luke Bun M.D.   On: 03/17/2024 18:58     Assessment and Plan:  Acute on chronic systolic heart failure Acute on chronic kidney disease 3b - known persistent LVEF 30% with ICD in place - now with symptoms of CHF exacerbation, elevated BNP, and AoCKD - has diuresed with 40 mg IV lasix  x 2 doses, I&Os incomplete - repeat echo pending - he reports palpitations, will interrogate device to determine if return of Afib/flutter could be contributing - question if renal injury due to obstruction/retention, now with foley catheter in place - recommend continuation of IV diuresis today - consider holding bisoprolol  until after echo, extremities warm   Hypertension - continue 25 mg hydralazine  BID, 30 mg imdur  - consider holding bisoprolol    Persistent atrial flutter/fibrillation - persistent on ICD since 03/2023 - DCCV 06/24/23 --> persistent atrial flutter starting 10/15/23, DCCV was deferred at that time due to hematuria - has been on amiodarone , and converted to a paced rhythm - maintained on bisoprolol  and amiodarone  - continue amiodarone  for now   Need for chronic anticoagulation - continue coumadin  per pharmacy   Conduction disease with trifascicular block  - CRT-D in place - will interrogate device to see if return of fib/flutter could possibly be contributing to CHF    Risk Assessment/Risk Scores:  New [place]  Heart Association (NYHA) Functional Class NYHA Class IV  CHA2DS2-VASc Score = 5   This indicates a 7.2% annual risk of stroke. The patient's score is based upon: CHF History: 1 HTN History: 1 Diabetes History: 1 Stroke History: 0 Vascular Disease History: 0 Age Score: 2 Gender Score: 0       For questions or updates, please contact [facility] Please consult [email or web address] for contact info under      Signed, Jon Nat Hails, PA  03/18/2024 7:41 AM     [1]  Allergies Allergen Reactions   Ace Inhibitors  Cough   Coreg  [Carvedilol ] Other (See Comments)    feels bad   Metformin  Hcl Diarrhea   Sulfa Antibiotics Other (See Comments)    Patient cannot recall symptoms   Toprol  Xl [Metoprolol  Tartrate] Other (See Comments)    feels bad

## 2024-03-18 NOTE — ED Notes (Signed)
 [employer] report uploaded to the patient's chart.  A copy was placed in the medical records drawer.

## 2024-03-19 LAB — CBC
HCT: 34 % — ABNORMAL LOW (ref 39.0–52.0)
Hemoglobin: 11.1 g/dL — ABNORMAL LOW (ref 13.0–17.0)
MCH: 29.1 pg (ref 26.0–34.0)
MCHC: 32.6 g/dL (ref 30.0–36.0)
MCV: 89 fL (ref 80.0–100.0)
Platelets: 103 K/uL — ABNORMAL LOW (ref 150–400)
RBC: 3.82 MIL/uL — ABNORMAL LOW (ref 4.22–5.81)
RDW: 14.6 % (ref 11.5–15.5)
WBC: 4.7 K/uL (ref 4.0–10.5)
nRBC: 0 % (ref 0.0–0.2)

## 2024-03-19 LAB — BASIC METABOLIC PANEL WITH GFR
Anion gap: 10 (ref 5–15)
BUN: 29 mg/dL — ABNORMAL HIGH (ref 8–23)
CO2: 24 mmol/L (ref 22–32)
Calcium: 8.9 mg/dL (ref 8.9–10.3)
Chloride: 105 mmol/L (ref 98–111)
Creatinine, Ser: 2.15 mg/dL — ABNORMAL HIGH (ref 0.61–1.24)
GFR, Estimated: 30 mL/min — ABNORMAL LOW (ref >60)
Glucose, Bld: 151 mg/dL — ABNORMAL HIGH (ref 70–99)
Potassium: 3.5 mmol/L (ref 3.5–5.1)
Sodium: 139 mmol/L (ref 135–145)

## 2024-03-19 LAB — GLUCOSE, CAPILLARY
Glucose-Capillary: 135 mg/dL — ABNORMAL HIGH (ref 70–99)
Glucose-Capillary: 183 mg/dL — ABNORMAL HIGH (ref 70–99)
Glucose-Capillary: 110 mg/dL — ABNORMAL HIGH (ref 70–99)
Glucose-Capillary: 158 mg/dL — ABNORMAL HIGH (ref 70–99)

## 2024-03-19 LAB — PROTIME-INR
INR: 3.5 — ABNORMAL HIGH (ref 0.8–1.2)
Prothrombin Time: 36.5 s — ABNORMAL HIGH (ref 11.4–15.2)

## 2024-03-19 LAB — TSH: TSH: 10.661 u[IU]/mL — ABNORMAL HIGH (ref 0.350–4.500)

## 2024-03-19 MED ORDER — EMPAGLIFLOZIN 10 MG PO TABS
10.0000 mg | ORAL_TABLET | Freq: Every day | ORAL | Status: DC
  Administered 2024-03-19 – 2024-03-20 (×2): 10 mg via ORAL
  Filled 2024-03-19 (×2): qty 1

## 2024-03-19 MED ORDER — CHLORHEXIDINE GLUCONATE CLOTH 2 % EX PADS
6.0000 | MEDICATED_PAD | Freq: Every day | CUTANEOUS | Status: DC
  Administered 2024-03-19 – 2024-03-21 (×3): 6 via TOPICAL

## 2024-03-19 NOTE — Progress Notes (Signed)
 Rounding Note   Patient Name: Carlos Gibson Date of Encounter: 03/19/2024  [facility] Cardiologist: Oneil Parchment, MD   Subjective Dyspnea improving; no CP  Scheduled Meds:  alfuzosin   10 mg Oral Daily   amiodarone   200 mg Oral Daily   atorvastatin   10 mg Oral QHS   bisoprolol   5 mg Oral Daily   cyanocobalamin   1,000 mcg Oral Daily   finasteride   5 mg Oral Daily   furosemide   40 mg Intravenous Q12H   hydrALAZINE   25 mg Oral BID   insulin  aspart  0-9 Units Subcutaneous TID WC   isosorbide  mononitrate  30 mg Oral Daily   Warfarin - Pharmacist Dosing Inpatient   Does not apply q1600   Continuous Infusions:  PRN Meds: acetaminophen , ondansetron  (ZOFRAN ) IV   Vital Signs  Vitals:   03/18/24 2336 03/19/24 0400 03/19/24 0500 03/19/24 0835  BP: 99/71 111/68  (!) 108/54  Pulse: 69 71  69  Resp: 20 19  16   Temp: (!) 97.4 F (36.3 C) 97.8 F (36.6 C)  98 F (36.7 C)  TempSrc: Oral Oral  Oral  SpO2: 100% 99%  100%  Weight:   74.3 kg   Height:        Intake/Output Summary (Last 24 hours) at 03/19/2024 0904 Last data filed at 03/19/2024 0844 Gross per 24 hour  Intake 120 ml  Output 3020 ml  Net -2900 ml      03/19/2024    5:00 AM 03/17/2024    5:06 PM 12/15/2023    1:36 PM  Last 3 Weights  Weight (lbs) 163 lb 12.8 oz 183 lb 166 lb 3.2 oz  Weight (kg) 74.3 kg 83.008 kg 75.388 kg      Telemetry AV pacing - Personally Reviewed   Physical Exam  GEN: No acute distress.   Neck: No JVD Cardiac: RRR, 3/6 systolic murmur apex Respiratory: Clear to auscultation bilaterally. GI: Soft, nontender, non-distended  MS: No edema. Neuro:  Nonfocal  Psych: Normal affect   Labs     Chemistry Recent Labs  Lab 03/17/24 1708 03/18/24 0356 03/19/24 0232  NA 139 139 139  K 3.5 3.6 3.5  CL 106 105 105  CO2 24 27 24   GLUCOSE 228* 177* 151*  BUN 30* 28* 29*  CREATININE 2.53* 2.29* 2.15*  CALCIUM  8.4* 8.7* 8.9  GFRNONAA 25* 28* 30*  ANIONGAP 9 7 10       Hematology Recent Labs  Lab 03/17/24 1708 03/18/24 0028 03/19/24 0232  WBC 3.7* 4.3 4.7  RBC 3.97* 3.96* 3.82*  HGB 11.7* 11.6* 11.1*  HCT 35.9* 35.3* 34.0*  MCV 90.4 89.1 89.0  MCH 29.5 29.3 29.1  MCHC 32.6 32.9 32.6  RDW 14.2 14.3 14.6  PLT 106* 102* 103*   Thyroid   Recent Labs  Lab 03/19/24 0232  TSH 10.661*    BNP Recent Labs  Lab 03/17/24 1708  BNP 3,081.4*    Radiology  ECHOCARDIOGRAM COMPLETE Result Date: 03/18/2024    ECHOCARDIOGRAM REPORT   Patient Name:   Carlos Gibson Lavoy Date of Exam: 03/18/2024 Medical Rec #:  [id number]     Height:       71.0 in Accession #:    [id number]    Weight:       183.0 lb Date of Birth:  [birth year]     BSA:          2.030 m Patient Age:    83 years      BP:  120/65 mmHg Patient Gender: M             HR:           69 bpm. Exam Location:  Inpatient Procedure: 2D Echo (Both Spectral and Color Flow Doppler were utilized during            procedure). Indications:    acute systolic chf  History:        Patient has prior history of Echocardiogram examinations, most                 recent 11/06/2021. Cardiomyopathy and CHF, Defibrillator and                 Pacemaker, Arrythmias:RBBB, PVC and Atrial Fibrillation; Risk                 Factors:Dyslipidemia and Hypertension.  Sonographer:    Tinnie Barefoot RDCS Referring Phys: [id number] MAUDE MARLA DART  Sonographer Comments: Suboptimal subcostal window. IMPRESSIONS  1. Global hypokinesis with akinesis of the inferolateral wall; overall severe LV dysfunction.  2. Left ventricular ejection fraction, by estimation, is 25 to 30%. The left ventricle has severely decreased function. The left ventricle demonstrates regional wall motion abnormalities (see scoring diagram/findings for description). The left ventricular internal cavity size was severely dilated. Left ventricular diastolic parameters are consistent with Grade II diastolic dysfunction (pseudonormalization). There is the interventricular  septum is flattened in systole and diastole, consistent with right ventricular pressure and volume overload.  3. Right ventricular systolic function is mildly reduced. The right ventricular size is normal. There is severely elevated pulmonary artery systolic pressure.  4. Left atrial size was severely dilated.  5. Right atrial size was severely dilated.  6. Moderate pleural effusion in the left lateral region.  7. The mitral valve is normal in structure. Moderate to severe mitral valve regurgitation. No evidence of mitral stenosis.  8. Tricuspid valve regurgitation is moderate.  9. The aortic valve is tricuspid. Aortic valve regurgitation is mild. No aortic stenosis is present. 10. The inferior vena cava is normal in size with greater than 50% respiratory variability, suggesting right atrial pressure of 3 mmHg. FINDINGS  Left Ventricle: Left ventricular ejection fraction, by estimation, is 25 to 30%. The left ventricle has severely decreased function. The left ventricle demonstrates regional wall motion abnormalities. The left ventricular internal cavity size was severely dilated. There is no left ventricular hypertrophy. The interventricular septum is flattened in systole and diastole, consistent with right ventricular pressure and volume overload. Left ventricular diastolic parameters are consistent with Grade II diastolic dysfunction (pseudonormalization). Right Ventricle: The right ventricular size is normal. Right ventricular systolic function is mildly reduced. There is severely elevated pulmonary artery systolic pressure. The tricuspid regurgitant velocity is 4.68 m/s, and with an assumed right atrial pressure of 15 mmHg, the estimated right ventricular systolic pressure is 102.6 mmHg. Left Atrium: Left atrial size was severely dilated. Right Atrium: Right atrial size was severely dilated. Pericardium: Trivial pericardial effusion is present. Mitral Valve: The mitral valve is normal in structure. Moderate to  severe mitral valve regurgitation. No evidence of mitral valve stenosis. Tricuspid Valve: The tricuspid valve is normal in structure. Tricuspid valve regurgitation is moderate . No evidence of tricuspid stenosis. Aortic Valve: The aortic valve is tricuspid. Aortic valve regurgitation is mild. No aortic stenosis is present. Pulmonic Valve: The pulmonic valve was normal in structure. Pulmonic valve regurgitation is mild. No evidence of pulmonic stenosis. Aorta: The aortic root is normal in size  and structure. Venous: The inferior vena cava is normal in size with greater than 50% respiratory variability, suggesting right atrial pressure of 3 mmHg. IAS/Shunts: No atrial level shunt detected by color flow Doppler. Additional Comments: Global hypokinesis with akinesis of the inferolateral wall; overall severe LV dysfunction. A device lead is visualized. There is a moderate pleural effusion in the left lateral region.  LEFT VENTRICLE PLAX 2D LVIDd:         7.20 cm      Diastology LVIDs:         6.70 cm      LV e' medial:    5.55 cm/s LV PW:         0.90 cm      LV E/e' medial:  15.1 LV IVS:        1.00 cm      LV e' lateral:   6.74 cm/s LVOT diam:     2.40 cm      LV E/e' lateral: 12.4 LV SV:         49 LV SV Index:   24 LVOT Area:     4.52 cm LV IVRT:       106 msec  LV Volumes (MOD) LV vol d, MOD A2C: 252.0 ml LV vol d, MOD A4C: 220.0 ml LV vol s, MOD A2C: 164.0 ml LV vol s, MOD A4C: 159.0 ml LV SV MOD A2C:     88.0 ml LV SV MOD A4C:     220.0 ml LV SV MOD BP:      77.1 ml RIGHT VENTRICLE             IVC RV Basal diam:  3.95 cm     IVC diam: 3.70 cm RV S prime:     10.60 cm/s TAPSE (M-mode): 1.9 cm LEFT ATRIUM              Index         RIGHT ATRIUM           Index LA diam:        6.70 cm  3.30 cm/m    RA Area:     37.70 cm LA Vol (A2C):   216.0 ml 106.38 ml/m  RA Volume:   143.00 ml 70.43 ml/m LA Vol (A4C):   248.0 ml 122.14 ml/m LA Biplane Vol: 236.0 ml 116.23 ml/m  AORTIC VALVE             PULMONIC VALVE LVOT  Vmax:   66.30 cm/s  PR End Diast Vel: 13.69 msec LVOT Vmean:  39.900 cm/s LVOT VTI:    0.109 m  AORTA Ao Root diam: 3.70 cm Ao Asc diam:  3.50 cm MITRAL VALVE                  TRICUSPID VALVE MV Area (PHT): 3.68 cm       TR Peak grad:   87.6 mmHg MV Decel Time: 206 msec       TR Vmax:        468.00 cm/s MR Peak grad:    54.5 mmHg MR Mean grad:    31.0 mmHg    SHUNTS MR Vmax:         369.00 cm/s  Systemic VTI:  0.11 m MR Vmean:        252.0 cm/s   Systemic Diam: 2.40 cm MR PISA:         2.26 cm MR PISA Eff ROA: 24 mm MR PISA  Radius:  0.60 cm MV E velocity: 83.90 cm/s MV A velocity: 55.50 cm/s MV E/A ratio:  1.51 Redell Shallow MD Electronically signed by Redell Shallow MD Signature Date/Time: 03/18/2024/3:55:56 PM    Final    US  RENAL Result Date: 03/18/2024 EXAM: US  Retroperitoneum Complete, Renal. 03/18/2024 01:47:21 AM TECHNIQUE: Real-time ultrasonography of the retroperitoneum renal was performed. COMPARISON: CT abdomen/pelvis dated 12/23/2023 CLINICAL HISTORY: Acute renal failure (ARF) FINDINGS: FINDINGS: RIGHT KIDNEY/URETER: The right kidney measures 10.4 x 5.3 x 5.4 cm with a volume of 157 mL. Normal cortical echogenicity. Mild hydronephrosis. No calculus. Simple 2.1 cm interpolar right renal cyst. LEFT KIDNEY/URETER: The left kidney measures 11.4 x 6.1 x 5.8 cm with a volume of 212 mL. Normal cortical echogenicity. Mild hydronephrosis. No calculus. Simple 3.2 cm interpolar left renal cyst. BLADDER: Post-void residual volume is 649 mL. Prostatomegaly, suggesting BPH. Unremarkable appearance of the bladder. IMPRESSION: 1. Mild bilateral hydronephrosis, likely secondary to bladder outlet obstruction. 2. Post-void bladder residual of 649 mL. 3. Prostatomegaly, suggesting benign prostatic hyperplasia. Electronically signed by: Pinkie Pebbles MD 03/18/2024 01:53 AM EST RP Workstation: [id number]   DG Chest 2 View Result Date: 03/17/2024 CLINICAL DATA:  Shortness of breath EXAM: CHEST - 2 VIEW  COMPARISON:  01/27/2018 FINDINGS: Left-sided multi lead cardiac pacing device. Cardiomegaly with central congestion. Small bilateral effusions. Aortic atherosclerosis IMPRESSION: Cardiomegaly with central congestion and small bilateral effusions. Electronically Signed   By: Luke Bun M.D.   On: 03/17/2024 18:58      Patient Profile   83 year old male with past medical history of nonischemic cardiomyopathy, paroxysmal atrial fibrillation/flutter, mitral regurgitation, prior ICD, chronic stage III kidney disease for evaluation of acute on chronic combined systolic/diastolic congestive heart failure.  Echocardiogram this admission shows ejection fraction 25 to 30% with akinesis of the inferolateral wall, severe left ventricular enlargement, grade 2 diastolic dysfunction, mild RV dysfunction, severe pulmonary hypertension, severe biatrial enlargement, pleural effusion, moderate to severe mitral regurgitation, moderate tricuspid regurgitation, mild aortic insufficiency.  Assessment & Plan  1 acute on chronic combined systolic/diastolic congestive heart failure-some improvement this morning.  Continue Lasix  at present dose; Jardiance  also added.  Follow renal function.  Will not add spironolactone  in the setting of significant renal insufficiency.     2 nonischemic cardiomyopathy-LV function remains severely reduced with moderate to severe mitral regurgitation which is unchanged.  Continue bisoprolol .  He is not on ARB or Entresto  due to severity of renal insufficiency.  Continue hydralazine /nitrates.   3 mitral regurgitation murmur-moderate to severe on follow-up echocardiogram and unchanged compared to previous.   4 chronic stage III kidney disease-follow renal function closely with diuresis.   5 paroxysmal atrial fibrillation-continue amiodarone  and Coumadin  with goal INR 2-3.  Patient is in AV paced rhythm at present.   6 history of ICD  7 Elevated TSH-per primary service.   For questions or  updates, please contact [facility] Please consult [email or web address] for contact info under     Signed, Redell Shallow, MD  03/19/2024, 9:04 AM

## 2024-03-19 NOTE — Progress Notes (Signed)
°  Progress Note   Patient: Carlos Gibson FMW:[id number] DOB: Mar 23, 1941 DOA: 03/17/2024     2 DOS: the patient was seen and examined on 03/19/2024   Brief hospital course: 83 y.o. male with medical history significant of chronic systolic CHF attributed to be secondary to nonischemic cardiomyopathy, history of trifascicular block status AICD, persistent A-fib/flutter on anticoagulation, chronic kidney disease stage IIIb, diabetes mellitus, pulmonary hypertension, hypertension, dyslipidemia.  He follows up with Dr. Inocencio.  He has been in his usual state of health until over the past couple of weeks, he has noted worsening exertional dyspnea.  This has progressed to shortness of breath at rest.  He admitted to recent orthopnea and paroxysmal nocturnal dyspnea.  He had sit up in his recliner to sleep last night which is unusual from his baseline.  He admits to being compliant with his medications including doubling up on his furosemide  when symptoms worsen.  He admitted to eating some salt over the Thanksgiving holidays.   Assessment and Plan: * Acute on chronic systolic CHF (congestive heart failure) (HCC) Echocardiogram with reduced LV systolic function with EF 25 to 30%, global hypokinesis, with akinesis of the inferolateral wall. Severe cavity dilatation, grade II diastolic dysfunction with pseudo normalization EA, interventricular flattened in systole and diastole, RV systolic function with mild reduction, RVSP 102.6 mmHg, tricuspid regurgitant velocity 4,68 m/s, severe dilatation LA and RA, moderate to severe mitral valve regurgitation, moderate tricuspid valve regurgitation, trivial pericardial effusion,   Urine output is 3,020 ml Systolic blood pressure 100 mmHg.   Plan to continue diuresis with furosemide  40 mg IV bid Bisoprolol , hydralazine  and isosorbide .  SGLT 2 inh  Limited medical therapy due to acutely reduced GFR and risk of hypotension.   Essential hypertension Continue blood  pressure monitoring Continue bisoprolol .   Atrial flutter (HCC) Continue rate control with bisoprolol  and amiodarone   Anticoagulation with warfarin   Chronic kidney disease, stage 3b (HCC) AKI  Renal function today with serum cr at 2.15 with K at 3,5 and serum bicarbonate at 24 Na 139   Continue diuresis with furosemide  and SGLT 2 inh Follow up renal function and electrolytes in am.   Type 2 diabetes mellitus with hyperlipidemia (HCC) Continue glucose cover and monitoring with insulin  sliding scale.  Continue statin         Subjective: Patient is feeling better, dyspnea has improved, no chest pain, tolerating po well.   Physical Exam: Vitals:   03/19/24 0400 03/19/24 0500 03/19/24 0835 03/19/24 1203  BP: 111/68  (!) 108/54 105/60  Pulse: 71  69 68  Resp: 19  16 18   Temp: 97.8 F (36.6 C)  98 F (36.7 C) 97.8 F (36.6 C)  TempSrc: Oral  Oral Oral  SpO2: 99%  100% 100%  Weight:  74.3 kg    Height:       Neurology awake and alert ENT with mild pallor with no icterus Cardiovascular with S1 and S2 present and regular, positive systolic murmur at the left lower sternal border,and apex no gallops.,  Respiratory with mild rale with no wheezing or rhonchi  Abdomen with no distention, soft and non tender No lower extremity edema   Data Reviewed:    Family Communication: no family at the bedside   Disposition: Status is: Inpatient Remains inpatient appropriate because: recovering heart failure   Planned Discharge Destination: Home     Author: Elidia Toribio Furnace, MD 03/19/2024 2:14 PM  For on call review [email or web address].

## 2024-03-19 NOTE — Hospital Course (Signed)
 Carlos Gibson was admitted to the hospital with the working diagnosis of heart failure decompensation.   84 y.o. male with heart failure, sp AICD implantation, atrial fibrillation, chronic kidney disease stage IIIb, diabetes mellitus, pulmonary hypertension, hypertension, dyslipidemia who presented with dyspnea. Reported progressive worsening dyspnea for 2 weeks prior to admission, consistent with dyspnea, orthopnea and lower extremity edema. Positive dietary indiscretion over the holidays.  On his initial physical examination his blood pressure was 115/67, HR 69, RR 17 and 02 saturation 98% Lungs with no wheezing, or rhonchi, decreased breath sounds bilaterally, heart with S1 and S2 present and regular with no gallops or rubs, abdomen with no distention, no lower extremity edema, positive scrotal edema.   Na 139, K 3.5 Cl 106 bicarbonate 24 glucose 228, bun 30 cr 2,53  BNP 3,081  Wbc 3.7 hgb 11.7 plt 106   Chest radiograph hypoinflation, positive cardiomegaly, bilateral hilar vascular congestion with prominent pulmonary artery at the right side, small bilateral pleural effusions, pacemaker in place with one right atrial, one right ventricle and left ventricle leads.   EKG 74 bpm, right axis deviation, qtc 557, left bundle branch block, atrial and ventricular pacing with PVC, with no significant ST segment or T wave changes.   Patient was placed on furosemide  IV for diuresis.   12/15 volume status is improving. Pending renal function to be more stable.

## 2024-03-19 NOTE — Assessment & Plan Note (Addendum)
 Continue rate control with bisoprolol  and amiodarone   Anticoagulation with warfarin  INR today 3.1

## 2024-03-19 NOTE — Assessment & Plan Note (Signed)
 AKI  Today renal function with serum cr at 2.66 with K at 3,7 and serum bicarbonate at 30  Na 140 and Mg 1.9   Plan to add 20 kcl and 1 g mg sulfate, to keep K around 4 and Mg around 2  Continue with  SGLT 2 inh Follow up renal function and electrolytes in am.

## 2024-03-19 NOTE — Assessment & Plan Note (Signed)
 Continue blood pressure monitoring Continue bisoprolol .

## 2024-03-19 NOTE — Assessment & Plan Note (Signed)
 Echocardiogram with reduced LV systolic function with EF 25 to 30%, global hypokinesis, with akinesis of the inferolateral wall. Severe cavity dilatation, grade II diastolic dysfunction with pseudo normalization EA, interventricular flattened in systole and diastole, RV systolic function with mild reduction, RVSP 102.6 mmHg, tricuspid regurgitant velocity 4,68 m/s, severe dilatation LA and RA, moderate to severe mitral valve regurgitation, moderate tricuspid valve regurgitation, trivial pericardial effusion,   Urine output is 3,810 ml Systolic blood pressure 100 mmHg.   Continue with Bisoprolol , hydralazine  and isosorbide .  SGLT 2 inh  Holding loop diuretic today  Limited medical therapy due to acutely reduced GFR and risk of hypotension.

## 2024-03-19 NOTE — Progress Notes (Signed)
 ANTICOAGULATION CONSULT NOTE  Pharmacy Consult for Warfarin Indication: atrial flutter  Allergies[1]  Patient Measurements: Height: 5' 11 (180.3 cm) Weight: 74.3 kg (163 lb 12.8 oz) IBW/kg (Calculated) : 75.3  Vital Signs: Temp: 97.8 F (36.6 C) (12/14 0400) Temp Source: Oral (12/14 0400) BP: 111/68 (12/14 0400) Pulse Rate: 71 (12/14 0400)  Labs: Recent Labs    03/17/24 1708 03/17/24 2138 03/18/24 0028 03/18/24 0356 03/19/24 0232  HGB 11.7*  --  11.6*  --  11.1*  HCT 35.9*  --  35.3*  --  34.0*  PLT 106*  --  102*  --  103*  LABPROT  --  32.5* 30.8*  --  36.5*  INR  --  3.0* 2.8*  --  3.5*  CREATININE 2.53*  --   --  2.29* 2.15*    Estimated Creatinine Clearance: 27.4 mL/min (A) (by C-G formula based on SCr of 2.15 mg/dL (H)).   Medical History: Past Medical History:  Diagnosis Date   Aortic regurgitation    a. mild AI by echo 01/2016.   Aortic root dilation    Aortic valve regurgitation    Bifascicular block    Chronic systolic CHF (congestive heart failure) (HCC)    CKD (chronic kidney disease), stage III (HCC) 01/01/2016   Diabetes (HCC)    Ejection fraction < 50%    Frequent PVCs    Gilbert's syndrome    Hyperlipidemia    Mild diastolic dysfunction    Mitral regurgitation    a. mod by echo 10/207.   NICM (nonischemic cardiomyopathy) (HCC)    Pericardial effusion    a. small by echo 01/2016.   Pulmonary hypertension (HCC)    Right BBB/left post fasc block    Thrombocytopenia     Medications:  Medications Prior to Admission  Medication Sig Dispense Refill Last Dose/Taking   alfuzosin  (UROXATRAL ) 10 MG 24 hr tablet Take 10 mg by mouth daily.   03/17/2024 at  8:00 AM   amiodarone  (PACERONE ) 200 MG tablet Take 1 tablet (200 mg total) by mouth daily. 30 tablet 11 03/17/2024 at  8:00 AM   atorvastatin  (LIPITOR) 10 MG tablet Take 10 mg by mouth in the morning.   03/17/2024 at  8:00 AM   bisoprolol  (ZEBETA ) 5 MG tablet Take 1 tablet (5 mg total) by  mouth daily. 90 tablet 1 03/17/2024 at  8:00 AM   cholecalciferol  (VITAMIN D3) 25 MCG (1000 UNIT) tablet Take 1,000 Units by mouth daily. (Patient taking differently: Take 1,000 Units by mouth every other day.)   03/17/2024 at  8:00 AM   cyanocobalamin  (VITAMIN B12) 1000 MCG tablet Take 1,000 mcg by mouth daily. (Patient taking differently: Take 1,000 mcg by mouth every other day.)   03/17/2024 at  8:00 AM   finasteride  (PROSCAR ) 5 MG tablet Take 5 mg by mouth in the morning.   03/17/2024 at  8:00 AM   furosemide  (LASIX ) 40 MG tablet Take 1 tablet (40 mg total) by mouth daily as needed for fluid. Must have office visit with Dr Jeffrie for further refills or obtain from PCP 90 tablet 0 03/17/2024   hydrALAZINE  (APRESOLINE ) 25 MG tablet Take 25 mg by mouth 2 (two) times daily.   03/17/2024 at  8:00 AM   isosorbide  mononitrate (IMDUR ) 30 MG 24 hr tablet Take 30 mg by mouth daily.   03/17/2024 at  8:00 AM   warfarin (COUMADIN ) 5 MG tablet TAKE 1/2 TO 1 TABLET BY MOUTH ONCE DAILY AS DIRECTED by anticoagulation  clinic (Patient taking differently: Take 2.5-5 mg by mouth at bedtime. 5 mg (5 mg x 1) every Tue, Thu, Sat; 2.5 mg (5 mg x 0.5) all other days) 30 tablet 5 03/16/2024   Scheduled:   alfuzosin   10 mg Oral Daily   amiodarone   200 mg Oral Daily   atorvastatin   10 mg Oral QHS   bisoprolol   5 mg Oral Daily   cyanocobalamin   1,000 mcg Oral Daily   finasteride   5 mg Oral Daily   furosemide   40 mg Intravenous Q12H   hydrALAZINE   25 mg Oral BID   insulin  aspart  0-9 Units Subcutaneous TID WC   isosorbide  mononitrate  30 mg Oral Daily   Warfarin - Pharmacist Dosing Inpatient   Does not apply q1600   Infusions:    PRN: acetaminophen , ondansetron  (ZOFRAN ) IV  Assessment: 76 yom with a history of AF on warfarin, HF, ICD, HTN. Patient is presenting with SOB. Warfarin per pharmacy consult placed for atrial flutter.  Patient taking warfarin prior to arrival. Home dose is 5 mg (5 mg x 1) every Tue, Thu,  Sat; 2.5 mg (5 mg x 0.5) all other days. Last taken 12/11 evening per patient.  12/14 - INR today is supratherapeutic at 3.5. CBC is stable. No signs of bleeding per RN. Will hold today's dose with plans to resume tomorrow.  Goal of Therapy:  INR Goal 2-3 Monitor platelets by anticoagulation protocol: Yes   Plan:  Hold warfarin today  Daily INR, s/s bleeding  B. Amon Rocher, PharmD PGY-1 Pharmacy Resident [facility] 03/19/2024 8:20 AM           [1]  Allergies Allergen Reactions   Ace Inhibitors Cough   Coreg  [Carvedilol ] Other (See Comments)    feels bad   Metformin  Hcl Diarrhea   Sulfa Antibiotics Other (See Comments)    Patient cannot recall symptoms   Toprol  Xl [Metoprolol  Tartrate] Other (See Comments)    feels bad

## 2024-03-19 NOTE — Assessment & Plan Note (Signed)
 Patient was placed on insulin  sliding scale for glucose cover and monitoring, during his hospitalization  At the time of discharge he will resume his diabetic regimen.  Continue statin

## 2024-03-20 ENCOUNTER — Encounter (HOSPITAL_COMMUNITY): Payer: Self-pay | Admitting: Internal Medicine

## 2024-03-20 ENCOUNTER — Other Ambulatory Visit (HOSPITAL_COMMUNITY): Payer: Self-pay

## 2024-03-20 LAB — CBC
HCT: 36.1 % — ABNORMAL LOW (ref 39.0–52.0)
Hemoglobin: 11.9 g/dL — ABNORMAL LOW (ref 13.0–17.0)
MCH: 29.3 pg (ref 26.0–34.0)
MCHC: 33 g/dL (ref 30.0–36.0)
MCV: 88.9 fL (ref 80.0–100.0)
Platelets: 116 K/uL — ABNORMAL LOW (ref 150–400)
RBC: 4.06 MIL/uL — ABNORMAL LOW (ref 4.22–5.81)
RDW: 14.4 % (ref 11.5–15.5)
WBC: 4.8 K/uL (ref 4.0–10.5)
nRBC: 0 % (ref 0.0–0.2)

## 2024-03-20 LAB — BASIC METABOLIC PANEL WITH GFR
Anion gap: 7 (ref 5–15)
BUN: 28 mg/dL — ABNORMAL HIGH (ref 8–23)
CO2: 30 mmol/L (ref 22–32)
Calcium: 8.6 mg/dL — ABNORMAL LOW (ref 8.9–10.3)
Chloride: 103 mmol/L (ref 98–111)
Creatinine, Ser: 2.66 mg/dL — ABNORMAL HIGH (ref 0.61–1.24)
GFR, Estimated: 23 mL/min — ABNORMAL LOW (ref >60)
Glucose, Bld: 127 mg/dL — ABNORMAL HIGH (ref 70–99)
Potassium: 3.7 mmol/L (ref 3.5–5.1)
Sodium: 140 mmol/L (ref 135–145)

## 2024-03-20 LAB — GLUCOSE, CAPILLARY
Glucose-Capillary: 129 mg/dL — ABNORMAL HIGH (ref 70–99)
Glucose-Capillary: 174 mg/dL — ABNORMAL HIGH (ref 70–99)
Glucose-Capillary: 120 mg/dL — ABNORMAL HIGH (ref 70–99)
Glucose-Capillary: 134 mg/dL — ABNORMAL HIGH (ref 70–99)

## 2024-03-20 LAB — PROTIME-INR
INR: 3.1 — ABNORMAL HIGH (ref 0.8–1.2)
Prothrombin Time: 33 s — ABNORMAL HIGH (ref 11.4–15.2)

## 2024-03-20 LAB — MAGNESIUM: Magnesium: 1.9 mg/dL (ref 1.7–2.4)

## 2024-03-20 MED ORDER — WARFARIN SODIUM 2.5 MG PO TABS
2.5000 mg | ORAL_TABLET | Freq: Once | ORAL | Status: AC
  Administered 2024-03-20: 17:00:00 2.5 mg via ORAL
  Filled 2024-03-20: qty 1

## 2024-03-20 MED ORDER — MAGNESIUM SULFATE IN D5W 1-5 GM/100ML-% IV SOLN
1.0000 g | Freq: Once | INTRAVENOUS | Status: AC
  Administered 2024-03-20: 14:00:00 1 g via INTRAVENOUS
  Filled 2024-03-20: qty 100

## 2024-03-20 MED ORDER — POTASSIUM CHLORIDE CRYS ER 20 MEQ PO TBCR
20.0000 meq | EXTENDED_RELEASE_TABLET | Freq: Once | ORAL | Status: AC
  Administered 2024-03-20: 14:00:00 20 meq via ORAL
  Filled 2024-03-20: qty 1

## 2024-03-20 NOTE — Plan of Care (Signed)
°  Problem: Fluid Volume: Goal: Ability to maintain a balanced intake and output will improve Outcome: Progressing   Problem: Education: Goal: Knowledge of General Education information will improve Description: Including pain rating scale, medication(s)/side effects and non-pharmacologic comfort measures Outcome: Progressing   Problem: Clinical Measurements: Goal: Diagnostic test results will improve Outcome: Progressing   Problem: Nutrition: Goal: Adequate nutrition will be maintained Outcome: Progressing   Problem: Coping: Goal: Level of anxiety will decrease Outcome: Progressing   Problem: Safety: Goal: Ability to remain free from injury will improve Outcome: Progressing

## 2024-03-20 NOTE — Progress Notes (Signed)
° °  Heart Failure Stewardship Pharmacist Progress Note   PCP: Charlott Dorn LABOR, MD PCP-Cardiologist: Oneil Parchment, MD    HPI:  83 yo M with PMH of CHF, NICM, AICD, afib, CKD IIIb, T2DM, pulmonary HTN, HTN and HLD.   Presented to the ED on 12/12 with worsening shortness of breath, orthopnea, and PND. Reported compliance with his medications but did have dietary indiscretion over Thanksgiving. CXR with central congestion and small bilateral effusions. LVEF 25-30%, RWMA, G2DD, RV mildly reduced, severely elevated PA pressure. BNP elevated. Started on IV lasix  and cardiology consulted. Foley placed 12/13.  Denies shortness of breath. Able to lay flat. No LE edema. Able to lay flat. Reviewed GDMT and goals of therapy. He is wondering if he needs higher dose of lasix  at discharge.   Current HF Medications: Beta Blocker: bisoprolol  5 mg daily SGLT2i: Jardiance  10 mg daily Other: hydralazine  25 mg BID + Imdur  30 mg daily  Prior to admission HF Medications: Diuretic: furosemide  40 mg daily PRN Beta blocker: bisoprolol  5 mg daily Other: hydralazine  25 mg BID + Imdur  30 mg daily  Pertinent Lab Values: Serum creatinine 2.66, BUN 28, Potassium 3.7, Sodium 140, BNP 3081, Magnesium  1.9, A1c 7.5   Vital Signs: Weight: 163 lbs (admission weight: ~183 lbs) Blood pressure: 100-110/50s  Heart rate: 70s  I/O: net -2.8L yesterday; net -8L since admission  Medication Assistance / Insurance Benefits Check: Does the patient have prescription insurance?  Yes Type of insurance plan: [facility] Medicare  Outpatient Pharmacy:  Prior to admission outpatient pharmacy: [facility] Is the patient willing to use [facility] TOC pharmacy at discharge? Yes Is the patient willing to transition their outpatient pharmacy to utilize a [facility] outpatient pharmacy?   No    Assessment: 1. Acute on chronic systolic CHF (LVEF 25-30%), due to NICM. NYHA class III symptoms. - Holding lasix  with bump in creatinine  - Continue  bisoprolol  5 mg daily - Recommend holding Jardiance  while foley is in place - Continue hydralazine  25 mg BID + Imdur  30 mg daily    Plan: 1) Medication changes recommended at this time: - Hold Jardiance  with foley  2) Patient assistance: - States that copays are affordable for him - Jardiance  test claim pending  3)  Education  - Patient has been educated on current HF medications and potential additions to HF medication regimen - Patient verbalizes understanding that over the next few months, these medication doses may change and more medications may be added to optimize HF regimen - Patient has been educated on basic disease state pathophysiology and goals of therapy   Duwaine Plant, PharmD, BCPS Heart Failure Engineer, Building Services Phone [phone]

## 2024-03-20 NOTE — Progress Notes (Signed)
 ANTICOAGULATION CONSULT NOTE  Pharmacy Consult for Warfarin Indication: atrial flutter  Allergies[1]  Patient Measurements: Height: 5' 11 (180.3 cm) Weight: 74.1 kg (163 lb 5.8 oz) IBW/kg (Calculated) : 75.3  Vital Signs: Temp: 97.9 F (36.6 C) (12/15 0803) Temp Source: Oral (12/15 0803) BP: 109/61 (12/15 0803) Pulse Rate: 106 (12/15 0803)  Labs: Recent Labs    03/18/24 0028 03/18/24 0356 03/19/24 0232 03/20/24 0238  HGB 11.6*  --  11.1* 11.9*  HCT 35.3*  --  34.0* 36.1*  PLT 102*  --  103* 116*  LABPROT 30.8*  --  36.5* 33.0*  INR 2.8*  --  3.5* 3.1*  CREATININE  --  2.29* 2.15* 2.66*    Estimated Creatinine Clearance: 22.1 mL/min (A) (by C-G formula based on SCr of 2.66 mg/dL (H)).   Medical History: Past Medical History:  Diagnosis Date   Aortic regurgitation    a. mild AI by echo 01/2016.   Aortic root dilation    Aortic valve regurgitation    Bifascicular block    Chronic systolic CHF (congestive heart failure) (HCC)    CKD (chronic kidney disease), stage III (HCC) 01/01/2016   Diabetes (HCC)    Ejection fraction < 50%    Frequent PVCs    Gilbert's syndrome    Hyperlipidemia    Mild diastolic dysfunction    Mitral regurgitation    a. mod by echo 10/207.   NICM (nonischemic cardiomyopathy) (HCC)    Pericardial effusion    a. small by echo 01/2016.   Pulmonary hypertension (HCC)    Right BBB/left post fasc block    Thrombocytopenia     Medications:  Medications Prior to Admission  Medication Sig Dispense Refill Last Dose/Taking   alfuzosin  (UROXATRAL ) 10 MG 24 hr tablet Take 10 mg by mouth daily.   03/17/2024 at  8:00 AM   amiodarone  (PACERONE ) 200 MG tablet Take 1 tablet (200 mg total) by mouth daily. 30 tablet 11 03/17/2024 at  8:00 AM   atorvastatin  (LIPITOR) 10 MG tablet Take 10 mg by mouth in the morning.   03/17/2024 at  8:00 AM   bisoprolol  (ZEBETA ) 5 MG tablet Take 1 tablet (5 mg total) by mouth daily. 90 tablet 1 03/17/2024 at  8:00 AM    cholecalciferol  (VITAMIN D3) 25 MCG (1000 UNIT) tablet Take 1,000 Units by mouth daily. (Patient taking differently: Take 1,000 Units by mouth every other day.)   03/17/2024 at  8:00 AM   cyanocobalamin  (VITAMIN B12) 1000 MCG tablet Take 1,000 mcg by mouth daily. (Patient taking differently: Take 1,000 mcg by mouth every other day.)   03/17/2024 at  8:00 AM   finasteride  (PROSCAR ) 5 MG tablet Take 5 mg by mouth in the morning.   03/17/2024 at  8:00 AM   furosemide  (LASIX ) 40 MG tablet Take 1 tablet (40 mg total) by mouth daily as needed for fluid. Must have office visit with Dr Jeffrie for further refills or obtain from PCP 90 tablet 0 03/17/2024   hydrALAZINE  (APRESOLINE ) 25 MG tablet Take 25 mg by mouth 2 (two) times daily.   03/17/2024 at  8:00 AM   isosorbide  mononitrate (IMDUR ) 30 MG 24 hr tablet Take 30 mg by mouth daily.   03/17/2024 at  8:00 AM   warfarin (COUMADIN ) 5 MG tablet TAKE 1/2 TO 1 TABLET BY MOUTH ONCE DAILY AS DIRECTED by anticoagulation clinic (Patient taking differently: Take 2.5-5 mg by mouth at bedtime. 5 mg (5 mg x 1) every Tue, Thu,  Sat; 2.5 mg (5 mg x 0.5) all other days) 30 tablet 5 03/16/2024   Scheduled:   alfuzosin   10 mg Oral Daily   amiodarone   200 mg Oral Daily   atorvastatin   10 mg Oral QHS   bisoprolol   5 mg Oral Daily   Chlorhexidine  Gluconate Cloth  6 each Topical Daily   cyanocobalamin   1,000 mcg Oral Daily   empagliflozin   10 mg Oral Daily   finasteride   5 mg Oral Daily   furosemide   40 mg Intravenous Q12H   hydrALAZINE   25 mg Oral BID   insulin  aspart  0-9 Units Subcutaneous TID WC   isosorbide  mononitrate  30 mg Oral Daily   Warfarin - Pharmacist Dosing Inpatient   Does not apply q1600   Infusions:    PRN: acetaminophen , ondansetron  (ZOFRAN ) IV  Assessment: 49 yom with a history of AF on warfarin, HF, ICD, HTN. Patient is presenting with SOB. Warfarin per pharmacy consult placed for atrial flutter.  Patient taking warfarin prior to arrival.  Home dose is 5 mg (5 mg x 1) every Tue, Thu, Sat; 2.5 mg (5 mg x 0.5) all other days. Last taken 12/11 evening per patient.  12/15 - INR today is just slightly supratherapeutic at 3.1 and is trending down. CBC is stable (Hgb 11.9, plt 116). No signs of bleeding noted.   Goal of Therapy:  INR Goal 2-3 Monitor platelets by anticoagulation protocol: Yes   Plan:  Warfarin 2.5 mg today Daily INR, s/s bleeding  B. Amon Rocher, PharmD PGY-1 Pharmacy Resident [facility] 03/20/2024 9:19 AM            [1]  Allergies Allergen Reactions   Ace Inhibitors Cough   Coreg  [Carvedilol ] Other (See Comments)    feels bad   Metformin  Hcl Diarrhea   Sulfa Antibiotics Other (See Comments)    Patient cannot recall symptoms   Toprol  Xl [Metoprolol  Tartrate] Other (See Comments)    feels bad

## 2024-03-20 NOTE — Progress Notes (Signed)
 Rounding Note   Patient Name: Carlos Gibson Date of Encounter: 03/20/2024   [facility] Cardiologist: Oneil Parchment, MD   Subjective Pt denies CP or dyspnea  Scheduled Meds:  alfuzosin   10 mg Oral Daily   amiodarone   200 mg Oral Daily   atorvastatin   10 mg Oral QHS   bisoprolol   5 mg Oral Daily   Chlorhexidine  Gluconate Cloth  6 each Topical Daily   cyanocobalamin   1,000 mcg Oral Daily   empagliflozin   10 mg Oral Daily   finasteride   5 mg Oral Daily   furosemide   40 mg Intravenous Q12H   hydrALAZINE   25 mg Oral BID   insulin  aspart  0-9 Units Subcutaneous TID WC   isosorbide  mononitrate  30 mg Oral Daily   warfarin  2.5 mg Oral ONCE-1600   Warfarin - Pharmacist Dosing Inpatient   Does not apply q1600   Continuous Infusions:  PRN Meds: acetaminophen , ondansetron  (ZOFRAN ) IV   Vital Signs  Vitals:   03/19/24 2300 03/20/24 0300 03/20/24 0803 03/20/24 1001  BP: 109/62 (!) 116/59 109/61 (!) 110/55  Pulse: 69 70 (!) 106 70  Resp: 20 (!) 23 20 18   Temp: 98 F (36.7 C) (!) 97.5 F (36.4 C) 97.9 F (36.6 C)   TempSrc: Oral Oral Oral   SpO2: 96% 98% 98% 100%  Weight:  74.1 kg    Height:  5' 11 (1.803 m)      Intake/Output Summary (Last 24 hours) at 03/20/2024 1024 Last data filed at 03/20/2024 1017 Gross per 24 hour  Intake 960 ml  Output 4360 ml  Net -3400 ml      03/20/2024    3:00 AM 03/19/2024    5:00 AM 03/17/2024    5:06 PM  Last 3 Weights  Weight (lbs) 163 lb 5.8 oz 163 lb 12.8 oz 183 lb  Weight (kg) 74.1 kg 74.3 kg 83.008 kg      Telemetry AV pacing - Personally Reviewed   Physical Exam  GEN: NAD Neck: supple Cardiac: RRR, 3/6 systolic murmur apex Respiratory: CTA GI: Soft, NT/ND MS: No edema. Neuro:  Grossly intact Psych: Normal affect   Labs     Chemistry Recent Labs  Lab 03/18/24 0356 03/19/24 0232 03/20/24 0238  NA 139 139 140  K 3.6 3.5 3.7  CL 105 105 103  CO2 27 24 30   GLUCOSE 177* 151* 127*  BUN 28* 29* 28*   CREATININE 2.29* 2.15* 2.66*  CALCIUM  8.7* 8.9 8.6*  MG  --   --  1.9  GFRNONAA 28* 30* 23*  ANIONGAP 7 10 7      Hematology Recent Labs  Lab 03/18/24 0028 03/19/24 0232 03/20/24 0238  WBC 4.3 4.7 4.8  RBC 3.96* 3.82* 4.06*  HGB 11.6* 11.1* 11.9*  HCT 35.3* 34.0* 36.1*  MCV 89.1 89.0 88.9  MCH 29.3 29.1 29.3  MCHC 32.9 32.6 33.0  RDW 14.3 14.6 14.4  PLT 102* 103* 116*   Thyroid   Recent Labs  Lab 03/19/24 0232  TSH 10.661*    BNP Recent Labs  Lab 03/17/24 1708  BNP 3,081.4*    Radiology  ECHOCARDIOGRAM COMPLETE Result Date: 03/18/2024    ECHOCARDIOGRAM REPORT   Patient Name:   Carlos Gibson Date of Exam: 03/18/2024 Medical Rec #:  [id number]     Height:       71.0 in Accession #:    [id number]    Weight:       183.0 lb Date of Birth:  12-20-1940     BSA:          2.030 m Patient Age:    83 years      BP:           120/65 mmHg Patient Gender: M             HR:           69 bpm. Exam Location:  Inpatient Procedure: 2D Echo (Both Spectral and Color Flow Doppler were utilized during            procedure). Indications:    acute systolic chf  History:        Patient has prior history of Echocardiogram examinations, most                 recent 11/06/2021. Cardiomyopathy and CHF, Defibrillator and                 Pacemaker, Arrythmias:RBBB, PVC and Atrial Fibrillation; Risk                 Factors:Dyslipidemia and Hypertension.  Sonographer:    Tinnie Barefoot RDCS Referring Phys: [id number] MAUDE MARLA DART  Sonographer Comments: Suboptimal subcostal window. IMPRESSIONS  1. Global hypokinesis with akinesis of the inferolateral wall; overall severe LV dysfunction.  2. Left ventricular ejection fraction, by estimation, is 25 to 30%. The left ventricle has severely decreased function. The left ventricle demonstrates regional wall motion abnormalities (see scoring diagram/findings for description). The left ventricular internal cavity size was severely dilated. Left ventricular diastolic  parameters are consistent with Grade II diastolic dysfunction (pseudonormalization). There is the interventricular septum is flattened in systole and diastole, consistent with right ventricular pressure and volume overload.  3. Right ventricular systolic function is mildly reduced. The right ventricular size is normal. There is severely elevated pulmonary artery systolic pressure.  4. Left atrial size was severely dilated.  5. Right atrial size was severely dilated.  6. Moderate pleural effusion in the left lateral region.  7. The mitral valve is normal in structure. Moderate to severe mitral valve regurgitation. No evidence of mitral stenosis.  8. Tricuspid valve regurgitation is moderate.  9. The aortic valve is tricuspid. Aortic valve regurgitation is mild. No aortic stenosis is present. 10. The inferior vena cava is normal in size with greater than 50% respiratory variability, suggesting right atrial pressure of 3 mmHg. FINDINGS  Left Ventricle: Left ventricular ejection fraction, by estimation, is 25 to 30%. The left ventricle has severely decreased function. The left ventricle demonstrates regional wall motion abnormalities. The left ventricular internal cavity size was severely dilated. There is no left ventricular hypertrophy. The interventricular septum is flattened in systole and diastole, consistent with right ventricular pressure and volume overload. Left ventricular diastolic parameters are consistent with Grade II diastolic dysfunction (pseudonormalization). Right Ventricle: The right ventricular size is normal. Right ventricular systolic function is mildly reduced. There is severely elevated pulmonary artery systolic pressure. The tricuspid regurgitant velocity is 4.68 m/s, and with an assumed right atrial pressure of 15 mmHg, the estimated right ventricular systolic pressure is 102.6 mmHg. Left Atrium: Left atrial size was severely dilated. Right Atrium: Right atrial size was severely dilated.  Pericardium: Trivial pericardial effusion is present. Mitral Valve: The mitral valve is normal in structure. Moderate to severe mitral valve regurgitation. No evidence of mitral valve stenosis. Tricuspid Valve: The tricuspid valve is normal in structure. Tricuspid valve regurgitation is moderate . No evidence of tricuspid stenosis. Aortic Valve: The aortic  valve is tricuspid. Aortic valve regurgitation is mild. No aortic stenosis is present. Pulmonic Valve: The pulmonic valve was normal in structure. Pulmonic valve regurgitation is mild. No evidence of pulmonic stenosis. Aorta: The aortic root is normal in size and structure. Venous: The inferior vena cava is normal in size with greater than 50% respiratory variability, suggesting right atrial pressure of 3 mmHg. IAS/Shunts: No atrial level shunt detected by color flow Doppler. Additional Comments: Global hypokinesis with akinesis of the inferolateral wall; overall severe LV dysfunction. A device lead is visualized. There is a moderate pleural effusion in the left lateral region.  LEFT VENTRICLE PLAX 2D LVIDd:         7.20 cm      Diastology LVIDs:         6.70 cm      LV e' medial:    5.55 cm/s LV PW:         0.90 cm      LV E/e' medial:  15.1 LV IVS:        1.00 cm      LV e' lateral:   6.74 cm/s LVOT diam:     2.40 cm      LV E/e' lateral: 12.4 LV SV:         49 LV SV Index:   24 LVOT Area:     4.52 cm LV IVRT:       106 msec  LV Volumes (MOD) LV vol d, MOD A2C: 252.0 ml LV vol d, MOD A4C: 220.0 ml LV vol s, MOD A2C: 164.0 ml LV vol s, MOD A4C: 159.0 ml LV SV MOD A2C:     88.0 ml LV SV MOD A4C:     220.0 ml LV SV MOD BP:      77.1 ml RIGHT VENTRICLE             IVC RV Basal diam:  3.95 cm     IVC diam: 3.70 cm RV S prime:     10.60 cm/s TAPSE (M-mode): 1.9 cm LEFT ATRIUM              Index         RIGHT ATRIUM           Index LA diam:        6.70 cm  3.30 cm/m    RA Area:     37.70 cm LA Vol (A2C):   216.0 ml 106.38 ml/m  RA Volume:   143.00 ml 70.43 ml/m LA  Vol (A4C):   248.0 ml 122.14 ml/m LA Biplane Vol: 236.0 ml 116.23 ml/m  AORTIC VALVE             PULMONIC VALVE LVOT Vmax:   66.30 cm/s  PR End Diast Vel: 13.69 msec LVOT Vmean:  39.900 cm/s LVOT VTI:    0.109 m  AORTA Ao Root diam: 3.70 cm Ao Asc diam:  3.50 cm MITRAL VALVE                  TRICUSPID VALVE MV Area (PHT): 3.68 cm       TR Peak grad:   87.6 mmHg MV Decel Time: 206 msec       TR Vmax:        468.00 cm/s MR Peak grad:    54.5 mmHg MR Mean grad:    31.0 mmHg    SHUNTS MR Vmax:         369.00 cm/s  Systemic VTI:  0.11 m MR Vmean:        252.0 cm/s   Systemic Diam: 2.40 cm MR PISA:         2.26 cm MR PISA Eff ROA: 24 mm MR PISA Radius:  0.60 cm MV E velocity: 83.90 cm/s MV A velocity: 55.50 cm/s MV E/A ratio:  1.51 Redell Shallow MD Electronically signed by Redell Shallow MD Signature Date/Time: 03/18/2024/3:55:56 PM    Final       Patient Profile   83 year old male with past medical history of nonischemic cardiomyopathy, paroxysmal atrial fibrillation/flutter, mitral regurgitation, prior ICD, chronic stage III kidney disease for evaluation of acute on chronic combined systolic/diastolic congestive heart failure.  Echocardiogram this admission shows ejection fraction 25 to 30% with akinesis of the inferolateral wall, severe left ventricular enlargement, grade 2 diastolic dysfunction, mild RV dysfunction, severe pulmonary hypertension, severe biatrial enlargement, pleural effusion, moderate to severe mitral regurgitation, moderate tricuspid regurgitation, mild aortic insufficiency.  Assessment & Plan  1 acute on chronic combined systolic/diastolic congestive heart failure-symptoms have improved.  However renal function is worse.  Will hold further Lasix  but continue Jardiance .  We have not added spironolactone  in the setting of significant renal insufficiency.     2 nonischemic cardiomyopathy-LV function remains severely reduced with moderate to severe mitral regurgitation which is  unchanged.  Continue bisoprolol .  He is not on ARB or Entresto  due to severity of renal insufficiency.  Continue hydralazine /nitrates.   3 mitral regurgitation murmur-moderate to severe on follow-up echocardiogram and unchanged compared to previous.   4 chronic stage III kidney disease-renal function worse today.  Will hold Lasix  and recheck in the morning.   5 paroxysmal atrial fibrillation-continue amiodarone  and Coumadin  with goal INR 2-3.  Patient is in AV paced rhythm at present.   6 history of ICD  7 Elevated TSH-per primary service.   For questions or updates, please contact [facility] Please consult [email or web address] for contact info under     Signed, Redell Shallow, MD  03/20/2024, 10:24 AM

## 2024-03-20 NOTE — Evaluation (Signed)
 Physical Therapy Evaluation Patient Details Name: Carlos Gibson MRN: [id number] DOB: [birth year] Today's Date: 03/20/2024  History of Present Illness  Pt is an 83 y.o. male presenting 03/17/2024 with worsening exertional dyspnea. Found to have acute on chronic systolic CHF exacerbation. ECHO shows 25-30% EF. PMH: CHF, AICD placement, afib, CKD IIIb, DM, pulmonary HTN, dyslipidemia.  Clinical Impression  Pt presents to PT mobilizing well, pt is able to ambulate independently for household distances. Pt denies DOE or SOB when mobilizing, SpO2 in low 90s. Pt is encouraged to mobilize frequently in an effort to maintain his current level of function and to continue improving endurance. Pt has no further acute PT needs at this time. PT signing off.        If plan is discharge home, recommend the following:     Can travel by private vehicle        Equipment Recommendations None recommended by PT  Recommendations for Other Services       Functional Status Assessment Patient has not had a recent decline in their functional status     Precautions / Restrictions Precautions Precautions: None Recall of Precautions/Restrictions: Intact Restrictions Weight Bearing Restrictions Per Provider Order: No      Mobility  Bed Mobility Overal bed mobility: Independent                  Transfers Overall transfer level: Independent Equipment used: None                    Ambulation/Gait Ambulation/Gait assistance: Modified independent (Device/Increase time) Gait Distance (Feet): 300 Feet Assistive device: None Gait Pattern/deviations: WFL(Within Functional Limits) Gait velocity: functional Gait velocity interpretation: >2.62 ft/sec, indicative of community ambulatory   General Gait Details: steady step-through gait  Stairs            Wheelchair Mobility     Tilt Bed    Modified Rankin (Stroke Patients Only)       Balance Overall balance assessment:  Independent                                           Pertinent Vitals/Pain Pain Assessment Pain Assessment: No/denies pain    Home Living Family/patient expects to be discharged to:: Private residence Living Arrangements: Alone Available Help at Discharge: Family;Available PRN/intermittently Type of Home: House Home Access: Stairs to enter Entrance Stairs-Rails: None Entrance Stairs-Number of Steps: 5 Alternate Level Stairs-Number of Steps: 6 Home Layout: Multi-level Home Equipment: None      Prior Function Prior Level of Function : Independent/Modified Independent                     Extremity/Trunk Assessment   Upper Extremity Assessment Upper Extremity Assessment: Overall WFL for tasks assessed    Lower Extremity Assessment Lower Extremity Assessment: Overall WFL for tasks assessed    Cervical / Trunk Assessment Cervical / Trunk Assessment: Normal  Communication   Communication Communication: No apparent difficulties    Cognition Arousal: Alert Behavior During Therapy: WFL for tasks assessed/performed   PT - Cognitive impairments: No apparent impairments                         Following commands: Intact       Cueing Cueing Techniques: Verbal cues     General Comments General comments (skin integrity, edema,  etc.): SpO2 93% when ambulating on room air, pt denies DOE. PT notes red colored urine in foley, RN made aware    Exercises     Assessment/Plan    PT Assessment Patient does not need any further PT services  PT Problem List         PT Treatment Interventions      PT Goals (Current goals can be found in the Care Plan section)       Frequency       Co-evaluation               AM-PAC PT 6 Clicks Mobility  Outcome Measure Help needed turning from your back to your side while in a flat bed without using bedrails?: None Help needed moving from lying on your back to sitting on the side of a  flat bed without using bedrails?: None Help needed moving to and from a bed to a chair (including a wheelchair)?: None Help needed standing up from a chair using your arms (e.g., wheelchair or bedside chair)?: None Help needed to walk in hospital room?: None Help needed climbing 3-5 steps with a railing? : None 6 Click Score: 24    End of Session   Activity Tolerance: Patient tolerated treatment well Patient left: in bed;with call bell/phone within reach Nurse Communication: Mobility status PT Visit Diagnosis: Other abnormalities of gait and mobility (R26.89)    Time: 1737-1750 PT Time Calculation (min) (ACUTE ONLY): 13 min   Charges:   PT Evaluation $PT Eval Low Complexity: 1 Low   PT General Charges $$ ACUTE PT VISIT: 1 Visit         Bernardino JINNY Ruth, PT, DPT Acute Rehabilitation Office [phone]   Bernardino JINNY Ruth 03/20/2024, 5:57 PM

## 2024-03-20 NOTE — Progress Notes (Signed)
 Chaplain visited Pt, Pt declined Chaplain visit.

## 2024-03-20 NOTE — Progress Notes (Signed)
 Heart Failure Nurse Navigator Progress Note  PCP: Charlott Dorn LABOR, MD PCP-Cardiologist: Jeffrie Admission Diagnosis: Acute on chronic congestive heart failure.  Admitted from: Home  Presentation:   Carlos Gibson presented with shortness of breath x 2 weeks, BLE edema +1 , reports he has been compliant with his lasix . He admitted to eating some salt over the Thanksgiving holidays. BP 151/116, HR 70, BNP 3,081. Chest x-ray shows central congestion and bilateral pleural effusions. ECG shows AV pacing.   Patient was educated on the sign and symptoms of heart failure, daily weights, when to call his doctor or go to the ED. Diet/ fluid restrictions, taking all medications as prescribed and attending all medical appointments. Patient verbalized his understanding of the education. A HF TOC appointment was scheduled for 03/24/2024 @ 3:30 pm.   ECHO/ LVEF: 25-30%   Clinical Course:  Past Medical History:  Diagnosis Date   Aortic regurgitation    a. mild AI by echo 01/2016.   Aortic root dilation    Aortic valve regurgitation    Bifascicular block    Chronic systolic CHF (congestive heart failure) (HCC)    CKD (chronic kidney disease), stage III (HCC) 01/01/2016   Diabetes (HCC)    Ejection fraction < 50%    Frequent PVCs    Gilbert's syndrome    Hyperlipidemia    Mild diastolic dysfunction    Mitral regurgitation    a. mod by echo 10/207.   NICM (nonischemic cardiomyopathy) (HCC)    Pericardial effusion    a. small by echo 01/2016.   Pulmonary hypertension (HCC)    Right BBB/left post fasc block    Thrombocytopenia      Social History   Socioeconomic History   Marital status: Married    Spouse name: Not on file   Number of children: Not on file   Years of education: Not on file   Highest education level: Not on file  Occupational History   Not on file  Tobacco Use   Smoking status: Never    Passive exposure: Past   Smokeless tobacco: Never   Tobacco comments:     Never smoked 05/17/23  Vaping Use   Vaping status: Never Used  Substance and Sexual Activity   Alcohol use: No    Alcohol/week: 0.0 standard drinks of alcohol   Drug use: No   Sexual activity: Not on file  Other Topics Concern   Not on file  Social History Narrative   Not on file   Social Drivers of Health   Tobacco Use: Low Risk (03/17/2024)   Patient History    Smoking Tobacco Use: Never    Smokeless Tobacco Use: Never    Passive Exposure: Past  Financial Resource Strain: Not on file  Food Insecurity: No Food Insecurity (03/18/2024)   Epic    Worried About Programme Researcher, Broadcasting/film/video in the Last Year: Never true    Ran Out of Food in the Last Year: Never true  Transportation Needs: No Transportation Needs (03/18/2024)   Epic    Lack of Transportation (Medical): No    Lack of Transportation (Non-Medical): No  Physical Activity: Not on file  Stress: Not on file  Social Connections: Moderately Integrated (03/18/2024)   Social Connection and Isolation Panel    Frequency of Communication with Friends and Family: More than three times a week    Frequency of Social Gatherings with Friends and Family: More than three times a week    Attends Religious Services: More  than 4 times per year    Active Member of Clubs or Organizations: Yes    Attends Banker Meetings: 1 to 4 times per year    Marital Status: Widowed  Depression (PHQ2-9): Not on file  Alcohol Screen: Not on file  Housing: Low Risk (03/18/2024)   Epic    Unable to Pay for Housing in the Last Year: No    Number of Times Moved in the Last Year: 0    Homeless in the Last Year: No  Utilities: Not At Risk (03/18/2024)   Epic    Threatened with loss of utilities: No  Health Literacy: Not on file   Education Assessment and Provision:  Detailed education and instructions provided on heart failure disease management including the following:  Signs and symptoms of Heart Failure When to call the  physician Importance of daily weights Low sodium diet Fluid restriction Medication management Anticipated future follow-up appointments  Patient education given on each of the above topics.  Patient acknowledges understanding via teach back method and acceptance of all instructions.  Education Materials:  Living Better With Heart Failure Booklet, HF zone tool, & Daily Weight Tracker Tool.  Patient has scale at home: Yes Patient has pill box at home: yes    High Risk Criteria for Readmission and/or Poor Patient Outcomes: Heart failure hospital admissions (last 6 months): 1  No Show rate: 10 %  Difficult social situation: No, lives alone Demonstrates medication adherence: yes Primary Language: English , HOH  Literacy level: Reading, writing, and comprehension.   Barriers of Care:   [facility] Diet/ fluid restrictions Daily weights  Considerations/Referrals:   Referral made to Heart Failure Pharmacist Stewardship: Yes Referral made to Heart Failure CSW/NCM TOC: NA Referral made to Heart & Vascular TOC clinic: Yes, 03/24/2024 @ 3:30 pm   Items for Follow-up on DC/TOC: Continued HF education Diet/ fluid restrictions/ daily weights.    Stephane Haddock, BSN, Scientist, Clinical (histocompatibility And Immunogenetics) Only

## 2024-03-20 NOTE — Progress Notes (Signed)
 Mobility Specialist Progress Note:    03/20/24 1542  Mobility  Activity Ambulated with assistance  Level of Assistance Independent after set-up  Assistive Device Other (Comment) (IV Pole)  Distance Ambulated (ft) 300 ft  Range of Motion/Exercises Active  Activity Response Tolerated well  Mobility Referral Yes  Mobility visit 1 Mobility  Mobility Specialist Start Time (ACUTE ONLY) 1542  Mobility Specialist Stop Time (ACUTE ONLY) 1553  Mobility Specialist Time Calculation (min) (ACUTE ONLY) 11 min   Received pt laying in bed agreeable to session. No c/o any symptoms. Pt moving and ambulating well w/ no assist. Returned pt to bed w/ all needs met.   Venetia Keel Mobility Specialist Please Neurosurgeon or Rehab Office at [phone]

## 2024-03-20 NOTE — Progress Notes (Addendum)
 Progress Note   Patient: Carlos Gibson FMW:[id number] DOB: [birth year] DOA: 03/17/2024     3 DOS: the patient was seen and examined on 03/20/2024   Brief hospital course: Carlos Gibson was admitted to the hospital with the working diagnosis of heart failure decompensation.   83 y.o. male with heart failure, sp AICD implantation, atrial fibrillation, chronic kidney disease stage IIIb, diabetes mellitus, pulmonary hypertension, hypertension, dyslipidemia who presented with dyspnea. Reported progressive worsening dyspnea for 2 weeks prior to admission, consistent with dyspnea, orthopnea and lower extremity edema. Positive dietary indiscretion over the holidays.  On his initial physical examination his blood pressure was 115/67, HR 69, RR 17 and 02 saturation 98% Lungs with no wheezing, or rhonchi, decreased breath sounds bilaterally, heart with S1 and S2 present and regular with no gallops or rubs, abdomen with no distention, no lower extremity edema, positive scrotal edema.   Na 139, K 3.5 Cl 106 bicarbonate 24 glucose 228, bun 30 cr 2,53  BNP 3,081  Wbc 3.7 hgb 11.7 plt 106   Chest radiograph hypoinflation, positive cardiomegaly, bilateral hilar vascular congestion with prominent pulmonary artery at the right side, small bilateral pleural effusions, pacemaker in place with one right atrial, one right ventricle and left ventricle leads.   EKG 74 bpm, right axis deviation, qtc 557, left bundle branch block, atrial and ventricular pacing with PVC, with no significant ST segment or T wave changes.   Patient was placed on furosemide  IV for diuresis.   12/15 volume status is improving. Pending renal function to be more stable.      Assessment and Plan: * Acute on chronic systolic CHF (congestive heart failure) (HCC) Echocardiogram with reduced LV systolic function with EF 25 to 30%, global hypokinesis, with akinesis of the inferolateral wall. Severe cavity dilatation, grade II diastolic  dysfunction with pseudo normalization EA, interventricular flattened in systole and diastole, RV systolic function with mild reduction, RVSP 102.6 mmHg, tricuspid regurgitant velocity 4,68 m/s, severe dilatation LA and RA, moderate to severe mitral valve regurgitation, moderate tricuspid valve regurgitation, trivial pericardial effusion,   Urine output is 3,810 ml Systolic blood pressure 100 mmHg.   Continue with Bisoprolol , hydralazine  and isosorbide .  SGLT 2 inh  Holding loop diuretic today  Limited medical therapy due to acutely reduced GFR and risk of hypotension.   Essential hypertension Continue blood pressure monitoring Continue bisoprolol .   Atrial flutter (HCC) Continue rate control with bisoprolol  and amiodarone   Anticoagulation with warfarin  INR today 3.1   Chronic kidney disease, stage 3b (HCC) AKI  Today renal function with serum cr at 2.66 with K at 3,7 and serum bicarbonate at 30  Na 140 and Mg 1.9   Plan to add 20 kcl and 1 g mg sulfate, to keep K around 4 and Mg around 2  Continue with  SGLT 2 inh Follow up renal function and electrolytes in am.   Type 2 diabetes mellitus with hyperlipidemia (HCC) Continue glucose cover and monitoring with insulin  sliding scale.  Continue statin         Subjective: Patient with no chest pain, dyspnea and edema are improving, no PND, or orthopnea, no palpitations   Physical Exam: Vitals:   03/20/24 0300 03/20/24 0803 03/20/24 1001 03/20/24 1106  BP: (!) 116/59 109/61 (!) 110/55 95/73  Pulse: 70 (!) 106 70 97  Resp: (!) 23 20 18 17   Temp: (!) 97.5 F (36.4 C) 97.9 F (36.6 C)  (!) 97.4 F (36.3 C)  TempSrc: Oral Oral  Oral  SpO2: 98% 98% 100% 100%  Weight: 74.1 kg     Height: 5' 11 (1.803 m)      Neurology awake and alert ENt with mild pallor with no icterus Cardiovascular with S1 and S2 present and regular with no gallops, or rubs, positive systolic murmur at the left lower sternal border Respiratory with no  rales or wheezing, no rhonchi  Abdomen with no distention  No lower extremity edema  Data Reviewed:    Family Communication: no family at the bedside   Disposition: Status is: Inpatient Remains inpatient appropriate because: recovering heart failure   Planned Discharge Destination: Home     Author: Elidia Toribio Furnace, MD 03/20/2024 3:07 PM  For on call review [email or web address].

## 2024-03-20 NOTE — TOC CM/SW Note (Signed)
 Transition of Care [facility]) - Inpatient Brief Assessment   Patient Details  Name: Carlos Gibson MRN: [id number] Date of Birth: 07-Oct-1940  [facility] CM/SW Contact:    Waddell Barnie Rama, RN Phone Number: 03/20/2024, 4:24 PM   Clinical Narrative: From home alone, indep, has PCP and insurance on file, states has no HH services in place at this time or DME at home.  States family member  ( grand daughter) will transport them home at [employer] and family is support system, states gets medications from [facility] on [address].  Pta self ambulatory.   There are no ICM  needs identified  at this time.  Please place consult for ICM  needs.     Transition of Care Asessment: Insurance and Status: Insurance coverage has been reviewed Patient has primary care physician: Yes Home environment has been reviewed: home alone Prior level of function:: indep Prior/Current Home Services: No current home services Social Drivers of Health Review: SDOH reviewed no interventions necessary Readmission risk has been reviewed: Yes Transition of care needs: no transition of care needs at this time

## 2024-03-21 ENCOUNTER — Other Ambulatory Visit (HOSPITAL_COMMUNITY): Payer: Self-pay

## 2024-03-21 ENCOUNTER — Telehealth (HOSPITAL_COMMUNITY): Payer: Self-pay

## 2024-03-21 LAB — BASIC METABOLIC PANEL WITH GFR
Anion gap: 9 (ref 5–15)
BUN: 29 mg/dL — ABNORMAL HIGH (ref 8–23)
CO2: 27 mmol/L (ref 22–32)
Calcium: 8.9 mg/dL (ref 8.9–10.3)
Chloride: 104 mmol/L (ref 98–111)
Creatinine, Ser: 2.4 mg/dL — ABNORMAL HIGH (ref 0.61–1.24)
GFR, Estimated: 26 mL/min — ABNORMAL LOW (ref >60)
Glucose, Bld: 112 mg/dL — ABNORMAL HIGH (ref 70–99)
Potassium: 3.9 mmol/L (ref 3.5–5.1)
Sodium: 140 mmol/L (ref 135–145)

## 2024-03-21 LAB — GLUCOSE, CAPILLARY
Glucose-Capillary: 107 mg/dL — ABNORMAL HIGH (ref 70–99)
Glucose-Capillary: 170 mg/dL — ABNORMAL HIGH (ref 70–99)

## 2024-03-21 LAB — PROTIME-INR
INR: 2.8 — ABNORMAL HIGH (ref 0.8–1.2)
Prothrombin Time: 30.7 s — ABNORMAL HIGH (ref 11.4–15.2)

## 2024-03-21 MED ORDER — WARFARIN SODIUM 2.5 MG PO TABS
2.5000 mg | ORAL_TABLET | Freq: Once | ORAL | Status: AC
  Administered 2024-03-21: 16:00:00 2.5 mg via ORAL
  Filled 2024-03-21: qty 1

## 2024-03-21 MED ORDER — POTASSIUM CHLORIDE CRYS ER 20 MEQ PO TBCR
40.0000 meq | EXTENDED_RELEASE_TABLET | Freq: Once | ORAL | Status: AC
  Administered 2024-03-21: 09:00:00 40 meq via ORAL
  Filled 2024-03-21: qty 2

## 2024-03-21 MED ORDER — FUROSEMIDE 40 MG PO TABS
40.0000 mg | ORAL_TABLET | Freq: Every day | ORAL | 0 refills | Status: DC
  Filled 2024-03-21: qty 60, 30d supply, fill #0

## 2024-03-21 MED ORDER — FUROSEMIDE 40 MG PO TABS
40.0000 mg | ORAL_TABLET | Freq: Every day | ORAL | Status: DC
  Administered 2024-03-21: 16:00:00 40 mg via ORAL
  Filled 2024-03-21: qty 1

## 2024-03-21 NOTE — Discharge Summary (Signed)
 Physician Discharge Summary   Patient: Carlos Gibson MRN: [id number] DOB: 1940/06/27  Admit date:     03/17/2024  Discharge date: 03/21/2024  Discharge Physician: Elidia Sieving Lakeem Rozo   PCP: Charlott Dorn LABOR, MD   Recommendations at discharge:    Patient will take furosemide  40 mg daily, and increase to 40 mg bid in case of signs of volume overload, weight gain 2 to 3 lbs in 24 hrs and 5 lbs in 7 days.  Limited heart failure guideline directed medical therapy due to risk of hypotension and acutely reduced GFR, continue afterload reduction with hydralazine  and isosorbide .  Patient will be discharged with Foley catheter in place and plan to follow up with Urology 03/24/24 for voiding trial.  Follow up renal function and electrolytes in 7 days as outpatient Follow up with Dr Charlott in 7 to 10 days Follow up with Cardiology and Urology as scheduled.   Discharge Diagnoses: Principal Problem:   Acute on chronic systolic CHF (congestive heart failure) (HCC) Active Problems:   Essential hypertension   Atrial flutter (HCC)   Chronic kidney disease, stage 3b (HCC)   Type 2 diabetes mellitus with hyperlipidemia (HCC)  Resolved Problems:   * No resolved hospital problems. [facility] Course: Carlos Gibson was admitted to the hospital with the working diagnosis of heart failure decompensation.   83 y.o. male with heart failure, sp AICD implantation, atrial fibrillation, chronic kidney disease stage IIIb, diabetes mellitus, pulmonary hypertension, hypertension, dyslipidemia who presented with dyspnea. Reported progressive worsening dyspnea for 2 weeks prior to admission, consistent with dyspnea, orthopnea and lower extremity edema. Positive dietary indiscretion over the holidays.  On his initial physical examination his blood pressure was 115/67, HR 69, RR 17 and 02 saturation 98% Lungs with no wheezing, or rhonchi, decreased breath sounds bilaterally, heart with S1 and S2 present and  regular with no gallops or rubs, abdomen with no distention, no lower extremity edema, positive scrotal edema.   Na 139, K 3.5 Cl 106 bicarbonate 24 glucose 228, bun 30 cr 2,53  BNP 3,081  Wbc 3.7 hgb 11.7 plt 106   Chest radiograph hypoinflation, positive cardiomegaly, bilateral hilar vascular congestion with prominent pulmonary artery at the right side, small bilateral pleural effusions, pacemaker in place with one right atrial, one right ventricle and left ventricle leads.   EKG 74 bpm, right axis deviation, qtc 557, left bundle branch block, atrial and ventricular pacing with PVC, with no significant ST segment or T wave changes.   Patient was placed on furosemide  IV for diuresis.   12/13 foley placed for urinary retention, his renal ultrasound had mild bilateral hydronephrosis, with post void bladder residual of 649 ml. Noted prostatomegaly, suggesting benign prostatic hyperplasia.   12/15 volume status is improving. Pending renal function to be more stable.  12/16 renal function is more stable, plan to follow up as outpatient.  Urology follow up on 12/19 for voiding trial.   Assessment and Plan: * Acute on chronic systolic CHF (congestive heart failure) (HCC) Echocardiogram with reduced LV systolic function with EF 25 to 30%, global hypokinesis, with akinesis of the inferolateral wall. Severe cavity dilatation, grade II diastolic dysfunction with pseudo normalization EA, interventricular flattened in systole and diastole, RV systolic function with mild reduction, RVSP 102.6 mmHg, tricuspid regurgitant velocity 4,68 m/s, severe dilatation LA and RA, moderate to severe mitral valve regurgitation, moderate tricuspid valve regurgitation, trivial pericardial effusion,   Patient was placed on IV furosemide  for diuresis, negative fluid balance was  achieved, -  8,249 ml, with significant improvement in his symptoms.   Plan to continue with Bisoprolol , hydralazine  and isosorbide .  Diuresis with  loop diuretic No SGLT 2 inh due to urinary retention, requiring foley catheter.    Limited medical therapy due to acutely reduced GFR and risk of hypotension.   Essential hypertension Afterload reduction with hydralazine  and isosorbide .  Continue bisoprolol .   Atrial flutter (HCC) Continue rate control with bisoprolol  and amiodarone   Anticoagulation with warfarin  INR today 2.8   Chronic kidney disease, stage 3b (HCC) AKI  At the time of his discharge his renal function has a serum cr of 2,4 with K at 3,9 and serum bicarbonate at 27  Na 140 and Mg 1,9   Patient had 2 g mg sulfate IV before his discharge.  Plan to continue loop diuretic at home and follow up renal function and electrolytes as outpatient.   Urinary retention with mild bilateral hydronephrosis, due to BPH.  Plan to continue foley catheter and follow up as outpatient with urology, continue with finasteride  and alfuzosin .   Type 2 diabetes mellitus with hyperlipidemia (HCC) Patient was placed on insulin  sliding scale for glucose cover and monitoring, during his hospitalization  At the time of discharge he will resume his diabetic regimen.  Continue statin       Consultants: cardiology  Procedures performed: none   Disposition: Home Diet recommendation:  Cardiac diet DISCHARGE MEDICATION: Allergies as of 03/21/2024       Reactions   Ace Inhibitors Cough   Coreg  [carvedilol ] Other (See Comments)   feels bad   Metformin  Hcl Diarrhea   Sulfa Antibiotics Other (See Comments)   Patient cannot recall symptoms   Toprol  Xl [metoprolol  Tartrate] Other (See Comments)   feels bad        Medication List     TAKE these medications    alfuzosin  10 MG 24 hr tablet Commonly known as: UROXATRAL  Take 10 mg by mouth daily.   amiodarone  200 MG tablet Commonly known as: PACERONE  Take 1 tablet (200 mg total) by mouth daily.   atorvastatin  10 MG tablet Commonly known as: LIPITOR Take 10 mg by mouth in the  morning.   bisoprolol  5 MG tablet Commonly known as: ZEBETA  Take 1 tablet (5 mg total) by mouth daily.   cholecalciferol  25 MCG (1000 UNIT) tablet Commonly known as: VITAMIN D3 Take 1,000 Units by mouth daily. What changed: when to take this   cyanocobalamin  1000 MCG tablet Commonly known as: VITAMIN B12 Take 1,000 mcg by mouth daily. What changed: when to take this   finasteride  5 MG tablet Commonly known as: PROSCAR  Take 5 mg by mouth in the morning.   furosemide  40 MG tablet Commonly known as: LASIX  Take 1 tablet (40 mg total) by mouth daily. In case of weight gain 2 to 3 lbs in 24 hrs or 5 lbs in 7 days, take 1 tablet twice daily What changed:  when to take this reasons to take this additional instructions   hydrALAZINE  25 MG tablet Commonly known as: APRESOLINE  Take 25 mg by mouth 2 (two) times daily.   isosorbide  mononitrate 30 MG 24 hr tablet Commonly known as: IMDUR  Take 30 mg by mouth daily.   warfarin 5 MG tablet Commonly known as: COUMADIN  Take as directed. If you are unsure how to take this medication, talk to your nurse or doctor. Original instructions: TAKE 1/2 TO 1 TABLET BY MOUTH ONCE DAILY AS DIRECTED by anticoagulation clinic What  changed:  how much to take how to take this when to take this additional instructions        Follow-up Information     [facility]. Go in 3 day(s).   Specialty: Cardiology Why: Hospital follow up 03/24/2024 @ 3:30 pm PLEASE bring a current medication list to appointment FREE valet parking, Entrance C, off [employer] [employer] entrance. Contact information: [address] [place]  [address] [phone]        Charlott Dorn LABOR, MD. Go on 03/31/2024.   Specialty: Internal Medicine Why: @10 :30AM Contact information: [address] [place] [address] [phone]         [facility] Follow up on 03/24/2024.   Why: 8:00 Contact information: [address] [place]  [address] [phone]               Discharge Exam: Filed Weights   03/19/24 0500 03/20/24 0300 03/21/24 0416  Weight: 74.3 kg 74.1 kg 72.6 kg   BP (!) 98/53 (BP Location: Right Arm)   Pulse 88   Temp (!) 97.5 F (36.4 C) (Oral)   Resp 15   Ht 5' 11 (1.803 m)   Wt 72.6 kg   SpO2 100%   BMI 22.32 kg/m   Patient is feeling better, no chest pain and no dyspnea, no PND, orthopnea or lower extremity edema.   Neurology awake and alert ENT with no pallor with no icterus Cardiovascular with S1 and S2 present with no gallops or rubs, positive systolic murmur at the left lower sternal border No JVD Respiratory with no rales or wheezing, no rhonchi Abdomen with no distention, soft and non tender No lower extremity edema   Condition at discharge: stable  The results of significant diagnostics from this hospitalization (including imaging, microbiology, ancillary and laboratory) are listed below for reference.   Imaging Studies: ECHOCARDIOGRAM COMPLETE Result Date: 03/18/2024    ECHOCARDIOGRAM REPORT   Patient Name:   EFTON THOMLEY Date of Exam: 03/18/2024 Medical Rec #:  [id number]     Height:       71.0 in Accession #:    [id number]    Weight:       183.0 lb Date of Birth:  25-Nov-1940     BSA:          2.030 m Patient Age:    83 years      BP:           120/65 mmHg Patient Gender: M             HR:           69 bpm. Exam Location:  Inpatient Procedure: 2D Echo (Both Spectral and Color Flow Doppler were utilized during            procedure). Indications:    acute systolic chf  History:        Patient has prior history of Echocardiogram examinations, most                 recent 11/06/2021. Cardiomyopathy and CHF, Defibrillator and                 Pacemaker, Arrythmias:RBBB, PVC and Atrial Fibrillation; Risk                 Factors:Dyslipidemia and Hypertension.   Sonographer:    Tinnie Barefoot RDCS Referring Phys: [id number]  PETER K ACHEAMPONG  Sonographer Comments: Suboptimal subcostal window. IMPRESSIONS  1. Global hypokinesis with akinesis of the inferolateral wall; overall severe LV dysfunction.  2. Left ventricular ejection fraction, by estimation, is 25 to 30%. The left ventricle has severely decreased function. The left ventricle demonstrates regional wall motion abnormalities (see scoring diagram/findings for description). The left ventricular internal cavity size was severely dilated. Left ventricular diastolic parameters are consistent with Grade II diastolic dysfunction (pseudonormalization). There is the interventricular septum is flattened in systole and diastole, consistent with right ventricular pressure and volume overload.  3. Right ventricular systolic function is mildly reduced. The right ventricular size is normal. There is severely elevated pulmonary artery systolic pressure.  4. Left atrial size was severely dilated.  5. Right atrial size was severely dilated.  6. Moderate pleural effusion in the left lateral region.  7. The mitral valve is normal in structure. Moderate to severe mitral valve regurgitation. No evidence of mitral stenosis.  8. Tricuspid valve regurgitation is moderate.  9. The aortic valve is tricuspid. Aortic valve regurgitation is mild. No aortic stenosis is present. 10. The inferior vena cava is normal in size with greater than 50% respiratory variability, suggesting right atrial pressure of 3 mmHg. FINDINGS  Left Ventricle: Left ventricular ejection fraction, by estimation, is 25 to 30%. The left ventricle has severely decreased function. The left ventricle demonstrates regional wall motion abnormalities. The left ventricular internal cavity size was severely dilated. There is no left ventricular hypertrophy. The interventricular septum is flattened in systole and diastole, consistent with right ventricular pressure and volume  overload. Left ventricular diastolic parameters are consistent with Grade II diastolic dysfunction (pseudonormalization). Right Ventricle: The right ventricular size is normal. Right ventricular systolic function is mildly reduced. There is severely elevated pulmonary artery systolic pressure. The tricuspid regurgitant velocity is 4.68 m/s, and with an assumed right atrial pressure of 15 mmHg, the estimated right ventricular systolic pressure is 102.6 mmHg. Left Atrium: Left atrial size was severely dilated. Right Atrium: Right atrial size was severely dilated. Pericardium: Trivial pericardial effusion is present. Mitral Valve: The mitral valve is normal in structure. Moderate to severe mitral valve regurgitation. No evidence of mitral valve stenosis. Tricuspid Valve: The tricuspid valve is normal in structure. Tricuspid valve regurgitation is moderate . No evidence of tricuspid stenosis. Aortic Valve: The aortic valve is tricuspid. Aortic valve regurgitation is mild. No aortic stenosis is present. Pulmonic Valve: The pulmonic valve was normal in structure. Pulmonic valve regurgitation is mild. No evidence of pulmonic stenosis. Aorta: The aortic root is normal in size and structure. Venous: The inferior vena cava is normal in size with greater than 50% respiratory variability, suggesting right atrial pressure of 3 mmHg. IAS/Shunts: No atrial level shunt detected by color flow Doppler. Additional Comments: Global hypokinesis with akinesis of the inferolateral wall; overall severe LV dysfunction. A device lead is visualized. There is a moderate pleural effusion in the left lateral region.  LEFT VENTRICLE PLAX 2D LVIDd:         7.20 cm      Diastology LVIDs:         6.70 cm      LV e' medial:    5.55 cm/s LV PW:         0.90 cm      LV E/e' medial:  15.1 LV IVS:        1.00 cm      LV e' lateral:   6.74 cm/s LVOT diam:  2.40 cm      LV E/e' lateral: 12.4 LV SV:         49 LV SV Index:   24 LVOT Area:     4.52 cm LV  IVRT:       106 msec  LV Volumes (MOD) LV vol d, MOD A2C: 252.0 ml LV vol d, MOD A4C: 220.0 ml LV vol s, MOD A2C: 164.0 ml LV vol s, MOD A4C: 159.0 ml LV SV MOD A2C:     88.0 ml LV SV MOD A4C:     220.0 ml LV SV MOD BP:      77.1 ml RIGHT VENTRICLE             IVC RV Basal diam:  3.95 cm     IVC diam: 3.70 cm RV S prime:     10.60 cm/s TAPSE (M-mode): 1.9 cm LEFT ATRIUM              Index         RIGHT ATRIUM           Index LA diam:        6.70 cm  3.30 cm/m    RA Area:     37.70 cm LA Vol (A2C):   216.0 ml 106.38 ml/m  RA Volume:   143.00 ml 70.43 ml/m LA Vol (A4C):   248.0 ml 122.14 ml/m LA Biplane Vol: 236.0 ml 116.23 ml/m  AORTIC VALVE             PULMONIC VALVE LVOT Vmax:   66.30 cm/s  PR End Diast Vel: 13.69 msec LVOT Vmean:  39.900 cm/s LVOT VTI:    0.109 m  AORTA Ao Root diam: 3.70 cm Ao Asc diam:  3.50 cm MITRAL VALVE                  TRICUSPID VALVE MV Area (PHT): 3.68 cm       TR Peak grad:   87.6 mmHg MV Decel Time: 206 msec       TR Vmax:        468.00 cm/s MR Peak grad:    54.5 mmHg MR Mean grad:    31.0 mmHg    SHUNTS MR Vmax:         369.00 cm/s  Systemic VTI:  0.11 m MR Vmean:        252.0 cm/s   Systemic Diam: 2.40 cm MR PISA:         2.26 cm MR PISA Eff ROA: 24 mm MR PISA Radius:  0.60 cm MV E velocity: 83.90 cm/s MV A velocity: 55.50 cm/s MV E/A ratio:  1.51 Redell Shallow MD Electronically signed by Redell Shallow MD Signature Date/Time: 03/18/2024/3:55:56 PM    Final    US  RENAL Result Date: 03/18/2024 EXAM: US  Retroperitoneum Complete, Renal. 03/18/2024 01:47:21 AM TECHNIQUE: Real-time ultrasonography of the retroperitoneum renal was performed. COMPARISON: CT abdomen/pelvis dated 12/23/2023 CLINICAL HISTORY: Acute renal failure (ARF) FINDINGS: FINDINGS: RIGHT KIDNEY/URETER: The right kidney measures 10.4 x 5.3 x 5.4 cm with a volume of 157 mL. Normal cortical echogenicity. Mild hydronephrosis. No calculus. Simple 2.1 cm interpolar right renal cyst. LEFT KIDNEY/URETER: The left  kidney measures 11.4 x 6.1 x 5.8 cm with a volume of 212 mL. Normal cortical echogenicity. Mild hydronephrosis. No calculus. Simple 3.2 cm interpolar left renal cyst. BLADDER: Post-void residual volume is 649 mL. Prostatomegaly, suggesting BPH. Unremarkable appearance of the bladder. IMPRESSION: 1. Mild bilateral hydronephrosis, likely secondary  to bladder outlet obstruction. 2. Post-void bladder residual of 649 mL. 3. Prostatomegaly, suggesting benign prostatic hyperplasia. Electronically signed by: Pinkie Pebbles MD 03/18/2024 01:53 AM EST RP Workstation: [id number]   DG Chest 2 View Result Date: 03/17/2024 CLINICAL DATA:  Shortness of breath EXAM: CHEST - 2 VIEW COMPARISON:  01/27/2018 FINDINGS: Left-sided multi lead cardiac pacing device. Cardiomegaly with central congestion. Small bilateral effusions. Aortic atherosclerosis IMPRESSION: Cardiomegaly with central congestion and small bilateral effusions. Electronically Signed   By: Luke Bun M.D.   On: 03/17/2024 18:58    Microbiology: Results for orders placed or performed during the hospital encounter of 03/26/21  SARS CORONAVIRUS 2 (TAT 6-24 HRS) Nasopharyngeal Nasopharyngeal Swab     Status: Abnormal   Collection Time: 03/26/21  2:30 PM   Specimen: Nasopharyngeal Swab  Result Value Ref Range Status   SARS Coronavirus 2 POSITIVE (A) NEGATIVE Final    Comment: (NOTE) SARS-CoV-2 target nucleic acids are DETECTED.  The SARS-CoV-2 RNA is generally detectable in upper and lower respiratory specimens during the acute phase of infection. Positive results are indicative of the presence of SARS-CoV-2 RNA. Clinical correlation with patient history and other diagnostic information is  necessary to determine patient infection status. Positive results do not rule out bacterial infection or co-infection with other viruses.  The expected result is Negative.  Fact Sheet for Patients: hairslick.no  Fact Sheet for  Healthcare Providers: quierodirigir.com  This test is not yet approved or cleared by the [place]  FDA and  has been authorized for detection and/or diagnosis of SARS-CoV-2 by FDA under an Emergency Use Authorization (EUA). This EUA will remain  in effect (meaning this test can be used) for the duration of the COVID-19 declaration under Section 564(b)(1) of the Act, 21 U. S.C. section 360bbb-3(b)(1), unless the authorization is terminated or revoked sooner.   Performed at [facility], [address], [place], [place] [address]     Labs: CBC: Recent Labs  Lab 03/17/24 1708 03/18/24 0028 03/19/24 0232 03/20/24 0238  WBC 3.7* 4.3 4.7 4.8  HGB 11.7* 11.6* 11.1* 11.9*  HCT 35.9* 35.3* 34.0* 36.1*  MCV 90.4 89.1 89.0 88.9  PLT 106* 102* 103* 116*   Basic Metabolic Panel: Recent Labs  Lab 03/17/24 1708 03/18/24 0356 03/19/24 0232 03/20/24 0238 03/21/24 0221  NA 139 139 139 140 140  K 3.5 3.6 3.5 3.7 3.9  CL 106 105 105 103 104  CO2 24 27 24 30 27   GLUCOSE 228* 177* 151* 127* 112*  BUN 30* 28* 29* 28* 29*  CREATININE 2.53* 2.29* 2.15* 2.66* 2.40*  CALCIUM  8.4* 8.7* 8.9 8.6* 8.9  MG  --   --   --  1.9  --    Liver Function Tests: No results for input(s): AST, ALT, ALKPHOS, BILITOT, PROT, ALBUMIN in the last 168 hours. CBG: Recent Labs  Lab 03/20/24 1103 03/20/24 1604 03/20/24 2152 03/21/24 0602 03/21/24 1128  GLUCAP 174* 120* 134* 107* 170*    Discharge time spent: greater than 30 minutes.  Signed: Elidia Toribio Furnace, MD [facility] 03/21/2024

## 2024-03-21 NOTE — Telephone Encounter (Signed)
 Pharmacy Patient Advocate Encounter  Insurance verification completed.    The patient is insured through [facility]. Patient has Medicare and is not eligible for a copay card, but may be able to apply for patient assistance or Medicare RX Payment Plan (Patient Must reach out to their plan, if eligible for payment plan), if available.    Ran test claim for Jardiance  10mg  tablet and the current 30 day co-pay is $47.   This test claim was processed through [facility]- copay amounts may vary at other pharmacies due to [employer], or as the patient moves through the different stages of their insurance plan.

## 2024-03-21 NOTE — Progress Notes (Signed)
° °  Heart Failure Stewardship Pharmacist Progress Note   PCP: Charlott Dorn LABOR, MD PCP-Cardiologist: Oneil Parchment, MD    HPI:  83 yo M with PMH of CHF, NICM, AICD, afib, CKD IIIb, T2DM, pulmonary HTN, HTN and HLD.   Presented to the ED on 12/12 with worsening shortness of breath, orthopnea, and PND. Reported compliance with his medications but did have dietary indiscretion over Thanksgiving. CXR with central congestion and small bilateral effusions. LVEF 25-30%, RWMA, G2DD, RV mildly reduced, severely elevated PA pressure. BNP elevated. Started on IV lasix  and cardiology consulted. Foley placed 12/13.  States he is feeling well. Denies shortness of breath. Able to lay flat. No LE edema. Reviewed GDMT and goals of therapy. Foley is going to be removed today prior to discharge.   Current HF Medications: Beta Blocker: bisoprolol  5 mg daily Other: hydralazine  25 mg BID + Imdur  30 mg daily  Prior to admission HF Medications: Diuretic: furosemide  40 mg daily PRN Beta blocker: bisoprolol  5 mg daily Other: hydralazine  25 mg BID + Imdur  30 mg daily  Pertinent Lab Values: Serum creatinine 2.40, BUN 29, Potassium 3.9, Sodium 140, BNP 3081, Magnesium  1.9, A1c 7.5   Vital Signs: Weight: 160 lbs (admission weight: ~183 lbs) Blood pressure: 100/60s  Heart rate: 70s  I/O: net -2.4L yesterday; net -8.5L since admission  Medication Assistance / Insurance Benefits Check: Does the patient have prescription insurance?  Yes Type of insurance plan: [facility] Medicare  Outpatient Pharmacy:  Prior to admission outpatient pharmacy: [facility] Is the patient willing to use [facility] TOC pharmacy at discharge? Yes Is the patient willing to transition their outpatient pharmacy to utilize a [facility] outpatient pharmacy?   No    Assessment: 1. Acute on chronic systolic CHF (LVEF 25-30%), due to NICM. NYHA class III symptoms. - Off lasix  yesterday with creatinine bump - improved today. Consider resuming PO  today - Continue bisoprolol  5 mg daily - Holding Jardiance  while foley is in place - Continue hydralazine  25 mg BID + Imdur  30 mg daily    Plan: 1) Medication changes recommended at this time: - Furosemide  40 mg PO daily + additional 40 PRN - Restart Jardiance  10 mg daily once foley out  2) Patient assistance: - States that copays are affordable for him - Jardiance  copay $47  3)  Education  - Patient has been educated on current HF medications and potential additions to HF medication regimen - Patient verbalizes understanding that over the next few months, these medication doses may change and more medications may be added to optimize HF regimen - Patient has been educated on basic disease state pathophysiology and goals of therapy   Duwaine Plant, PharmD, BCPS Heart Failure Stewardship Pharmacist Phone [phone]

## 2024-03-21 NOTE — Progress Notes (Signed)
 Rounding Note   Patient Name: Carlos Gibson Date of Encounter: 03/21/2024  [facility] Cardiologist: Oneil Parchment, MD   Subjective No CP or dyspnea  Scheduled Meds:  alfuzosin   10 mg Oral Daily   amiodarone   200 mg Oral Daily   atorvastatin   10 mg Oral QHS   bisoprolol   5 mg Oral Daily   Chlorhexidine  Gluconate Cloth  6 each Topical Daily   cyanocobalamin   1,000 mcg Oral Daily   finasteride   5 mg Oral Daily   hydrALAZINE   25 mg Oral BID   insulin  aspart  0-9 Units Subcutaneous TID WC   isosorbide  mononitrate  30 mg Oral Daily   warfarin  2.5 mg Oral ONCE-1600   Warfarin - Pharmacist Dosing Inpatient   Does not apply q1600   Continuous Infusions:  PRN Meds: acetaminophen , ondansetron  (ZOFRAN ) IV   Vital Signs  Vitals:   03/20/24 1930 03/21/24 0020 03/21/24 0416 03/21/24 0729  BP: 105/62 108/61 104/66 105/63  Pulse: 70 69 68 70  Resp: 18 16 20 20   Temp: (!) 97.4 F (36.3 C) 97.6 F (36.4 C) 97.8 F (36.6 C) 98.3 F (36.8 C)  TempSrc: Oral Oral Oral Oral  SpO2: 100% 97% 100% 96%  Weight:   72.6 kg   Height:        Intake/Output Summary (Last 24 hours) at 03/21/2024 0938 Last data filed at 03/21/2024 9175 Gross per 24 hour  Intake 940 ml  Output 3300 ml  Net -2360 ml      03/21/2024    4:16 AM 03/20/2024    3:00 AM 03/19/2024    5:00 AM  Last 3 Weights  Weight (lbs) 160 lb 0.9 oz 163 lb 5.8 oz 163 lb 12.8 oz  Weight (kg) 72.6 kg 74.1 kg 74.3 kg      Telemetry AV pacing - Personally Reviewed   Physical Exam  GEN: NAD WD Neck: supple, no TM Cardiac: RRR, 3/6 systolic murmur apex; no DM Respiratory: CTA; no wheeze GI: Soft, NT/ND, no masses MS: No edema. Neuro:  No focal findings Psych: Normal affect   Labs     Chemistry Recent Labs  Lab 03/19/24 0232 03/20/24 0238 03/21/24 0221  NA 139 140 140  K 3.5 3.7 3.9  CL 105 103 104  CO2 24 30 27   GLUCOSE 151* 127* 112*  BUN 29* 28* 29*  CREATININE 2.15* 2.66* 2.40*  CALCIUM   8.9 8.6* 8.9  MG  --  1.9  --   GFRNONAA 30* 23* 26*  ANIONGAP 10 7 9      Hematology Recent Labs  Lab 03/18/24 0028 03/19/24 0232 03/20/24 0238  WBC 4.3 4.7 4.8  RBC 3.96* 3.82* 4.06*  HGB 11.6* 11.1* 11.9*  HCT 35.3* 34.0* 36.1*  MCV 89.1 89.0 88.9  MCH 29.3 29.1 29.3  MCHC 32.9 32.6 33.0  RDW 14.3 14.6 14.4  PLT 102* 103* 116*   Thyroid   Recent Labs  Lab 03/19/24 0232  TSH 10.661*    BNP Recent Labs  Lab 03/17/24 1708  BNP 3,081.4*        Patient Profile   83 year old male with past medical history of nonischemic cardiomyopathy, paroxysmal atrial fibrillation/flutter, mitral regurgitation, prior ICD, chronic stage III kidney disease for evaluation of acute on chronic combined systolic/diastolic congestive heart failure.  Echocardiogram this admission shows ejection fraction 25 to 30% with akinesis of the inferolateral wall, severe left ventricular enlargement, grade 2 diastolic dysfunction, mild RV dysfunction, severe pulmonary hypertension, severe biatrial enlargement,  pleural effusion, moderate to severe mitral regurgitation, moderate tricuspid regurgitation, mild aortic insufficiency.  Assessment & Plan  1 acute on chronic combined systolic/diastolic congestive heart failure-symptoms have improved.  Would resume Lasix  40 mg daily and he will take an additional 40 mg daily at home for weight gain of 2 to 3 pounds.  We have not added spironolactone  in the setting of significant renal insufficiency.     2 nonischemic cardiomyopathy-LV function remains severely reduced with moderate to severe mitral regurgitation which is unchanged.  Continue bisoprolol .  He is not on ARB or Entresto  due to severity of renal insufficiency.  Continue hydralazine /nitrates.   3 mitral regurgitation murmur-moderate to severe on follow-up echocardiogram and unchanged compared to previous.   4 chronic stage III kidney disease-renal function with some improvement today.  Resume Lasix  40 mg  daily as outlined above.   5 paroxysmal atrial fibrillation-continue amiodarone  and Coumadin  with goal INR 2-3.  Patient is in AV paced rhythm at present.   6 history of ICD  7 Elevated TSH-per primary service.   Patient can be discharged from a cardiac standpoint.  Needs potassium and renal function checked in 1 week.  Will arrange follow-up with APP 2 weeks.  Follow-up Dr. Inocencio 3 months.  For questions or updates, please contact [facility] Please consult [email or web address] for contact info under     Signed, Redell Shallow, MD  03/21/2024, 9:38 AM

## 2024-03-21 NOTE — Progress Notes (Signed)
 ANTICOAGULATION CONSULT NOTE  Pharmacy Consult for Warfarin Indication: atrial flutter  Allergies[1]  Patient Measurements: Height: 5' 11 (180.3 cm) Weight: 72.6 kg (160 lb 0.9 oz) IBW/kg (Calculated) : 75.3  Vital Signs: Temp: 98.3 F (36.8 C) (12/16 0729) Temp Source: Oral (12/16 0729) BP: 105/63 (12/16 0729) Pulse Rate: 70 (12/16 0729)  Labs: Recent Labs    03/19/24 0232 03/20/24 0238 03/21/24 0221  HGB 11.1* 11.9*  --   HCT 34.0* 36.1*  --   PLT 103* 116*  --   LABPROT 36.5* 33.0* 30.7*  INR 3.5* 3.1* 2.8*  CREATININE 2.15* 2.66* 2.40*    Estimated Creatinine Clearance: 23.9 mL/min (A) (by C-G formula based on SCr of 2.4 mg/dL (H)).   Medical History: Past Medical History:  Diagnosis Date   Aortic regurgitation    a. mild AI by echo 01/2016.   Aortic root dilation    Aortic valve regurgitation    Bifascicular block    Chronic systolic CHF (congestive heart failure) (HCC)    CKD (chronic kidney disease), stage III (HCC) 01/01/2016   Diabetes (HCC)    Ejection fraction < 50%    Frequent PVCs    Gilbert's syndrome    Hyperlipidemia    Mild diastolic dysfunction    Mitral regurgitation    a. mod by echo 10/207.   NICM (nonischemic cardiomyopathy) (HCC)    Pericardial effusion    a. small by echo 01/2016.   Pulmonary hypertension (HCC)    Right BBB/left post fasc block    Thrombocytopenia     Medications:  Medications Prior to Admission  Medication Sig Dispense Refill Last Dose/Taking   alfuzosin  (UROXATRAL ) 10 MG 24 hr tablet Take 10 mg by mouth daily.   03/17/2024 at  8:00 AM   amiodarone  (PACERONE ) 200 MG tablet Take 1 tablet (200 mg total) by mouth daily. 30 tablet 11 03/17/2024 at  8:00 AM   atorvastatin  (LIPITOR) 10 MG tablet Take 10 mg by mouth in the morning.   03/17/2024 at  8:00 AM   bisoprolol  (ZEBETA ) 5 MG tablet Take 1 tablet (5 mg total) by mouth daily. 90 tablet 1 03/17/2024 at  8:00 AM   cholecalciferol  (VITAMIN D3) 25 MCG (1000  UNIT) tablet Take 1,000 Units by mouth daily. (Patient taking differently: Take 1,000 Units by mouth every other day.)   03/17/2024 at  8:00 AM   cyanocobalamin  (VITAMIN B12) 1000 MCG tablet Take 1,000 mcg by mouth daily. (Patient taking differently: Take 1,000 mcg by mouth every other day.)   03/17/2024 at  8:00 AM   finasteride  (PROSCAR ) 5 MG tablet Take 5 mg by mouth in the morning.   03/17/2024 at  8:00 AM   furosemide  (LASIX ) 40 MG tablet Take 1 tablet (40 mg total) by mouth daily as needed for fluid. Must have office visit with Dr Jeffrie for further refills or obtain from PCP 90 tablet 0 03/17/2024   hydrALAZINE  (APRESOLINE ) 25 MG tablet Take 25 mg by mouth 2 (two) times daily.   03/17/2024 at  8:00 AM   isosorbide  mononitrate (IMDUR ) 30 MG 24 hr tablet Take 30 mg by mouth daily.   03/17/2024 at  8:00 AM   warfarin (COUMADIN ) 5 MG tablet TAKE 1/2 TO 1 TABLET BY MOUTH ONCE DAILY AS DIRECTED by anticoagulation clinic (Patient taking differently: Take 2.5-5 mg by mouth at bedtime. 5 mg (5 mg x 1) every Tue, Thu, Sat; 2.5 mg (5 mg x 0.5) all other days) 30 tablet 5 03/16/2024  Scheduled:   alfuzosin   10 mg Oral Daily   amiodarone   200 mg Oral Daily   atorvastatin   10 mg Oral QHS   bisoprolol   5 mg Oral Daily   Chlorhexidine  Gluconate Cloth  6 each Topical Daily   cyanocobalamin   1,000 mcg Oral Daily   finasteride   5 mg Oral Daily   hydrALAZINE   25 mg Oral BID   insulin  aspart  0-9 Units Subcutaneous TID WC   isosorbide  mononitrate  30 mg Oral Daily   potassium chloride   40 mEq Oral Once   Warfarin - Pharmacist Dosing Inpatient   Does not apply q1600   Infusions:    PRN: acetaminophen , ondansetron  (ZOFRAN ) IV  Assessment: 3 yom with a history of AF on warfarin, HF, ICD, HTN. Patient is presenting with SOB. Warfarin per pharmacy consult placed for atrial flutter.  Patient taking warfarin prior to arrival. Home dose is 5 mg (5 mg x 1) every Tue, Thu, Sat; 2.5 mg (5 mg x 0.5) all other  days. Last taken 12/11 evening per patient.  12/16 - INR today is therapeutic at 2.8 down from 3.1 yesterday. CBC is stable (Hgb 11.9, plt 116). No signs of bleeding noted.   Goal of Therapy:  INR Goal 2-3 Monitor platelets by anticoagulation protocol: Yes   Plan:  Warfarin 2.5 mg today Daily INR, s/s bleeding  Thank you for allowing pharmacy to be involved with this patient's care.  Mendel Barter, PharmD PGY1 Clinical Pharmacist [facility]  03/21/2024 7:47 AM    [1]  Allergies Allergen Reactions   Ace Inhibitors Cough   Coreg  [Carvedilol ] Other (See Comments)    feels bad   Metformin  Hcl Diarrhea   Sulfa Antibiotics Other (See Comments)    Patient cannot recall symptoms   Toprol  Xl [Metoprolol  Tartrate] Other (See Comments)    feels bad

## 2024-03-21 NOTE — Care Management Important Message (Signed)
 Important Message  Patient Details  Name: Carlos Gibson MRN: [id number] Date of Birth: [birth year]   Important Message Given:  Yes - Medicare IM     [name] 03/21/2024, 11:04 AM

## 2024-03-21 NOTE — TOC Transition Note (Signed)
 Transition of Care [facility]) - Discharge Note   Patient Details  Name: Carlos Gibson MRN: [id number] Date of Birth: May 28, 1940  [facility]) CM/SW Contact:  Waddell Barnie Rama, RN Phone Number: 03/21/2024, 11:06 AM   Clinical Narrative:    For possible dc, has transport at dc.         Patient Goals and CMS Choice            Discharge Placement                       Discharge Plan and Services Additional resources added to the After Visit Summary for                                       Social Drivers of Health (SDOH) Interventions SDOH Screenings   Food Insecurity: No Food Insecurity (03/18/2024)  Housing: Unknown (03/20/2024)  Transportation Needs: No Transportation Needs (03/18/2024)  Utilities: Not At Risk (03/18/2024)  Alcohol Screen: Low Risk (03/20/2024)  Financial Resource Strain: Low Risk (03/20/2024)  Social Connections: Moderately Integrated (03/18/2024)  Tobacco Use: Low Risk (03/17/2024)     Readmission Risk Interventions    03/20/2024    4:23 PM  Readmission Risk Prevention Plan  Transportation Screening Complete  PCP or Specialist Appt within 5-7 Days Complete  Home Care Screening Complete  Medication Review (RN CM) Complete

## 2024-03-21 NOTE — Progress Notes (Signed)
 Occupational Therapy Evaluation and Discharge Patient Details Name: Carlos Gibson MRN: [id number] DOB: [birth year] Today's Date: 03/21/2024   History of Present Illness   Pt is an 83 y.o. male presenting 03/17/2024 with worsening exertional dyspnea. Found to have acute on chronic systolic CHF exacerbation. ECHO shows 25-30% EF. PMH: CHF, AICD placement, afib, CKD IIIb, DM, pulmonary HTN, dyslipidemia.     Clinical Impressions Pt is typically independent in ADL and mobility without DME. 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 He was able to demonstrate UB and LB ADL including dynamic seated and standing balance. Sink level grooming and transfers without any assist or risk of LOB. In addition to functional tasks OT focused on energy conservation strategies for ADL. Pt very pleasant and cooperative and motivated throughout session. Since Pt is at baseline for ADL OT will sign off at this time.      If plan is discharge home, recommend the following:    (n/a)     Functional Status Assessment   Patient has not had a recent decline in their functional status     Equipment Recommendations   None recommended by OT     Recommendations for Other Services         Precautions/Restrictions   Precautions Precautions: None Recall of Precautions/Restrictions: Intact Restrictions Weight Bearing Restrictions Per Provider Order: No     Mobility Bed Mobility Overal bed mobility: Independent                  Transfers Overall transfer level: Independent Equipment used: None                      Balance Overall balance assessment: Independent                                         ADL either performed or assessed with clinical judgement   ADL Overall ADL's : Modified independent                                       General ADL Comments: no assist for LB dressing,  transfers, sink level grooming, displayed reaching/leaning, fine motor and sequencing of functional tasks without cues.     Vision Patient Visual Report: No change from baseline       Perception         Praxis         Pertinent Vitals/Pain Pain Assessment Pain Assessment: No/denies pain     Extremity/Trunk Assessment Upper Extremity Assessment Upper Extremity Assessment: Overall WFL for tasks assessed   Lower Extremity Assessment Lower Extremity Assessment: Defer to PT evaluation   Cervical / Trunk Assessment Cervical / Trunk Assessment: Normal   Communication Communication Communication: Impaired Factors Affecting Communication: Hearing impaired   Cognition Arousal: Alert Behavior During Therapy: WFL for tasks assessed/performed               OT - Cognition Comments: initially a little confused, but OT did wake him up.                 Following commands: Intact       Cueing  General Comments   Cueing Techniques: Verbal cues  VSS   Exercises     Shoulder Instructions  Home Living Family/patient expects to be discharged to:: Private residence Living Arrangements: Alone Available Help at Discharge: Family;Available PRN/intermittently Type of Home: House Home Access: Stairs to enter [employer] of Steps: 5 Entrance Stairs-Rails: None Home Layout: Multi-level Alternate Level Stairs-Number of Steps: 6 Alternate Level Stairs-Rails: Left Bathroom Shower/Tub: Producer, Television/film/video: Standard     Home Equipment: None          Prior Functioning/Environment Prior Level of Function : Independent/Modified Independent               ADLs Comments: still working with sons doing concerete work. Retired from Materials Engineer Problem List: Decreased activity tolerance;Cardiopulmonary status limiting activity   OT Treatment/Interventions:        OT Goals(Current goals can be found in the care  plan section)   Acute Rehab OT Goals Patient Stated Goal: get home today OT Goal Formulation: With patient Time For Goal Achievement: 04/04/24 Potential to Achieve Goals: Good   OT Frequency:       Co-evaluation              AM-PAC OT 6 Clicks Daily Activity     Outcome Measure Help from another person eating meals?: None Help from another person taking care of personal grooming?: None Help from another person toileting, which includes using toliet, bedpan, or urinal?: None Help from another person bathing (including washing, rinsing, drying)?: None Help from another person to put on and taking off regular upper body clothing?: None Help from another person to put on and taking off regular lower body clothing?: None 6 Click Score: 24   End of Session Equipment Utilized During Treatment: Gait belt Nurse Communication: Mobility status  Activity Tolerance: Patient tolerated treatment well Patient left: in bed;with call bell/phone within reach;with bed alarm set  OT Visit Diagnosis: Muscle weakness (generalized) (M62.81)                Time: 9184-9153 OT Time Calculation (min): 31 min Charges:  OT General Charges $OT Visit: 1 Visit OT Evaluation $OT Eval Low Complexity: 1 Low OT Treatments $Self Care/Home Management : 8-22 mins  Leita DEL OTR/L Acute Rehabilitation Services Office: [phone]  Leita PARAS [facility] 03/21/2024, 10:04 AM

## 2024-03-23 ENCOUNTER — Telehealth (HOSPITAL_COMMUNITY): Payer: Self-pay

## 2024-03-23 NOTE — Telephone Encounter (Signed)
 Called to confirm/remind patient of their appointment [facility] on 03/24/24 3:30.   Appointment:   [x] Confirmed  [] Left mess   [] No answer/No voice mail  [] VM Full/unable to leave message  [] Phone not in service  Patient reminded to bring all medications and/or complete list.  Confirmed patient has transportation. Gave directions, instructed to utilize valet parking.

## 2024-03-24 ENCOUNTER — Ambulatory Visit (HOSPITAL_COMMUNITY)
Admit: 2024-03-24 | Discharge: 2024-03-24 | Disposition: A | Discharge status: Home / Self Care | Source: Ambulatory Visit | Attending: Cardiology | Admitting: Cardiology

## 2024-03-24 ENCOUNTER — Encounter (HOSPITAL_COMMUNITY): Payer: Self-pay

## 2024-03-24 VITALS — BP 101/61 | HR 69 | Ht 71.0 in | Wt 162.4 lb

## 2024-03-24 DIAGNOSIS — Z7901 Long term (current) use of anticoagulants: Secondary | ICD-10-CM | POA: Diagnosis not present

## 2024-03-24 DIAGNOSIS — I428 Other cardiomyopathies: Secondary | ICD-10-CM | POA: Insufficient documentation

## 2024-03-24 DIAGNOSIS — I5022 Chronic systolic (congestive) heart failure: Secondary | ICD-10-CM | POA: Insufficient documentation

## 2024-03-24 DIAGNOSIS — I340 Nonrheumatic mitral (valve) insufficiency: Secondary | ICD-10-CM | POA: Diagnosis not present

## 2024-03-24 DIAGNOSIS — N1832 Chronic kidney disease, stage 3b: Secondary | ICD-10-CM | POA: Insufficient documentation

## 2024-03-24 DIAGNOSIS — R319 Hematuria, unspecified: Secondary | ICD-10-CM

## 2024-03-24 DIAGNOSIS — I272 Pulmonary hypertension, unspecified: Secondary | ICD-10-CM | POA: Diagnosis not present

## 2024-03-24 DIAGNOSIS — I361 Nonrheumatic tricuspid (valve) insufficiency: Secondary | ICD-10-CM | POA: Diagnosis not present

## 2024-03-24 DIAGNOSIS — I480 Paroxysmal atrial fibrillation: Secondary | ICD-10-CM | POA: Insufficient documentation

## 2024-03-24 DIAGNOSIS — I4892 Unspecified atrial flutter: Secondary | ICD-10-CM | POA: Insufficient documentation

## 2024-03-24 DIAGNOSIS — E7849 Other hyperlipidemia: Secondary | ICD-10-CM | POA: Diagnosis not present

## 2024-03-24 DIAGNOSIS — E1122 Type 2 diabetes mellitus with diabetic chronic kidney disease: Secondary | ICD-10-CM | POA: Diagnosis not present

## 2024-03-24 DIAGNOSIS — E785 Hyperlipidemia, unspecified: Secondary | ICD-10-CM | POA: Diagnosis not present

## 2024-03-24 DIAGNOSIS — Z79899 Other long term (current) drug therapy: Secondary | ICD-10-CM | POA: Insufficient documentation

## 2024-03-24 DIAGNOSIS — R339 Retention of urine, unspecified: Secondary | ICD-10-CM

## 2024-03-24 DIAGNOSIS — Z8249 Family history of ischemic heart disease and other diseases of the circulatory system: Secondary | ICD-10-CM | POA: Insufficient documentation

## 2024-03-24 LAB — LIPID PANEL
Cholesterol: 159 mg/dL (ref 0–200)
HDL: 56 mg/dL
LDL Cholesterol: 81 mg/dL (ref 0–99)
Total CHOL/HDL Ratio: 2.9 ratio
Triglycerides: 114 mg/dL
VLDL: 23 mg/dL (ref 0–40)

## 2024-03-24 LAB — BASIC METABOLIC PANEL WITH GFR
Anion gap: 11 (ref 5–15)
BUN: 29 mg/dL — ABNORMAL HIGH (ref 8–23)
CO2: 26 mmol/L (ref 22–32)
Calcium: 9 mg/dL (ref 8.9–10.3)
Chloride: 99 mmol/L (ref 98–111)
Creatinine, Ser: 2.48 mg/dL — ABNORMAL HIGH (ref 0.61–1.24)
GFR, Estimated: 25 mL/min — ABNORMAL LOW
Glucose, Bld: 198 mg/dL — ABNORMAL HIGH (ref 70–99)
Potassium: 4 mmol/L (ref 3.5–5.1)
Sodium: 136 mmol/L (ref 135–145)

## 2024-03-24 LAB — CBC
HCT: 40.3 % (ref 39.0–52.0)
Hemoglobin: 13.1 g/dL (ref 13.0–17.0)
MCH: 28.9 pg (ref 26.0–34.0)
MCHC: 32.5 g/dL (ref 30.0–36.0)
MCV: 88.8 fL (ref 80.0–100.0)
Platelets: 143 K/uL — ABNORMAL LOW (ref 150–400)
RBC: 4.54 MIL/uL (ref 4.22–5.81)
RDW: 14.1 % (ref 11.5–15.5)
WBC: 3.6 K/uL — ABNORMAL LOW (ref 4.0–10.5)
nRBC: 0 % (ref 0.0–0.2)

## 2024-03-24 LAB — PRO BRAIN NATRIURETIC PEPTIDE: Pro Brain Natriuretic Peptide: 9013 pg/mL — ABNORMAL HIGH

## 2024-03-24 MED ORDER — FUROSEMIDE 40 MG PO TABS: 40.0000 mg | ORAL_TABLET | Freq: Two times a day (BID) | ORAL | 0 refills | Status: AC

## 2024-03-24 NOTE — Progress Notes (Signed)
 "    HEART & VASCULAR TRANSITION OF CARE CONSULT NOTE   Referring Physician: Dr. Venora PCP: Charlott Dorn LABOR, MD  Cardiologist: Oneil Parchment, MD  HPI: Referred to clinic by Dr. Arrien for heart failure consultation.   Carlos Gibson is a 83 y.o. male with history of HFrEF d/t NICM s/p ICD, pulmonary hypertension, persistent Afib/flutter on warfarin, MR, T2DM, trifascicular block, and CKD 3b.  Longstanding heart failure with persistently low EF 25-35% with RV and diastolic dysfunction since 2016. He has followed with Dr. Parchment and Dr. Inocencio. Cath in 2016 without coronary disease and secondary pulmonary hypertension. CMR 9/19 without LGE, EF 34%. Underwent CRT-D placement in 10/19. Has remained relatively stable over the years with very few decompensations requiring hospitalizations. He has followed closely with [facility]. Underwent ICD replacement in 1/25 with Dr. Inocencio. S/p DCCV 3/25. He was seen 8/25 in [facility], DCCV was deferred d/t hematuria. He converted to AV paced rhythm by visit 9/25 and had been feeling better.   He was recently admitted 03/17/24 with a/c CHF. He had progressive dyspnea over the past 2 weeks. He was diuresed with IV Lasix  c/b urinary retention. He was discharge with foley with plans to follow up on voiding trial with Urology. He was continues on his GDMT. Weight at discharge 72.6 kg.  Today he  presents for transition of care visit. Overall feeling well. He has accidentally gone to the new building across the street and notes that he was not short of breath walking over here. No CP or palpitations since discharge. Does have some dizziness, but reports that it is getting better every day. Able to perform ADLs. Appetite okay. Compliant with all medications.   Past Medical History:  Diagnosis Date   Aortic regurgitation    a. mild AI by echo 01/2016.   Aortic root dilation    Aortic valve regurgitation    Bifascicular block    Chronic systolic CHF  (congestive heart failure) (HCC)    CKD (chronic kidney disease), stage III (HCC) 01/01/2016   Diabetes (HCC)    Ejection fraction < 50%    Frequent PVCs    Gilbert's syndrome    Hyperlipidemia    Mild diastolic dysfunction    Mitral regurgitation    a. mod by echo 10/207.   NICM (nonischemic cardiomyopathy) (HCC)    Pericardial effusion    a. small by echo 01/2016.   Pulmonary hypertension (HCC)    Right BBB/left post fasc block    Thrombocytopenia     Current Outpatient Medications  Medication Sig Dispense Refill   alfuzosin  (UROXATRAL ) 10 MG 24 hr tablet Take 10 mg by mouth daily.     amiodarone  (PACERONE ) 200 MG tablet Take 1 tablet (200 mg total) by mouth daily. 30 tablet 11   atorvastatin  (LIPITOR) 10 MG tablet Take 10 mg by mouth in the morning.     bisoprolol  (ZEBETA ) 5 MG tablet Take 1 tablet (5 mg total) by mouth daily. 90 tablet 1   cholecalciferol  (VITAMIN D3) 25 MCG (1000 UNIT) tablet Take 1,000 Units by mouth daily. (Patient taking differently: Take 1,000 Units by mouth every other day.)     cyanocobalamin  (VITAMIN B12) 1000 MCG tablet Take 1,000 mcg by mouth daily. (Patient taking differently: Take 1,000 mcg by mouth every other day.)     finasteride  (PROSCAR ) 5 MG tablet Take 5 mg by mouth in the morning.     furosemide  (LASIX ) 40 MG tablet Take 1 tablet (40 mg  total) by mouth 2 (two) times daily. In case of weight gain 2 to 3 lbs in 24 hrs or 5 lbs in 7 days, take 1 tablet twice daily 60 tablet 0   hydrALAZINE  (APRESOLINE ) 25 MG tablet Take 25 mg by mouth 2 (two) times daily.     isosorbide  mononitrate (IMDUR ) 30 MG 24 hr tablet Take 30 mg by mouth daily.     warfarin (COUMADIN ) 5 MG tablet TAKE 1/2 TO 1 TABLET BY MOUTH ONCE DAILY AS DIRECTED by anticoagulation clinic (Patient taking differently: Take 2.5-5 mg by mouth at bedtime. 5 mg (5 mg x 1) every Tue, Thu, Sat; 2.5 mg (5 mg x 0.5) all other days) 30 tablet 5   No current facility-administered medications for this  encounter.    Allergies[1]  Social History   Socioeconomic History   Marital status: Widowed    Spouse name: Not on file   Number of children: 2   Years of education: Not on file   Highest education level: High school graduate  Occupational History   Occupation: retired  Tobacco Use   Smoking status: Never    Passive exposure: Past   Smokeless tobacco: Never   Tobacco comments:    Never smoked 05/17/23  Vaping Use   Vaping status: Never Used  Substance and Sexual Activity   Alcohol use: No    Alcohol/week: 0.0 standard drinks of alcohol   Drug use: No   Sexual activity: Not on file  Other Topics Concern   Not on file  Social History Narrative   Not on file   Social Drivers of Health   Tobacco Use: Low Risk (03/24/2024)   Patient History    Smoking Tobacco Use: Never    Smokeless Tobacco Use: Never    Passive Exposure: Past  Financial Resource Strain: Low Risk (03/20/2024)   Overall Financial Resource Strain (CARDIA)    Difficulty of Paying Living Expenses: Not very hard  Food Insecurity: No Food Insecurity (03/18/2024)   Epic    Worried About Programme Researcher, Broadcasting/film/video in the Last Year: Never true    Ran Out of Food in the Last Year: Never true  Transportation Needs: No Transportation Needs (03/18/2024)   Epic    Lack of Transportation (Medical): No    Lack of Transportation (Non-Medical): No  Physical Activity: Not on file  Stress: Not on file  Social Connections: Moderately Integrated (03/18/2024)   Social Connection and Isolation Panel    Frequency of Communication with Friends and Family: More than three times a week    Frequency of Social Gatherings with Friends and Family: More than three times a week    Attends Religious Services: More than 4 times per year    Active Member of [employer] or Organizations: Yes    Attends Banker Meetings: 1 to 4 times per year    Marital Status: Widowed  Intimate Partner Violence: Not At Risk (03/18/2024)   Epic     Fear of Current or Ex-Partner: No    Emotionally Abused: No    Physically Abused: No    Sexually Abused: No  Depression (PHQ2-9): Not on file  Alcohol Screen: Low Risk (03/20/2024)   Alcohol Screen    Last Alcohol Screening Score (AUDIT): 0  Housing: Unknown (03/20/2024)   Epic    Unable to Pay for Housing in the Last Year: No    Number of Times Moved in the Last Year: Not on file    Homeless in  the Last Year: No  Utilities: Not At Risk (03/18/2024)   Epic    Threatened with loss of utilities: No  Health Literacy: Not on file    Family History  Problem Relation Age of Onset   Hypertension Mother    Stroke Mother    Irregular heart beat Father    Unexplained death Father    Vitals:   04/02/2024 1603  BP: 101/61  Pulse: 69  SpO2: 95%  Weight: 73.7 kg (162 lb 6.4 oz)  Height: 5' 11 (1.803 m)    Filed Weights   02-Apr-2024 1603  Weight: 73.7 kg (162 lb 6.4 oz)    PHYSICAL EXAM: General: Elderly appearing. No distress  Cardiac: JVP flat. No murmurs  Extremities: Warm and dry.  No edema.  Neuro: A&O x3. Affect pleasant.   MDT Interrogation (personally reviewed): 0.0% AF burden since 03/18/24, 0 VT/VF, 0.6 h/d activity, Optivol down.  ASSESSMENT & PLAN:  Chronic Biventricular HFrEF, NICM - Long standing history of reduced EF and diastolic dysfunction. EF has been stable 25-30% since at least 2016. - CMR 9/19: LVEF 34%, no LGE, RVEF 32%, moderate MR - Most recent echo 12/25: EF 25-30%, RWMAs, G2DD, septal flattening, mild RV dysfunction, BAE, mod/sev MR - s/p MDT BiV CRT-D 10/19. S/p gen change 1/25. - NYHA II. Euvolemic to dry on exam. BMET/proBNP - taking lasix  40 mg bid; continue - GDMT: ? blocker: continue bisoprolol  5 mg daily ARB & MRA: contraindicated with CKD SGLT2i: hold with foley and urinary retenison Vasodilators: continue imdur  30 mg daily + hydral 25 mg tid - not a candidate for advanced therapies with advanced age and CKD  Paroxysmal Afib/Flutter -  Regular on exam; No AF on device interrogation - follows closely with [facility] (Dr. Inocencio) - continue amio 200 mg bid - continue warfarin  MR/TR - mod to sev MR and mod TR on last echo - no murmur on exam  CKD 3b - baseline Cr 2.2-2.4 - BMET today  HLD - continue atorvastatin  10 mg daily - lipid panel today  Urinary Retention, BPH, Hematuria - has foley - follow up appointment with Urology next week for voiding trial - reports hematuria in foley bag; on warfarin - CBC today  Referred to HFSW: No Refer to Pharmacy: No Refer to Home Health: No Refer to Advanced Heart Failure Clinic: No  Refer to General Cardiology: Yes, established with [facility], Dr. Jeffrie  Follow up with [facility]. He is an established patient of Dr. Jeffrie.  Kaylib Furness, NP 04-02-24     [1]  Allergies Allergen Reactions   Ace Inhibitors Cough   Coreg  [Carvedilol ] Other (See Comments)    feels bad   Metformin  Hcl Diarrhea   Sulfa Antibiotics Other (See Comments)    Patient cannot recall symptoms   Toprol  Xl [Metoprolol  Tartrate] Other (See Comments)    feels bad   "

## 2024-03-24 NOTE — Patient Instructions (Signed)
 Medication Changes:  Continue taking Furosemide  40 mg Twice daily   Lab Work:  Labs done today, your results will be available in MyChart, we will contact you for abnormal readings.   Special Instructions // Education:  Do the following things EVERYDAY: Weigh yourself in the morning before breakfast. Write it down and keep it in a log. Take your medicines as prescribed Eat low salt foods--Limit salt (sodium) to 2000 mg per day.  Stay as active as you can everyday Limit all fluids for the day to less than 2 liters   Follow-Up in: Thank you for allowing us  to provider your heart failure care after your recent hospitalization. Please follow-up with Cardiology as scheduled 04/13/24   If you have any questions, issues, or concerns before your next appointment please call our office at [phone], opt. 2 and leave a message for the triage nurse.

## 2024-03-27 ENCOUNTER — Ambulatory Visit (HOSPITAL_COMMUNITY): Payer: Self-pay | Admitting: Cardiology

## 2024-03-31 ENCOUNTER — Ambulatory Visit: Admitting: Physician Assistant

## 2024-04-12 NOTE — Progress Notes (Signed)
 " Cardiology Office Note   Date:  04/13/2024  ID:  ULES MARSALA, DOB 1940/09/15, MRN [id number] PCP: Charlott Dorn LABOR, MD  [facility] Providers Cardiologist:  Oneil Parchment, MD Cardiology APP:  Janene Boer, [place]  Electrophysiologist:  Will Gladis Norton, MD    History of Present Illness Carlos Gibson is a 84 y.o. male with a past medical history significant for heart failure status post AICD implantation, atrial fibrillation, chronic kidney disease stage IIIb, diabetes mellitus, pulmonary hypertension, hypertension, dyslipidemia who presented to the ED with dyspnea.  Patient reported progressive worsening dyspnea for last 2 weeks prior to admission and also orthopnea and lower extremity edema.  Positive dietary indiscretion over the holidays.  Initial physical examination with blood pressure of 115/67, heart rate 69, RR 17 and O2 saturation 98%  Today, he presents with a hx of heart failure and shortness of breath.  He has had shortness of breath that is improved compared with before his heart failure hospitalization two weeks ago. His furosemide  was increased from 40 mg once daily to 40 mg twice daily. He has no foot swelling. He lost about five pounds in the hospital and has since gained about two pounds. He checks his weight daily, which has been stable around 161 to 162 pounds.  He has heart failure with reduced ejection fraction of 25-30% on echocardiogram from December 13th and has an ICD in place. He has not noticed irregular heartbeats.  He takes hydralazine  25 mg twice daily for blood pressure and has mild lightheadedness that has improved over the past two weeks.  He has kidney disease and plans follow up with his nephrologist.  His TSH has been elevated for four years.  His LDL is 81.  His A1c is elevated and he is not on diabetes medication.  Reports no shortness of breath nor dyspnea on exertion. Reports no chest pain, pressure, or tightness. No edema, orthopnea,  PND. Reports no palpitations.   Discussed the use of AI scribe software for clinical note transcription with the patient, who gave verbal consent to proceed.   ROS: pertinent ROS in HPI  Studies Reviewed     Echocardiogram 03/18/2024  Sonographer Comments: Suboptimal subcostal window.  IMPRESSIONS     1. Global hypokinesis with akinesis of the inferolateral wall; overall  severe LV dysfunction.   2. Left ventricular ejection fraction, by estimation, is 25 to 30%. The  left ventricle has severely decreased function. The left ventricle  demonstrates regional wall motion abnormalities (see scoring  diagram/findings for description). The left  ventricular internal cavity size was severely dilated. Left ventricular  diastolic parameters are consistent with Grade II diastolic dysfunction  (pseudonormalization). There is the interventricular septum is flattened  in systole and diastole, consistent  with right ventricular pressure and volume overload.   3. Right ventricular systolic function is mildly reduced. The right  ventricular size is normal. There is severely elevated pulmonary artery  systolic pressure.   4. Left atrial size was severely dilated.   5. Right atrial size was severely dilated.   6. Moderate pleural effusion in the left lateral region.   7. The mitral valve is normal in structure. Moderate to severe mitral  valve regurgitation. No evidence of mitral stenosis.   8. Tricuspid valve regurgitation is moderate.   9. The aortic valve is tricuspid. Aortic valve regurgitation is mild. No  aortic stenosis is present.  10. The inferior vena cava is normal in size with greater than 50%  respiratory variability, suggesting right atrial pressure of 3 mmHg.   FINDINGS   Left Ventricle: Left ventricular ejection fraction, by estimation, is 25  to 30%. The left ventricle has severely decreased function. The left  ventricle demonstrates regional wall motion abnormalities. The  left  ventricular internal cavity size was  severely dilated. There is no left ventricular hypertrophy. The  interventricular septum is flattened in systole and diastole, consistent  with right ventricular pressure and volume overload. Left ventricular  diastolic parameters are consistent with Grade  II diastolic dysfunction (pseudonormalization).   Right Ventricle: The right ventricular size is normal. Right ventricular  systolic function is mildly reduced. There is severely elevated pulmonary  artery systolic pressure. The tricuspid regurgitant velocity is 4.68 m/s,  and with an assumed right atrial  pressure of 15 mmHg, the estimated right ventricular systolic pressure is  102.6 mmHg.   Left Atrium: Left atrial size was severely dilated.   Right Atrium: Right atrial size was severely dilated.   Pericardium: Trivial pericardial effusion is present.   Mitral Valve: The mitral valve is normal in structure. Moderate to severe  mitral valve regurgitation. No evidence of mitral valve stenosis.   Tricuspid Valve: The tricuspid valve is normal in structure. Tricuspid  valve regurgitation is moderate . No evidence of tricuspid stenosis.   Aortic Valve: The aortic valve is tricuspid. Aortic valve regurgitation is  mild. No aortic stenosis is present.   Pulmonic Valve: The pulmonic valve was normal in structure. Pulmonic valve  regurgitation is mild. No evidence of pulmonic stenosis.   Aorta: The aortic root is normal in size and structure.   Venous: The inferior vena cava is normal in size with greater than 50%  respiratory variability, suggesting right atrial pressure of 3 mmHg.   IAS/Shunts: No atrial level shunt detected by color flow Doppler.   Additional Comments: Global hypokinesis with akinesis of the inferolateral  wall; overall severe LV dysfunction. A device lead is visualized. There is  a moderate pleural effusion in the left lateral region.      Risk  Assessment/Calculations  CHA2DS2-VASc Score = 5  This indicates a 7.2% annual risk of stroke. The patient's score is based upon: CHF History: 1 HTN History: 1 Diabetes History: 1 Stroke History: 0 Vascular Disease History: 0 Age Score: 2 Gender Score: 0  Physical Exam VS:  BP (!) 98/54   Pulse 70   Ht 5' 11 (1.803 m)   Wt 161 lb (73 kg)   SpO2 97%   BMI 22.45 kg/m        Wt Readings from Last 3 Encounters:  04/13/24 161 lb (73 kg)  03/24/24 162 lb 6.4 oz (73.7 kg)  03/21/24 160 lb 0.9 oz (72.6 kg)    GEN: Well nourished, well developed in no acute distress NECK: No JVD; No carotid bruits CARDIAC: RRR, no murmurs, rubs, gallops RESPIRATORY:  Clear to auscultation without rales, wheezing or rhonchi  ABDOMEN: Soft, non-tender, non-distended EXTREMITIES:  No edema; No deformity   ASSESSMENT AND PLAN  Chronic systolic heart failure with reduced ejection fraction (EF 25-30%) Chronic systolic heart failure with EF 25-30%, consistent with previous echocardiograms. Symptoms include shortness of breath, improved with increased furosemide  dosage. Weight stable with slight gain since hospitalization, likely due to muscle weight from nutritional supplementation. Blood pressure low, contributing to lightheadedness. - Continue furosemide  40 mg twice daily. - Monitor weight daily to assess fluid status. - Adjust hydralazine  to 10 mg twice daily after current supply is  finished to manage low blood pressure and lightheadedness.  Nonischemic cardiomyopathy Reduced ejection fraction, managed with heart failure medications. No recent episodes of irregular heartbeats reported. - Continue current heart failure medications including Imdur  and Coumadin . -Continue hydralazine  just at a reduced dose due to hypotension  Paroxysmal atrial fibrillation Managed with Coumadin . No recent episodes of irregular heartbeats reported. - Continue Coumadin  for anticoagulation.  Nonrheumatic mitral valve  regurgitation Noted on echocardiogram, contributing to shortness of breath. - Continue monitoring cardiac status and symptoms.  Stage 3b chronic kidney disease Noted with low kidney function. Requires follow-up with nephrologist. - Schedule appointment with nephrologist, Dr. Dennise, for further management.  Other hyperlipidemia LDL cholesterol slightly elevated at 81 mg/dL, above target for coronary artery disease. Triglycerides and HDL within normal limits. - Discuss lipid management with primary care provider.  Type 2 diabetes mellitus Elevated A1c, indicating poor glycemic control. No current diabetes medication noted. - Discuss diabetes management and potential initiation of medication with primary care provider.      Dispo: He can follow-up in 8 months with general cardiology  Signed, Orren LOISE Fabry, PA-C   "

## 2024-04-13 ENCOUNTER — Encounter: Payer: Self-pay | Admitting: Physician Assistant

## 2024-04-13 ENCOUNTER — Ambulatory Visit: Attending: Physician Assistant | Admitting: Physician Assistant

## 2024-04-13 VITALS — BP 98/54 | HR 70 | Ht 71.0 in | Wt 161.0 lb

## 2024-04-13 DIAGNOSIS — I428 Other cardiomyopathies: Secondary | ICD-10-CM | POA: Diagnosis not present

## 2024-04-13 DIAGNOSIS — E7849 Other hyperlipidemia: Secondary | ICD-10-CM | POA: Diagnosis not present

## 2024-04-13 DIAGNOSIS — Z79899 Other long term (current) drug therapy: Secondary | ICD-10-CM

## 2024-04-13 DIAGNOSIS — I5022 Chronic systolic (congestive) heart failure: Secondary | ICD-10-CM | POA: Diagnosis not present

## 2024-04-13 DIAGNOSIS — I480 Paroxysmal atrial fibrillation: Secondary | ICD-10-CM

## 2024-04-13 DIAGNOSIS — N1832 Chronic kidney disease, stage 3b: Secondary | ICD-10-CM

## 2024-04-13 DIAGNOSIS — R339 Retention of urine, unspecified: Secondary | ICD-10-CM | POA: Diagnosis not present

## 2024-04-13 DIAGNOSIS — I340 Nonrheumatic mitral (valve) insufficiency: Secondary | ICD-10-CM | POA: Diagnosis not present

## 2024-04-13 MED ORDER — HYDRALAZINE HCL 10 MG PO TABS: 10.0000 mg | ORAL_TABLET | Freq: Two times a day (BID) | ORAL | 5 refills | Status: AC

## 2024-04-13 NOTE — Patient Instructions (Addendum)
 Medication Instructions:  DECREASE Hydralazine  to 10mg  Take 1 tablet twice a day  *If you need a refill on your cardiac medications before your next appointment, please call your pharmacy*  Lab Work: HAVE YOUR PCP CHECK A TSH AND A1C If you have labs (blood work) drawn today and your tests are completely normal, you will receive your results only by: MyChart Message (if you have MyChart) OR A paper copy in the mail If you have any lab test that is abnormal or we need to change your treatment, we will call you to review the results.  Testing/Procedures: NONE ORDERED  Follow-Up: At [facility], you and your health needs are our priority.  As part of our continuing mission to provide you with exceptional heart care, our providers are all part of one team.  This team includes your primary Cardiologist (physician) and Advanced Practice Providers or APPs (Physician Assistants and Nurse Practitioners) who all work together to provide you with the care you need, when you need it.  Your next appointment:   8 month(s)  Provider:   Oneil Parchment, MD or Orren Fabry, PA   We recommend signing up for the patient portal called MyChart.  Sign up information is provided on this After Visit Summary.  MyChart is used to connect with patients for Virtual Visits (Telemedicine).  Patients are able to view lab/test results, encounter notes, upcoming appointments, etc.  Non-urgent messages can be sent to your provider as well.   To learn more about what you can do with MyChart, go to [email or web address].   Other Instructions CONTACT DR SINGH'S OFFICE AND SCHEDULE AN APPOINTMENT.   KEEP UPCOMING APPOINTMENTS WITH RENEE URSUY, PA AND CLINT FENTON, PA

## 2024-04-13 NOTE — Addendum Note (Signed)
 Addended by: SEBASTIAN, TEE Y on: 04/13/2024 03:27 PM   Modules accepted: Orders

## 2024-04-18 ENCOUNTER — Other Ambulatory Visit: Payer: Self-pay | Admitting: *Deleted

## 2024-04-18 DIAGNOSIS — I4819 Other persistent atrial fibrillation: Secondary | ICD-10-CM

## 2024-04-18 MED ORDER — WARFARIN SODIUM 5 MG PO TABS: ORAL_TABLET | ORAL | 1 refills | Status: AC

## 2024-04-18 NOTE — Telephone Encounter (Signed)
 Warfarin 5mg  Dx-[facility] Last INR Check-03/16/24 Last OV- 03/24/24

## 2024-04-21 ENCOUNTER — Encounter: Payer: Self-pay | Admitting: Cardiology

## 2024-04-27 ENCOUNTER — Ambulatory Visit

## 2024-05-02 NOTE — Progress Notes (Unsigned)
 " Cardiology Office Note:  .   Date:  05/02/2024  ID:  Carlos Gibson, DOB 1940/08/04, MRN [id number] PCP: Carlos Dorn LABOR, Carlos Gibson  [facility] Providers Cardiologist:  Carlos Parchment, Carlos Gibson Cardiology APP:  Carlos Gibson, Carlos Gibson  Electrophysiologist:  Carlos Gladis Norton, Carlos Gibson {  History of Present Illness: .   Carlos Gibson is a 84 y.o. male w/PMHx of  NICM, Chronic CHF (systolic), PVCs, conduction system disease (trifascicular block),  CKD (III), Gilbert's syndrome, DM ICD,  Afib/flutter  He saw AHF 03/24/24 team after a recent hospitalization with HF exacerbation earlier in Dec. He had gone to the [address] location > walked to CHF clinic without SOB/DOE Not felt to need referral to AHF clinic To c/w Carlos Gibson  He saw a cards team APP 04/13/24, minimal if any symptoms, reported no difficulties with his ADLs Hydralazine  dose adjusted with some lower BP readings  Today's visit is for 62mo device visit ROS:   *** device only *** last EKG is not great, others were better *** symptomatically doing well ***  optimize?  EF has been bad for a long time  Device information MDT CRT-D, implanted 04/28/2017, gen change 04/29/23  Arrhythmia/AAD hx AFib looks to go back to 2020 Amiodarone  started 2020  Studies Reviewed: Carlos Gibson    EKG done today and reviewed by myself ***  DEVICE interrogation done today and reviewed by myself *** battery and lead measurements are good ***  03/18/24: TTE 1. Global hypokinesis with akinesis of the inferolateral wall; overall  severe LV dysfunction.   2. Left ventricular ejection fraction, by estimation, is 25 to 30%. The  left ventricle has severely decreased function. The left ventricle  demonstrates regional wall motion abnormalities (see scoring  diagram/findings for description). The left  ventricular internal cavity size was severely dilated. Left ventricular  diastolic parameters are consistent with Grade II diastolic dysfunction   (pseudonormalization). There is the interventricular septum is flattened  in systole and diastole, consistent  with right ventricular pressure and volume overload.   3. Right ventricular systolic function is mildly reduced. The right  ventricular size is normal. There is severely elevated pulmonary artery  systolic pressure.   4. Left atrial size was severely dilated.   5. Right atrial size was severely dilated.   6. Moderate pleural effusion in the left lateral region.   7. The mitral valve is normal in structure. Moderate to severe mitral  valve regurgitation. No evidence of mitral stenosis.   8. Tricuspid valve regurgitation is moderate.   9. The aortic valve is tricuspid. Aortic valve regurgitation is mild. No  aortic stenosis is present.  10. The inferior vena cava is normal in size with greater than 50%  respiratory variability, suggesting right atrial pressure of 3 mmHg.      TTE 11/06/21   1. Akinesis of the inferior and inferolateral walls and hypokinesis of  remaining walls; overall severe LV dysfunction.   2. Left ventricular ejection fraction, by estimation, is 25 to 30%. The  left ventricle has severely decreased function. The left ventricle  demonstrates regional wall motion abnormalities (see scoring  diagram/findings for description). The left  ventricular internal cavity size was severely dilated. Left ventricular  diastolic parameters are consistent with Grade II diastolic dysfunction  (pseudonormalization). Elevated left atrial pressure.   3. Right ventricular systolic function is mildly reduced. The right  ventricular size is mildly enlarged.   4. Left atrial size was severely dilated.   5. Right  atrial size was moderately dilated.   6. A small pericardial effusion is present.   7. The mitral valve is normal in structure. Severe mitral valve  regurgitation. No evidence of mitral stenosis.   8. Tricuspid valve regurgitation is moderate.   9. The aortic valve  is tricuspid. Aortic valve regurgitation is moderate  to severe. No aortic stenosis is present.  10. Aortic dilatation noted. There is borderline dilatation of the aortic  root, measuring 39 mm.  11. The inferior vena cava is normal in size with greater than 50%  respiratory variability, suggesting right atrial pressure of 3 mmHg.    CMR 9/19: LVEF 34%, no LGE, RVEF 32%, moderate MR   Risk Assessment/Calculations:    Physical Exam:   VS:  There were no vitals taken for this visit.   Wt Readings from Last 3 Encounters:  04/13/24 161 lb (73 kg)  03/24/24 162 lb 6.4 oz (73.7 kg)  03/21/24 160 lb 0.9 oz (72.6 kg)    GEN: Well nourished, well developed in no acute distress NECK: No JVD; No carotid bruits CARDIAC:  *** RRR, no murmurs, rubs, gallops RESPIRATORY: *** CTA b/l without rales, wheezing or rhonchi  ABDOMEN: Soft, non-tender, non-distended EXTREMITIES: *** No edema; No deformity   *** ICD site: is stable, no thinning, fluctuation, tethering  ASSESSMENT AND PLAN: .    CRT-D *** intact function *** no programming changes made  NICM Chronic CHF LVEF remains low 25-30% by his echo Dec 2025 *** Volume stable by exam and OptiVol *** On BB, diuretics, nitrate, hydralazine   Cough w/ACE C/w Carlos Gibson   persistent AFib/flutter CHA2DS2Vasc is 4, on warfarin, managed by his PMD ***   Secondary hypercoagulable state 2/2 AFib     Dispo: usual post procedure f/u  Signed, Carlos Macario Arthur, PA-C   "

## 2024-05-04 ENCOUNTER — Ambulatory Visit: Payer: Self-pay | Admitting: Cardiology

## 2024-05-04 ENCOUNTER — Ambulatory Visit: Attending: Cardiology | Admitting: Cardiology

## 2024-05-04 ENCOUNTER — Encounter: Payer: Self-pay | Admitting: Cardiology

## 2024-05-04 ENCOUNTER — Ambulatory Visit: Admitting: *Deleted

## 2024-05-04 VITALS — BP 118/68 | Ht 71.0 in | Wt 163.0 lb

## 2024-05-04 DIAGNOSIS — I483 Typical atrial flutter: Secondary | ICD-10-CM

## 2024-05-04 DIAGNOSIS — R7989 Other specified abnormal findings of blood chemistry: Secondary | ICD-10-CM

## 2024-05-04 DIAGNOSIS — I4819 Other persistent atrial fibrillation: Secondary | ICD-10-CM

## 2024-05-04 DIAGNOSIS — Z5181 Encounter for therapeutic drug level monitoring: Secondary | ICD-10-CM

## 2024-05-04 DIAGNOSIS — I5022 Chronic systolic (congestive) heart failure: Secondary | ICD-10-CM | POA: Diagnosis not present

## 2024-05-04 DIAGNOSIS — Z7901 Long term (current) use of anticoagulants: Secondary | ICD-10-CM

## 2024-05-04 DIAGNOSIS — I428 Other cardiomyopathies: Secondary | ICD-10-CM

## 2024-05-04 DIAGNOSIS — I452 Bifascicular block: Secondary | ICD-10-CM

## 2024-05-04 LAB — CUP PACEART INCLINIC DEVICE CHECK
Battery Remaining Longevity: 80 mo
Battery Voltage: 2.98 V
Brady Statistic AP VP Percent: 99.04 %
Brady Statistic AP VS Percent: 0.19 %
Brady Statistic AS VP Percent: 0.43 %
Brady Statistic AS VS Percent: 0.34 %
Brady Statistic RA Percent Paced: 99.51 %
Brady Statistic RV Percent Paced: 99.47 %
Date Time Interrogation Session: 20260129121109
HighPow Impedance: 51 Ohm
Implantable Lead Connection Status: 753985
Implantable Lead Connection Status: 753985
Implantable Lead Connection Status: 753985
Implantable Lead Implant Date: 20191023
Implantable Lead Implant Date: 20191023
Implantable Lead Implant Date: 20191023
Implantable Lead Location: 753858
Implantable Lead Location: 753859
Implantable Lead Location: 753860
Implantable Lead Model: 4598
Implantable Lead Model: 5076
Implantable Lead Model: 6935
Implantable Pulse Generator Implant Date: 20250123
Lead Channel Impedance Value: 247 Ohm
Lead Channel Impedance Value: 323 Ohm
Lead Channel Impedance Value: 342 Ohm
Lead Channel Impedance Value: 342 Ohm
Lead Channel Impedance Value: 361 Ohm
Lead Channel Impedance Value: 399 Ohm
Lead Channel Impedance Value: 589 Ohm
Lead Channel Impedance Value: 646 Ohm
Lead Channel Impedance Value: 684 Ohm
Lead Channel Impedance Value: 703 Ohm
Lead Channel Impedance Value: 760 Ohm
Lead Channel Impedance Value: 893 Ohm
Lead Channel Impedance Value: 912 Ohm
Lead Channel Pacing Threshold Amplitude: 0.625 V
Lead Channel Pacing Threshold Amplitude: 0.75 V
Lead Channel Pacing Threshold Amplitude: 1 V
Lead Channel Pacing Threshold Amplitude: 1 V
Lead Channel Pacing Threshold Amplitude: 1.375 V
Lead Channel Pacing Threshold Pulse Width: 0.4 ms
Lead Channel Pacing Threshold Pulse Width: 0.4 ms
Lead Channel Pacing Threshold Pulse Width: 0.4 ms
Lead Channel Pacing Threshold Pulse Width: 0.4 ms
Lead Channel Pacing Threshold Pulse Width: 0.8 ms
Lead Channel Sensing Intrinsic Amplitude: 2.1 mV
Lead Channel Sensing Intrinsic Amplitude: 7.8 mV
Lead Channel Setting Pacing Amplitude: 1.5 V
Lead Channel Setting Pacing Amplitude: 2 V
Lead Channel Setting Pacing Amplitude: 2.5 V
Lead Channel Setting Pacing Pulse Width: 0.4 ms
Lead Channel Setting Pacing Pulse Width: 0.8 ms
Lead Channel Setting Sensing Sensitivity: 0.3 mV
Zone Setting Status: 755011
Zone Setting Status: 755011

## 2024-05-04 LAB — POCT INR: POC INR: 3.8

## 2024-05-04 NOTE — Progress Notes (Signed)
 Lab Results  Component Value Date   INR 3.8 05/04/2024   INR 2.8 (H) 03/21/2024   INR 3.1 (H) 03/20/2024    Description    INR: 3.8;  Hold warfarin today and then continue taking warfarin 1/2 tablet daily except for 1 tablet on Tuesdays, Thursdays, and Saturdays.  Recheck INR in 3 weeks. Stay consistent with greens each week. Coumadin  Clinic [phone].

## 2024-05-04 NOTE — Progress Notes (Signed)
 " Electrophysiology Office Note:   ID:  Shafin, Pollio 05/31/1940, MRN [id number]  Primary Cardiologist: Oneil Parchment, MD Electrophysiologist: Soyla Gladis Norton, MD      History of Present Illness:   Carlos Gibson is a 84 y.o. male with h/o chronic systolic CH (nonischemic cardiomyopathy), conduction system disease (trifascicular block), CKD, Gilbert's syndrome, ICD, Afib/flutter, DM  seen today for routine electrophysiology followup.   Prior to generator change-out last year, patient's device had noted persistent episode of afib beginning 03/22/23. This episode continued through generator change-out and DCCV scheduled for March 2025. At April 2025 clinic follow up was in an AV dual paced rhythm. He was seen in clinic 11/10/23 with persistent atrial flutter starting 10/15/23. DCCV deferred at that time due to hematuria. He was found back in AV dual paced rhythm in afib clinic on 12/15/23.  Patient was noted 03/17/2024 with acute on chronic CHF following a 2-week period of increased dyspnea.  He was diuresed with IV Lasix  during his admission though this was complicated by urinary retention.  Patient ultimately discharged with a Foley catheter and plans to follow-up with urology for voiding trial.  Patient was seen 03/24/2024 and heart failure transition of care clinic and reported that he had been feeling better following his discharge from the hospital.  Patient's guideline directed medical therapy was continued with the exception of his SGLT2 which was held in the setting of Foley catheter and urinary retention.  Per heart failure clinic notes, patient not a candidate for advanced therapies with advanced age and CKD.  Patient saw general cardiology for follow-up on 04/13/2024 and felt that he was doing well overall.  Reported generally stable weight.  Since last being seen in our clinic the patient reports doing well.  he denies chest pain, palpitations, dyspnea, PND, orthopnea, nausea, vomiting,  dizziness, syncope, edema, weight gain, or early satiety. He continues to work putting in concrete.  Review of systems complete and found to be negative unless listed in HPI.   EP Information / Studies Reviewed:    EKG is ordered today. Personal review as below.  EKG Interpretation Date/Time:  Thursday May 04 2024 08:59:40 EST Ventricular Rate:  70 PR Interval:  208 QRS Duration:  166 QT Interval:  510 QTC Calculation: 550 R Axis:   -80  Text Interpretation: AV dual-paced rhythm When compared with ECG of 17-Mar-2024 16:52, Vent. rate has decreased BY   4 BPM Confirmed by Trudy Birmingham [phone]) on 05/04/2024 9:05:07 AM    ICD Interrogation-  reviewed in detail today,  See PACEART report.  Arrhythmia/Device History  S/P Medtronic CRT-D implanted 01/26/2018 BiV generator change-out 04/29/23   AAD Hx: Amiodarone  started 01/24/2019 for PVCs and afib   Physical Exam:   VS:  Ht 5' 11 (1.803 m)   Wt 163 lb (73.9 kg)   SpO2 99%   BMI 22.73 kg/m    Wt Readings from Last 3 Encounters:  05/04/24 163 lb (73.9 kg)  04/13/24 161 lb (73 kg)  03/24/24 162 lb 6.4 oz (73.7 kg)     GEN: No acute distress  NECK: No JVD CARDIAC: Regular rate and rhythm, no murmurs, rubs, gallops RESPIRATORY:  Clear to auscultation without rales, wheezing or rhonchi  ABDOMEN: Soft, non-tender, non-distended EXTREMITIES:  No edema; No deformity   ASSESSMENT AND PLAN:    Chronic systolic CHF  s/p Medtronic CRT-D  euvolemic appearing today. Stable thoracic impedance. Stable on an appropriate medical regimen Normal ICD function with stable appearing  ECG/QRS. 2 V sensed episodes noted (no EGM), longest 22 seconds. No afib since last remote. Noted blunted histogram today though patient without symptoms and is very physically active. Would consider rate response if fatigue noted.  See Elisabeth Art report No changes today Noted prominent wire/header on physical exam today (see media for picture). No thinning  or erosion. Patient asked to notify if he sees redness or blistering of the skin.   Persistent atrial fibrillation Secondary hypercoagulable state No recurrent afib since ~September. Continue Amiodarone  200mg  daily. Continue Warfarin (INR check today).   Elevated TSH Patient with chronically elevated TSH, most recently 10.661. Follow up with PCP has been encouraged but has not occurred. Will check thyroid  panel today, patient may need referral to endocrinology.   Hypertension Blood pressure is well-controlled on current antihypertensive regimen. Continue current medications and dosing.    Disposition:   Follow up with EP Team in 6 months   Signed, Artist Pouch, PA-C  "

## 2024-05-04 NOTE — Patient Instructions (Signed)
 Description    INR: 3.8;  Hold warfarin today and then continue taking warfarin 1/2 tablet daily except for 1 tablet on Tuesdays, Thursdays, and Saturdays.  Recheck INR in 3 weeks. Stay consistent with greens each week. Coumadin  Clinic [phone].

## 2024-05-04 NOTE — Patient Instructions (Addendum)
 Medication Instructions:   Your physician recommends that you continue on your current medications as directed. Please refer to the Current Medication list given to you today.   *If you need a refill on your cardiac medications before your next appointment, please call your pharmacy*   Lab Work:   PLEASE GO DOWN STAIRS  [facility]  FIRST FLOOR   ( GET OFF ELEVATORS WALK TOWARDS WAITING AREA LAB LOCATED BY PHARMACY):  HEPATIC AND THYROID  PANEL       If you have labs (blood work) drawn today and your tests are completely normal, you will receive your results only by: MyChart Message (if you have MyChart) OR A paper copy in the mail If you have any lab test that is abnormal or we need to change your treatment, we will call you to review the results.   Testing/Procedures:  NONE ORDERED  TODAY    Follow-Up: At [facility], you and your health needs are our priority.  As part of our continuing mission to provide you with exceptional heart care, our providers are all part of one team.  This team includes your primary Cardiologist (physician) and Advanced Practice Providers or APPs (Physician Assistants and Nurse Practitioners) who all work together to provide you with the care you need, when you need it.  Your next appointment:   6 month(s) Provider:  Artist Pouch, PA-C   We recommend signing up for the patient portal called MyChart.  Sign up information is provided on this After Visit Summary.  MyChart is used to connect with patients for Virtual Visits (Telemedicine).  Patients are able to view lab/test results, encounter notes, upcoming appointments, etc.  Non-urgent messages can be sent to your provider as well.   To learn more about what you can do with MyChart, go to [email or web address].   Other Instructions

## 2024-05-05 ENCOUNTER — Ambulatory Visit: Payer: Medicare Other

## 2024-05-05 ENCOUNTER — Ambulatory Visit: Payer: Self-pay | Admitting: Cardiology

## 2024-05-05 DIAGNOSIS — I483 Typical atrial flutter: Secondary | ICD-10-CM

## 2024-05-05 LAB — CUP PACEART REMOTE DEVICE CHECK
Battery Remaining Longevity: 79 mo
Battery Voltage: 2.97 V
Brady Statistic RA Percent Paced: 99.12 %
Brady Statistic RV Percent Paced: 99.54 %
Date Time Interrogation Session: 20260130002234
HighPow Impedance: 54 Ohm
Implantable Lead Connection Status: 753985
Implantable Lead Connection Status: 753985
Implantable Lead Connection Status: 753985
Implantable Lead Implant Date: 20191023
Implantable Lead Implant Date: 20191023
Implantable Lead Implant Date: 20191023
Implantable Lead Location: 753858
Implantable Lead Location: 753859
Implantable Lead Location: 753860
Implantable Lead Model: 4598
Implantable Lead Model: 5076
Implantable Lead Model: 6935
Implantable Pulse Generator Implant Date: 20250123
Lead Channel Impedance Value: 247 Ohm
Lead Channel Impedance Value: 323 Ohm
Lead Channel Impedance Value: 323 Ohm
Lead Channel Impedance Value: 361 Ohm
Lead Channel Impedance Value: 380 Ohm
Lead Channel Impedance Value: 380 Ohm
Lead Channel Impedance Value: 570 Ohm
Lead Channel Impedance Value: 646 Ohm
Lead Channel Impedance Value: 665 Ohm
Lead Channel Impedance Value: 684 Ohm
Lead Channel Impedance Value: 741 Ohm
Lead Channel Impedance Value: 855 Ohm
Lead Channel Impedance Value: 874 Ohm
Lead Channel Pacing Threshold Amplitude: 0.625 V
Lead Channel Pacing Threshold Amplitude: 1 V
Lead Channel Pacing Threshold Amplitude: 1.375 V
Lead Channel Pacing Threshold Pulse Width: 0.4 ms
Lead Channel Pacing Threshold Pulse Width: 0.4 ms
Lead Channel Pacing Threshold Pulse Width: 0.8 ms
Lead Channel Sensing Intrinsic Amplitude: 2.1 mV
Lead Channel Sensing Intrinsic Amplitude: 8 mV
Lead Channel Setting Pacing Amplitude: 1.5 V
Lead Channel Setting Pacing Amplitude: 2 V
Lead Channel Setting Pacing Amplitude: 2.5 V
Lead Channel Setting Pacing Pulse Width: 0.4 ms
Lead Channel Setting Pacing Pulse Width: 0.8 ms
Lead Channel Setting Sensing Sensitivity: 0.3 mV
Zone Setting Status: 755011
Zone Setting Status: 755011

## 2024-05-05 LAB — HEPATIC FUNCTION PANEL
ALT: 16 [IU]/L (ref 0–44)
AST: 14 [IU]/L (ref 0–40)
Albumin: 3.8 g/dL (ref 3.7–4.7)
Alkaline Phosphatase: 82 [IU]/L (ref 48–129)
Bilirubin Total: 1.4 mg/dL — ABNORMAL HIGH (ref 0.0–1.2)
Bilirubin, Direct: 0.45 mg/dL — ABNORMAL HIGH (ref 0.00–0.40)
Total Protein: 6.4 g/dL (ref 6.0–8.5)

## 2024-05-05 LAB — THYROID PANEL WITH TSH
Free Thyroxine Index: 2.4 (ref 1.2–4.9)
T3 Uptake Ratio: 32 % (ref 24–39)
T4, Total: 7.4 ug/dL (ref 4.5–12.0)
TSH: 8.69 u[IU]/mL — ABNORMAL HIGH (ref 0.450–4.500)

## 2024-05-08 ENCOUNTER — Ambulatory Visit

## 2024-05-12 NOTE — Progress Notes (Signed)
 Remote ICD Transmission

## 2024-05-25 ENCOUNTER — Ambulatory Visit

## 2024-06-13 ENCOUNTER — Ambulatory Visit (HOSPITAL_COMMUNITY): Admitting: Physician Assistant

## 2024-06-30 ENCOUNTER — Ambulatory Visit: Admitting: Cardiology
